# Patient Record
Sex: Female | Born: 1975 | Race: Black or African American | Hispanic: No | Marital: Single | Smoking: Current every day smoker
Health system: Southern US, Community
[De-identification: ages and names within clinical notes are randomized; demographics above are authoritative.]

## PROBLEM LIST (undated history)

## (undated) VITALS — BP 110/80 | HR 79 | Temp 97.7°F | Resp 20

## (undated) DIAGNOSIS — N179 Acute kidney failure, unspecified: Secondary | ICD-10-CM

## (undated) DIAGNOSIS — E119 Type 2 diabetes mellitus without complications: Secondary | ICD-10-CM

## (undated) DIAGNOSIS — F209 Schizophrenia, unspecified: Secondary | ICD-10-CM

## (undated) DIAGNOSIS — F319 Bipolar disorder, unspecified: Secondary | ICD-10-CM

## (undated) DIAGNOSIS — I10 Essential (primary) hypertension: Secondary | ICD-10-CM

## (undated) HISTORY — DX: Acute kidney failure, unspecified: N17.9

---
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
## 2004-09-05 ENCOUNTER — Emergency Department (HOSPITAL_COMMUNITY): Admission: EM | Admit: 2004-09-05 | Discharge: 2004-09-05 | Payer: Self-pay | Admitting: Emergency Medicine

## 2006-05-11 ENCOUNTER — Ambulatory Visit: Payer: Self-pay | Admitting: Internal Medicine

## 2019-04-24 ENCOUNTER — Inpatient Hospital Stay (HOSPITAL_COMMUNITY)
Admission: AD | Admit: 2019-04-24 | Discharge: 2019-05-02 | DRG: 885 | Disposition: A | Discharge status: Home / Self Care | Payer: Medicare Other | Attending: Emergency Medicine | Admitting: Emergency Medicine

## 2019-04-24 DIAGNOSIS — F200 Paranoid schizophrenia: Secondary | ICD-10-CM | POA: Diagnosis not present

## 2019-04-24 DIAGNOSIS — I10 Essential (primary) hypertension: Secondary | ICD-10-CM | POA: Diagnosis present

## 2019-04-24 DIAGNOSIS — F209 Schizophrenia, unspecified: Secondary | ICD-10-CM | POA: Diagnosis not present

## 2019-04-24 DIAGNOSIS — F319 Bipolar disorder, unspecified: Secondary | ICD-10-CM | POA: Diagnosis present

## 2019-04-24 DIAGNOSIS — E119 Type 2 diabetes mellitus without complications: Secondary | ICD-10-CM

## 2019-04-24 DIAGNOSIS — Z20822 Contact with and (suspected) exposure to covid-19: Secondary | ICD-10-CM | POA: Diagnosis present

## 2019-04-24 DIAGNOSIS — E669 Obesity, unspecified: Secondary | ICD-10-CM

## 2019-04-24 DIAGNOSIS — F201 Disorganized schizophrenia: Secondary | ICD-10-CM | POA: Diagnosis not present

## 2019-04-24 DIAGNOSIS — R739 Hyperglycemia, unspecified: Secondary | ICD-10-CM

## 2019-04-24 DIAGNOSIS — Z794 Long term (current) use of insulin: Secondary | ICD-10-CM

## 2019-04-24 DIAGNOSIS — Z9114 Patient's other noncompliance with medication regimen: Secondary | ICD-10-CM

## 2019-04-24 DIAGNOSIS — E1165 Type 2 diabetes mellitus with hyperglycemia: Secondary | ICD-10-CM | POA: Diagnosis present

## 2019-04-24 DIAGNOSIS — E1169 Type 2 diabetes mellitus with other specified complication: Secondary | ICD-10-CM

## 2019-04-24 LAB — RESPIRATORY PANEL BY RT PCR (FLU A&B, COVID)
Influenza A by PCR: NEGATIVE
Influenza B by PCR: NEGATIVE
SARS Coronavirus 2 by RT PCR: NEGATIVE

## 2019-04-24 MED ORDER — DIPHENHYDRAMINE HCL 25 MG PO CAPS: 50.0000 mg | ORAL_CAPSULE | Freq: Once | ORAL | Status: AC

## 2019-04-24 MED ORDER — HALOPERIDOL 5 MG PO TABS: 5.0000 mg | ORAL_TABLET | Freq: Once | ORAL | Status: AC

## 2019-04-24 MED ORDER — DIPHENHYDRAMINE HCL 50 MG/ML IJ SOLN
50.0000 mg | Freq: Once | INTRAMUSCULAR | Status: AC
  Administered 2019-04-24: 50 mg via INTRAMUSCULAR
  Filled 2019-04-24: qty 1

## 2019-04-24 MED ORDER — LORAZEPAM 2 MG/ML IJ SOLN
2.0000 mg | Freq: Once | INTRAMUSCULAR | Status: AC
  Administered 2019-04-24: 2 mg via INTRAMUSCULAR
  Filled 2019-04-24: qty 1

## 2019-04-24 MED ORDER — HALOPERIDOL LACTATE 5 MG/ML IJ SOLN
5.0000 mg | Freq: Once | INTRAMUSCULAR | Status: AC
  Administered 2019-04-24: 5 mg via INTRAMUSCULAR
  Filled 2019-04-24: qty 1

## 2019-04-24 MED ORDER — LORAZEPAM 1 MG PO TABS: 2.0000 mg | ORAL_TABLET | Freq: Once | ORAL | Status: AC
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
## 2019-04-24 NOTE — Progress Notes (Signed)
Pt very agitated with staff, cursing and yelling at staff, undirectable to return to room.  NP Rashaun Dixon notified.

## 2019-04-25 ENCOUNTER — Other Ambulatory Visit: Payer: Self-pay

## 2019-04-25 ENCOUNTER — Encounter (HOSPITAL_COMMUNITY): Payer: Self-pay | Admitting: Psychiatric/Mental Health

## 2019-04-25 DIAGNOSIS — F209 Schizophrenia, unspecified: Secondary | ICD-10-CM | POA: Diagnosis present

## 2019-04-25 DIAGNOSIS — Z9114 Patient's other noncompliance with medication regimen: Secondary | ICD-10-CM | POA: Diagnosis not present

## 2019-04-25 DIAGNOSIS — F319 Bipolar disorder, unspecified: Secondary | ICD-10-CM | POA: Diagnosis present

## 2019-04-25 DIAGNOSIS — Z794 Long term (current) use of insulin: Secondary | ICD-10-CM | POA: Diagnosis not present

## 2019-04-25 DIAGNOSIS — Z20822 Contact with and (suspected) exposure to covid-19: Secondary | ICD-10-CM | POA: Diagnosis present

## 2019-04-25 DIAGNOSIS — I10 Essential (primary) hypertension: Secondary | ICD-10-CM | POA: Diagnosis present

## 2019-04-25 DIAGNOSIS — E1165 Type 2 diabetes mellitus with hyperglycemia: Secondary | ICD-10-CM | POA: Diagnosis present

## 2019-04-25 DIAGNOSIS — F201 Disorganized schizophrenia: Secondary | ICD-10-CM | POA: Diagnosis not present

## 2019-04-25 DIAGNOSIS — F200 Paranoid schizophrenia: Secondary | ICD-10-CM | POA: Diagnosis present

## 2019-04-25 MED ORDER — ACETAMINOPHEN 325 MG PO TABS
650.0000 mg | ORAL_TABLET | Freq: Four times a day (QID) | ORAL | Status: DC | PRN
  Administered 2019-04-26: 650 mg via ORAL
  Filled 2019-04-25: qty 2

## 2019-04-25 MED ORDER — MAGNESIUM HYDROXIDE 400 MG/5ML PO SUSP: 30.0000 mL | Freq: Every day | ORAL | Status: DC | PRN

## 2019-04-25 MED ORDER — ALUM & MAG HYDROXIDE-SIMETH 200-200-20 MG/5ML PO SUSP: 30.0000 mL | ORAL | Status: DC | PRN

## 2019-04-25 MED ORDER — TEMAZEPAM 30 MG PO CAPS
30.0000 mg | ORAL_CAPSULE | Freq: Every day | ORAL | Status: DC
  Administered 2019-04-28 – 2019-05-01 (×5): 30 mg via ORAL
  Filled 2019-04-25 (×6): qty 1

## 2019-04-25 MED ORDER — DIVALPROEX SODIUM 125 MG PO CSDR
250.0000 mg | DELAYED_RELEASE_CAPSULE | Freq: Three times a day (TID) | ORAL | Status: DC
  Administered 2019-04-28 (×2): 250 mg via ORAL
  Filled 2019-04-25 (×16): qty 2

## 2019-04-25 MED ORDER — LORAZEPAM 1 MG PO TABS
2.0000 mg | ORAL_TABLET | ORAL | Status: DC | PRN
  Administered 2019-04-28: 2 mg via ORAL
  Filled 2019-04-25: qty 2

## 2019-04-25 MED ORDER — ZIPRASIDONE MESYLATE 20 MG IM SOLR
20.0000 mg | Freq: Once | INTRAMUSCULAR | Status: AC
  Administered 2019-04-25: 20 mg via INTRAMUSCULAR
  Filled 2019-04-25 (×2): qty 20

## 2019-04-25 MED ORDER — HALOPERIDOL 5 MG PO TABS
10.0000 mg | ORAL_TABLET | Freq: Two times a day (BID) | ORAL | Status: DC
  Filled 2019-04-25 (×9): qty 2

## 2019-04-25 MED ORDER — LORAZEPAM 2 MG/ML IJ SOLN
2.0000 mg | INTRAMUSCULAR | Status: DC | PRN
  Administered 2019-04-25: 2 mg via INTRAMUSCULAR

## 2019-04-25 MED ORDER — TRAZODONE HCL 50 MG PO TABS: 50.0000 mg | ORAL_TABLET | Freq: Every evening | ORAL | Status: DC | PRN

## 2019-04-25 MED ORDER — BENZTROPINE MESYLATE 1 MG PO TABS
1.0000 mg | ORAL_TABLET | Freq: Two times a day (BID) | ORAL | Status: DC
  Filled 2019-04-25 (×7): qty 1
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
## 2019-04-25 NOTE — BHH Suicide Risk Assessment (Signed)
[facility] Admission Suicide Risk Assessment   Nursing information obtained from:  Patient, Review of record Demographic factors:  NA Current Mental Status:  Plan to harm others Loss Factors:  NA Historical Factors:  Impulsivity Risk Reduction Factors:  Positive therapeutic relationship, Employed, Positive social support, Sense of responsibility to family  Total Time spent with patient: 45 minutes Principal Problem: Schizophrenia (HCC) Diagnosis:  Principal Problem:   Schizophrenia (HCC)  Subjective Data: This is the first admission at our facility but the latest of numerous for [name] who requires inpatient stabilization due to dangerousness towards others, noncompliance with medications for schizophrenia despite a court order in [place] for long-acting injectable medications.  Continued Clinical Symptoms:  Alcohol Use Disorder Identification Test Final Score (AUDIT): 1 The "Alcohol Use Disorders Identification Test", Guidelines for Use in Primary Care, Second Edition.  World Science writer [facility]). Score between 0-7:  no or low risk or alcohol related problems. Score between 8-15:  moderate risk of alcohol related problems. Score between 16-19:  high risk of alcohol related problems. Score 20 or above:  warrants further diagnostic evaluation for alcohol dependence and treatment.   CLINICAL FACTORS:   Schizophrenia:   Less than 24 years old Currently Psychotic Unstable or Poor Therapeutic Relationship Musculoskeletal: Strength & Muscle Tone: within normal limits Gait & Station: normal Patient leans: N/A  Psychiatric Specialty Exam: Physical Exam  Nursing note and vitals reviewed. Constitutional: She appears well-developed and well-nourished.    Review of Systems  Constitutional: Negative.   Eyes: Negative.   Respiratory: Negative.   Cardiovascular: Negative.   Endocrine: Negative.   Genitourinary: Negative.   Allergic/Immunologic: Negative.   Neurological: Negative.      Blood pressure 122/77, pulse (!) 116, temperature 98.2 F (36.8 C), temperature source Oral, resp. rate 16, SpO2 99 %.There is no height or weight on file to calculate BMI.  General Appearance: Casual  Eye Contact:  Fair  Speech:  Clear and Coherent  Volume:  Increased  Mood:  Angry, Irritable and Hypomanic and unable to be redirected  Affect:  Labile  Thought Process:  Irrelevant and Descriptions of Associations: Loose  Orientation:  Full (Time, Place, and Person)  Thought Content:  Illogical, Delusions and Hallucinations: Auditory are likely but patient denies.  Suicidal Thoughts:  No  Homicidal Thoughts:  No  Memory:  Immediate;   Fair Recent;   Fair Remote;   Fair  Judgement:  Impaired  Insight:  Shallow  Psychomotor Activity:  Normal  Concentration:  Concentration: Fair and Attention Span: Fair  Recall:  [employer] of Knowledge:  Good  Language:  Good  Akathisia:  Negative  Handed:  Right  AIMS (if indicated):     Assets:  Communication Skills Housing Leisure Time Physical Health Resilience Social Support  ADL's:  Intact  Cognition:  WNL  Sleep:       COGNITIVE FEATURES THAT CONTRIBUTE TO RISK:  Closed-mindedness and Loss of executive function    SUICIDE RISK:   Minimal: No identifiable suicidal ideation.  Patients presenting with no risk factors but with morbid ruminations; may be classified as minimal risk based on the severity of the depressive symptoms  PLAN OF CARE: see eval  I certify that inpatient services furnished can reasonably be expected to improve the patient's condition.   Malvin Johns, MD 04/25/2019, 12:01 PM
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
## 2019-04-25 NOTE — Progress Notes (Signed)
Pt transferred to 500 hall. Verbal report given to receiving nurse Caroleen Hamman, RN. Pt ambulatory with a steady gait, appears to be in no physical distress. Pt escorted to unit by assigned MHTs and was cooperative.
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
## 2019-04-25 NOTE — BH Assessment (Signed)
[facility] Assessment Progress Note  Per Kimberly Johns, MD, this pt requires psychiatric hospitalization.  Kimberly Orozco has assigned pt to Kimberly Orozco Rm 507-1.  Pt presents under IVC initiated by pt's mother, and upheld by Dr Jeannine Kitten, and IVC documents have been sent to [facility].  Pt's nurse, Kimberly Orozco, has been notified.   Kimberly Orozco, [place] Behavioral Health Coordinator [phone]

## 2019-04-25 NOTE — Progress Notes (Signed)
[place] NOVEL CORONAVIRUS (COVID-19) DAILY CHECK-OFF SYMPTOMS - answer yes or no to each - every day NO YES  Have you had a fever in the past 24 hours?  . Fever (Temp > 37.80C / 100F) X   Have you had any of these symptoms in the past 24 hours? . New Cough .  Sore Throat  .  Shortness of Breath .  Difficulty Breathing .  Unexplained Body Aches   X   Have you had any one of these symptoms in the past 24 hours not related to allergies?   . Runny Nose .  Nasal Congestion .  Sneezing   X   If you have had runny nose, nasal congestion, sneezing in the past 24 hours, has it worsened?  X   EXPOSURES - check yes or no X   Have you traveled outside the state in the past 14 days?  X   Have you been in contact with someone with a confirmed diagnosis of COVID-19 or PUI in the past 14 days without wearing appropriate PPE?  X   Have you been living in the same home as a person with confirmed diagnosis of COVID-19 or a PUI (household contact)?    X   Have you been diagnosed with COVID-19?    X              What to do next: Answered NO to all: Answered YES to anything:   Proceed with unit schedule Follow the [facility] Inpatient Flowsheet.   Kamarri Fischetti K. Shanyce Daris MSN, RN, [facility] [phone] 
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
## 2019-04-25 NOTE — Discharge Summary (Signed)
Patient admitted to [facility] bed 507-01

## 2019-04-25 NOTE — Progress Notes (Signed)
Spirituality group facilitated by Wilkie Aye, MDiv, B[facility].  Group focused on the topic of "hope."   Patients were led in a facilitated discussion, creating a word cloud to respond to the prompt "Hope is."   Patients named experiences both past and present that inform their understanding of hope  Patients engaged in a visual explorer activity - choosing a photo that represented hope for today.    Did not attend

## 2019-04-26 DIAGNOSIS — F319 Bipolar disorder, unspecified: Secondary | ICD-10-CM | POA: Diagnosis present

## 2019-04-26 LAB — URINALYSIS, COMPLETE (UACMP) WITH MICROSCOPIC
Bacteria, UA: NONE SEEN
Bilirubin Urine: NEGATIVE
Glucose, UA: >=500 mg/dL — AB
Hgb urine dipstick: NEGATIVE
Ketones, ur: 20 mg/dL — AB
Leukocytes,Ua: NEGATIVE
Nitrite: NEGATIVE
Protein, ur: NEGATIVE mg/dL
Specific Gravity, Urine: 1.031 — ABNORMAL HIGH (ref 1.005–1.030)
pH: 5 (ref 5.0–8.0)

## 2019-04-26 LAB — RAPID URINE DRUG SCREEN, HOSP PERFORMED
Amphetamines: NOT DETECTED
Barbiturates: NOT DETECTED
Benzodiazepines: NOT DETECTED
Cocaine: NOT DETECTED
Opiates: NOT DETECTED
Tetrahydrocannabinol: NOT DETECTED

## 2019-04-26 LAB — PREGNANCY, URINE: Preg Test, Ur: NEGATIVE

## 2019-04-26 MED ORDER — DIPHENHYDRAMINE HCL 50 MG PO CAPS
50.0000 mg | ORAL_CAPSULE | Freq: Once | ORAL | Status: AC
  Administered 2019-04-26: 50 mg via ORAL
  Filled 2019-04-26: qty 2
  Filled 2019-04-26: qty 1

## 2019-04-26 MED ORDER — OLANZAPINE 10 MG PO TBDP: 10.0000 mg | ORAL_TABLET | Freq: Three times a day (TID) | ORAL | Status: DC | PRN

## 2019-04-26 MED ORDER — LORAZEPAM 1 MG PO TABS: 1.0000 mg | ORAL_TABLET | ORAL | Status: DC | PRN

## 2019-04-26 MED ORDER — ZIPRASIDONE MESYLATE 20 MG IM SOLR
20.0000 mg | INTRAMUSCULAR | Status: DC | PRN
  Filled 2019-04-26: qty 20

## 2019-04-26 NOTE — Progress Notes (Signed)
   04/26/19 1300  Psych Admission Type (Psych Patients Only)  Admission Status Involuntary  Psychosocial Assessment  Patient Complaints Anxiety  Eye Contact Intense;Suspiciousness  Facial Expression Angry  Affect Anxious;Irritable  Speech Loud;Pressured;Sports administrator;Restless  Appearance/Hygiene Disheveled  Behavior Characteristics Irritable  Mood Anxious;Irritable  Aggressive Behavior  Targets Other (Comment) (staff)  Type of Behavior Verbal  Effect No apparent injury  Thought Process  Coherency Tangential  Content Blaming others;Paranoia;Delusions  Delusions Persecutory  Perception Derealization  Hallucination None reported or observed  Judgment Poor  Confusion None  Danger to Self  Current suicidal ideation? Denies  Danger to Others  Danger to Others None reported or observed  Danger to Others Abnormal  Harmful Behavior to others No threats or harm toward other people (At this time. )  Destructive Behavior No threats or harm toward property
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
## 2019-04-26 NOTE — BHH Group Notes (Signed)
Adult Psychoeducational Group Note  Date:  04/26/2019 Time:  12:49 PM  Group Topic/Focus:  Goals Group:   The focus of this group is to help patients establish daily goals to achieve during treatment and discuss how the patient can incorporate goal setting into their daily lives to aide in recovery.  Participation Level:  Did Not Attend  Dione Housekeeper 04/26/2019, 12:49 PM
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
## 2019-04-26 NOTE — Progress Notes (Signed)
Pt refused nighttime medications. Pt educated on what the medication does. Pt states that she is not psychotic. "Y'all don't know me. I am not psychotic and I don't need these medications. That medicine y'all gave me earlier (Geodon) was too strong. I was talking on the phone and couldn't move my mouth. I'm not crazy and I shouldn't be here. Y'all don't do stuff right here. There should not be a man near a woman when she needs to be restrained. Women for women and men for men." Pt went on like this. This Clinical research associate told pt that she would note pt's objections to meds and the way she was treated. Pt wants her side of the story heard as well.

## 2019-04-26 NOTE — B[facility] Group Notes (Signed)
[facility] Group Notes: (Clinical Social Work)   04/26/2019      Type of Therapy:  Group Therapy   Participation Level:  Did Not Attend - was invited both individually by MHT and by overhead announcement, chose not to attend.   Izaya Netherton Grossman-Orr, LCSW 04/26/2019, 12:08 PM    

## 2019-04-27 ENCOUNTER — Encounter (HOSPITAL_COMMUNITY): Payer: Self-pay | Admitting: Psychiatry

## 2019-04-27 DIAGNOSIS — F200 Paranoid schizophrenia: Principal | ICD-10-CM

## 2019-04-27 DIAGNOSIS — E669 Obesity, unspecified: Secondary | ICD-10-CM

## 2019-04-27 DIAGNOSIS — E1169 Type 2 diabetes mellitus with other specified complication: Secondary | ICD-10-CM

## 2019-04-27 LAB — CBC WITH DIFFERENTIAL/PLATELET
Abs Immature Granulocytes: 0.04 10*3/uL (ref 0.00–0.07)
Basophils Absolute: 0 10*3/uL (ref 0.0–0.1)
Basophils Relative: 0 %
Eosinophils Absolute: 0.1 10*3/uL (ref 0.0–0.5)
Eosinophils Relative: 1 %
HCT: 44 % (ref 36.0–46.0)
Hemoglobin: 13.9 g/dL (ref 12.0–15.0)
Immature Granulocytes: 0 %
Lymphocytes Relative: 15 %
Lymphs Abs: 1.6 10*3/uL (ref 0.7–4.0)
MCH: 28 pg (ref 26.0–34.0)
MCHC: 31.6 g/dL (ref 30.0–36.0)
MCV: 88.7 fL (ref 80.0–100.0)
Monocytes Absolute: 0.6 10*3/uL (ref 0.1–1.0)
Monocytes Relative: 6 %
Neutro Abs: 8.4 10*3/uL — ABNORMAL HIGH (ref 1.7–7.7)
Neutrophils Relative %: 78 %
Platelets: 249 10*3/uL (ref 150–400)
RBC: 4.96 MIL/uL (ref 3.87–5.11)
RDW: 13.8 % (ref 11.5–15.5)
WBC: 10.7 10*3/uL — ABNORMAL HIGH (ref 4.0–10.5)
nRBC: 0 % (ref 0.0–0.2)

## 2019-04-27 LAB — URINALYSIS, ROUTINE W REFLEX MICROSCOPIC
Bacteria, UA: NONE SEEN
Bilirubin Urine: NEGATIVE
Glucose, UA: >=500 mg/dL — AB
Hgb urine dipstick: NEGATIVE
Ketones, ur: 20 mg/dL — AB
Nitrite: NEGATIVE
Protein, ur: NEGATIVE mg/dL
Specific Gravity, Urine: 1.03 (ref 1.005–1.030)
pH: 6 (ref 5.0–8.0)

## 2019-04-27 LAB — BASIC METABOLIC PANEL
Anion gap: 11 (ref 5–15)
BUN: 18 mg/dL (ref 6–20)
CO2: 23 mmol/L (ref 22–32)
Calcium: 8.7 mg/dL — ABNORMAL LOW (ref 8.9–10.3)
Chloride: 97 mmol/L — ABNORMAL LOW (ref 98–111)
Creatinine, Ser: 0.92 mg/dL (ref 0.44–1.00)
GFR calc Af Amer: >60 mL/min (ref >60)
GFR calc non Af Amer: >60 mL/min (ref >60)
Glucose, Bld: 751 mg/dL (ref 70–99)
Potassium: 4.7 mmol/L (ref 3.5–5.1)
Sodium: 131 mmol/L — ABNORMAL LOW (ref 135–145)

## 2019-04-27 LAB — MAGNESIUM: Magnesium: 2.3 mg/dL (ref 1.7–2.4)

## 2019-04-27 LAB — GLUCOSE, CAPILLARY: Glucose-Capillary: >600 mg/dL (ref 70–99)

## 2019-04-27 LAB — CBG MONITORING, ED
Glucose-Capillary: >600 mg/dL (ref 70–99)
Glucose-Capillary: 527 mg/dL (ref 70–99)
Glucose-Capillary: 342 mg/dL — ABNORMAL HIGH (ref 70–99)
Glucose-Capillary: 323 mg/dL — ABNORMAL HIGH (ref 70–99)
Glucose-Capillary: 315 mg/dL — ABNORMAL HIGH (ref 70–99)
Glucose-Capillary: 293 mg/dL — ABNORMAL HIGH (ref 70–99)

## 2019-04-27 LAB — RESPIRATORY PANEL BY RT PCR (FLU A&B, COVID)
Influenza A by PCR: NEGATIVE
Influenza B by PCR: NEGATIVE
SARS Coronavirus 2 by RT PCR: NEGATIVE

## 2019-04-27 LAB — HEMOGLOBIN A1C
Hgb A1c MFr Bld: 14.4 % — ABNORMAL HIGH (ref 4.8–5.6)
Mean Plasma Glucose: 366.58 mg/dL

## 2019-04-27 LAB — I-STAT BETA HCG BLOOD, ED (MC, WL, AP ONLY): I-stat hCG, quantitative: <5 m[IU]/mL (ref <5)

## 2019-04-27 MED ORDER — SODIUM CHLORIDE 0.9 % IV BOLUS
1000.0000 mL | Freq: Once | INTRAVENOUS | Status: AC
  Administered 2019-04-27: 1000 mL via INTRAVENOUS

## 2019-04-27 MED ORDER — LACTATED RINGERS IV BOLUS
2000.0000 mL | Freq: Once | INTRAVENOUS | Status: AC
  Administered 2019-04-27: 2000 mL via INTRAVENOUS

## 2019-04-27 MED ORDER — HALOPERIDOL LACTATE 5 MG/ML IJ SOLN
10.0000 mg | Freq: Two times a day (BID) | INTRAMUSCULAR | Status: DC
  Filled 2019-04-27 (×4): qty 2

## 2019-04-27 MED ORDER — INSULIN ASPART 100 UNIT/ML ~~LOC~~ SOLN
0.0000 [IU] | Freq: Three times a day (TID) | SUBCUTANEOUS | Status: DC
  Administered 2019-04-28: 15 [IU] via SUBCUTANEOUS
  Administered 2019-04-28: 7 [IU] via SUBCUTANEOUS
  Administered 2019-04-28: 4 [IU] via SUBCUTANEOUS
  Administered 2019-04-29: 20 [IU] via SUBCUTANEOUS
  Administered 2019-04-29: 11 [IU] via SUBCUTANEOUS
  Administered 2019-04-30 (×2): 15 [IU] via SUBCUTANEOUS
  Administered 2019-04-30: 11 [IU] via SUBCUTANEOUS
  Administered 2019-05-01 (×2): 20 [IU] via SUBCUTANEOUS
  Administered 2019-05-02 (×2): 15 [IU] via SUBCUTANEOUS
  Filled 2019-04-27: qty 0.2

## 2019-04-27 MED ORDER — METFORMIN HCL ER 500 MG PO TB24
500.0000 mg | ORAL_TABLET | Freq: Every day | ORAL | Status: DC
  Administered 2019-04-27 – 2019-05-01 (×4): 500 mg via ORAL
  Filled 2019-04-27: qty 7
  Filled 2019-04-27 (×6): qty 1

## 2019-04-27 MED ORDER — HALOPERIDOL 5 MG PO TABS
10.0000 mg | ORAL_TABLET | Freq: Two times a day (BID) | ORAL | Status: DC
  Administered 2019-04-28: 10 mg via ORAL
  Filled 2019-04-27 (×4): qty 2

## 2019-04-27 MED ORDER — INSULIN ASPART 100 UNIT/ML ~~LOC~~ SOLN
4.0000 [IU] | Freq: Three times a day (TID) | SUBCUTANEOUS | Status: DC
  Administered 2019-04-28 – 2019-05-02 (×12): 4 [IU] via SUBCUTANEOUS
  Filled 2019-04-27: qty 0.04

## 2019-04-27 MED ORDER — INSULIN GLARGINE 100 UNIT/ML ~~LOC~~ SOLN
10.0000 [IU] | Freq: Once | SUBCUTANEOUS | Status: AC
  Administered 2019-04-27: 10 [IU] via SUBCUTANEOUS
  Filled 2019-04-27: qty 0.1

## 2019-04-27 MED ORDER — ZIPRASIDONE MESYLATE 20 MG IM SOLR
20.0000 mg | Freq: Once | INTRAMUSCULAR | Status: AC
  Administered 2019-04-27: 20 mg via INTRAMUSCULAR
  Filled 2019-04-27: qty 20

## 2019-04-27 MED ORDER — INSULIN ASPART 100 UNIT/ML ~~LOC~~ SOLN
0.0000 [IU] | Freq: Every day | SUBCUTANEOUS | Status: DC
  Administered 2019-04-27 – 2019-04-28 (×2): 3 [IU] via SUBCUTANEOUS
  Administered 2019-04-29: 5 [IU] via SUBCUTANEOUS
  Administered 2019-04-30: 2 [IU] via SUBCUTANEOUS
  Administered 2019-05-01: 4 [IU] via SUBCUTANEOUS
  Filled 2019-04-27: qty 0.05

## 2019-04-27 MED ORDER — INSULIN ASPART 100 UNIT/ML IV SOLN
10.0000 [IU] | Freq: Once | INTRAVENOUS | Status: AC
  Administered 2019-04-27: 10 [IU] via INTRAVENOUS
  Filled 2019-04-27: qty 0.1

## 2019-04-27 MED ORDER — DIPHENHYDRAMINE HCL 25 MG PO CAPS
50.0000 mg | ORAL_CAPSULE | ORAL | Status: DC | PRN
  Administered 2019-05-01 – 2019-05-02 (×2): 50 mg via ORAL
  Filled 2019-04-27 (×2): qty 2

## 2019-04-27 MED ORDER — ZIPRASIDONE MESYLATE 20 MG IM SOLR: 20.0000 mg | Freq: Once | INTRAMUSCULAR | Status: DC

## 2019-04-27 MED ORDER — INSULIN ASPART 100 UNIT/ML ~~LOC~~ SOLN
10.0000 [IU] | Freq: Once | SUBCUTANEOUS | Status: AC
  Administered 2019-04-27: 10 [IU] via INTRAVENOUS
  Filled 2019-04-27: qty 0.1

## 2019-04-27 MED ORDER — ZIPRASIDONE MESYLATE 20 MG IM SOLR
INTRAMUSCULAR | Status: AC
  Filled 2019-04-27: qty 20

## 2019-04-27 NOTE — ED Provider Notes (Signed)
[facility]-EMERGENCY DEPT Provider Note   CSN: [id number] Arrival date & time: 04/27/19  1324     History Chief Complaint  Patient presents with  . Schizophrenia  . Hyperglycemia    Kimberly Orozco is a 44 y.o. female.  HPI    33yF with hyperglycemia. Sent from [facility] for further evaluation. She was there for psychiatric reasons. Glucose checked and over 600. She tells me she feels fine. She says she understands why she is in the ER but doesn't like the fact that she is under IVC and doesn't want to be here. She denies history of diabetes. She reports she has never been on medication for diabetes. Denies any acute pain. No fever. She feels thirsty. Denies polyuria.   History reviewed. No pertinent past medical history.  Patient Active Problem List   Diagnosis Date Noted  . Bipolar disorder (HCC) 04/26/2019  . Schizophrenia (HCC) 04/25/2019    History reviewed. No pertinent surgical history.   OB History   No obstetric history on file.     History reviewed. No pertinent family history.  Social History   Tobacco Use  . Smoking status: Never Smoker  . Smokeless tobacco: Never Used  Substance Use Topics  . Alcohol use: Not on file  . Drug use: Not on file    Home Medications Prior to Admission medications   Medication Sig Start Date End Date Taking? Authorizing Provider  acetaminophen (TYLENOL) 325 MG tablet Take 650 mg by mouth every 6 (six) hours as needed for mild pain or headache.   Yes [provider]    Allergies    Patient has no known allergies.  Review of Systems   Review of Systems All systems reviewed and negative, other than as noted in HPI.  Physical Exam Updated Vital Signs BP (!) 142/90 (BP Location: Right Arm)   Pulse (!) 113   Temp 98.2 F (36.8 C) (Oral)   Resp 16   SpO2 99%   Physical Exam Vitals and nursing note reviewed.  Constitutional:      General: She is not in acute distress.    Appearance: She  is well-developed.  HENT:     Head: Normocephalic and atraumatic.  Eyes:     General:        Right eye: No discharge.        Left eye: No discharge.     Conjunctiva/sclera: Conjunctivae normal.  Cardiovascular:     Rate and Rhythm: Normal rate and regular rhythm.     Heart sounds: Normal heart sounds. No murmur. No friction rub. No gallop.   Pulmonary:     Effort: Pulmonary effort is normal. No respiratory distress.     Breath sounds: Normal breath sounds.  Abdominal:     General: There is no distension.     Palpations: Abdomen is soft.     Tenderness: There is no abdominal tenderness.  Musculoskeletal:        General: No tenderness.     Cervical back: Neck supple.  Skin:    General: Skin is warm and dry.  Neurological:     Mental Status: She is alert.  Psychiatric:     Comments: Hypervigilant and somewhat paranoid. She was cooperative though.      ED Results / Procedures / Treatments   Labs (all labs ordered are listed, but only abnormal results are displayed) Labs Reviewed  URINALYSIS, COMPLETE (UACMP) WITH MICROSCOPIC - Abnormal; Notable for the following components:  Result Value   Color, Urine COLORLESS (*)    Specific Gravity, Urine 1.031 (*)    Glucose, UA >=500 (*)    Ketones, ur 20 (*)    All other components within normal limits  GLUCOSE, CAPILLARY - Abnormal; Notable for the following components:   Glucose-Capillary >600 (*)    All other components within normal limits  CBC WITH DIFFERENTIAL/PLATELET - Abnormal; Notable for the following components:   WBC 10.7 (*)    Neutro Abs 8.4 (*)    All other components within normal limits  BASIC METABOLIC PANEL - Abnormal; Notable for the following components:   Sodium 131 (*)    Chloride 97 (*)    Glucose, Bld 751 (*)    Calcium 8.7 (*)    All other components within normal limits  URINALYSIS, ROUTINE W REFLEX MICROSCOPIC - Abnormal; Notable for the following components:   Color, Urine STRAW (*)     Glucose, UA >=500 (*)    Ketones, ur 20 (*)    Leukocytes,Ua SMALL (*)    All other components within normal limits  HEMOGLOBIN A1C - Abnormal; Notable for the following components:   Hgb A1c MFr Bld 14.4 (*)    All other components within normal limits  GLUCOSE, CAPILLARY - Abnormal; Notable for the following components:   Glucose-Capillary 373 (*)    All other components within normal limits  GLUCOSE, CAPILLARY - Abnormal; Notable for the following components:   Glucose-Capillary 350 (*)    All other components within normal limits  CBG MONITORING, ED - Abnormal; Notable for the following components:   Glucose-Capillary >600 (*)    All other components within normal limits  CBG MONITORING, ED - Abnormal; Notable for the following components:   Glucose-Capillary 527 (*)    All other components within normal limits  CBG MONITORING, ED - Abnormal; Notable for the following components:   Glucose-Capillary 342 (*)    All other components within normal limits  CBG MONITORING, ED - Abnormal; Notable for the following components:   Glucose-Capillary 323 (*)    All other components within normal limits  CBG MONITORING, ED - Abnormal; Notable for the following components:   Glucose-Capillary 315 (*)    All other components within normal limits  CBG MONITORING, ED - Abnormal; Notable for the following components:   Glucose-Capillary 293 (*)    All other components within normal limits  RESPIRATORY PANEL BY RT PCR (FLU A&B, COVID)  RESPIRATORY PANEL BY RT PCR (FLU A&B, COVID)  PREGNANCY, URINE  RAPID URINE DRUG SCREEN, HOSP PERFORMED  MAGNESIUM  CBC WITH DIFFERENTIAL/PLATELET  URINALYSIS, COMPLETE (UACMP) WITH MICROSCOPIC  I-STAT BETA HCG BLOOD, ED (MC, [facility], AP ONLY)    EKG None  Radiology No results found.  Procedures Procedures (including critical care time)  Medications Ordered in ED Medications  acetaminophen (TYLENOL) tablet 650 mg (650 mg Oral Given 04/26/19 0854)  alum  & mag hydroxide-simeth (MAALOX/MYLANTA) 200-200-20 MG/5ML suspension 30 mL (has no administration in time range)  magnesium hydroxide (MILK OF MAGNESIA) suspension 30 mL (has no administration in time range)  traZODone (DESYREL) tablet 50 mg (has no administration in time range)  temazepam (RESTORIL) capsule 30 mg (30 mg Oral Not Given 04/26/19 2134)  divalproex (DEPAKOTE SPRINKLE) capsule 250 mg (250 mg Oral Not Given 04/27/19 0551)  LORazepam (ATIVAN) tablet 2 mg ( Oral See Alternative 04/25/19 1311)    Or  LORazepam (ATIVAN) injection 2 mg (2 mg Intramuscular Given 04/25/19 1311)  OLANZapine  zydis (ZYPREXA) disintegrating tablet 10 mg (has no administration in time range)    And  LORazepam (ATIVAN) tablet 1 mg (has no administration in time range)    And  ziprasidone (GEODON) injection 20 mg (has no administration in time range)  diphenhydrAMINE (BENADRYL) capsule 50 mg (has no administration in time range)  ziprasidone (GEODON) 20 MG injection (has no administration in time range)  haloperidol (HALDOL) tablet 10 mg (has no administration in time range)    Or  haloperidol lactate (HALDOL) injection 10 mg (has no administration in time range)  lactated ringers bolus 2,000 mL (has no administration in time range)  insulin aspart (novoLOG) injection 10 Units (has no administration in time range)  haloperidol (HALDOL) tablet 5 mg ( Oral See Alternative 04/24/19 2354)    Or  haloperidol lactate (HALDOL) injection 5 mg (5 mg Intramuscular Given 04/24/19 2354)  diphenhydrAMINE (BENADRYL) capsule 50 mg ( Oral See Alternative 04/24/19 2353)    Or  diphenhydrAMINE (BENADRYL) injection 50 mg (50 mg Intramuscular Given 04/24/19 2353)  LORazepam (ATIVAN) tablet 2 mg ( Oral See Alternative 04/24/19 2354)    Or  LORazepam (ATIVAN) injection 2 mg (2 mg Intramuscular Given 04/24/19 2354)  ziprasidone (GEODON) injection 20 mg (20 mg Intramuscular Given 04/25/19 1315)  diphenhydrAMINE (BENADRYL) capsule 50 mg (50  mg Oral Given 04/26/19 2128)  ziprasidone (GEODON) injection 20 mg (20 mg Intramuscular Given 04/27/19 1118)    ED Course  I have reviewed the triage vital signs and the nursing notes.  Pertinent labs & imaging results that were available during my care of the patient were reviewed by me and considered in my medical decision making (see chart for details).    MDM Rules/Calculators/A&P                      33yF with hyperglycemia. New diagnosis of diabetes. Seems like mild symptoms. Clinically doubt DKA but needs work-up. IVF fluids.   Glucose over 700. Not in DKA. Not currently on meds. Will improve with IVF and insulin but will discuss with hospitalist with regards to recs for further management.   Final Clinical Impression(s) / ED Diagnoses Final diagnoses:  Hyperglycemia  New onset type 2 diabetes mellitus [facility])    Rx / DC Orders ED Discharge Orders    None       Raeford Razor, MD 04/28/19 [phone]

## 2019-04-27 NOTE — Progress Notes (Signed)
Patient ID: Kimberly Orozco, female   DOB: 01/07/1986, 44 y.o.   MRN: [id number]  Patient is seen and examined.  Progress note to be finished later today.  Patient agreed to urinalysis this morning and showed significant glucosuria.  I discussed with the patient whether or not she had a history of diabetes.  She stated no.  I asked her why she had refused laboratories this morning, and she stated that "in [place] they draw tubes and tubes of blood, I am not going to do it here".  She did agree to an Accu-Chek.  Her blood sugar was at least greater than 600.  It registered high.  The patient refuses any medications or further evaluation of this.  She is currently agitated and aggressive.  We will have to medicate her prior to transport to the emergency room to attempt to get her blood sugar under control.  She clearly is not agreeing to any insulin or anything here that could potentially assist with her significantly elevated blood sugar.  It is in the best interest and safety of the patient to medicate her so that she can receive other laboratories and treatment for hyperglycemia in the emergency department.

## 2019-04-27 NOTE — Consult Note (Addendum)
Medical Consultation   Kimberly Orozco  UXN:[id number]  DOB: 09/17/1975  DOA: 04/24/2019  PCP: Patient, No Pcp Per   Requesting physician: Ezequiel Essex, MD  Reason for consultation: New Diagnosis of Type 2 DM   History of Present Illness: Kimberly Orozco is an 44 y.o. female transferred from [facility] today. She is involuntary comitted there and undergoing treatment for schizophrenia. Had CBG done there and noted to have CBG > 600. We are asked to see her for management of this. Patient denies significant polyuria or polydipsia. Denies h/o diabetes. She strongly desires to return to [facility] and not stay here. She also strongly desires not to be on insulin though was willing to take it to try to get her CBGs controlled. Received IV insulin 10 U and CBG dropped from 751-->527. Added Lantus 10 and another 10 u IV insulin and started on Glucophage XR 500 daily. Denies CP/SOB/fever/chills/Nausea/Vomiting/Headache.  Review of Systems:  As per HPI otherwise 10 point review of systems negative.   Past Medical History: History reviewed. No pertinent past medical history.  Past Surgical History: History reviewed. No pertinent surgical history.  Allergies:  No Known Allergies  Social History:  reports that she has never smoked. She has never used smokeless tobacco. No history on file for alcohol and drug.  Family History: History reviewed. No pertinent family history.  Physical Exam: Vitals:   04/25/19 0920 04/25/19 0925 04/27/19 1100 04/27/19 1430  BP: 136/82 122/77 (!) 142/90 (!) 157/99  Pulse: (!) 108 (!) 116 (!) 113 (!) 114  Resp: 16 16 16  (!) 32  Temp: 98.2 F (36.8 C)  98.2 F (36.8 C)   TempSrc: Oral  Oral   SpO2: 100% 99% 99% 97%    Constitutional:   Alert and awake, oriented x3, not in any acute distress. Eyes:  EOMI, irises appear normal, anicteric sclera,  ENMT: external ears and nose appear normal, normal hearing             Lips appears normal Neck: neck  appears normal, no masses, normal ROM CVS: S1-S2 clear, no murmur rubs or gallops, no LE edema, normal pedal pulses  Respiratory:  clear to auscultation bilaterally, no wheezing, rales or rhonchi. Respiratory effort normal. No accessory muscle use.  Abdomen: soft nontender, nondistended, normal bowel sounds, no hepatosplenomegaly, no hernias  Musculoskeletal: : no cyanosis, clubbing or edema noted bilaterally Neuro: Normal strength, sensation Psych: hypervigilant, cooperative Skin: no rashes or lesions or ulcers, no induration or nodules   Data reviewed:  I have personally reviewed following labs and imaging studies Labs:  CBC: Recent Labs  Lab 04/27/19 1417  WBC 10.7*  NEUTROABS 8.4*  HGB 13.9  HCT 44.0  MCV 88.7  PLT 254    Basic Metabolic Panel: Recent Labs  Lab 04/27/19 1417  NA 131*  K 4.7  CL 97*  CO2 23  GLUCOSE 751*  BUN 18  CREATININE 0.92  CALCIUM 8.7*  MG 2.3   CBG: Recent Labs  Lab 04/27/19 1104 04/27/19 1412 04/27/19 1628  GLUCAP >600* >600* 527*   Urinalysis    Component Value Date/Time   COLORURINE STRAW (A) 04/27/2019 1342   APPEARANCEUR CLEAR 04/27/2019 1342   LABSPEC 1.030 04/27/2019 1342   PHURINE 6.0 04/27/2019 1342   GLUCOSEU >=500 (A) 04/27/2019 1342   HGBUR NEGATIVE 04/27/2019 1342   BILIRUBINUR NEGATIVE 04/27/2019 1342   KETONESUR 20 (A) 04/27/2019 1342   PROTEINUR  NEGATIVE 04/27/2019 1342   NITRITE NEGATIVE 04/27/2019 1342   LEUKOCYTESUR SMALL (A) 04/27/2019 1342   Sepsis Labs Microbiology Recent Results (from the past 240 hour(s))  Respiratory Panel by RT PCR (Flu A&B, Covid) - Nasopharyngeal Swab     Status: None   Collection Time: 04/24/19  9:28 PM   Specimen: Nasopharyngeal Swab  Result Value Ref Range Status   SARS Coronavirus 2 by RT PCR NEGATIVE NEGATIVE Final    Comment: (NOTE) SARS-CoV-2 target nucleic acids are NOT DETECTED. The SARS-CoV-2 RNA is generally detectable in upper respiratoy specimens during the  acute phase of infection. The lowest concentration of SARS-CoV-2 viral copies this assay can detect is 131 copies/mL. A negative result does not preclude SARS-Cov-2 infection and should not be used as the sole basis for treatment or other patient management decisions. A negative result may occur with  improper specimen collection/handling, submission of specimen other than nasopharyngeal swab, presence of viral mutation(s) within the areas targeted by this assay, and inadequate number of viral copies (<131 copies/mL). A negative result must be combined with clinical observations, patient history, and epidemiological information. The expected result is Negative. Fact Sheet for Patients:  https://www.moore.com/ Fact Sheet for Healthcare Providers:  https://www.young.biz/ This test is not yet ap proved or cleared by the [place] FDA and  has been authorized for detection and/or diagnosis of SARS-CoV-2 by FDA under an Emergency Use Authorization (EUA). This EUA will remain  in effect (meaning this test can be used) for the duration of the COVID-19 declaration under Section 564(b)(1) of the Act, 21 U.S.C. section 360bbb-3(b)(1), unless the authorization is terminated or revoked sooner.    Influenza A by PCR NEGATIVE NEGATIVE Final   Influenza B by PCR NEGATIVE NEGATIVE Final    Comment: (NOTE) The Xpert Xpress SARS-CoV-2/FLU/RSV assay is intended as an aid in  the diagnosis of influenza from Nasopharyngeal swab specimens and  should not be used as a sole basis for treatment. Nasal washings and  aspirates are unacceptable for Xpert Xpress SARS-CoV-2/FLU/RSV  testing. Fact Sheet for Patients: https://www.moore.com/ Fact Sheet for Healthcare Providers: https://www.young.biz/ This test is not yet approved or cleared by the [place] FDA and  has been authorized for detection and/or diagnosis of SARS-CoV-2  by  FDA under an Emergency Use Authorization (EUA). This EUA will remain  in effect (meaning this test can be used) for the duration of the  Covid-19 declaration under Section 564(b)(1) of the Act, 21  U.S.C. section 360bbb-3(b)(1), unless the authorization is  terminated or revoked. Performed at [facility], [address] [place], [place] [address]   Respiratory Panel by RT PCR (Flu A&B, Covid) - Nasopharyngeal Swab     Status: None   Collection Time: 04/27/19  3:20 PM   Specimen: Nasopharyngeal Swab  Result Value Ref Range Status   SARS Coronavirus 2 by RT PCR NEGATIVE NEGATIVE Final    Comment: (NOTE) SARS-CoV-2 target nucleic acids are NOT DETECTED. The SARS-CoV-2 RNA is generally detectable in upper respiratoy specimens during the acute phase of infection. The lowest concentration of SARS-CoV-2 viral copies this assay can detect is 131 copies/mL. A negative result does not preclude SARS-Cov-2 infection and should not be used as the sole basis for treatment or other patient management decisions. A negative result may occur with  improper specimen collection/handling, submission of specimen other than nasopharyngeal swab, presence of viral mutation(s) within the areas targeted by this assay, and inadequate number of viral copies (<131 copies/mL).  A negative result must be combined with clinical observations, patient history, and epidemiological information. The expected result is Negative. Fact Sheet for Patients:  https://www.moore.com/ Fact Sheet for Healthcare Providers:  https://www.young.biz/ This test is not yet ap proved or cleared by the [place] FDA and  has been authorized for detection and/or diagnosis of SARS-CoV-2 by FDA under an Emergency Use Authorization (EUA). This EUA will remain  in effect (meaning this test can be used) for the duration of the COVID-19 declaration under Section 564(b)(1) of the  Act, 21 U.S.C. section 360bbb-3(b)(1), unless the authorization is terminated or revoked sooner.    Influenza A by PCR NEGATIVE NEGATIVE Final   Influenza B by PCR NEGATIVE NEGATIVE Final    Comment: (NOTE) The Xpert Xpress SARS-CoV-2/FLU/RSV assay is intended as an aid in  the diagnosis of influenza from Nasopharyngeal swab specimens and  should not be used as a sole basis for treatment. Nasal washings and  aspirates are unacceptable for Xpert Xpress SARS-CoV-2/FLU/RSV  testing. Fact Sheet for Patients: https://www.moore.com/ Fact Sheet for Healthcare Providers: https://www.young.biz/ This test is not yet approved or cleared by the [place] FDA and  has been authorized for detection and/or diagnosis of SARS-CoV-2 by  FDA under an Emergency Use Authorization (EUA). This EUA will remain  in effect (meaning this test can be used) for the duration of the  Covid-19 declaration under Section 564(b)(1) of the Act, 21  U.S.C. section 360bbb-3(b)(1), unless the authorization is  terminated or revoked. Performed at [facility], [address] [place], [place] [address]     Inpatient Medications:   Scheduled Meds: . divalproex  250 mg Oral Q8H  . haloperidol  10 mg Oral BID   Or  . haloperidol lactate  10 mg Intramuscular BID  . insulin aspart  0-20 Units Subcutaneous TID WC  . insulin aspart  0-5 Units Subcutaneous QHS  . insulin aspart  10 Units Intravenous Once  . insulin aspart  4 Units Subcutaneous TID WC  . insulin glargine  10 Units Subcutaneous Once  . metFORMIN  500 mg Oral Daily  . temazepam  30 mg Oral QHS  . ziprasidone       Impression/Recommendations Principal Problem:   Schizophrenia (HCC) Active Problems:   Bipolar disorder (HCC)   Diabetes mellitus type 2 in obese [facility])  If we can get her CBG down, may transfer to [facility] with Glucophage XR 500 mg. May need to add additional Lantus 10 u q hs to keep  under 400 until her Glucophage kicks in. If after 3 days, she is tolerating with minimal side effects, increase to 500 mg bid. If CBG is still > 250 after 3-4 days at this dose, would increase to 750 bid. She will need primary care follow-up for this and will need diabetic diet on return to [facility] and teaching for diet and monitoring, which can be done at [facility] or as an outpatient, once stable.   If CBG does not come back down, after next round of insulin, may need to stay and be admitted. Please call for this, if necessary.  Thank you for this consultation.  Our [facility] hospitalist team is available as needed.  Time Spent: 40 minutes   Reva Bores M.D. [facility] [phone] 04/27/2019, 5:23 PM
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
## 2019-04-27 NOTE — [facility] Group Notes (Signed)
Adult Psychoeducational Group Note  Date:  04/27/2019 Time:  12:43 PM  Group Topic/Focus:  Progressive Relaxation; Progressive muscle relaxation, How to deep breathe and Visible Imagery.  Participation Level:  Did Not Attend   Dione Housekeeper 04/27/2019, 12:43 PM

## 2019-04-27 NOTE — Progress Notes (Addendum)
Pt willingly received 20 mg of Geodon IM without incident. Pt made aware that she will be transported to the ED for further evaluation, and appears to be somewhat agreeable stating, "as long as I can wear a mask".

## 2019-04-27 NOTE — ED Notes (Signed)
Patient transported to [facility] via police escort. IVC paperwork given.

## 2019-04-27 NOTE — [facility] Group Notes (Signed)
[facility] LCSW Group Therapy Note  Date/Time:  04/27/2019  11:00AM-12:00PM  Type of Therapy and Topic:  Group Therapy:  Music and Mood  Participation Level:  Did Not Attend   Description of Group: In this process group, members listened to a variety of genres of music and identified that different types of music evoke different responses.  Patients were encouraged to identify music that was soothing for them and music that was energizing for them.  Patients discussed how this knowledge can help with wellness and recovery in various ways including managing depression and anxiety as well as encouraging healthy sleep habits.    Therapeutic Goals: Patients will explore the impact of different varieties of music on mood Patients will verbalize the thoughts they have when listening to different types of music Patients will identify music that is soothing to them as well as music that is energizing to them Patients will discuss how to use this knowledge to assist in maintaining wellness and recovery Patients will explore the use of music as a coping skill  Summary of Patient Progress:  At the beginning of group, patient was in the hall and was invited to attend.  She declined.  Therapeutic Modalities: Solution Focused Brief Therapy Activity   Ambrose Mantle, LCSW

## 2019-04-27 NOTE — Progress Notes (Signed)
Patient has been up to nursing station 3 times asking to use the phone and after being informed of reason she becomes argumentative with staff. She then sat in the hallway by the phone talking louder and louder about her mother being worried about where she is. She was informed if she continued disrupting the hallway medication would be given. She mouthed off some more and eventually returned to her room.

## 2019-04-27 NOTE — Progress Notes (Signed)
Patient up to nursing station to check and see the time currently.

## 2019-04-27 NOTE — ED Triage Notes (Signed)
She comes to [place] via EMS, being a current inpatient at the [facility] with c/o of having been found, with routine lab work to have hyperglycemia. She tells me she has never had diabetes and that she feels "fine". She is hypervigilant in her demeanor.

## 2019-04-27 NOTE — ED Notes (Signed)
This RN contacted [name] at [facility] about transporting patient back to facility since the patient's CBG is under 400. AC stated that we need to wait until at least 2200 and recheck her CBG to make sure it is staying under 400. Dr. Manus Gunning made aware.

## 2019-04-27 NOTE — Progress Notes (Signed)
Patient resting in bed with eyes closed- appears to be sleeping- respirations even and unlabored
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
## 2019-04-27 NOTE — ED Notes (Signed)
I have notified Dr. Manus Gunning that we are receiving orders from a [facility] provider; and it is necessary to receive orders from a [facility] hospitalist. He tells me he will clarify as soon as is expedient for him to do so.

## 2019-04-27 NOTE — Progress Notes (Signed)
EMS here to transport pt to [facility]

## 2019-04-27 NOTE — ED Provider Notes (Signed)
Patient has been seen by Dr. Shawnie Pons of the hospitalist service.  She has started the patient on Lantus as well as sliding scale insulin and Glucophage.  She feels her blood sugar improves she can be transferred back to behavioral health Hospital.  There is no evidence of DKA.  Patient does not want to be admitted medically.  Her tachycardia has improved to the 100s.  She denies any chest pain or shortness of breath.  Blood sugar has improved to 342.  She is not in DKA.  Patient has orders written by hospitalist for insulin as well as metformin.  She appears stable to return to behavioral health Hospital.   EKG Interpretation  Date/Time:  Sunday April 27 2019 19:22:55 EST Ventricular Rate:  112 PR Interval:    QRS Duration: 106 QT Interval:  346 QTC Calculation: 473 R Axis:   57 Text Interpretation: Sinus tachycardia ST elev, probable normal early repol pattern No significant change was found Confirmed by Glynn Octave [phone]) on 04/27/2019 7:33:02 PM          Angeline Trick, Jeannett Senior, MD 04/28/19 [phone]

## 2019-04-27 NOTE — ED Notes (Signed)
CRITICAL VALUE ALERT  Critical Value:  Blood Glucose 751  Date & Time Notied:  04/27/19 @ 1503  Provider Notified:  Dr. Juleen China @1503    Orders Received/Actions taken:

## 2019-04-27 NOTE — Progress Notes (Addendum)
Patient back up at nursing station now requesting snacks which she received. She then requested to see on paper the rules about the phone hours for use. Writer informed her that it is not on paper but is a unit rule and no one is allowed to use the phone at this hour of the morning. She stood staring at staff for a few minutes before returning to her room.

## 2019-04-27 NOTE — Progress Notes (Signed)
Non emergent EMS called for transfer to [facility]- Report given to [place], RN [facility]) Press photographer.

## 2019-04-28 LAB — GLUCOSE, CAPILLARY
Glucose-Capillary: 373 mg/dL — ABNORMAL HIGH (ref 70–99)
Glucose-Capillary: 350 mg/dL — ABNORMAL HIGH (ref 70–99)
Glucose-Capillary: 184 mg/dL — ABNORMAL HIGH (ref 70–99)
Glucose-Capillary: 242 mg/dL — ABNORMAL HIGH (ref 70–99)
Glucose-Capillary: 291 mg/dL — ABNORMAL HIGH (ref 70–99)

## 2019-04-28 MED ORDER — HALOPERIDOL LACTATE 5 MG/ML IJ SOLN
10.0000 mg | Freq: Three times a day (TID) | INTRAMUSCULAR | Status: DC
  Filled 2019-04-28 (×18): qty 2

## 2019-04-28 MED ORDER — METOPROLOL SUCCINATE ER 50 MG PO TB24
100.0000 mg | ORAL_TABLET | Freq: Every day | ORAL | Status: DC
  Administered 2019-04-28 – 2019-05-02 (×5): 100 mg via ORAL
  Filled 2019-04-28 (×3): qty 2
  Filled 2019-04-28 (×2): qty 1
  Filled 2019-04-28: qty 2
  Filled 2019-04-28: qty 1
  Filled 2019-04-28 (×2): qty 2

## 2019-04-28 MED ORDER — HALOPERIDOL 5 MG PO TABS
10.0000 mg | ORAL_TABLET | Freq: Three times a day (TID) | ORAL | Status: DC
  Administered 2019-04-28 – 2019-05-02 (×12): 10 mg via ORAL
  Filled 2019-04-28 (×18): qty 2

## 2019-04-28 MED ORDER — BENZTROPINE MESYLATE 1 MG PO TABS
1.0000 mg | ORAL_TABLET | Freq: Two times a day (BID) | ORAL | Status: DC
  Administered 2019-04-28 – 2019-05-02 (×8): 1 mg via ORAL
  Filled 2019-04-28 (×2): qty 1
  Filled 2019-04-28 (×2): qty 14
  Filled 2019-04-28 (×8): qty 1

## 2019-04-28 MED ORDER — DIVALPROEX SODIUM 125 MG PO CSDR
500.0000 mg | DELAYED_RELEASE_CAPSULE | Freq: Every day | ORAL | Status: DC
  Administered 2019-04-28 – 2019-05-01 (×4): 500 mg via ORAL
  Filled 2019-04-28 (×6): qty 4

## 2019-04-28 MED ORDER — DIVALPROEX SODIUM 125 MG PO CSDR
250.0000 mg | DELAYED_RELEASE_CAPSULE | Freq: Every day | ORAL | Status: DC
  Administered 2019-04-29 – 2019-05-02 (×4): 250 mg via ORAL
  Filled 2019-04-28 (×4): qty 2
  Filled 2019-04-28: qty 42
  Filled 2019-04-28: qty 2

## 2019-04-28 NOTE — ED Notes (Signed)
Pt transported to [facility] by [employer] d/t IVC

## 2019-04-28 NOTE — Progress Notes (Signed)
Inpatient Diabetes Program Recommendations  AACE/ADA: New Consensus Statement on Inpatient Glycemic Control (2015)  Target Ranges:  Prepandial:   less than 140 mg/dL      Peak postprandial:   less than 180 mg/dL (1-2 hours)      Critically ill patients:  140 - 180 mg/dL   Lab Results  Component Value Date   GLUCAP 350 (H) 04/28/2019   HGBA1C 14.4 (H) 04/27/2019    Review of Glycemic Control  Diabetes history: DM 2  Current orders for Inpatient glycemic control:  Metformin 500 mg Daily at supper Novolog 0-20 units tid + hs Novolog 4 units tid meal coverage  A1c 14.4%  Inpatient Diabetes Program Recommendations:    Noted A1c 14.4%. At this level would be difficult to avoid insulin as each oral DM medication will only lower her A1c level 1% each 2% if we are lucky. Per Hospitalist pt does not want to be on insulin but may do it to get her glucose levels down.  Noted no insurance, unsure of PCP. Will need close follow up.   Will need Transition of care assistance with meds and follow up. Will need glucometer at time of d/c.  Consider starting Glipizide 5 mg bid.  Will see how low her glucose trends go with the Glipizide before starting back on insulin.  Pt unavailable. Gave number to Pt RN for pt to call me back to talk about DM.   Thanks, Christena Deem RN, MSN, BC-ADM Inpatient Diabetes Coordinator Team Pager [phone] (8a-5p)

## 2019-04-28 NOTE — Progress Notes (Signed)
Report was received from the [facility] Nurse Nadine Counts stating the patients blood sugar was 293 and medically cleared to return to [facility]. The patient was transported back to [facility] by the [employer]. She was pleasant and cooperative upon arrival. She was escorted back to her room on the 500 hall. She initially agreed to take her medication and settle for sleep. When offered to the patient, she attempted to cheek the medication. When this was addressed she spit the meds out into her hand and said she wasn't going to take the medication. We at length discussed the pros and cons of taking Depakote and Restoril. She shared that she knew someone on Depakote who drooled and that this was a great concern for her. She didn't want to take medication that made her drool. This lead to the discussion of the importance of monitoring blood levels for therapeutic levels. She was assured that the dose she was prescribed was very low and that drooling was not a common side effect of Depakote. She took the medication after stating if she starts to drool she will refuse to take it again. The patient will also need a great deal of education regarding Diabetes.

## 2019-04-28 NOTE — [facility] Group Notes (Signed)
[facility] LCSW Group Therapy Note  Date/Time: 04/28/2019 @ 1:30pm  Type of Therapy and Topic:  Group Therapy:  Overcoming Obstacles  Participation Level:  [facility] PARTICIPATION LEVEL: Did Not Attend  Description of Group:    In this group patients will be encouraged to explore what they see as obstacles to their own wellness and recovery. They will be guided to discuss their thoughts, feelings, and behaviors related to these obstacles. The group will process together ways to cope with barriers, with attention given to specific choices patients can make. Each patient will be challenged to identify changes they are motivated to make in order to overcome their obstacles. This group will be process-oriented, with patients participating in exploration of their own experiences as well as giving and receiving support and challenge from other group members.  Therapeutic Goals: 1. Patient will identify personal and current obstacles as they relate to admission. 2. Patient will identify barriers that currently interfere with their wellness or overcoming obstacles.  3. Patient will identify feelings, thought process and behaviors related to these barriers. 4. Patient will identify two changes they are willing to make to overcome these obstacles:    Summary of Patient Progress  Patient did not attend group therapy, today.     Therapeutic Modalities:   Cognitive Behavioral Therapy Solution Focused Therapy Motivational Interviewing Relapse Prevention Therapy   Stephannie Peters, LCSW

## 2019-04-28 NOTE — Progress Notes (Signed)
[facility] MD Progress Note  04/28/2019 10:49 AM Kimberly Orozco  MRN:  [id number] Subjective:   Patient continues to deny illness stating she is never been diagnosed with a schizophrenic or bipolar condition, stating she will take medicine for diabetes.  She is also hypertensive and we inform her she needs medication for that  Spoke with her mother with her permission mother informs the patient had been in a group home in [place] and had been on monthly injections however she has been noncompliant here, she has been volatile, threatening all family members and continues to phone and threatened family members as well.  She is not welcome back in the home.  Mother's number is [phone]. Principal Problem: Schizophrenia (HCC) Diagnosis: Principal Problem:   Schizophrenia (HCC) Active Problems:   Bipolar disorder (HCC)   Diabetes mellitus type 2 in obese (HCC)  Total Time spent with patient: 20 minutes  Past Psychiatric History: Had been on long-acting injectable and group home status in [place] but is not welcome at her parents home  Past Medical History: History reviewed. No pertinent past medical history. History reviewed. No pertinent surgical history. Family History: History reviewed. No pertinent family history. Family Psychiatric  History: See eval Social History:  Social History   Substance and Sexual Activity  Alcohol Use None     Social History   Substance and Sexual Activity  Drug Use Not on file    Social History   Socioeconomic History  . Marital status: Single    Spouse name: Not on file  . Number of children: Not on file  . Years of education: Not on file  . Highest education level: Not on file  Occupational History  . Not on file  Tobacco Use  . Smoking status: Never Smoker  . Smokeless tobacco: Never Used  Substance and Sexual Activity  . Alcohol use: Not on file  . Drug use: Not on file  . Sexual activity: Not on file  Other Topics Concern  . Not on file   Social History Narrative  . Not on file   Social Determinants of Health   Financial Resource Strain:   . Difficulty of Paying Living Expenses: Not on file  Food Insecurity:   . Worried About Programme researcher, broadcasting/film/video in the Last Year: Not on file  . Ran Out of Food in the Last Year: Not on file  Transportation Needs:   . Lack of Transportation (Medical): Not on file  . Lack of Transportation (Non-Medical): Not on file  Physical Activity:   . Days of Exercise per Week: Not on file  . Minutes of Exercise per Session: Not on file  Stress:   . Feeling of Stress : Not on file  Social Connections:   . Frequency of Communication with Friends and Family: Not on file  . Frequency of Social Gatherings with Friends and Family: Not on file  . Attends Religious Services: Not on file  . Active Member of Clubs or Organizations: Not on file  . Attends Banker Meetings: Not on file  . Marital Status: Not on file   Additional Social History:    Pain Medications: see MAR Prescriptions: see MAR Over the Counter: see MAR                    Sleep: Fair  Appetite:  Fair  Current Medications: Current Facility-Administered Medications  Medication Dose Route Frequency Provider Last Rate Last Admin  . acetaminophen (TYLENOL) tablet  650 mg  650 mg Oral Q6H PRN Jearld Lesch, NP   650 mg at 04/26/19 0854  . alum & mag hydroxide-simeth (MAALOX/MYLANTA) 200-200-20 MG/5ML suspension 30 mL  30 mL Oral Q4H PRN Dixon, Rashaun M, NP      . benztropine (COGENTIN) tablet 1 mg  1 mg Oral BID Malvin Johns, MD      . diphenhydrAMINE (BENADRYL) capsule 50 mg  50 mg Oral Q4H PRN Antonieta Pert, MD      . Melene Muller ON 04/29/2019] divalproex (DEPAKOTE SPRINKLE) capsule 250 mg  250 mg Oral Daily Malvin Johns, MD      . divalproex (DEPAKOTE SPRINKLE) capsule 500 mg  500 mg Oral QHS Malvin Johns, MD      . haloperidol (HALDOL) tablet 10 mg  10 mg Oral TID Malvin Johns, MD       Or  . haloperidol  lactate (HALDOL) injection 10 mg  10 mg Intramuscular TID Malvin Johns, MD      . insulin aspart (novoLOG) injection 0-20 Units  0-20 Units Subcutaneous TID WC Antonieta Pert, MD   15 Units at 04/28/19 [phone]  . insulin aspart (novoLOG) injection 0-5 Units  0-5 Units Subcutaneous QHS Antonieta Pert, MD   3 Units at 04/27/19 2308  . insulin aspart (novoLOG) injection 4 Units  4 Units Subcutaneous TID WC Antonieta Pert, MD   4 Units at 04/28/19 [phone]  . LORazepam (ATIVAN) tablet 2 mg  2 mg Oral Q4H PRN Malvin Johns, MD   2 mg at 04/28/19 0853   Or  . LORazepam (ATIVAN) injection 2 mg  2 mg Intramuscular Q4H PRN Malvin Johns, MD   2 mg at 04/25/19 1311  . OLANZapine zydis (ZYPREXA) disintegrating tablet 10 mg  10 mg Oral Q8H PRN Antonieta Pert, MD       And  . LORazepam (ATIVAN) tablet 1 mg  1 mg Oral PRN Antonieta Pert, MD       And  . ziprasidone (GEODON) injection 20 mg  20 mg Intramuscular PRN Antonieta Pert, MD      . magnesium hydroxide (MILK OF MAGNESIA) suspension 30 mL  30 mL Oral Daily PRN Dixon, Rashaun M, NP      . metFORMIN (GLUCOPHAGE-XR) 24 hr tablet 500 mg  500 mg Oral Q supper Reva Bores, MD   500 mg at 04/27/19 1803  . metoprolol succinate (TOPROL-XL) 24 hr tablet 100 mg  100 mg Oral Daily Malvin Johns, MD      . temazepam (RESTORIL) capsule 30 mg  30 mg Oral QHS Malvin Johns, MD   30 mg at 04/28/19 0049  . traZODone (DESYREL) tablet 50 mg  50 mg Oral QHS PRN Jearld Lesch, NP        Lab Results:  Results for orders placed or performed during the hospital encounter of 04/24/19 (from the past 48 hour(s))  Pregnancy, urine     Status: None   Collection Time: 04/26/19  2:57 PM  Result Value Ref Range   Preg Test, Ur NEGATIVE NEGATIVE    Comment:        THE SENSITIVITY OF THIS METHODOLOGY IS >20 mIU/mL. Performed at [facility], [address], [place], [place] [address]   Urinalysis, Complete w Microscopic     Status: Abnormal    Collection Time: 04/26/19  2:57 PM  Result Value Ref Range   Color, Urine COLORLESS (A) YELLOW   APPearance CLEAR CLEAR  Specific Gravity, Urine 1.031 (H) 1.005 - 1.030   pH 5.0 5.0 - 8.0   Glucose, UA >=500 (A) NEGATIVE mg/dL   Hgb urine dipstick NEGATIVE NEGATIVE   Bilirubin Urine NEGATIVE NEGATIVE   Ketones, ur 20 (A) NEGATIVE mg/dL   Protein, ur NEGATIVE NEGATIVE mg/dL   Nitrite NEGATIVE NEGATIVE   Leukocytes,Ua NEGATIVE NEGATIVE   RBC / HPF 0-5 0 - 5 RBC/hpf   WBC, UA 11-20 0 - 5 WBC/hpf   Bacteria, UA NONE SEEN NONE SEEN   Squamous Epithelial / LPF 0-5 0 - 5    Comment: Performed at [facility], [address], [place], [place] [address]  Rapid urine drug screen (hospital performed)     Status: None   Collection Time: 04/26/19  2:57 PM  Result Value Ref Range   Opiates NONE DETECTED NONE DETECTED   Cocaine NONE DETECTED NONE DETECTED   Benzodiazepines NONE DETECTED NONE DETECTED   Amphetamines NONE DETECTED NONE DETECTED   Tetrahydrocannabinol NONE DETECTED NONE DETECTED   Barbiturates NONE DETECTED NONE DETECTED    Comment: (NOTE) DRUG SCREEN FOR MEDICAL PURPOSES ONLY.  IF CONFIRMATION IS NEEDED FOR ANY PURPOSE, NOTIFY LAB WITHIN 5 DAYS. LOWEST DETECTABLE LIMITS FOR URINE DRUG SCREEN Drug Class                     Cutoff (ng/mL) Amphetamine and metabolites    1000 Barbiturate and metabolites    200 Benzodiazepine                 200 Tricyclics and metabolites     300 Opiates and metabolites        300 Cocaine and metabolites        300 THC                            50 Performed at [facility], [address] [place], [place] [address]   Glucose, capillary     Status: Abnormal   Collection Time: 04/27/19 11:04 AM  Result Value Ref Range   Glucose-Capillary >600 (HH) 70 - 99 mg/dL  Urinalysis, Routine w reflex microscopic     Status: Abnormal   Collection Time: 04/27/19  1:42 PM  Result Value Ref Range   Color,  Urine STRAW (A) YELLOW   APPearance CLEAR CLEAR   Specific Gravity, Urine 1.030 1.005 - 1.030   pH 6.0 5.0 - 8.0   Glucose, UA >=500 (A) NEGATIVE mg/dL   Hgb urine dipstick NEGATIVE NEGATIVE   Bilirubin Urine NEGATIVE NEGATIVE   Ketones, ur 20 (A) NEGATIVE mg/dL   Protein, ur NEGATIVE NEGATIVE mg/dL   Nitrite NEGATIVE NEGATIVE   Leukocytes,Ua SMALL (A) NEGATIVE   RBC / HPF 0-5 0 - 5 RBC/hpf   WBC, UA 0-5 0 - 5 WBC/hpf   Bacteria, UA NONE SEEN NONE SEEN   Squamous Epithelial / LPF 6-10 0 - 5   Mucus PRESENT     Comment: Performed at [facility], [address], [place], [place] [address]  CBG monitoring, ED     Status: Abnormal   Collection Time: 04/27/19  2:12 PM  Result Value Ref Range   Glucose-Capillary >600 (HH) 70 - 99 mg/dL  CBC with Differential     Status: Abnormal   Collection Time: 04/27/19  2:17 PM  Result Value Ref Range   WBC 10.7 (H) 4.0 - 10.5 K/uL   RBC  4.96 3.87 - 5.11 MIL/uL   Hemoglobin 13.9 12.0 - 15.0 g/dL   HCT 14.9 70.2 - 63.7 %   MCV 88.7 80.0 - 100.0 fL   MCH 28.0 26.0 - 34.0 pg   MCHC 31.6 30.0 - 36.0 g/dL   RDW 85.8 85.0 - 27.7 %   Platelets 249 150 - 400 K/uL   nRBC 0.0 0.0 - 0.2 %   Neutrophils Relative % 78 %   Neutro Abs 8.4 (H) 1.7 - 7.7 K/uL   Lymphocytes Relative 15 %   Lymphs Abs 1.6 0.7 - 4.0 K/uL   Monocytes Relative 6 %   Monocytes Absolute 0.6 0.1 - 1.0 K/uL   Eosinophils Relative 1 %   Eosinophils Absolute 0.1 0.0 - 0.5 K/uL   Basophils Relative 0 %   Basophils Absolute 0.0 0.0 - 0.1 K/uL   Immature Granulocytes 0 %   Abs Immature Granulocytes 0.04 0.00 - 0.07 K/uL    Comment: Performed at [facility] [address], [place], [place] [address]  Basic metabolic panel     Status: Abnormal   Collection Time: 04/27/19  2:17 PM  Result Value Ref Range   Sodium 131 (L) 135 - 145 mmol/L   Potassium 4.7 3.5 - 5.1 mmol/L   Chloride 97 (L) 98 - 111 mmol/L   CO2 23 22 - 32 mmol/L   Glucose, Bld  751 (HH) 70 - 99 mg/dL    Comment: CRITICAL RESULT CALLED TO, READ BACK BY AND VERIFIED WITH: CHEEK, KIM @ 1501 04/27/2019 PERRY, J.    BUN 18 6 - 20 mg/dL   Creatinine, Ser 8.67 0.44 - 1.00 mg/dL   Calcium 8.7 (L) 8.9 - 10.3 mg/dL   GFR calc non Af Amer >60 >60 mL/min   GFR calc Af Amer >60 >60 mL/min   Anion gap 11 5 - 15    Comment: Performed at [facility], [address], [place], [place] [address]  Magnesium     Status: None   Collection Time: 04/27/19  2:17 PM  Result Value Ref Range   Magnesium 2.3 1.7 - 2.4 mg/dL    Comment: Performed at [facility], [address] [place], [place] [address]  Hemoglobin A1c     Status: Abnormal   Collection Time: 04/27/19  2:17 PM  Result Value Ref Range   Hgb A1c MFr Bld 14.4 (H) 4.8 - 5.6 %    Comment: (NOTE) Pre diabetes:          5.7%-6.4% Diabetes:              >6.4% Glycemic control for   <7.0% adults with diabetes    Mean Plasma Glucose 366.58 mg/dL    Comment: Performed at [facility], [address] [place], [place] [address]  I-Stat Beta hCG blood, ED ([facility], [facility], [facility] only)     Status: None   Collection Time: 04/27/19  2:22 PM  Result Value Ref Range   I-stat hCG, quantitative <5.0 <5 mIU/mL   Comment 3            Comment:   GEST. AGE      CONC.  (mIU/mL)   <=1 WEEK        5 - 50     2 WEEKS       50 - 500     3 WEEKS       100 - 10,000     4 WEEKS  1,000 - 30,000        FEMALE AND NON-PREGNANT FEMALE:     LESS THAN 5 mIU/mL   Respiratory Panel by RT PCR (Flu A&B, Covid) - Nasopharyngeal Swab     Status: None   Collection Time: 04/27/19  3:20 PM   Specimen: Nasopharyngeal Swab  Result Value Ref Range   SARS Coronavirus 2 by RT PCR NEGATIVE NEGATIVE    Comment: (NOTE) SARS-CoV-2 target nucleic acids are NOT DETECTED. The SARS-CoV-2 RNA is generally detectable in upper respiratoy specimens during the acute phase of infection. The lowest concentration of SARS-CoV-2 viral  copies this assay can detect is 131 copies/mL. A negative result does not preclude SARS-Cov-2 infection and should not be used as the sole basis for treatment or other patient management decisions. A negative result may occur with  improper specimen collection/handling, submission of specimen other than nasopharyngeal swab, presence of viral mutation(s) within the areas targeted by this assay, and inadequate number of viral copies (<131 copies/mL). A negative result must be combined with clinical observations, patient history, and epidemiological information. The expected result is Negative. Fact Sheet for Patients:  https://www.moore.com/https://www.fda.gov/media/142436/download Fact Sheet for Healthcare Providers:  https://www.young.biz/https://www.fda.gov/media/142435/download This test is not yet ap proved or cleared by the [place] FDA and  has been authorized for detection and/or diagnosis of SARS-CoV-2 by FDA under an Emergency Use Authorization (EUA). This EUA will remain  in effect (meaning this test can be used) for the duration of the COVID-19 declaration under Section 564(b)(1) of the Act, 21 U.S.C. section 360bbb-3(b)(1), unless the authorization is terminated or revoked sooner.    Influenza A by PCR NEGATIVE NEGATIVE   Influenza B by PCR NEGATIVE NEGATIVE    Comment: (NOTE) The Xpert Xpress SARS-CoV-2/FLU/RSV assay is intended as an aid in  the diagnosis of influenza from Nasopharyngeal swab specimens and  should not be used as a sole basis for treatment. Nasal washings and  aspirates are unacceptable for Xpert Xpress SARS-CoV-2/FLU/RSV  testing. Fact Sheet for Patients: https://www.moore.com/https://www.fda.gov/media/142436/download Fact Sheet for Healthcare Providers: https://www.young.biz/https://www.fda.gov/media/142435/download This test is not yet approved or cleared by the [place] FDA and  has been authorized for detection and/or diagnosis of SARS-CoV-2 by  FDA under an Emergency Use Authorization (EUA). This EUA will remain  in  effect (meaning this test can be used) for the duration of the  Covid-19 declaration under Section 564(b)(1) of the Act, 21  U.S.C. section 360bbb-3(b)(1), unless the authorization is  terminated or revoked. Performed at [facility], [address] [place], [place] [address]   CBG monitoring, ED     Status: Abnormal   Collection Time: 04/27/19  4:28 PM  Result Value Ref Range   Glucose-Capillary 527 (HH) 70 - 99 mg/dL  CBG monitoring, ED     Status: Abnormal   Collection Time: 04/27/19  7:03 PM  Result Value Ref Range   Glucose-Capillary 342 (H) 70 - 99 mg/dL  CBG monitoring, ED     Status: Abnormal   Collection Time: 04/27/19  9:09 PM  Result Value Ref Range   Glucose-Capillary 323 (H) 70 - 99 mg/dL   Comment 1 Notify RN   CBG monitoring, ED     Status: Abnormal   Collection Time: 04/27/19 10:01 PM  Result Value Ref Range   Glucose-Capillary 315 (H) 70 - 99 mg/dL  CBG monitoring, ED     Status: Abnormal   Collection Time: 04/27/19 11:09 PM  Result Value Ref Range   Glucose-Capillary  293 (H) 70 - 99 mg/dL  Glucose, capillary     Status: Abnormal   Collection Time: 04/28/19  6:03 AM  Result Value Ref Range   Glucose-Capillary 373 (H) 70 - 99 mg/dL   Comment 1 Notify RN   Glucose, capillary     Status: Abnormal   Collection Time: 04/28/19  8:11 AM  Result Value Ref Range   Glucose-Capillary 350 (H) 70 - 99 mg/dL    Blood Alcohol level:  No results found for: [facility]  Metabolic Disorder Labs: Lab Results  Component Value Date   HGBA1C 14.4 (H) 04/27/2019   MPG 366.58 04/27/2019   No results found for: PROLACTIN No results found for: CHOL, TRIG, HDL, CHOLHDL, VLDL, LDLCALC  Physical Findings: AIMS: Facial and Oral Movements Muscles of Facial Expression: None, normal Lips and Perioral Area: None, normal Jaw: None, normal Tongue: None, normal,Extremity Movements Upper (arms, wrists, hands, fingers): None, normal Lower (legs, knees, ankles, toes):  None, normal, Trunk Movements Neck, shoulders, hips: None, normal, Overall Severity Severity of abnormal movements (highest score from questions above): None, normal Incapacitation due to abnormal movements: None, normal Patient's awareness of abnormal movements (rate only patient's report): No Awareness, Dental Status Current problems with teeth and/or dentures?: No Does patient usually wear dentures?: No  CIWA:  CIWA-Ar Total: 4 COWS:  COWS Total Score: 4  Musculoskeletal: Strength & Muscle Tone: within normal limits Gait & Station: normal Patient leans: Right  Psychiatric Specialty Exam: Physical Exam  Review of Systems  Blood pressure (!) 171/88, pulse 99, temperature 98.2 F (36.8 C), temperature source Oral, resp. rate 18, SpO2 99 %.There is no height or weight on file to calculate BMI.  General Appearance: Casual  Eye Contact:  Fair  Speech:  Clear and Coherent  Volume:  Increased  Mood:  Irritable  Affect:  Restricted  Thought Process:  Irrelevant and Descriptions of Associations: Circumstantial  Orientation:  Other:  Not exact date otherwise oriented  Thought Content:  Denies hallucinations but at the same time denies all psychiatric pathology past or present  Suicidal Thoughts:  No  Homicidal Thoughts:  No  Memory:  Immediate;   Poor Recent;   Fair Remote;   Fair  Judgement:  Impaired  Insight:  Lacking  Psychomotor Activity:  Normal  Concentration:  Concentration: Fair and Attention Span: Fair  Recall:  [employer] of Knowledge:  Fair  Language:  Fair  Akathisia:  Negative  Handed:  Right  AIMS (if indicated):     Assets:  Resilience Social Support  ADL's:  Intact  Cognition:  WNL  Sleep:  Number of Hours: 0     Treatment Plan Summary: Daily contact with patient to assess and evaluate symptoms and progress in treatment and Medication management  Patient sleep is charted at 0 hours but of course she was in the emergency department were going to continue  temazepam again escalate Haldol to 10 mg 3 times daily in anticipation of long-acting injectable may force if patient refuses.  Johnn Hai, MD 04/28/2019, 10:49 AM

## 2019-04-28 NOTE — Progress Notes (Signed)
Inpatient Diabetes Program Recommendations  AACE/ADA: New Consensus Statement on Inpatient Glycemic Control (2015)  Target Ranges:  Prepandial:   less than 140 mg/dL      Peak postprandial:   less than 180 mg/dL (1-2 hours)      Critically ill patients:  140 - 180 mg/dL   Lab Results  Component Value Date   GLUCAP 184 (H) 04/28/2019   HGBA1C 14.4 (H) 04/27/2019    Spoke with pt over the phone regarding new diabetes diagnosis. Pt was very short with her answers. She was only interested in going home. Wanted only basic information. She said she has heard information like this in the past with someone else. She also said a lot of [place] people have diabetes. I assured her that a larger portion of the population has Diabetes and we anted to make sure she had everything she needed when she got home.  All topics were briefly discussed A1c level Checking CBGS twice a day Hypoglycemia s/s and treatment Long acting insulin when and how to take Plate method Modified beverage options  Have attached educational materials to paperwork. I have links to insulin pen video for pt to view at some point before she goes home.  Link for insulin pen administration instruction [email or web address].[email or web address]  Link for using a blood glucose meter to check your glucose [email or web address].se  Link for Hypoglycemia (low blood sugar) [email or web address]  Pt with no insurance will need [facility] follow up appt made so she can access [facility] for her insulins ($4-10 medications a piece). They also have a [facility] that can give her Basaglar insulin for free if she is below the poverty line.  Thanks,  Christena Deem RN, MSN, BC-ADM Inpatient Diabetes Coordinator Team Pager [phone] (8a-5p)

## 2019-04-28 NOTE — Progress Notes (Signed)
Recreation Therapy Notes  Date: 2.15.21 Time: 1000 Location: 500 Hall Dayroom  Group Topic: Coping Skills  Goal Area(s) Addresses:  Patient will identify positive coping skills. Patient will identify benefit of using positive coping skills.  Intervention: Magazines, construction paper, scissors, glue sticks  Activity: Coping Collage.  Patients were to use magazine to identify pictures of coping skills that could be used for diversions, social, cognitive, tension releasers and physical.  Patients were to find at least two positive coping skills for each.   Education: Coping Skills, Discharge Planning.   Education Outcome: Acknowledges understanding/In group clarification offered/Needs additional education.   Clinical Observations/Feedback: Pt did not attend group session.    Evrett Hakim, LRT/CTRS         Alizay Bronkema A 04/28/2019 12:03 PM 
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
## 2019-04-28 NOTE — Progress Notes (Signed)
Recreation Therapy Notes  INPATIENT RECREATION THERAPY ASSESSMENT  Patient Details Name: Defne Gerling MRN: [id number] DOB: Dec 28, 1975 Today's Date: 04/28/2019       Information Obtained From: Chart Review  Reason for Admission (Per Patient): Med Non-Compliance, Aggressive/Threatening, Other (Comments)(Hallucinations)  Patient Stressors: (None identified)  Coping Skills:      Leisure Interests (2+):  Individual - Other (Comment), Music - Listen, Sports - Other (Comment)(Bowling/Movies/Pool)  Frequency of Recreation/Participation: Other (Comment)(Listen to music- Occasionally; Bowling/Pool/Movies- Past)  Awareness of Community Resources:  (Not identified)  [place] of Residence:  [place]  Patient Strengths:  Communication/Responsible  Patient Identified Areas of Improvement:  None identified  Patient Goal for Hospitalization:  Go home  Staff Intervention Plan: Group Attendance, Collaborate with Interdisciplinary Treatment Team  Consent to Intern Participation: N/A    Caroll Rancher, LRT/CTRS  Caroll Rancher A 04/28/2019, 12:20 PM

## 2019-04-28 NOTE — Progress Notes (Signed)
   04/28/19 2216  Psych Admission Type (Psych Patients Only)  Admission Status Involuntary  Psychosocial Assessment  Patient Complaints Anxiety  Eye Contact Brief  Facial Expression Flat  Affect Irritable  Speech Logical/coherent  Interaction Cautious  Motor Activity Other (Comment) (WNL)  Appearance/Hygiene Unremarkable  Behavior Characteristics Cooperative;Irritable  Mood Irritable  Aggressive Behavior  Effect No apparent injury  Thought Process  Coherency WDL  Content WDL  Delusions None reported or observed  Perception WDL  Hallucination None reported or observed  Judgment Poor  Confusion None  Danger to Self  Current suicidal ideation? Denies  Danger to Others  Danger to Others None reported or observed   Pt seen at med window. Slightly irritable. Took meds without incident. Checked mouth and no evidence of cheeking.Pt cooperative.

## 2019-04-29 LAB — GLUCOSE, CAPILLARY
Glucose-Capillary: 263 mg/dL — ABNORMAL HIGH (ref 70–99)
Glucose-Capillary: 451 mg/dL — ABNORMAL HIGH (ref 70–99)
Glucose-Capillary: 358 mg/dL — ABNORMAL HIGH (ref 70–99)

## 2019-04-29 MED ORDER — CLONIDINE HCL 0.2 MG PO TABS
0.2000 mg | ORAL_TABLET | Freq: Two times a day (BID) | ORAL | Status: DC
  Administered 2019-04-29 – 2019-05-02 (×7): 0.2 mg via ORAL
  Filled 2019-04-29: qty 14
  Filled 2019-04-29: qty 2
  Filled 2019-04-29 (×5): qty 1
  Filled 2019-04-29: qty 14
  Filled 2019-04-29 (×4): qty 1

## 2019-04-29 MED ORDER — HALOPERIDOL DECANOATE 100 MG/ML IM SOLN
100.0000 mg | INTRAMUSCULAR | Status: DC
  Administered 2019-04-29: 100 mg via INTRAMUSCULAR
  Filled 2019-04-29: qty 1

## 2019-04-29 NOTE — Progress Notes (Signed)
   04/29/19 2000  Psych Admission Type (Psych Patients Only)  Admission Status Involuntary  Psychosocial Assessment  Patient Complaints Anxiety  Eye Contact Brief  Facial Expression Anxious  Affect Irritable  Speech Logical/coherent  Interaction Assertive;Cautious  Motor Activity Other (Comment) (WNL)  Appearance/Hygiene Body odor  Behavior Characteristics Cooperative;Irritable  Mood Irritable  Aggressive Behavior  Effect No apparent injury  Thought Process  Coherency WDL  Content WDL  Delusions None reported or observed  Perception WDL  Hallucination None reported or observed  Judgment Poor  Confusion None  Danger to Self  Current suicidal ideation? Denies  Danger to Others  Danger to Others None reported or observed   Pt irritable but cooperative. CBG continues to be elevated (358). Pt irritable with the finger sticks. Finally agreed to this last stick of the day after she was told that it was ordered to be checked.
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
## 2019-04-30 LAB — GLUCOSE, CAPILLARY
Glucose-Capillary: 288 mg/dL — ABNORMAL HIGH (ref 70–99)
Glucose-Capillary: 305 mg/dL — ABNORMAL HIGH (ref 70–99)
Glucose-Capillary: 319 mg/dL — ABNORMAL HIGH (ref 70–99)
Glucose-Capillary: 221 mg/dL — ABNORMAL HIGH (ref 70–99)

## 2019-04-30 MED ORDER — HALOPERIDOL DECANOATE 100 MG/ML IM SOLN
50.0000 mg | Freq: Once | INTRAMUSCULAR | Status: AC
  Administered 2019-04-30: 50 mg via INTRAMUSCULAR
  Filled 2019-04-30: qty 0.5
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
## 2019-04-30 NOTE — Progress Notes (Signed)
Inpatient Diabetes Program Recommendations  AACE/ADA: New Consensus Statement on Inpatient Glycemic Control (2015)  Target Ranges:  Prepandial:   less than 140 mg/dL      Peak postprandial:   less than 180 mg/dL (1-2 hours)      Critically ill patients:  140 - 180 mg/dL   Lab Results  Component Value Date   GLUCAP 288 (H) 04/30/2019   HGBA1C 14.4 (H) 04/27/2019    Review of Glycemic Control  Diabetes history: DM 2  Current orders for Inpatient glycemic control:  Metformin 500 mg Daily at supper Novolog 0-20 units tid + hs Novolog 4 units tid meal coverage  A1c 14.4%  Inpatient Diabetes Program Recommendations:    Noted A1c 14.4%. At this level would be difficult to avoid insulin as each oral DM medication will only lower her A1c level 1% each 2% if we are lucky. Per Hospitalist pt does not want to be on insulin but may do it to get her glucose levels down.  Will need close follow up.   Will need Transition of care assistance with meds and follow up. Will need glucometer at time of d/c.  Consider starting Glipizide 5 mg bid.  Will see how low her glucose trends go with the Glipizide before starting back on insulin.  Spoke with pt on 2/15.   Thanks, Christena Deem RN, MSN, BC-ADM Inpatient Diabetes Coordinator Team Pager [phone] (8a-5p)

## 2019-04-30 NOTE — Progress Notes (Signed)
Recreation Therapy Notes  Date:  2.17.21 Time: 1000 Location: 500 Hall Dayroom  Group Topic: Stress Management  Goal Area(s) Addresses:  Patient will identify positive stress management techniques. Patient will identify benefits of using stress management post d/c.  Behavioral Response:  Engaged  Intervention: Stress Management  Activity : Meditation.  LRT a meditation that focused on clearing your chakras and doing positive self talk.  Patients were to listen and mentally focus on saying positive chants to self.  Education:  Stress Management, Discharge Planning.   Education Outcome: Acknowledges Education  Clinical Observations/Feedback: Pt came in late to group but joined in with the meditation.  Pt active and appropriate during group session.    Caroll Rancher, LRT/CTRS   Caroll Rancher A 04/30/2019 11:45 AM

## 2019-04-30 NOTE — Progress Notes (Signed)
Patient did not attend wrap up group. 
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
## 2019-04-30 NOTE — Progress Notes (Signed)
[facility] LCSW Group Therapy Note  Date/Time: 2/17 @ 1:30pm   Type of Therapy/Topic:  Group Therapy:  Feelings about Diagnosis  Participation Level:  Active   Mood: Pleasant    Description of Group:    This group will allow patients to explore their thoughts and feelings about diagnoses they have received. Patients will be guided to explore their level of understanding and acceptance of these diagnoses. Facilitator will encourage patients to process their thoughts and feelings about the reactions of others to their diagnosis, and will guide patients in identifying ways to discuss their diagnosis with significant others in their lives. This group will be process-oriented, with patients participating in exploration of their own experiences as well as giving and receiving support and challenge from other group members.   Therapeutic Goals: 1. Patient will demonstrate understanding of diagnosis as evidence by identifying two or more symptoms of the disorder:  2. Patient will be able to express two feelings regarding the diagnosis 3. Patient will demonstrate ability to communicate their needs through discussion and/or role plays  Summary of Patient Progress:  Pt mentioned that she felt sick about her diagnosis and she believes it is wrong. Pt stated she has PTSD but not schizophrenia.   Therapeutic Modalities:   Cognitive Behavioral Therapy Brief Therapy Feelings Identification   Earlyne Iba, MSW intern

## 2019-05-01 LAB — GLUCOSE, CAPILLARY
Glucose-Capillary: 250 mg/dL — ABNORMAL HIGH (ref 70–99)
Glucose-Capillary: 437 mg/dL — ABNORMAL HIGH (ref 70–99)
Glucose-Capillary: 397 mg/dL — ABNORMAL HIGH (ref 70–99)
Glucose-Capillary: 327 mg/dL — ABNORMAL HIGH (ref 70–99)

## 2019-05-01 NOTE — Progress Notes (Signed)
Recreation Therapy Notes  Date: 2.18.21 Time: 1000 Location: 500 Hall Dayroom  Group Topic: Communication  Goal Area(s) Addresses:  Patient will effectively communicate with peers in group.  Patient will verbalize benefit of healthy communication. Patient will verbalize positive effect of healthy communication on post d/c goals.  Patient will identify communication techniques that made activity effective for group.   Intervention: Pencils, paper  Activity: Geometrical Drawings.  Patients took turns describing pictures to the group.  The person describing the picture could use as much detail as necessary to describe the picture.  The remaining group members had to draw the as it was described to them.  The group could not ask any clarifying questions.  The group could only ask the presenter to repeat themselves.  Education: Communication, Discharge Planning  Education Outcome: Acknowledges understanding/In group clarification offered/Needs additional education.   Clinical Observations/Feedback: Pt did not attend group session.     Caroll Rancher, LRT/CTRS         Lillia Abed, Malashia Kamaka A 05/01/2019 11:20 AM

## 2019-05-01 NOTE — Progress Notes (Signed)
   05/01/19 2030  Psych Admission Type (Psych Patients Only)  Admission Status Involuntary  Psychosocial Assessment  Patient Complaints Irritability  Eye Contact Brief  Facial Expression Anxious  Affect Irritable  Speech Logical/coherent  Interaction Assertive;Cautious  Motor Activity Other (Comment) (WNL)  Appearance/Hygiene Body odor  Behavior Characteristics Cooperative;Irritable  Mood Irritable  Aggressive Behavior  Effect No apparent injury  Thought Process  Coherency WDL  Content WDL  Delusions None reported or observed  Perception WDL  Hallucination None reported or observed  Judgment Poor  Confusion None  Danger to Self  Current suicidal ideation? Denies  Danger to Others  Danger to Others None reported or observed   Pt takes meds without incident. Irritable because she wants to be discharged. Pt still has uncontrolled blood sugars. Tonight, CBG=327.

## 2019-05-01 NOTE — Progress Notes (Signed)
[facility] MD Progress Note  05/01/2019 10:05 AM Kimberly Orozco  MRN:  [id number] Subjective:   Behaviorally contained has received Haldol long-acting injectable in addition to oral overlap she continues to insist she can stay with her mother although has been told this is not an option now states she will stay with an uncle but cannot give me the address we will work with social work probable discharge tomorrow Principal Problem: Schizophrenia (HCC) Diagnosis: Principal Problem:   Schizophrenia (HCC) Active Problems:   Bipolar disorder (HCC)   Diabetes mellitus type 2 in obese (HCC)  Total Time spent with patient: 15 minutes  Past Psychiatric History: Extensive  Past Medical History: History reviewed. No pertinent past medical history. History reviewed. No pertinent surgical history. Family History: History reviewed. No pertinent family history. Family Psychiatric  History: No new data Social History:  Social History   Substance and Sexual Activity  Alcohol Use None     Social History   Substance and Sexual Activity  Drug Use Not on file    Social History   Socioeconomic History  . Marital status: Single    Spouse name: Not on file  . Number of children: Not on file  . Years of education: Not on file  . Highest education level: Not on file  Occupational History  . Not on file  Tobacco Use  . Smoking status: Never Smoker  . Smokeless tobacco: Never Used  Substance and Sexual Activity  . Alcohol use: Not on file  . Drug use: Not on file  . Sexual activity: Not on file  Other Topics Concern  . Not on file  Social History Narrative  . Not on file   Social Determinants of Health   Financial Resource Strain:   . Difficulty of Paying Living Expenses: Not on file  Food Insecurity:   . Worried About Programme researcher, broadcasting/film/video in the Last Year: Not on file  . Ran Out of Food in the Last Year: Not on file  Transportation Needs:   . Lack of Transportation (Medical): Not on file   . Lack of Transportation (Non-Medical): Not on file  Physical Activity:   . Days of Exercise per Week: Not on file  . Minutes of Exercise per Session: Not on file  Stress:   . Feeling of Stress : Not on file  Social Connections:   . Frequency of Communication with Friends and Family: Not on file  . Frequency of Social Gatherings with Friends and Family: Not on file  . Attends Religious Services: Not on file  . Active Member of Clubs or Organizations: Not on file  . Attends Banker Meetings: Not on file  . Marital Status: Not on file   Additional Social History:    Pain Medications: see MAR Prescriptions: see MAR Over the Counter: see MAR                    Sleep: Good  Appetite:  Good  Current Medications: Current Facility-Administered Medications  Medication Dose Route Frequency Provider Last Rate Last Admin  . acetaminophen (TYLENOL) tablet 650 mg  650 mg Oral Q6H PRN Jearld Lesch, NP   650 mg at 04/26/19 0854  . alum & mag hydroxide-simeth (MAALOX/MYLANTA) 200-200-20 MG/5ML suspension 30 mL  30 mL Oral Q4H PRN Dixon, Rashaun M, NP      . benztropine (COGENTIN) tablet 1 mg  1 mg Oral BID Malvin Johns, MD   1 mg at 05/01/19 8295  .  cloNIDine (CATAPRES) tablet 0.2 mg  0.2 mg Oral BID Malvin Johns, MD   0.2 mg at 05/01/19 4098  . diphenhydrAMINE (BENADRYL) capsule 50 mg  50 mg Oral Q4H PRN Antonieta Pert, MD   50 mg at 05/01/19 0027  . divalproex (DEPAKOTE SPRINKLE) capsule 250 mg  250 mg Oral Daily Malvin Johns, MD   250 mg at 05/01/19 1191  . divalproex (DEPAKOTE SPRINKLE) capsule 500 mg  500 mg Oral QHS Malvin Johns, MD   500 mg at 04/30/19 2129  . haloperidol (HALDOL) tablet 10 mg  10 mg Oral TID Malvin Johns, MD   10 mg at 05/01/19 4782   Or  . haloperidol lactate (HALDOL) injection 10 mg  10 mg Intramuscular TID Malvin Johns, MD      . haloperidol decanoate (HALDOL DECANOATE) 100 MG/ML injection 100 mg  100 mg Intramuscular Q30 days Malvin Johns, MD   100 mg at 04/29/19 0936  . insulin aspart (novoLOG) injection 0-20 Units  0-20 Units Subcutaneous TID WC Antonieta Pert, MD   15 Units at 04/30/19 1704  . insulin aspart (novoLOG) injection 0-5 Units  0-5 Units Subcutaneous QHS Antonieta Pert, MD   2 Units at 04/30/19 2132  . insulin aspart (novoLOG) injection 4 Units  4 Units Subcutaneous TID WC Antonieta Pert, MD   4 Units at 04/30/19 1704  . LORazepam (ATIVAN) tablet 2 mg  2 mg Oral Q4H PRN Malvin Johns, MD   2 mg at 04/28/19 0853   Or  . LORazepam (ATIVAN) injection 2 mg  2 mg Intramuscular Q4H PRN Malvin Johns, MD   2 mg at 04/25/19 1311  . OLANZapine zydis (ZYPREXA) disintegrating tablet 10 mg  10 mg Oral Q8H PRN Antonieta Pert, MD       And  . LORazepam (ATIVAN) tablet 1 mg  1 mg Oral PRN Antonieta Pert, MD       And  . ziprasidone (GEODON) injection 20 mg  20 mg Intramuscular PRN Antonieta Pert, MD      . magnesium hydroxide (MILK OF MAGNESIA) suspension 30 mL  30 mL Oral Daily PRN Dixon, Rashaun M, NP      . metFORMIN (GLUCOPHAGE-XR) 24 hr tablet 500 mg  500 mg Oral Q supper Reva Bores, MD   500 mg at 04/30/19 1706  . metoprolol succinate (TOPROL-XL) 24 hr tablet 100 mg  100 mg Oral Daily Malvin Johns, MD   100 mg at 05/01/19 9562  . temazepam (RESTORIL) capsule 30 mg  30 mg Oral QHS Malvin Johns, MD   30 mg at 04/30/19 2129  . traZODone (DESYREL) tablet 50 mg  50 mg Oral QHS PRN Jearld Lesch, NP        Lab Results:  Results for orders placed or performed during the hospital encounter of 04/24/19 (from the past 48 hour(s))  Glucose, capillary     Status: Abnormal   Collection Time: 04/29/19  5:13 PM  Result Value Ref Range   Glucose-Capillary 451 (H) 70 - 99 mg/dL  Glucose, capillary     Status: Abnormal   Collection Time: 04/29/19  7:30 PM  Result Value Ref Range   Glucose-Capillary 358 (H) 70 - 99 mg/dL  Glucose, capillary     Status: Abnormal   Collection Time: 04/30/19  6:05 AM   Result Value Ref Range   Glucose-Capillary 288 (H) 70 - 99 mg/dL  Glucose, capillary     Status: Abnormal  Collection Time: 04/30/19 11:51 AM  Result Value Ref Range   Glucose-Capillary 305 (H) 70 - 99 mg/dL  Glucose, capillary     Status: Abnormal   Collection Time: 04/30/19  4:46 PM  Result Value Ref Range   Glucose-Capillary 319 (H) 70 - 99 mg/dL  Glucose, capillary     Status: Abnormal   Collection Time: 04/30/19  9:27 PM  Result Value Ref Range   Glucose-Capillary 221 (H) 70 - 99 mg/dL   Comment 1 Notify RN   Glucose, capillary     Status: Abnormal   Collection Time: 05/01/19  6:34 AM  Result Value Ref Range   Glucose-Capillary 250 (H) 70 - 99 mg/dL    Blood Alcohol level:  No results found for: [facility]  Metabolic Disorder Labs: Lab Results  Component Value Date   HGBA1C 14.4 (H) 04/27/2019   MPG 366.58 04/27/2019   No results found for: PROLACTIN No results found for: CHOL, TRIG, HDL, CHOLHDL, VLDL, LDLCALC  Physical Findings: AIMS: Facial and Oral Movements Muscles of Facial Expression: None, normal Lips and Perioral Area: None, normal Jaw: None, normal Tongue: None, normal,Extremity Movements Upper (arms, wrists, hands, fingers): None, normal Lower (legs, knees, ankles, toes): None, normal, Trunk Movements Neck, shoulders, hips: None, normal, Overall Severity Severity of abnormal movements (highest score from questions above): None, normal Incapacitation due to abnormal movements: None, normal Patient's awareness of abnormal movements (rate only patient's report): No Awareness, Dental Status Current problems with teeth and/or dentures?: No Does patient usually wear dentures?: No  CIWA:  CIWA-Ar Total: 4 COWS:  COWS Total Score: 4  Musculoskeletal: Strength & Muscle Tone: within normal limits Gait & Station: normal Patient leans: N/A  Psychiatric Specialty Exam: Physical Exam  Review of Systems  Blood pressure (!) 126/91, pulse 75, temperature 99.1 F  (37.3 C), resp. rate 20, SpO2 99 %.There is no height or weight on file to calculate BMI.  General Appearance: Casual  Eye Contact:  Good  Speech:  Clear and Coherent  Volume:  Normal  Mood:  Irritable  Affect:  Constricted  Thought Process:  Irrelevant and Descriptions of Associations: Circumstantial  Orientation:  Full (Time, Place, and Person)  Thought Content:  Denies hallucinations  Suicidal Thoughts:  No  Homicidal Thoughts:  No  Memory:  Immediate;   Poor Recent;   Poor Remote;   Fair  Judgement: No disruptive behaviors  Insight:  Fair and Shallow  Psychomotor Activity:  Normal  Concentration:  Concentration: Fair and Attention Span: Fair  Recall:  [employer] of Knowledge:  Fair  Language:  Fair  Akathisia:  Negative  Handed:  Right  AIMS (if indicated):     Assets:  Communication Skills Desire for Improvement  ADL's:  Intact  Cognition:  WNL  Sleep:  Number of Hours: 6.25     Treatment Plan Summary: Daily contact with patient to assess and evaluate symptoms and progress in treatment and Medication management  Continue current haloperidol long-acting injectable plus oral overlap no change in precautions probable discharge tomorrow  Johnn Hai, MD 05/01/2019, 10:05 AM

## 2019-05-01 NOTE — Progress Notes (Signed)
Patient ID: Kimberly Orozco, female   DOB: [birth year], 44 y.o.   MRN: [id number]   CSW contacted [facility] to see if they have a bed opening for the patient on 2/19. CSW left a voicemail and explained that the patient needs a place to stay. CSW informed other CSWs and discussed the patient possibly needing a PCP doctor due to her new onset of diabetes. During group on 2/17, patient discussed being okay with getting a doctor for her diabetes.

## 2019-05-01 NOTE — Progress Notes (Signed)
   04/29/19 2000  Psych Admission Type (Psych Patients Only)  Admission Status Involuntary  Psychosocial Assessment  Patient Complaints Anxiety  Eye Contact Brief  Facial Expression Anxious  Affect Irritable  Speech Logical/coherent  Interaction Assertive;Cautious  Motor Activity Other (Comment) (WNL)  Appearance/Hygiene Body odor  Behavior Characteristics Cooperative;Irritable  Mood Irritable  Aggressive Behavior  Effect No apparent injury  Thought Process  Coherency WDL  Content WDL  Delusions None reported or observed  Perception WDL  Hallucination None reported or observed  Judgment Poor  Confusion None  Danger to Self  Current suicidal ideation? Denies  Danger to Others  Danger to Others None reported or observed

## 2019-05-01 NOTE — Progress Notes (Addendum)
Inpatient Diabetes Program Recommendations  AACE/ADA: New Consensus Statement on Inpatient Glycemic Control (2015)  Target Ranges:  Prepandial:   less than 140 mg/dL      Peak postprandial:   less than 180 mg/dL (1-2 hours)      Critically ill patients:  140 - 180 mg/dL   Results for SYLIVA, MEE (MRN [id number]) as of 05/01/2019 09:49  Ref. Range 04/30/2019 06:05 04/30/2019 11:51 04/30/2019 16:46 04/30/2019 21:27  Glucose-Capillary Latest Ref Range: 70 - 99 mg/dL 295 (H)  15 units NOVOLOG  305 (H)  19 units NOVOLOG  319 (H)  19 units NOVOLOG  221 (H)  2 units NOVOLOG    Results for MARLIYAH, REID (MRN [id number]) as of 05/01/2019 09:49  Ref. Range 05/01/2019 06:34  Glucose-Capillary Latest Ref Range: 70 - 99 mg/dL 102 (H)   Results for JANELLI, WELLING (MRN [id number]) as of 05/01/2019 09:49  Ref. Range 04/27/2019 14:17  Hemoglobin A1C Latest Ref Range: 4.8 - 5.6 % 14.4 (H)  (366 mg/dl)    Admit with: Schizophrenia/ Involuntary Admission  New Diagnosis of Diabetes this Admission as well    Current Orders: Novolog Resistant Correction Scale/ SSI (0-20 units) TID AC + HS      Novolog 4 units TID with meals      Metformin 500 mg QPM    Patient was counseled by the Diabetes RN, however, pt not really in an ideal position to learn about her diabetes care at this point given her acute Psychiatric illness.  Patient does NOT wish to d/c home on Insulin.  Given her Psychiatric history, not sure insulin would be the best choice--May be more compliant with oral meds.    MD- While patient in the in-patient settings, please consider the following to help control CBGs:  1. Start Lantus 10 units QHS  2. Increase Metformin to 500 mg BID  3. Start Glipizide 5 mg BID  4. May be best to try to d/c home on oral meds given Psychiatric illness    --Will follow patient during hospitalization--  Ambrose Finland RN, MSN, CDE Diabetes Coordinator Inpatient Glycemic  Control Team Team Pager: [phone] (8a-5p)
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
## 2019-05-01 NOTE — Progress Notes (Signed)
Psychoeducational Group Note  Date:  05/01/2019 Time:  2029  Group Topic/Focus:  Wrap-Up Group:   The focus of this group is to help patients review their daily goal of treatment and discuss progress on daily workbooks.  Participation Level: Did Not Attend  Participation Quality:  Not Applicable  Affect:  Not Applicable  Cognitive:  Not Applicable  Insight:  Not Applicable  Engagement in Group: Not Applicable  Additional Comments:  The patient was unable to attend group due to being asleep.   Hazle Coca S 05/01/2019, 8:29 PM

## 2019-05-02 LAB — GLUCOSE, CAPILLARY
Glucose-Capillary: 318 mg/dL — ABNORMAL HIGH (ref 70–99)
Glucose-Capillary: 341 mg/dL — ABNORMAL HIGH (ref 70–99)

## 2019-05-02 MED ORDER — PROPRANOLOL HCL ER 80 MG PO CP24: 80.0000 mg | ORAL_CAPSULE | Freq: Every day | ORAL | 11 refills | Status: DC

## 2019-05-02 MED ORDER — DIVALPROEX SODIUM 125 MG PO CSDR
250.0000 mg | DELAYED_RELEASE_CAPSULE | Freq: Two times a day (BID) | ORAL | Status: DC
  Filled 2019-05-02 (×3): qty 28

## 2019-05-02 MED ORDER — BENZTROPINE MESYLATE 1 MG PO TABS: 1.0000 mg | ORAL_TABLET | Freq: Two times a day (BID) | ORAL | 2 refills | Status: DC

## 2019-05-02 MED ORDER — PROPRANOLOL HCL ER 80 MG PO CP24
80.0000 mg | ORAL_CAPSULE | Freq: Every day | ORAL | Status: DC
  Filled 2019-05-02 (×2): qty 7

## 2019-05-02 MED ORDER — METFORMIN HCL ER 500 MG PO TB24: 500.0000 mg | ORAL_TABLET | Freq: Every day | ORAL | 2 refills | Status: DC

## 2019-05-02 MED ORDER — DIVALPROEX SODIUM 125 MG PO CSDR: 250.0000 mg | DELAYED_RELEASE_CAPSULE | Freq: Two times a day (BID) | ORAL | 2 refills | Status: DC

## 2019-05-02 MED ORDER — TEMAZEPAM 30 MG PO CAPS: 30.0000 mg | ORAL_CAPSULE | Freq: Every day | ORAL | 0 refills | Status: DC

## 2019-05-02 MED ORDER — HALOPERIDOL 10 MG PO TABS: ORAL_TABLET | ORAL | 1 refills | Status: DC

## 2019-05-02 MED ORDER — HALOPERIDOL 5 MG PO TABS
10.0000 mg | ORAL_TABLET | Freq: Every day | ORAL | Status: DC
  Filled 2019-05-02 (×2): qty 42

## 2019-05-02 MED ORDER — INSULIN ASPART 100 UNIT/ML ~~LOC~~ SOLN: 4.0000 [IU] | Freq: Three times a day (TID) | SUBCUTANEOUS | 11 refills | Status: DC

## 2019-05-02 MED ORDER — CLONIDINE HCL 0.2 MG PO TABS: 0.2000 mg | ORAL_TABLET | Freq: Two times a day (BID) | ORAL | 2 refills | Status: DC

## 2019-05-02 MED ORDER — HALOPERIDOL DECANOATE 100 MG/ML IM SOLN: 100.0000 mg | INTRAMUSCULAR | 11 refills | Status: DC

## 2019-05-02 NOTE — Progress Notes (Addendum)
Inpatient Diabetes Program Recommendations  AACE/ADA: New Consensus Statement on Inpatient Glycemic Control (2015)  Target Ranges:  Prepandial:   less than 140 mg/dL      Peak postprandial:   less than 180 mg/dL (1-2 hours)      Critically ill patients:  140 - 180 mg/dL    Results for Kimberly Orozco, Kimberly Orozco (MRN [id number]) as of 05/01/2019 09:49  Ref. Range 04/27/2019 14:17  Hemoglobin A1C Latest Ref Range: 4.8 - 5.6 % 14.4 (H)  (366 mg/dl)    Admit with: Schizophrenia/ Involuntary Admission  New Diagnosis of Diabetes this Admission as well    Current Orders: Novolog Resistant Correction Scale/ SSI (0-20 units) TID AC + HS      Novolog 4 units TID with meals      Metformin 500 mg QPM    Patient was counseled by the Diabetes RN, however, pt not really in an ideal position to learn about her diabetes care at this point given her acute Psychiatric illness.  Patient does NOT wish to d/c home on Insulin.  Given her Psychiatric history, not sure insulin would be the best choice--May be more compliant with oral meds.    MD- please consider the following to help control CBGs:    - Increase Metformin to 500 mg BID  - Start Glipizide 5 mg BID   May be best to try to d/c home on oral meds given Psychiatric illness    --Will follow patient during hospitalization--  Christena Deem RN, MSN, BC-ADM Inpatient Diabetes Coordinator Team Pager [phone] (8a-5p)

## 2019-05-02 NOTE — Progress Notes (Signed)
Supervisor coordinated with Spero Curb to get pt to safe transport ride and then called [employer], who agreed to dispatch officer to the address. Garner Nash, MSW, LCSW Advanced Care Supervisor 05/02/2019 1:49 PM

## 2019-05-02 NOTE — [facility] Group Notes (Addendum)
[facility] LCSW Group Therapy Note  Date/Time:  05/02/2019 9:00-10:00 or 10:00-11:00AM  Type of Therapy and Topic:  Group Therapy:  Healthy and Unhealthy Supports  Participation Level:  Active   Description of Group:  Patients in this group were introduced to the idea of adding a variety of healthy supports to address the various needs in their lives.Patients discussed what additional healthy supports could be helpful in their recovery and wellness after discharge in order to prevent future hospitalizations.   An emphasis was placed on using counselor, doctor, therapy groups, 12-step groups, and problem-specific support groups to expand supports.  They also worked as a group on developing a specific plan for several patients to deal with unhealthy supports through boundary-setting, psychoeducation with loved ones, and even termination of relationships.   Therapeutic Goals:   1)  discuss importance of adding supports to stay well once out of the hospital  2)  compare healthy versus unhealthy supports and identify some examples of each  3)  generate ideas and descriptions of healthy supports that can be added  4)  offer mutual support about how to address unhealthy supports  5)  encourage active participation in and adherence to discharge plan    Summary of Patient Progress:  The patient did not identify current healthy supports in her life  while current unhealthy supports include her parents who she says are alcoholics who need to IVC's.  The patient expressed restlessness and is frustrated with being in the hospital. She went of topic and made mention of [place] and losing her rights.  Therapeutic Modalities:   Motivational Interviewing Brief Solution-Focused Therapy  Evorn Gong

## 2019-05-02 NOTE — Progress Notes (Signed)
  [facility] Adult Case Management Discharge Plan :  Will you be returning to the same living situation after discharge:  No. Pt has declined referral to [facility] and reports she will find her own accomodations for the night.   At discharge, do you have transportation home?: No. Denyse Amass provided.   Do you have the ability to pay for your medications: No. Will work with [employer].    Release of information consent forms completed and in the chart;  Patient's signature needed at discharge.  Patient to Follow up at: Follow-up Information    [facility] Follow up on 05/06/2019.   Why: You are scheduled for an appointment on 05/06/19 at 11:00 am.  This will be a virtual tele-health appointment.  Contact information: [address] [place] [address] [phone]           Next level of care provider has access to [facility] Link:no  Safety Planning and Suicide Prevention discussed: Yes,  with mother     Has patient been referred to the Quitline?: Patient refused referral  Patient has been referred for addiction treatment: Yes, [facility].   Lorri Frederick, LCSW 05/02/2019, 12:58 PM
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
## 2019-05-02 NOTE — Progress Notes (Signed)
Discharge Note:  Patient discharged with driver.  Patient denied SI and HI.  Denied A/V hallucinations.  Denied pain.  Suicide prevention information given and discussed with patient who stated she understood and had no questions.  Patient stated she received her belongings, etc.  Patient stated she appreciated all assistance received from [facility] staff.  All required discharge information given to patient at discharge.

## 2019-05-02 NOTE — Progress Notes (Addendum)
Supervisor called [facility], who does have an available bed.  They asked to speak directly with pt.  Supervisor spoke with pt regarding discharge. Pt immediately stated she is planning to go stay with her uncle.  Supervisor had conversation with pt that her mother has said she cannot stay with the mother or the uncle, suggested [employer].  Pt said she did not want to go to [facility], said she needs to get her clothing from her mother's house, did not want to discuss shelters.   Supervisor then contacted pt's mother and informed her of this discussion.  Mother became upset, wanted to know why pt was not being [facility].  Supervisor spoke with her for some time about MD deciding pt is stable for discharge and [place] trying to work with her on shelter placement.  Mother focused that pt "needs more help" and shouldn't be discharged, stated she will "just go back to magistrate and get them to send pt to [place]." Garner Nash, MSW, LCSW Advanced Care Supervisor 05/02/2019 9:36 AM   Spoke to patient again, she states her mother is refusing to allow her to pick up her car "and I have the title".  Pt continues to refuse to go out of county for shelter, states she will "figure it out when I get my car."  Supervisor spoke to [employer] at [employer] available.  Supervisor spoke to [facility] house/[facility]-they are full.  Supervisor left message with [facility] regarding [employer].  Supervisor received second call from pt mother, who is more upset now about pt discharge. Garner Nash, MSW, LCSW Advanced Care Supervisor 05/02/2019 10:46 AM   Supervisor spoke to [facility] non-emergency who can meet pt at the home of her mother to be present when pt collects her things.  They asked that we call them when pt is leaving and they will meet pt at the residence.  Supervisor confirmed that [address] [employer] in in the city of [place].   Supervisor spoke to pt mother and explained this plan and she was agreeable and said this would be fine.   Pt given information about [facility] for homeless resources as well. Garner Nash, MSW, LCSW Advanced Care Supervisor 05/02/2019 12:57 PM

## 2019-05-02 NOTE — Progress Notes (Signed)
Patient denied SI and HI, contracts for safety.  Denied A/V hallucinations.  Denied pain. Medications administered per MD orders.  Emotional support and encouragement given patient. Safety maintained with 15 minute checks.   

## 2019-05-02 NOTE — Progress Notes (Signed)
Spirituality group facilitated by Chaplain Coraima Tibbs, MDiv, BCC.  Group focused on the topic of "hope."   Patients were led in a facilitated discussion, creating a word cloud to respond to the prompt "Hope is."   Patients named experiences both past and present that inform their understanding of hope  Patients engaged in a visual explorer activity - choosing a photo that represented hope for today.    DID NOT ATTEND  

## 2019-05-02 NOTE — BHH Suicide Risk Assessment (Signed)
[facility] Discharge Suicide Risk Assessment   Principal Problem: Schizophrenia [facility]) Discharge Diagnoses: Principal Problem:   Schizophrenia (HCC) Active Problems:   Bipolar disorder (HCC)   Diabetes mellitus type 2 in obese (HCC)   Total Time spent with patient: 45 minutes Musculoskeletal: Strength & Muscle Tone: within normal limits Gait & Station: normal Patient leans: N/A  Psychiatric Specialty Exam: Physical Exam  Review of Systems  Blood pressure 110/80, pulse 79, temperature 97.7 F (36.5 C), temperature source Oral, resp. rate 20, SpO2 98 %.There is no height or weight on file to calculate BMI.  General Appearance: Casual  Eye Contact:  Poor  Speech:  Normal Rate  Volume:  Normal  Mood:  Irritable  Affect:  Congruent  Thought Process:  Coherent and Goal Directed  Orientation:  Full (Time, Place, and Person)  Thought Content:  No positive symptoms  Suicidal Thoughts:  No  Homicidal Thoughts:  No  Memory:  Immediate;   Poor Recent;   Fair Remote;   Fair  Judgement:  Fair  Insight:  Fair  Psychomotor Activity:  Normal  Concentration:  Concentration: Fair and Attention Span: Fair  Recall:  [employer] of Knowledge:  Fair  Language:  Fair  Akathisia:  Negative  Handed:  Right  AIMS (if indicated):     Assets:  Leisure Time Physical Health Resilience  ADL's:  Intact  Cognition:  WNL  Sleep:  Number of Hours: 7       Mental Status Per Nursing Assessment::   On Admission:  Plan to harm others  Demographic Factors:  Low socioeconomic status, Living alone and Unemployed  Loss Factors: Decrease in vocational status  Historical Factors: Impulsivity  Risk Reduction Factors:   Sense of responsibility to family  Continued Clinical Symptoms:  Previous Psychiatric Diagnoses and Treatments Medical Diagnoses and Treatments/Surgeries  Cognitive Features That Contribute To Risk:  Loss of executive function    Suicide Risk:  Minimal: No identifiable suicidal  ideation.  Patients presenting with no risk factors but with morbid ruminations; may be classified as minimal risk based on the severity of the depressive symptoms    Plan Of Care/Follow-up recommendations:  Activity:  full  Morghan Kester, MD 05/02/2019, 8:20 AM

## 2019-06-23 ENCOUNTER — Emergency Department (HOSPITAL_COMMUNITY): Payer: Medicare Other

## 2019-06-23 ENCOUNTER — Other Ambulatory Visit: Payer: Self-pay

## 2019-06-23 ENCOUNTER — Encounter (HOSPITAL_COMMUNITY): Payer: Self-pay

## 2019-06-23 ENCOUNTER — Inpatient Hospital Stay (HOSPITAL_COMMUNITY)
Admission: EM | Admit: 2019-06-23 | Discharge: 2019-07-24 | DRG: 853 | Disposition: A | Discharge status: Skilled Nursing Facility | Payer: Medicare Other | Attending: Internal Medicine | Admitting: Internal Medicine

## 2019-06-23 DIAGNOSIS — R000 Tachycardia, unspecified: Secondary | ICD-10-CM | POA: Diagnosis not present

## 2019-06-23 DIAGNOSIS — Z6841 Body Mass Index (BMI) 40.0 and over, adult: Secondary | ICD-10-CM

## 2019-06-23 DIAGNOSIS — E876 Hypokalemia: Secondary | ICD-10-CM | POA: Diagnosis not present

## 2019-06-23 DIAGNOSIS — L02214 Cutaneous abscess of groin: Secondary | ICD-10-CM | POA: Diagnosis not present

## 2019-06-23 DIAGNOSIS — R159 Full incontinence of feces: Secondary | ICD-10-CM | POA: Diagnosis not present

## 2019-06-23 DIAGNOSIS — R34 Anuria and oliguria: Secondary | ICD-10-CM | POA: Diagnosis not present

## 2019-06-23 DIAGNOSIS — Z789 Other specified health status: Secondary | ICD-10-CM

## 2019-06-23 DIAGNOSIS — J9600 Acute respiratory failure, unspecified whether with hypoxia or hypercapnia: Secondary | ICD-10-CM

## 2019-06-23 DIAGNOSIS — M79661 Pain in right lower leg: Secondary | ICD-10-CM | POA: Diagnosis not present

## 2019-06-23 DIAGNOSIS — K810 Acute cholecystitis: Secondary | ICD-10-CM

## 2019-06-23 DIAGNOSIS — L89329 Pressure ulcer of left buttock, unspecified stage: Secondary | ICD-10-CM | POA: Diagnosis present

## 2019-06-23 DIAGNOSIS — R21 Rash and other nonspecific skin eruption: Secondary | ICD-10-CM | POA: Diagnosis present

## 2019-06-23 DIAGNOSIS — G9341 Metabolic encephalopathy: Secondary | ICD-10-CM | POA: Diagnosis not present

## 2019-06-23 DIAGNOSIS — T383X6A Underdosing of insulin and oral hypoglycemic [antidiabetic] drugs, initial encounter: Secondary | ICD-10-CM | POA: Diagnosis present

## 2019-06-23 DIAGNOSIS — Z91138 Patient's unintentional underdosing of medication regimen for other reason: Secondary | ICD-10-CM

## 2019-06-23 DIAGNOSIS — R269 Unspecified abnormalities of gait and mobility: Secondary | ICD-10-CM | POA: Diagnosis not present

## 2019-06-23 DIAGNOSIS — M726 Necrotizing fasciitis: Secondary | ICD-10-CM | POA: Diagnosis present

## 2019-06-23 DIAGNOSIS — A419 Sepsis, unspecified organism: Principal | ICD-10-CM | POA: Diagnosis present

## 2019-06-23 DIAGNOSIS — K8000 Calculus of gallbladder with acute cholecystitis without obstruction: Secondary | ICD-10-CM | POA: Diagnosis not present

## 2019-06-23 DIAGNOSIS — D72829 Elevated white blood cell count, unspecified: Secondary | ICD-10-CM

## 2019-06-23 DIAGNOSIS — E1110 Type 2 diabetes mellitus with ketoacidosis without coma: Secondary | ICD-10-CM | POA: Diagnosis present

## 2019-06-23 DIAGNOSIS — T8130XA Disruption of wound, unspecified, initial encounter: Secondary | ICD-10-CM | POA: Diagnosis not present

## 2019-06-23 DIAGNOSIS — Y838 Other surgical procedures as the cause of abnormal reaction of the patient, or of later complication, without mention of misadventure at the time of the procedure: Secondary | ICD-10-CM | POA: Diagnosis not present

## 2019-06-23 DIAGNOSIS — E870 Hyperosmolality and hypernatremia: Secondary | ICD-10-CM | POA: Diagnosis not present

## 2019-06-23 DIAGNOSIS — F17200 Nicotine dependence, unspecified, uncomplicated: Secondary | ICD-10-CM | POA: Diagnosis present

## 2019-06-23 DIAGNOSIS — N179 Acute kidney failure, unspecified: Secondary | ICD-10-CM | POA: Diagnosis present

## 2019-06-23 DIAGNOSIS — E861 Hypovolemia: Secondary | ICD-10-CM | POA: Diagnosis not present

## 2019-06-23 DIAGNOSIS — Z79899 Other long term (current) drug therapy: Secondary | ICD-10-CM

## 2019-06-23 DIAGNOSIS — I878 Other specified disorders of veins: Secondary | ICD-10-CM | POA: Diagnosis present

## 2019-06-23 DIAGNOSIS — F319 Bipolar disorder, unspecified: Secondary | ICD-10-CM | POA: Diagnosis present

## 2019-06-23 DIAGNOSIS — I10 Essential (primary) hypertension: Secondary | ICD-10-CM | POA: Diagnosis present

## 2019-06-23 DIAGNOSIS — F209 Schizophrenia, unspecified: Secondary | ICD-10-CM | POA: Diagnosis present

## 2019-06-23 DIAGNOSIS — J9601 Acute respiratory failure with hypoxia: Secondary | ICD-10-CM | POA: Diagnosis not present

## 2019-06-23 DIAGNOSIS — R0603 Acute respiratory distress: Secondary | ICD-10-CM

## 2019-06-23 DIAGNOSIS — D62 Acute posthemorrhagic anemia: Secondary | ICD-10-CM | POA: Diagnosis not present

## 2019-06-23 DIAGNOSIS — R601 Generalized edema: Secondary | ICD-10-CM | POA: Diagnosis not present

## 2019-06-23 DIAGNOSIS — K5289 Other specified noninfective gastroenteritis and colitis: Secondary | ICD-10-CM | POA: Diagnosis not present

## 2019-06-23 DIAGNOSIS — L03115 Cellulitis of right lower limb: Secondary | ICD-10-CM | POA: Diagnosis present

## 2019-06-23 DIAGNOSIS — R6521 Severe sepsis with septic shock: Secondary | ICD-10-CM | POA: Diagnosis not present

## 2019-06-23 DIAGNOSIS — R32 Unspecified urinary incontinence: Secondary | ICD-10-CM | POA: Diagnosis not present

## 2019-06-23 DIAGNOSIS — Z978 Presence of other specified devices: Secondary | ICD-10-CM

## 2019-06-23 DIAGNOSIS — Z20822 Contact with and (suspected) exposure to covid-19: Secondary | ICD-10-CM | POA: Diagnosis present

## 2019-06-23 DIAGNOSIS — Z452 Encounter for adjustment and management of vascular access device: Secondary | ICD-10-CM

## 2019-06-23 DIAGNOSIS — Z794 Long term (current) use of insulin: Secondary | ICD-10-CM

## 2019-06-23 DIAGNOSIS — E1111 Type 2 diabetes mellitus with ketoacidosis with coma: Secondary | ICD-10-CM

## 2019-06-23 DIAGNOSIS — E8809 Other disorders of plasma-protein metabolism, not elsewhere classified: Secondary | ICD-10-CM | POA: Diagnosis not present

## 2019-06-23 DIAGNOSIS — R110 Nausea: Secondary | ICD-10-CM | POA: Diagnosis not present

## 2019-06-23 DIAGNOSIS — L0390 Cellulitis, unspecified: Secondary | ICD-10-CM

## 2019-06-23 HISTORY — DX: Schizophrenia, unspecified: F20.9

## 2019-06-23 HISTORY — DX: Bipolar disorder, unspecified: F31.9

## 2019-06-23 LAB — OSMOLALITY: Osmolality: 353 mOsm/kg (ref 275–295)

## 2019-06-23 LAB — CBC WITH DIFFERENTIAL/PLATELET
Abs Immature Granulocytes: 2.66 10*3/uL — ABNORMAL HIGH (ref 0.00–0.07)
Basophils Absolute: 0 10*3/uL (ref 0.0–0.1)
Basophils Relative: 0 %
Eosinophils Absolute: 0 10*3/uL (ref 0.0–0.5)
Eosinophils Relative: 0 %
HCT: 45.7 % (ref 36.0–46.0)
Hemoglobin: 13.7 g/dL (ref 12.0–15.0)
Immature Granulocytes: 10 %
Lymphocytes Relative: 3 %
Lymphs Abs: 0.7 10*3/uL (ref 0.7–4.0)
MCH: 26.7 pg (ref 26.0–34.0)
MCHC: 30 g/dL (ref 30.0–36.0)
MCV: 89.1 fL (ref 80.0–100.0)
Monocytes Absolute: 1.6 10*3/uL — ABNORMAL HIGH (ref 0.1–1.0)
Monocytes Relative: 6 %
Neutro Abs: 22.4 10*3/uL — ABNORMAL HIGH (ref 1.7–7.7)
Neutrophils Relative %: 81 %
Platelets: 314 10*3/uL (ref 150–400)
RBC: 5.13 MIL/uL — ABNORMAL HIGH (ref 3.87–5.11)
RDW: 15.9 % — ABNORMAL HIGH (ref 11.5–15.5)
WBC: 27.3 10*3/uL — ABNORMAL HIGH (ref 4.0–10.5)
nRBC: 0.3 % — ABNORMAL HIGH (ref 0.0–0.2)

## 2019-06-23 LAB — I-STAT BETA HCG BLOOD, ED (MC, WL, AP ONLY): I-stat hCG, quantitative: <5 m[IU]/mL (ref <5)

## 2019-06-23 LAB — BASIC METABOLIC PANEL
Anion gap: 20 — ABNORMAL HIGH (ref 5–15)
Anion gap: 16 — ABNORMAL HIGH (ref 5–15)
Anion gap: 12 (ref 5–15)
BUN: 36 mg/dL — ABNORMAL HIGH (ref 6–20)
BUN: 29 mg/dL — ABNORMAL HIGH (ref 6–20)
BUN: 27 mg/dL — ABNORMAL HIGH (ref 6–20)
CO2: 8 mmol/L — ABNORMAL LOW (ref 22–32)
CO2: 10 mmol/L — ABNORMAL LOW (ref 22–32)
CO2: 12 mmol/L — ABNORMAL LOW (ref 22–32)
Calcium: 9.1 mg/dL (ref 8.9–10.3)
Calcium: 9.5 mg/dL (ref 8.9–10.3)
Calcium: 9.4 mg/dL (ref 8.9–10.3)
Chloride: 104 mmol/L (ref 98–111)
Chloride: 116 mmol/L — ABNORMAL HIGH (ref 98–111)
Chloride: 120 mmol/L — ABNORMAL HIGH (ref 98–111)
Creatinine, Ser: 1.71 mg/dL — ABNORMAL HIGH (ref 0.44–1.00)
Creatinine, Ser: 1.23 mg/dL — ABNORMAL HIGH (ref 0.44–1.00)
Creatinine, Ser: 0.98 mg/dL (ref 0.44–1.00)
GFR calc Af Amer: 41 mL/min — ABNORMAL LOW (ref >60)
GFR calc Af Amer: >60 mL/min (ref >60)
GFR calc Af Amer: >60 mL/min (ref >60)
GFR calc non Af Amer: 36 mL/min — ABNORMAL LOW (ref >60)
GFR calc non Af Amer: 53 mL/min — ABNORMAL LOW (ref >60)
GFR calc non Af Amer: >60 mL/min (ref >60)
Glucose, Bld: 842 mg/dL (ref 70–99)
Glucose, Bld: 446 mg/dL — ABNORMAL HIGH (ref 70–99)
Glucose, Bld: 316 mg/dL — ABNORMAL HIGH (ref 70–99)
Potassium: 3.5 mmol/L (ref 3.5–5.1)
Potassium: 2.4 mmol/L — CL (ref 3.5–5.1)
Potassium: 2.9 mmol/L — ABNORMAL LOW (ref 3.5–5.1)
Sodium: 132 mmol/L — ABNORMAL LOW (ref 135–145)
Sodium: 142 mmol/L (ref 135–145)
Sodium: 144 mmol/L (ref 135–145)

## 2019-06-23 LAB — POCT I-STAT 7, (LYTES, BLD GAS, ICA,H+H)
Acid-base deficit: 19 mmol/L — ABNORMAL HIGH (ref 0.0–2.0)
Bicarbonate: 6.4 mmol/L — ABNORMAL LOW (ref 20.0–28.0)
Calcium, Ion: 1.46 mmol/L — ABNORMAL HIGH (ref 1.15–1.40)
HCT: 38 % (ref 36.0–46.0)
Hemoglobin: 12.9 g/dL (ref 12.0–15.0)
O2 Saturation: 96 %
Potassium: 2.6 mmol/L — CL (ref 3.5–5.1)
Sodium: 139 mmol/L (ref 135–145)
TCO2: 7 mmol/L — ABNORMAL LOW (ref 22–32)
pCO2 arterial: 15.6 mmHg — CL (ref 32.0–48.0)
pH, Arterial: 7.22 — ABNORMAL LOW (ref 7.350–7.450)
pO2, Arterial: 94 mmHg (ref 83.0–108.0)

## 2019-06-23 LAB — CBC
HCT: 40 % (ref 36.0–46.0)
Hemoglobin: 12.9 g/dL (ref 12.0–15.0)
MCH: 26.7 pg (ref 26.0–34.0)
MCHC: 32.3 g/dL (ref 30.0–36.0)
MCV: 82.6 fL (ref 80.0–100.0)
Platelets: 269 10*3/uL (ref 150–400)
RBC: 4.84 MIL/uL (ref 3.87–5.11)
RDW: 14.7 % (ref 11.5–15.5)
WBC: 23.9 10*3/uL — ABNORMAL HIGH (ref 4.0–10.5)
nRBC: 0.2 % (ref 0.0–0.2)

## 2019-06-23 LAB — APTT: aPTT: 26 seconds (ref 24–36)

## 2019-06-23 LAB — GLUCOSE, CAPILLARY
Glucose-Capillary: 389 mg/dL — ABNORMAL HIGH (ref 70–99)
Glucose-Capillary: 440 mg/dL — ABNORMAL HIGH (ref 70–99)
Glucose-Capillary: 373 mg/dL — ABNORMAL HIGH (ref 70–99)
Glucose-Capillary: 302 mg/dL — ABNORMAL HIGH (ref 70–99)
Glucose-Capillary: 270 mg/dL — ABNORMAL HIGH (ref 70–99)

## 2019-06-23 LAB — URINALYSIS, ROUTINE W REFLEX MICROSCOPIC
Bilirubin Urine: NEGATIVE
Glucose, UA: >=500 mg/dL — AB
Ketones, ur: 20 mg/dL — AB
Nitrite: NEGATIVE
Protein, ur: 30 mg/dL — AB
RBC / HPF: >50 RBC/hpf — ABNORMAL HIGH (ref 0–5)
Specific Gravity, Urine: 1.028 (ref 1.005–1.030)
WBC, UA: >50 WBC/hpf — ABNORMAL HIGH (ref 0–5)
pH: 6 (ref 5.0–8.0)

## 2019-06-23 LAB — PROTIME-INR
INR: 1.6 — ABNORMAL HIGH (ref 0.8–1.2)
Prothrombin Time: 18.5 seconds — ABNORMAL HIGH (ref 11.4–15.2)

## 2019-06-23 LAB — LACTIC ACID, PLASMA
Lactic Acid, Venous: 4.1 mmol/L (ref 0.5–1.9)
Lactic Acid, Venous: 3.9 mmol/L (ref 0.5–1.9)
Lactic Acid, Venous: 3.4 mmol/L (ref 0.5–1.9)

## 2019-06-23 LAB — POC SARS CORONAVIRUS 2 AG -  ED: SARS Coronavirus 2 Ag: NEGATIVE

## 2019-06-23 LAB — BETA-HYDROXYBUTYRIC ACID
Beta-Hydroxybutyric Acid: 6.44 mmol/L — ABNORMAL HIGH (ref 0.05–0.27)
Beta-Hydroxybutyric Acid: 5.8 mmol/L — ABNORMAL HIGH (ref 0.05–0.27)

## 2019-06-23 LAB — CBG MONITORING, ED
Glucose-Capillary: >600 mg/dL (ref 70–99)
Glucose-Capillary: >600 mg/dL (ref 70–99)
Glucose-Capillary: >600 mg/dL (ref 70–99)
Glucose-Capillary: 491 mg/dL — ABNORMAL HIGH (ref 70–99)

## 2019-06-23 LAB — COMPREHENSIVE METABOLIC PANEL
ALT: 26 U/L (ref 0–44)
AST: 27 U/L (ref 15–41)
Albumin: 1.6 g/dL — ABNORMAL LOW (ref 3.5–5.0)
Alkaline Phosphatase: 149 U/L — ABNORMAL HIGH (ref 38–126)
BUN: 36 mg/dL — ABNORMAL HIGH (ref 6–20)
CO2: <7 mmol/L — ABNORMAL LOW (ref 22–32)
Calcium: 9.7 mg/dL (ref 8.9–10.3)
Chloride: 102 mmol/L (ref 98–111)
Creatinine, Ser: 1.91 mg/dL — ABNORMAL HIGH (ref 0.44–1.00)
GFR calc Af Amer: 36 mL/min — ABNORMAL LOW (ref >60)
GFR calc non Af Amer: 31 mL/min — ABNORMAL LOW (ref >60)
Glucose, Bld: 1023 mg/dL (ref 70–99)
Potassium: 3.5 mmol/L (ref 3.5–5.1)
Sodium: 132 mmol/L — ABNORMAL LOW (ref 135–145)
Total Bilirubin: 1.2 mg/dL (ref 0.3–1.2)
Total Protein: 7.2 g/dL (ref 6.5–8.1)

## 2019-06-23 LAB — ETHANOL: Alcohol, Ethyl (B): <10 mg/dL (ref <10)

## 2019-06-23 LAB — RESPIRATORY PANEL BY RT PCR (FLU A&B, COVID)
Influenza A by PCR: NEGATIVE
Influenza B by PCR: NEGATIVE
SARS Coronavirus 2 by RT PCR: NEGATIVE

## 2019-06-23 LAB — HIV ANTIBODY (ROUTINE TESTING W REFLEX): HIV Screen 4th Generation wRfx: NONREACTIVE

## 2019-06-23 LAB — VALPROIC ACID LEVEL: Valproic Acid Lvl: <10 ug/mL — ABNORMAL LOW (ref 50.0–100.0)

## 2019-06-23 LAB — SURGICAL PCR SCREEN
MRSA, PCR: POSITIVE — AB
Staphylococcus aureus: POSITIVE — AB

## 2019-06-23 LAB — AMMONIA: Ammonia: 72 umol/L — ABNORMAL HIGH (ref 9–35)

## 2019-06-23 MED ORDER — LACTATED RINGERS IV BOLUS
1000.0000 mL | Freq: Once | INTRAVENOUS | Status: AC
  Administered 2019-06-23: 15:00:00 1000 mL via INTRAVENOUS

## 2019-06-23 MED ORDER — ACETAMINOPHEN 650 MG RE SUPP
650.0000 mg | Freq: Once | RECTAL | Status: AC
  Administered 2019-06-23: 650 mg via RECTAL
  Filled 2019-06-23: qty 1

## 2019-06-23 MED ORDER — POTASSIUM CHLORIDE 10 MEQ/100ML IV SOLN
10.0000 meq | INTRAVENOUS | Status: AC
  Administered 2019-06-23: 10 meq via INTRAVENOUS
  Filled 2019-06-23 (×2): qty 100

## 2019-06-23 MED ORDER — SODIUM CHLORIDE 0.9 % IV SOLN: INTRAVENOUS | Status: DC

## 2019-06-23 MED ORDER — DEXTROSE-NACL 5-0.45 % IV SOLN: INTRAVENOUS | Status: DC

## 2019-06-23 MED ORDER — IOHEXOL 300 MG/ML  SOLN
80.0000 mL | Freq: Once | INTRAMUSCULAR | Status: AC | PRN
  Administered 2019-06-23: 80 mL via INTRAVENOUS

## 2019-06-23 MED ORDER — VANCOMYCIN HCL 2000 MG/400ML IV SOLN
2000.0000 mg | Freq: Once | INTRAVENOUS | Status: AC
  Administered 2019-06-23: 2000 mg via INTRAVENOUS
  Filled 2019-06-23: qty 400

## 2019-06-23 MED ORDER — ACETAMINOPHEN 325 MG PO TABS
650.0000 mg | ORAL_TABLET | Freq: Once | ORAL | Status: DC
Start: 2019-06-23 — End: 2019-06-24
  Filled 2019-06-23: qty 2

## 2019-06-23 MED ORDER — HEPARIN SODIUM (PORCINE) 5000 UNIT/ML IJ SOLN
5000.0000 [IU] | Freq: Three times a day (TID) | INTRAMUSCULAR | Status: AC
  Administered 2019-06-23 (×2): 5000 [IU] via SUBCUTANEOUS
  Filled 2019-06-23 (×2): qty 1

## 2019-06-23 MED ORDER — POTASSIUM CHLORIDE CRYS ER 20 MEQ PO TBCR: 40.0000 meq | EXTENDED_RELEASE_TABLET | Freq: Once | ORAL | Status: DC

## 2019-06-23 MED ORDER — ACETAMINOPHEN 325 MG PO TABS: 650.0000 mg | ORAL_TABLET | ORAL | Status: DC | PRN

## 2019-06-23 MED ORDER — POTASSIUM CHLORIDE 10 MEQ/100ML IV SOLN
10.0000 meq | INTRAVENOUS | Status: AC
  Administered 2019-06-23 – 2019-06-24 (×6): 10 meq via INTRAVENOUS
  Filled 2019-06-23 (×5): qty 100

## 2019-06-23 MED ORDER — SODIUM CHLORIDE 0.9 % IV SOLN
2.0000 g | Freq: Once | INTRAVENOUS | Status: AC
  Administered 2019-06-23: 2 g via INTRAVENOUS
  Filled 2019-06-23: qty 2

## 2019-06-23 MED ORDER — ORAL CARE MOUTH RINSE
15.0000 mL | Freq: Two times a day (BID) | OROMUCOSAL | Status: DC
  Administered 2019-06-24 – 2019-07-15 (×24): 15 mL via OROMUCOSAL

## 2019-06-23 MED ORDER — METRONIDAZOLE IN NACL 5-0.79 MG/ML-% IV SOLN
500.0000 mg | Freq: Once | INTRAVENOUS | Status: AC
  Administered 2019-06-23: 15:00:00 500 mg via INTRAVENOUS
  Filled 2019-06-23: qty 100

## 2019-06-23 MED ORDER — VANCOMYCIN HCL IN DEXTROSE 1-5 GM/200ML-% IV SOLN: 1000.0000 mg | Freq: Once | INTRAVENOUS | Status: DC

## 2019-06-23 MED ORDER — LACTATED RINGERS IV BOLUS
1000.0000 mL | Freq: Once | INTRAVENOUS | Status: AC
  Administered 2019-06-23: 1000 mL via INTRAVENOUS

## 2019-06-23 MED ORDER — MUPIROCIN 2 % EX OINT
1.0000 "application " | TOPICAL_OINTMENT | Freq: Two times a day (BID) | CUTANEOUS | Status: AC
  Administered 2019-06-23 – 2019-06-28 (×10): 1 via NASAL
  Filled 2019-06-23: qty 22

## 2019-06-23 MED ORDER — VANCOMYCIN HCL 10 G IV SOLR
2000.0000 mg | Freq: Once | INTRAVENOUS | Status: DC
  Filled 2019-06-23: qty 2000

## 2019-06-23 MED ORDER — CHLORHEXIDINE GLUCONATE 0.12 % MT SOLN
15.0000 mL | Freq: Two times a day (BID) | OROMUCOSAL | Status: DC
  Administered 2019-06-23 – 2019-07-15 (×33): 15 mL via OROMUCOSAL
  Filled 2019-06-23 (×33): qty 15

## 2019-06-23 MED ORDER — LACTATED RINGERS IV BOLUS
1000.0000 mL | Freq: Once | INTRAVENOUS | Status: AC
  Administered 2019-06-23: 18:00:00 1000 mL via INTRAVENOUS

## 2019-06-23 MED ORDER — SODIUM CHLORIDE 0.9 % IV BOLUS
1000.0000 mL | Freq: Once | INTRAVENOUS | Status: AC
  Administered 2019-06-23: 1000 mL via INTRAVENOUS

## 2019-06-23 MED ORDER — DEXTROSE 50 % IV SOLN: 0.0000 mL | INTRAVENOUS | Status: DC | PRN

## 2019-06-23 MED ORDER — CLINDAMYCIN PHOSPHATE 600 MG/50ML IV SOLN
600.0000 mg | Freq: Three times a day (TID) | INTRAVENOUS | Status: DC
  Administered 2019-06-23 – 2019-06-24 (×3): 600 mg via INTRAVENOUS
  Filled 2019-06-23 (×4): qty 50

## 2019-06-23 MED ORDER — INSULIN REGULAR(HUMAN) IN NACL 100-0.9 UT/100ML-% IV SOLN
INTRAVENOUS | Status: DC
  Administered 2019-06-23: 17:00:00 13 [IU]/h via INTRAVENOUS
  Administered 2019-06-24 (×2): 6.5 [IU]/h via INTRAVENOUS
  Filled 2019-06-23 (×5): qty 100

## 2019-06-23 MED ORDER — SODIUM CHLORIDE 0.9 % IV SOLN
2.0000 g | Freq: Two times a day (BID) | INTRAVENOUS | Status: DC
  Administered 2019-06-24: 2 g via INTRAVENOUS
  Filled 2019-06-23: qty 2

## 2019-06-23 MED ORDER — POLYETHYLENE GLYCOL 3350 17 G PO PACK: 17.0000 g | PACK | Freq: Every day | ORAL | Status: DC | PRN

## 2019-06-23 MED ORDER — VANCOMYCIN HCL 750 MG/150ML IV SOLN: 750.0000 mg | Freq: Two times a day (BID) | INTRAVENOUS | Status: DC

## 2019-06-23 MED ORDER — DOCUSATE SODIUM 100 MG PO CAPS: 100.0000 mg | ORAL_CAPSULE | Freq: Two times a day (BID) | ORAL | Status: DC | PRN

## 2019-06-23 NOTE — Progress Notes (Signed)
Notified bedside nurse of need to draw repeat lactic acid. 

## 2019-06-23 NOTE — Progress Notes (Signed)
Abnormal ABG results called to  Lorin Glass, MD. RBV no new orders at this time.

## 2019-06-23 NOTE — Progress Notes (Signed)
eLink Physician-Brief Progress Note Patient Name: Debborah Alonge DOB: October 21, 1975 MRN: [id number]   Date of Service  06/23/2019  HPI/Events of Note  Lactic Acid = 4.1 --> 3.9.   eICU Interventions  Will order: 1. Repeat Lactic Acid level at 11 PM.     Intervention Category Major Interventions: Acid-Base disturbance - evaluation and management  Lenell Antu 06/23/2019, 7:53 PM

## 2019-06-23 NOTE — Progress Notes (Signed)
Dr. Huel Cote spoke to patient's mother Kaianna Dolezal and updated her on patient's conditions. All questions were answered.

## 2019-06-23 NOTE — H&P (Signed)
NAME:  Kimberly Orozco, MRN:  [id number], DOB:  1976-02-03, LOS: 0 ADMISSION DATE:  06/23/2019, CONSULTATION DATE:  06/23/2019 REFERRING MD:  Dr. Regenia Skeeter CHIEF COMPLAINT:  AMS   Brief History   [name] is a 44 y/o female with a PMHx of T2DM, Schizophrenia, Bipolar Disorder who presents to the ED with c/o AMS and found to be in diabetic ketoacidosis complicated by necrotizing soft tissue infection.   History of present illness   [name] is a 44 y/o female with a PMHx of T2DM, Schizophrenia, Bipolar Disorder who presents to the ED with c/o AMS.   Per chart review, patient has been experiencing confusion for the past few days. This is in light of missing her diabetic (metformin and insulin) and psychiatric medications for the last 1 month. Ms. Bakke denies any pain at this time. She is unaware of any rash or skin lesions, denies any trauma to her groin or abdomen.   Past Medical History  - T2DM - Schizophrenia  - Bipolar Disorder   Significant Hospital Events   4/12: Admitted to the ICU  Consults:  General Surgery  Procedures:  None   Significant Diagnostic Tests:   CT abdomen/pelvis (4/12):  1. Extensive dermal thickening and subcutaneous fat stranding within the lower anterior abdominal wall, bilateral inguinal regions, and right-side perineum consistent with infection. No fluid collection or abscess. 2. Fibroid uterus. 3. Cholelithiasis without cholecystitis.  CT Femur (4/12):  1. Dermal thickening and subcutaneous fat stranding extending from the lower anterior abdominal wall through the right inguinal region and perineum, and involving the anterior proximal right thigh. Findings are consistent with infection. No fluid collection or abscess. 2. Significant osteoarthritis of the right hip and knee. 3. Reactive lymph nodes right inguinal region.  Micro Data:  Blood cultures (4/12): Pending Urine Cultures (4/12): Pending   Antimicrobials:    Metronidazole x1 (4/12) Cefepime (4/12) >> Vancomycin (4/12) >> Clindamycin (4/12) >>  Interim history/subjective:  See Above  Objective   Blood pressure 111/65, pulse (!) 131, temperature (!) 101.6 F (38.7 C), temperature source Rectal, resp. rate (!) 32, height 5\' 7"  (1.702 m), weight 90.7 kg, SpO2 99 %.        Intake/Output Summary (Last 24 hours) at 06/23/2019 1736 Last data filed at 06/23/2019 1720 Gross per 24 hour  Intake --  Output 600 ml  Net -600 ml   Filed Weights   06/23/19 1436  Weight: 90.7 kg   Physical Exam Vitals and nursing note reviewed.  Constitutional:      Appearance: She is obese. She is ill-appearing and toxic-appearing. She is not diaphoretic.  HENT:     Head: Normocephalic and atraumatic.     Mouth/Throat:     Mouth: Mucous membranes are dry.     Pharynx: Oropharynx is clear. No oropharyngeal exudate or posterior oropharyngeal erythema.  Eyes:     General: No scleral icterus.       Right eye: No discharge.        Left eye: No discharge.     Pupils: Pupils are equal, round, and reactive to light.  Cardiovascular:     Rate and Rhythm: Regular rhythm. Tachycardia present.     Heart sounds: Heart sounds are distant. No murmur. No gallop.   Pulmonary:     Effort: Tachypnea present. No accessory muscle usage.     Breath sounds: No decreased breath sounds, wheezing, rhonchi or rales.  Abdominal:     General: Bowel sounds are decreased.  There is no distension.     Palpations: Abdomen is soft.     Tenderness: There is no abdominal tenderness. There is no guarding.  Musculoskeletal:     Right lower leg: No edema.     Left lower leg: No edema.  Skin:    General: Skin is warm and dry.     Findings: Erythema present.     Comments: Extensive erythema expanding from the groin and radiating upwards in bilateral lower quadrants of her abdomen, into her right lower extremity, and around to the perirectal region.  Several necrotic fluid-filled bullae,  approximately 2 cm on the superior portion of the right thigh.  Ulcer present on right buttocks with subcutaneous fat present.  Significant serosanguineous fluid present throughout, singular source unfounded, likely widespread oozing present.  See pictures below.  Neurological:     Mental Status: She is lethargic.  Psychiatric:        Behavior: Behavior is cooperative.          Resolved Hospital Problem list   N/A  Assessment & Plan:   # Diabetic Ketoacidosis In light of 1 month medication noncompliance, exacerbated by current skin infection.  - Insulin gtt - s/p 3 L bolus - 1 L NS bolus followed by NS 150 cc/hr - Switch to D5-1/2NS once CBG below 250 - Consider sodium bicarb drip if no improvements in bicarb of pH  # Necrotizing Soft Tissue Infection Necrotizing cellulitis versus fascitis. Likely exacerbated by poorly controlled diabetes General surgery will take to OR for I&D tomorrow for further investigation.  - Vancomycin, Cefepime, Clindamycin per pharmacy - General surgery following; appreciate their recommendations - Tylenol for febrile episodes  - IVF  # Acute Renal Failure  - Secondary to hypovolemia  - Aggressive fluid resuscitation - Monitor urine output  - Trend creatinine   # Hypokalemia  - Monitor closely while on insulin gtt - s/p 20 mEq of KCl  - 60 mEq of KCl ordered  - 40 mEq Kdur ordered  Best practice:  Diet: NPO Pain/Anxiety/Delirium protocol (if indicated): N/A VAP protocol (if indicated): N/A DVT prophylaxis: Heparin GI prophylaxis: N/A Glucose control: Insulin gtt Mobility: Bedrest Code Status: FULL Family Communication: Mother updated via telephone on 4/12 Disposition: ICU  Labs   CBC: Recent Labs  Lab 06/23/19 1500  WBC 27.3*  NEUTROABS 22.4*  HGB 13.7  HCT 45.7  MCV 89.1  PLT 314    Basic Metabolic Panel: Recent Labs  Lab 06/23/19 1500  NA 132*  K 3.5  CL 102  CO2 <7*  GLUCOSE 1,023*  BUN 36*  CREATININE  1.91*  CALCIUM 9.7   GFR: Estimated Creatinine Clearance: 43.4 mL/min (A) (by C-G formula based on SCr of 1.91 mg/dL (H)). Recent Labs  Lab 06/23/19 1500  WBC 27.3*  LATICACIDVEN 4.1*    Liver Function Tests: Recent Labs  Lab 06/23/19 1500  AST 27  ALT 26  ALKPHOS 149*  BILITOT 1.2  PROT 7.2  ALBUMIN 1.6*   No results for input(s): LIPASE, AMYLASE in the last 168 hours. Recent Labs  Lab 06/23/19 1501  AMMONIA 72*    ABG No results found for: PHART, PCO2ART, PO2ART, HCO3, TCO2, ACIDBASEDEF, O2SAT   Coagulation Profile: Recent Labs  Lab 06/23/19 1500  INR 1.6*    Cardiac Enzymes: No results for input(s): CKTOTAL, CKMB, CKMBINDEX, TROPONINI in the last 168 hours.  HbA1C: Hgb A1c MFr Bld  Date/Time Value Ref Range Status  04/27/2019 02:17 PM 14.4 (H) 4.8 -  5.6 % Final    Comment:    (NOTE) Pre diabetes:          5.7%-6.4% Diabetes:              >6.4% Glycemic control for   <7.0% adults with diabetes     CBG: Recent Labs  Lab 06/23/19 1509 06/23/19 1655  GLUCAP >600* >600*    Review of Systems:   Negative, except as noted above.   Past Medical History  She,  has no past medical history on file.   Surgical History   History reviewed. No pertinent surgical history.   Social History   reports that she has never smoked. She has never used smokeless tobacco.   Family History   Her family history is not on file.   Allergies No Known Allergies   Home Medications  Prior to Admission medications   Medication Sig Start Date End Date Taking? Authorizing Provider  acetaminophen (TYLENOL) 325 MG tablet Take 650 mg by mouth every 6 (six) hours as needed for mild pain or headache.    [provider]  benztropine (COGENTIN) 1 MG tablet Take 1 tablet (1 mg total) by mouth 2 (two) times daily. 05/02/19   Malvin Johns, MD  cloNIDine (CATAPRES) 0.2 MG tablet Take 1 tablet (0.2 mg total) by mouth 2 (two) times daily. 05/02/19   Malvin Johns, MD    divalproex (DEPAKOTE SPRINKLE) 125 MG capsule Take 2 capsules (250 mg total) by mouth 2 (two) times daily. 05/02/19   Malvin Johns, MD  haloperidol (HALDOL) 10 MG tablet 1 in am 2 at hs Patient taking differently: Take 10 mg by mouth See admin instructions. 10mg  in in the morning, and 20mg  at bedtime. 05/02/19   , MD  haloperidol decanoate (HALDOL DECANOATE) 100 MG/ML injection Inject 1 mL (100 mg total) into the muscle every 30 (thirty) days. Due 3/15 05/29/19   4/15, MD  insulin aspart (NOVOLOG) 100 UNIT/ML injection Inject 4 Units into the skin 3 (three) times daily with meals. 05/02/19   Malvin Johns, MD  metFORMIN (GLUCOPHAGE-XR) 500 MG 24 hr tablet Take 1 tablet (500 mg total) by mouth daily with supper. 05/02/19   Malvin Johns, MD  propranolol ER (INDERAL LA) 80 MG 24 hr capsule Take 1 capsule (80 mg total) by mouth daily. 05/02/19 05/01/20  05/04/19, MD  temazepam (RESTORIL) 30 MG capsule Take 1 capsule (30 mg total) by mouth at bedtime. 05/02/19   Malvin Johns, MD    Dr. 05/04/19 Internal Medicine PGY-1  Pager: [phone] 06/23/2019, 5:36 PM

## 2019-06-23 NOTE — ED Notes (Signed)
This RN attempted to call report for this patient; RN will attempt again in 10-15 minutes

## 2019-06-23 NOTE — Consult Note (Signed)
Patient seen and examined with resident.  Agree with their assessment and plan as written.  S: Seen for altered mental status. 44 year old patient with hx of diabetes who stopped taking her meds a month ago presenting with increased somnolence. Full labs pending but appears to be in florid DKA.  In addition she has a severe cellulitis of R leg tracking up to abdomen.  No obvious abscess or gas pockets on CT A/P/RLE.  Seen by general surgery, plan for debridement when more clinically stable.  Patient is clearly confused but will answer simple questions.   O: Blood pressure 111/65, pulse (!) 131, temperature (!) 101.6 F (38.7 C), temperature source Rectal, resp. rate (!) 32, height 5\' 7"  (1.702 m), weight 90.7 kg, SpO2 99 %.  Ill appearing woman mildly tachypneic MM extremely dry Groin region per [place]'s note: some older appearing blisters on R thigh, areas of induration closer to inguinal fold.  Warm to touch, some active weeping and tracking up abdomen.  PH 7.22, bicarb 6.4, glucose >600 BMP pending   A:  - Severe DKA - Cellulitis vs. Early necrotizing fasciitis on RLE - Poor outpatient medical compliance  P:  - Vanc/cefepime/clindamycin - Would prefer her to be a little better optimized metabolically before OR as induction in these patients is dangerous - NP discussed this with [facility] Dr. Celine Mans, plan tentatively for OR in AM - In meantime usual DKA protocol insulin, would be generous with fluids  The patient is critically ill with multiple organ systems failure and requires high complexity decision making for assessment and support, frequent evaluation and titration of therapies, application of advanced monitoring technologies and extensive interpretation of multiple databases. Critical Care Time devoted to patient care services described in this note independent of APP/resident  time is 43 minutes.   06/23/2019 08/23/2019 MD

## 2019-06-23 NOTE — ED Notes (Signed)
This RN attempted to call report again for this pt; this RN will attempt to call report again after 1900.

## 2019-06-23 NOTE — Consult Note (Signed)
Reason for Consult:soft tissue infection Referring Physician: Dr. Evelina Dun is an 44 y.o. female.  HPI: 34F with a 1+ week h/o RLE and abd wall pain.  Pt's hx obtained per chart d/t pt's mental status.  Pt apparently confused over last few days. Pt apparently out of psych meds recently.  She states she has not had any previous infections that required surgery.  Pt in DKA in ED.  Being evaluated by CCM  For admission.  CT of A/P and RLE with what appears to be cellulitis.  Gen Surg consulted for further eval and tx.  History reviewed. No pertinent past medical history.  History reviewed. No pertinent surgical history.  No family history on file.  Social History:  reports that she has never smoked. She has never used smokeless tobacco. No history on file for alcohol and drug.  Allergies: No Known Allergies  Medications: I have reviewed the patient's current medications.  Results for orders placed or performed during the hospital encounter of 06/23/19 (from the past 48 hour(s))  Lactic acid, plasma     Status: Abnormal   Collection Time: 06/23/19  3:00 PM  Result Value Ref Range   Lactic Acid, Venous 4.1 (HH) 0.5 - 1.9 mmol/L    Comment: CRITICAL RESULT CALLED TO, READ BACK BY AND VERIFIED WITH: L.VENEGAS,RN 1612 06/23/19 CLARK,S Performed at [facility] [address] [place], [place] [address]   Comprehensive metabolic panel     Status: Abnormal   Collection Time: 06/23/19  3:00 PM  Result Value Ref Range   Sodium 132 (L) 135 - 145 mmol/L   Potassium 3.5 3.5 - 5.1 mmol/L   Chloride 102 98 - 111 mmol/L   CO2 <7 (L) 22 - 32 mmol/L   Glucose, Bld 1,023 (HH) 70 - 99 mg/dL    Comment: Glucose reference range applies only to samples taken after fasting for at least 8 hours. CRITICAL RESULT CALLED TO, READ BACK BY AND VERIFIED WITH: L.VENEGAS,RN 1613 06/23/19 CLARK,S    BUN 36 (H) 6 - 20 mg/dL   Creatinine, Ser 1.91 (H) 0.44 - 1.00 mg/dL   Calcium 9.7 8.9 -  10.3 mg/dL   Total Protein 7.2 6.5 - 8.1 g/dL   Albumin 1.6 (L) 3.5 - 5.0 g/dL   AST 27 15 - 41 U/L   ALT 26 0 - 44 U/L   Alkaline Phosphatase 149 (H) 38 - 126 U/L   Total Bilirubin 1.2 0.3 - 1.2 mg/dL   GFR calc non Af Amer 31 (L) >60 mL/min   GFR calc Af Amer 36 (L) >60 mL/min   Anion gap NOT CALCULATED 5 - 15    Comment: Performed at [facility], [place] [address], [place], [place] [address]  CBC WITH DIFFERENTIAL     Status: Abnormal   Collection Time: 06/23/19  3:00 PM  Result Value Ref Range   WBC 27.3 (H) 4.0 - 10.5 K/uL    Comment: WHITE COUNT CONFIRMED ON SMEAR   RBC 5.13 (H) 3.87 - 5.11 MIL/uL   Hemoglobin 13.7 12.0 - 15.0 g/dL   HCT 45.7 36.0 - 46.0 %   MCV 89.1 80.0 - 100.0 fL   MCH 26.7 26.0 - 34.0 pg   MCHC 30.0 30.0 - 36.0 g/dL   RDW 15.9 (H) 11.5 - 15.5 %   Platelets 314 150 - 400 K/uL   nRBC 0.3 (H) 0.0 - 0.2 %   Neutrophils Relative % 81 %  Neutro Abs 22.4 (H) 1.7 - 7.7 K/uL   Lymphocytes Relative 3 %   Lymphs Abs 0.7 0.7 - 4.0 K/uL   Monocytes Relative 6 %   Monocytes Absolute 1.6 (H) 0.1 - 1.0 K/uL   Eosinophils Relative 0 %   Eosinophils Absolute 0.0 0.0 - 0.5 K/uL   Basophils Relative 0 %   Basophils Absolute 0.0 0.0 - 0.1 K/uL   Immature Granulocytes 10 %   Abs Immature Granulocytes 2.66 (H) 0.00 - 0.07 K/uL    Comment: Performed at [facility] [address], [place], [place] [address]  APTT     Status: None   Collection Time: 06/23/19  3:00 PM  Result Value Ref Range   aPTT 26 24 - 36 seconds    Comment: Performed at [facility] [address] [place], [place] [address]  Protime-INR     Status: Abnormal   Collection Time: 06/23/19  3:00 PM  Result Value Ref Range   Prothrombin Time 18.5 (H) 11.4 - 15.2 seconds   INR 1.6 (H) 0.8 - 1.2    Comment: (NOTE) INR goal varies based on device and disease states. Performed at [facility], [place] [address], [place], [place] [address]   Ammonia     Status: Abnormal    Collection Time: 06/23/19  3:01 PM  Result Value Ref Range   Ammonia 72 (H) 9 - 35 umol/L    Comment: Performed at [facility], [place] [address], [place], [place] [address]  Ethanol     Status: None   Collection Time: 06/23/19  3:02 PM  Result Value Ref Range   Alcohol, Ethyl (B) <10 <10 mg/dL    Comment: (NOTE) Lowest detectable limit for serum alcohol is 10 mg/dL. For medical purposes only. Performed at [facility], [place] [address], [place], [place] [address]   Valproic acid level     Status: Abnormal   Collection Time: 06/23/19  3:02 PM  Result Value Ref Range   Valproic Acid Lvl <10 (L) 50.0 - 100.0 ug/mL    Comment: RESULTS CONFIRMED BY MANUAL DILUTION Performed at [facility] [address] [place], [place] [address]   I-Stat beta hCG blood, ED     Status: None   Collection Time: 06/23/19  3:05 PM  Result Value Ref Range   I-stat hCG, quantitative <5.0 <5 mIU/mL   Comment 3            Comment:   GEST. AGE      CONC.  (mIU/mL)   <=1 WEEK        5 - 50     2 WEEKS       50 - 500     3 WEEKS       100 - 10,000     4 WEEKS     1,000 - 30,000        FEMALE AND NON-PREGNANT FEMALE:     LESS THAN 5 mIU/mL   CBG monitoring, ED     Status: Abnormal   Collection Time: 06/23/19  3:09 PM  Result Value Ref Range   Glucose-Capillary >600 (HH) 70 - 99 mg/dL    Comment: Glucose reference range applies only to samples taken after fasting for at least 8 hours.  Respiratory Panel by RT PCR (Flu A&B, Covid) - Nasopharyngeal Swab     Status: None   Collection Time: 06/23/19  3:21 PM   Specimen:  Nasopharyngeal Swab  Result Value Ref Range   SARS Coronavirus 2 by RT PCR NEGATIVE NEGATIVE    Comment: (NOTE) SARS-CoV-2 target nucleic acids are NOT DETECTED. The SARS-CoV-2 RNA is generally detectable in upper respiratoy specimens during the acute phase of infection. The lowest concentration of SARS-CoV-2 viral copies this assay can detect is 131 copies/mL. A negative  result does not preclude SARS-Cov-2 infection and should not be used as the sole basis for treatment or other patient management decisions. A negative result may occur with  improper specimen collection/handling, submission of specimen other than nasopharyngeal swab, presence of viral mutation(s) within the areas targeted by this assay, and inadequate number of viral copies (<131 copies/mL). A negative result must be combined with clinical observations, patient history, and epidemiological information. The expected result is Negative. Fact Sheet for Patients:  PinkCheek.be Fact Sheet for Healthcare Providers:  GravelBags.it This test is not yet ap proved or cleared by the [place] FDA and  has been authorized for detection and/or diagnosis of SARS-CoV-2 by FDA under an Emergency Use Authorization (EUA). This EUA will remain  in effect (meaning this test can be used) for the duration of the COVID-19 declaration under Section 564(b)(1) of the Act, 21 U.S.C. section 360bbb-3(b)(1), unless the authorization is terminated or revoked sooner.    Influenza A by PCR NEGATIVE NEGATIVE   Influenza B by PCR NEGATIVE NEGATIVE    Comment: (NOTE) The Xpert Xpress SARS-CoV-2/FLU/RSV assay is intended as an aid in  the diagnosis of influenza from Nasopharyngeal swab specimens and  should not be used as a sole basis for treatment. Nasal washings and  aspirates are unacceptable for Xpert Xpress SARS-CoV-2/FLU/RSV  testing. Fact Sheet for Patients: PinkCheek.be Fact Sheet for Healthcare Providers: GravelBags.it This test is not yet approved or cleared by the [place] FDA and  has been authorized for detection and/or diagnosis of SARS-CoV-2 by  FDA under an Emergency Use Authorization (EUA). This EUA will remain  in effect (meaning this test can be used) for the duration of the    Covid-19 declaration under Section 564(b)(1) of the Act, 21  U.S.C. section 360bbb-3(b)(1), unless the authorization is  terminated or revoked. Performed at [facility], [place] [address], [place], [place] [address]   Osmolality     Status: Abnormal   Collection Time: 06/23/19  3:22 PM  Result Value Ref Range   Osmolality 353 (HH) 275 - 295 mOsm/kg    Comment: REPEATED TO VERIFY CRITICAL RESULT CALLED TO, READ BACK BY AND VERIFIED WITH: VENEGES,L RN @ 3382 06/23/19 LEONARD,A Performed at [facility], [place] [address] [place], [place] [address]   Beta-hydroxybutyric acid     Status: Abnormal   Collection Time: 06/23/19  3:22 PM  Result Value Ref Range   Beta-Hydroxybutyric Acid 6.44 (H) 0.05 - 0.27 mmol/L    Comment: RESULTS CONFIRMED BY MANUAL DILUTION Performed at [facility], [place] [address] [place], [place] [address]   POC SARS Coronavirus 2 Ag-ED - Nasal Swab (BD Veritor Kit)     Status: None   Collection Time: 06/23/19  3:47 PM  Result Value Ref Range   SARS Coronavirus 2 Ag NEGATIVE NEGATIVE    Comment: (NOTE) SARS-CoV-2 antigen NOT DETECTED.  Negative results are presumptive.  Negative results do not preclude SARS-CoV-2 infection and should not be used as the sole basis for treatment or other patient management decisions, including infection  control decisions, particularly in the presence of clinical  signs and  symptoms consistent with COVID-19, or in those who have been in contact with the virus.  Negative results must be combined with clinical observations, patient history, and epidemiological information. The expected result is Negative. Fact Sheet for Patients: PodPark.tn Fact Sheet for Healthcare Providers: GiftContent.is This test is not yet approved or cleared by the [place] FDA and  has been authorized for detection and/or diagnosis of SARS-CoV-2 by FDA under an Emergency Use  Authorization (EUA).  This EUA will remain in effect (meaning this test can be used) for the duration of  the COVID-19 de claration under Section 564(b)(1) of the Act, 21 U.S.C. section 360bbb-3(b)(1), unless the authorization is terminated or revoked sooner.   CBG monitoring, ED     Status: Abnormal   Collection Time: 06/23/19  4:55 PM  Result Value Ref Range   Glucose-Capillary >600 (HH) 70 - 99 mg/dL    Comment: Glucose reference range applies only to samples taken after fasting for at least 8 hours.    CT Head Wo Contrast  Result Date: 06/23/2019 CLINICAL DATA:  Encephalopathy EXAM: CT HEAD WITHOUT CONTRAST TECHNIQUE: Contiguous axial images were obtained from the base of the skull through the vertex without intravenous contrast. COMPARISON:  None. FINDINGS: Brain: No evidence of acute infarction, hemorrhage, hydrocephalus, extra-axial collection or mass lesion/mass effect. Vascular: Atherosclerotic calcification of the carotid siphons. No hyperdense vessel. Skull: No calvarial fracture or suspicious osseous lesion. No scalp swelling or hematoma. Sinuses/Orbits: Minimal thickening in the left maxillary sinus, possibly a Don the genicular origin given periapical lucency of the bilateral maxillary molars. Remaining paranasal sinuses and mastoid air cells are predominantly clear. Debris within the external auditory canals may reflect cerumen impaction. Included orbital structures are unremarkable. Other: None IMPRESSION: 1. No acute intracranial findings. 2. Debris within the external auditory canals may reflect cerumen impaction. 3. Minimal left maxillary sinus disease, possibly odontogenic origin given periapical lucency of the bilateral maxillary molars. Correlate with dental examination. Electronically Signed   By: Lovena Le M.D.   On: 06/23/2019 16:48   CT ABDOMEN PELVIS W CONTRAST  Result Date: 06/23/2019 CLINICAL DATA:  Erysipelas, diabetes, obesity, fever EXAM: CT ABDOMEN AND PELVIS  WITH CONTRAST TECHNIQUE: Multidetector CT imaging of the abdomen and pelvis was performed using the standard protocol following bolus administration of intravenous contrast. CONTRAST:  71m OMNIPAQUE IOHEXOL 300 MG/ML  SOLN COMPARISON:  None. FINDINGS: Lower chest: No acute pleural or parenchymal lung disease. Hepatobiliary: Calcified gallstone is identified without evidence of cholecystitis. Liver enhances normally. No biliary dilation. Pancreas: Unremarkable. No pancreatic ductal dilatation or surrounding inflammatory changes. Evaluation limited by respiratory motion. Spleen: Normal in size without focal abnormality. Adrenals/Urinary Tract: Adrenal glands are unremarkable. Kidneys are normal, without renal calculi, focal lesion, or hydronephrosis. Bladder is unremarkable. Evaluation limited by respiratory motion. Stomach/Bowel: No bowel obstruction or ileus. Normal appendix right lower quadrant. No bowel wall thickening or inflammatory changes. Evaluation limited by respiratory motion. Vascular/Lymphatic: Borderline enlarged lymph nodes are seen within the bilateral inguinal regions, right greater than left, compatible with reactive adenopathy. Largest lymph node measures 13 mm in short axis in the right inguinal region image 87. Vascular structures enhance normally. Reproductive: The uterus is enlarged and heterogeneous consistent with multiple fibroids. Largest fibroid within the uterine fundus measures up to 8.4 cm. There are no adnexal masses. Other: There is extensive dermal thickening and subcutaneous fat stranding within the lower anterior abdominal wall, bilateral inguinal regions, and extending along the right-sided labia. Findings are  consistent with infection given clinical history. No fluid collection or abscess. There is no free fluid or free gas within the abdomen or pelvis. No abdominal wall hernia. Musculoskeletal: No acute or destructive bony lesions. Reconstructed images demonstrate no additional  findings. IMPRESSION: 1. Extensive dermal thickening and subcutaneous fat stranding within the lower anterior abdominal wall, bilateral inguinal regions, and right-side perineum consistent with infection. No fluid collection or abscess. 2. Fibroid uterus. 3. Cholelithiasis without cholecystitis. Electronically Signed   By: Randa Ngo M.D.   On: 06/23/2019 16:52   CT FEMUR RIGHT W CONTRAST  Result Date: 06/23/2019 CLINICAL DATA:  Erysipelas, fever EXAM: CT OF THE LOWER RIGHT EXTREMITY WITH CONTRAST TECHNIQUE: Multidetector CT imaging of the lower right extremity was performed according to the standard protocol following intravenous contrast administration. COMPARISON:  None. CONTRAST:  53m OMNIPAQUE IOHEXOL 300 MG/ML  SOLN FINDINGS: Bones/Joint/Cartilage No acute or destructive bony lesions. Moderate 3 compartmental right knee osteoarthritis greatest in the patellofemoral compartment. There is moderate to severe right hip osteoarthritis with severe joint space narrowing and osteophyte formation. Ligaments Suboptimally assessed by CT. Muscles and Tendons Mild diffuse muscular atrophy.  No focal abnormalities. Soft tissues There is extensive subcutaneous fat stranding and dermal thickening extending along the lower anterior abdominal wall, right inguinal region, right labia, and proximal anterior thigh. Findings are compatible with infection given clinical history. There is no underlying fluid collection or abscess. Small reactive lymph nodes are seen within the right inguinal region, largest measuring 13 mm in short axis. Reconstructed images demonstrate no additional findings. IMPRESSION: 1. Dermal thickening and subcutaneous fat stranding extending from the lower anterior abdominal wall through the right inguinal region and perineum, and involving the anterior proximal right thigh. Findings are consistent with infection. No fluid collection or abscess. 2. Significant osteoarthritis of the right hip and  knee. 3. Reactive lymph nodes right inguinal region. Electronically Signed   By: MRanda NgoM.D.   On: 06/23/2019 16:54   DG Chest Port 1 View  Result Date: 06/23/2019 CLINICAL DATA:  Fever, altered mental status EXAM: PORTABLE CHEST 1 VIEW COMPARISON:  None. FINDINGS: Marked elevation of the right hemidiaphragm, a nonspecific finding. Adjacent atelectatic changes are noted. No consolidative opacity, pneumothorax or effusion. No convincing features of edema. Cardiomediastinal contours are shifted rightward though this is likely positional given the appearance of rotation on this exam. No acute osseous or soft tissue abnormality. IMPRESSION: 1. Marked elevation of the right hemidiaphragm, a nonspecific finding. Adjacent atelectatic changes. No consolidative opacity or effusion. 2. Apparent rightward mediastinal shift, likely positional. Electronically Signed   By: PLovena LeM.D.   On: 06/23/2019 15:42    Review of Systems  Unable to perform ROS: Acuity of condition   Blood pressure 111/65, pulse (!) 131, temperature (!) 101.6 F (38.7 C), temperature source Rectal, resp. rate (!) 32, height _0  (1.702 m), weight 90.7 kg, SpO2 99 %. Physical Exam  Constitutional: Vital signs are normal. She appears well-developed and well-nourished. She appears lethargic.  Conversant No acute distress  Eyes: Lids are normal. No scleral icterus.  Pupils are equal round and reactive No lid lag Moist conjunctiva  Neck: No tracheal tenderness present. No thyromegaly present.  No cervical lymphadenopathy  Cardiovascular: Normal rate, regular rhythm and intact distal pulses.  No murmur heard. Respiratory: Effort normal and breath sounds normal. She has no wheezes. She has no rales.  GI: There is no hepatosplenomegaly. There is no abdominal tenderness. No hernia.    Neurological:  She appears lethargic.  Normal gait and station  Skin: Skin is warm. No rash noted. No cyanosis. Nails show no clubbing.      Normal skin turgor  Psychiatric: Judgment normal.  Appropriate affect    Assessment/Plan: 44 y/o F with lower r abd wall and RLE necrotizing soft tissue infection DKA Sepsis  -Pt to be seen my CCM to tx for DKA -Dr. [place] Silos will assess pt for possible OR debridement when pt stable from physiologic standpoint.  He will talk with Anes. -We will follow   Ralene Ok 06/23/2019, 5:17 PM

## 2019-06-23 NOTE — Progress Notes (Signed)
Pharmacy Antibiotic Note  Kimberly Orozco is a 44 y.o. female admitted on 06/23/2019 with sepsis.  Pharmacy has been consulted for Vancomycin and Cefepime dosing. Pt febrile with Tm 100.6. WBC elevated at 27.3, lactate 4.1, Scr 1.91  Plan: - Vancomycin 2000mg  IV once, then 750mg  IV Q24 hrs (est AUC 476.1, Scr used 1.91, Vd 0.5) - Cefepime 2g IV Q12 hrs - Monitor renal function, cultures/sensitivities, clinical progression - Check vancomycin levels as indicated   Height: 5\' 7"  (170.2 cm) Weight: 90.7 kg (200 lb) IBW/kg (Calculated) : 61.6  Temp (24hrs), Avg:100.6 F (38.1 C), Min:100.6 F (38.1 C), Max:100.6 F (38.1 C)  No results for input(s): WBC, CREATININE, LATICACIDVEN, VANCOTROUGH, VANCOPEAK, VANCORANDOM, GENTTROUGH, GENTPEAK, GENTRANDOM, TOBRATROUGH, TOBRAPEAK, TOBRARND, AMIKACINPEAK, AMIKACINTROU, AMIKACIN in the last 168 hours.  CrCl cannot be calculated (Patient's most recent lab result is older than the maximum 21 days allowed.).    No Known Allergies  Antimicrobials this admission: Vancomycin  4/12 >>  Cefepime 4/12 >>   Dose adjustments this admission: N/A  Microbiology results: 4/12 BCx:  4/12 UCx:    6/12, PharmD PGY1 Pharmacy Resident Phone: [phone] 06/23/2019  2:42 PM  Please check [email or web address] for unit-specific pharmacy phone numbers.

## 2019-06-23 NOTE — ED Provider Notes (Signed)
Sign out note   3:30 PM received sign out from Dr. Criss Alvine. Sepsis, tachy, BP okay, confused but maintaining airway without concerns currently. Started on broad spectrum abx, 2L fluid bolus. CT abd/pelvis and left upper leg to further eval.  Admit after scans.  4:00 PM rechecked, transiently hypotensive, has only received part of bolus. Repeat BP okay.   4:22 PM rechecked, d/w Cr and renal function with CT, okay to proceed for now. Contrast will greatly benefit diagnostically. RN to accompany pt to CT now.  Labs c/w DKA, will start insulin gtt per protocol  4:58 PM reviewed CTs personally, no free air or discrete abscess, will ask general surgery to review case given concern for possible early nec fasciitis; discussed with Derrell Lolling, he will review case and get back to me, placed consult to critical care for admit  5:00 PM Ramirez at bedside, he will d/w partner covering this evening whether to watch or go to OR   5:09 PM d/w  Critical Care, they will come assess, not accepting yet to unit  5:20 PM critical care team at bedside, they will accept to ICU and assume care at this time, DKA, sepsis with cellulitis vs very nec fasc. Gen surgery tentatively taking to OR tomorrow am when more clinically stable.   CRITICAL CARE Performed by: Milagros Loll    This critical care time was in addition to critical care time performed by Dr. Criss Alvine  Total critical care time: 50 minutes  Critical care time was exclusive of separately billable procedures and treating other patients.   Critical care was necessary to treat or prevent imminent or life-threatening deterioration.  Critical care was time spent personally by me on the following activities: development of treatment plan with patient and/or surrogate as well as nursing, discussions with consultants, evaluation of patient's response to treatment, examination of patient, obtaining history from patient or surrogate, ordering and performing  treatments and interventions, ordering and review of laboratory studies, ordering and review of radiographic studies, pulse oximetry and re-evaluation of patient's condition.      Milagros Loll, MD 06/23/19 [phone]

## 2019-06-23 NOTE — Progress Notes (Signed)
Patient ID: Kimberly Orozco, female   DOB: [birth year], 44 y.o.   MRN: [id number] Discussed with CCM team. With glucose over 1000 and bicarb <7 she needs a lot of resuscitation before she can safely undergo anesthesia. Plan OR with Dr. Derrell Lolling in AM. IV ABX.   Violeta Gelinas, MD, MPH, FACS Please use [email or web address] to contact on call provider

## 2019-06-23 NOTE — Progress Notes (Signed)
CRITICAL VALUE ALERT  Critical Value:  K 2.4  Date & Time Notied:  06/23/19 11:05 PM  Provider Notified: Pola Corn

## 2019-06-23 NOTE — ED Notes (Signed)
Ica Daye mother [phone] looking for an update on pt

## 2019-06-23 NOTE — ED Provider Notes (Signed)
[facility] EMERGENCY DEPARTMENT Provider Note   CSN: [id number] Arrival date & time: 06/23/19  1416  LEVEL 5 CAVEAT - ALTERED MENTAL STATUS   History Chief Complaint  Patient presents with  . Hyperglycemia    Kimberly Orozco is a 44 y.o. female.  HPI 44 year old female presents with hyperglycemia and altered mental status.  History is taken from nurses spoke to EMS, somewhat from the patient, and mostly from the mom over the phone.  The patient has been confused for the last couple days.  Per the mom, the patient ran out of the meds given to her by behavioral health and has not taken any meds for 1 month.  Found to have a fever of 100.6 here.  Patient denies cough or shortness of breath.  However her history is very limiting as she is confused.  No known rash per the mom though she is found to have a significant rash as below.  Glucose over 500 for EMS.  History reviewed. No pertinent past medical history.  Patient Active Problem List   Diagnosis Date Noted  . Diabetes mellitus type 2 in obese ([place]) 04/27/2019  . Bipolar disorder ([place]) 04/26/2019  . Schizophrenia ([place]) 04/25/2019    History reviewed. No pertinent surgical history.   OB History   No obstetric history on file.     No family history on file.  Social History   Tobacco Use  . Smoking status: Never Smoker  . Smokeless tobacco: Never Used  Substance Use Topics  . Alcohol use: Not on file  . Drug use: Not on file    Home Medications Prior to Admission medications   Medication Sig Start Date End Date Taking? Authorizing Provider  acetaminophen (TYLENOL) 325 MG tablet Take 650 mg by mouth every 6 (six) hours as needed for mild pain or headache.    [provider]  benztropine (COGENTIN) 1 MG tablet Take 1 tablet (1 mg total) by mouth 2 (two) times daily. 05/02/19   Johnn Hai, MD  cloNIDine (CATAPRES) 0.2 MG tablet Take 1 tablet (0.2 mg total) by mouth 2 (two) times daily.  05/02/19   Johnn Hai, MD  divalproex (DEPAKOTE SPRINKLE) 125 MG capsule Take 2 capsules (250 mg total) by mouth 2 (two) times daily. 05/02/19   Johnn Hai, MD  haloperidol (HALDOL) 10 MG tablet 1 in am 2 at hs 05/02/19   Johnn Hai, MD  haloperidol decanoate (HALDOL DECANOATE) 100 MG/ML injection Inject 1 mL (100 mg total) into the muscle every 30 (thirty) days. Due 3/15 05/29/19   Johnn Hai, MD  insulin aspart (NOVOLOG) 100 UNIT/ML injection Inject 4 Units into the skin 3 (three) times daily with meals. 05/02/19   Johnn Hai, MD  metFORMIN (GLUCOPHAGE-XR) 500 MG 24 hr tablet Take 1 tablet (500 mg total) by mouth daily with supper. 05/02/19   Johnn Hai, MD  propranolol ER (INDERAL LA) 80 MG 24 hr capsule Take 1 capsule (80 mg total) by mouth daily. 05/02/19 05/01/20  Johnn Hai, MD  temazepam (RESTORIL) 30 MG capsule Take 1 capsule (30 mg total) by mouth at bedtime. 05/02/19   Johnn Hai, MD    Allergies    Patient has no known allergies.  Review of Systems   Review of Systems  Unable to perform ROS: Mental status change    Physical Exam Updated Vital Signs BP (!) 128/94   Pulse (!) 144   Temp (!) 100.6 F (38.1 C)   Resp (!) 32  Ht 5\' 7"  (1.702 m)   Wt 90.7 kg   SpO2 99%   BMI 31.32 kg/m   Physical Exam Vitals and nursing note reviewed.  Constitutional:      Appearance: She is well-developed. She is obese. She is ill-appearing.  HENT:     Head: Normocephalic and atraumatic.     Right Ear: External ear normal.     Left Ear: External ear normal.     Nose: Nose normal.     Mouth/Throat:     Mouth: Mucous membranes are dry.  Eyes:     General:        Right eye: No discharge.        Left eye: No discharge.     Pupils: Pupils are equal, round, and reactive to light.  Cardiovascular:     Rate and Rhythm: Regular rhythm. Tachycardia present.     Heart sounds: Normal heart sounds.  Pulmonary:     Effort: Pulmonary effort is normal.     Breath sounds: Normal  breath sounds.  Abdominal:     Palpations: Abdomen is soft.     Tenderness: There is no abdominal tenderness.  Musculoskeletal:     Cervical back: Normal range of motion and neck supple.  Skin:    General: Skin is warm and dry.     Findings: Rash present.     Comments: See picture There is some blistering to the right proximal thigh. No rash to distal or upper extremities, chest, or back. No oral lesions. The mons feels swollen and mildly tender  Neurological:     Mental Status: She is alert. She is disoriented.  Psychiatric:        Mood and Affect: Mood is not anxious.        ED Results / Procedures / Treatments   Labs (all labs ordered are listed, but only abnormal results are displayed) Labs Reviewed  CBG MONITORING, ED - Abnormal; Notable for the following components:      Result Value   Glucose-Capillary >600 (*)    All other components within normal limits  CULTURE, BLOOD (ROUTINE X 2)  CULTURE, BLOOD (ROUTINE X 2)  URINE CULTURE  RESPIRATORY PANEL BY RT PCR (FLU A&B, COVID)  LACTIC ACID, PLASMA  LACTIC ACID, PLASMA  COMPREHENSIVE METABOLIC PANEL  CBC WITH DIFFERENTIAL/PLATELET  APTT  PROTIME-INR  URINALYSIS, ROUTINE W REFLEX MICROSCOPIC  AMMONIA  ETHANOL  OSMOLALITY  BETA-HYDROXYBUTYRIC ACID  VALPROIC ACID LEVEL  I-STAT BETA HCG BLOOD, ED ([facility], [facility], AP ONLY)  I-STAT CHEM 8, ED  POC SARS CORONAVIRUS 2 AG -  ED    EKG EKG Interpretation  Date/Time:  Monday June 23 2019 15:00:51 EDT Ventricular Rate:  147 PR Interval:    QRS Duration: 94 QT Interval:  317 QTC Calculation: 496 R Axis:   67 Text Interpretation: Sinus tachycardia Probable left atrial enlargement Probable left ventricular hypertrophy Abnormal T, consider ischemia, inferior leads No old tracing to compare Confirmed by 01[birth year]-1985 [phone]) on 06/23/2019 3:20:12 PM   Radiology No results found.  Procedures .Critical Care Performed by: 08/23/2019, MD Authorized by: Pricilla Loveless, MD   Critical care provider statement:    Critical care time (minutes):  33   Critical care time was exclusive of:  Separately billable procedures and treating other patients   Critical care was necessary to treat or prevent imminent or life-threatening deterioration of the following conditions:  Sepsis   Critical care was time spent personally by me on  the following activities:  Discussions with consultants, evaluation of patient's response to treatment, examination of patient, ordering and performing treatments and interventions, ordering and review of laboratory studies, ordering and review of radiographic studies, pulse oximetry, re-evaluation of patient's condition, obtaining history from patient or surrogate and review of old charts   (including critical care time)  Medications Ordered in ED Medications  ceFEPIme (MAXIPIME) 2 g in sodium chloride 0.9 % 100 mL IVPB (2 g Intravenous New Bag/Given 06/23/19 1514)  metroNIDAZOLE (FLAGYL) IVPB 500 mg (500 mg Intravenous New Bag/Given 06/23/19 1511)  vancomycin (VANCOREADY) IVPB 2000 mg/400 mL (has no administration in time range)  lactated ringers bolus 1,000 mL (has no administration in time range)  acetaminophen (TYLENOL) suppository 650 mg (has no administration in time range)  lactated ringers bolus 1,000 mL (1,000 mLs Intravenous New Bag/Given 06/23/19 1510)  acetaminophen (TYLENOL) tablet 650 mg (650 mg Oral Given 06/23/19 1518)    ED Course  I have reviewed the triage vital signs and the nursing notes.  Pertinent labs & imaging results that were available during my care of the patient were reviewed by me and considered in my medical decision making (see chart for details).    MDM Rules/Calculators/A&P                      Patient is clearly ill.  Given her fever and altered mental state, was started on broad antibiotics for sepsis.  Code sepsis called and she will be given 2 L IV fluid and will need reevaluation after some of  her lab work has come back.  When undressed we found the significant rash as above.  There is no obvious sloughing of the skin.  No oral lesions.  We will get CT of her abdomen/pelvis and go down through her right leg.  I have updated her mom on the patient's illness and need for admission and current plan. Care transferred to Dr. Stevie Kern. Final Clinical Impression(s) / ED Diagnoses Final diagnoses:  None    Rx / DC Orders ED Discharge Orders    None       Pricilla Loveless, MD 06/23/19 1524

## 2019-06-23 NOTE — ED Triage Notes (Addendum)
Pt brought to ED via EMS from home. Per EMS, pt's mother stated pt has been "sickly" for 2-3 days, sleeping more and less active. Pt has not had metformin in 1 wk. EMS reports CBG 557, HR 148, BP 106/80.Given 400 mL NS en route. Pt currently alert to self and situation, slow to respond, tachycardic 145, febrile 100.6 oral.

## 2019-06-24 ENCOUNTER — Inpatient Hospital Stay (HOSPITAL_COMMUNITY): Payer: Medicare Other | Admitting: Certified Registered Nurse Anesthetist

## 2019-06-24 ENCOUNTER — Inpatient Hospital Stay (HOSPITAL_COMMUNITY): Payer: Medicare Other

## 2019-06-24 ENCOUNTER — Encounter (HOSPITAL_COMMUNITY)
Admission: EM | Disposition: A | Discharge status: Skilled Nursing Facility | Payer: Self-pay | Source: Home / Self Care | Attending: Internal Medicine

## 2019-06-24 DIAGNOSIS — L0390 Cellulitis, unspecified: Secondary | ICD-10-CM

## 2019-06-24 HISTORY — PX: INCISION AND DRAINAGE PERIRECTAL ABSCESS: SHX1804

## 2019-06-24 LAB — CBC
HCT: 40.8 % (ref 36.0–46.0)
Hemoglobin: 13.6 g/dL (ref 12.0–15.0)
MCH: 26.6 pg (ref 26.0–34.0)
MCHC: 33.3 g/dL (ref 30.0–36.0)
MCV: 79.7 fL — ABNORMAL LOW (ref 80.0–100.0)
Platelets: 211 10*3/uL (ref 150–400)
RBC: 5.12 MIL/uL — ABNORMAL HIGH (ref 3.87–5.11)
RDW: 14.7 % (ref 11.5–15.5)
WBC: 20.3 10*3/uL — ABNORMAL HIGH (ref 4.0–10.5)
nRBC: 0.2 % (ref 0.0–0.2)

## 2019-06-24 LAB — POCT I-STAT 7, (LYTES, BLD GAS, ICA,H+H)
Acid-base deficit: 7 mmol/L — ABNORMAL HIGH (ref 0.0–2.0)
Acid-base deficit: 11 mmol/L — ABNORMAL HIGH (ref 0.0–2.0)
Bicarbonate: 17.8 mmol/L — ABNORMAL LOW (ref 20.0–28.0)
Bicarbonate: 13.2 mmol/L — ABNORMAL LOW (ref 20.0–28.0)
Calcium, Ion: 1.39 mmol/L (ref 1.15–1.40)
Calcium, Ion: 1.3 mmol/L (ref 1.15–1.40)
HCT: 34 % — ABNORMAL LOW (ref 36.0–46.0)
HCT: 23 % — ABNORMAL LOW (ref 36.0–46.0)
Hemoglobin: 11.6 g/dL — ABNORMAL LOW (ref 12.0–15.0)
Hemoglobin: 7.8 g/dL — ABNORMAL LOW (ref 12.0–15.0)
O2 Saturation: 97 %
O2 Saturation: 98 %
Patient temperature: 97.5
Patient temperature: 98.6
Potassium: 3 mmol/L — ABNORMAL LOW (ref 3.5–5.1)
Potassium: 3.5 mmol/L (ref 3.5–5.1)
Sodium: 143 mmol/L (ref 135–145)
Sodium: 142 mmol/L (ref 135–145)
TCO2: 19 mmol/L — ABNORMAL LOW (ref 22–32)
TCO2: 14 mmol/L — ABNORMAL LOW (ref 22–32)
pCO2 arterial: 32.6 mmHg (ref 32.0–48.0)
pCO2 arterial: 23.4 mmHg — ABNORMAL LOW (ref 32.0–48.0)
pH, Arterial: 7.342 — ABNORMAL LOW (ref 7.350–7.450)
pH, Arterial: 7.358 (ref 7.350–7.450)
pO2, Arterial: 97 mmHg (ref 83.0–108.0)
pO2, Arterial: 113 mmHg — ABNORMAL HIGH (ref 83.0–108.0)

## 2019-06-24 LAB — BASIC METABOLIC PANEL
Anion gap: 9 (ref 5–15)
Anion gap: 13 (ref 5–15)
Anion gap: 10 (ref 5–15)
Anion gap: 5 (ref 5–15)
BUN: 25 mg/dL — ABNORMAL HIGH (ref 6–20)
BUN: 25 mg/dL — ABNORMAL HIGH (ref 6–20)
BUN: 24 mg/dL — ABNORMAL HIGH (ref 6–20)
BUN: 22 mg/dL — ABNORMAL HIGH (ref 6–20)
CO2: 14 mmol/L — ABNORMAL LOW (ref 22–32)
CO2: 14 mmol/L — ABNORMAL LOW (ref 22–32)
CO2: 14 mmol/L — ABNORMAL LOW (ref 22–32)
CO2: 31 mmol/L (ref 22–32)
Calcium: 9.7 mg/dL (ref 8.9–10.3)
Calcium: 9.8 mg/dL (ref 8.9–10.3)
Calcium: 8.9 mg/dL (ref 8.9–10.3)
Calcium: 6.8 mg/dL — ABNORMAL LOW (ref 8.9–10.3)
Chloride: 120 mmol/L — ABNORMAL HIGH (ref 98–111)
Chloride: 117 mmol/L — ABNORMAL HIGH (ref 98–111)
Chloride: 120 mmol/L — ABNORMAL HIGH (ref 98–111)
Chloride: 109 mmol/L (ref 98–111)
Creatinine, Ser: 0.87 mg/dL (ref 0.44–1.00)
Creatinine, Ser: 0.78 mg/dL (ref 0.44–1.00)
Creatinine, Ser: 0.82 mg/dL (ref 0.44–1.00)
Creatinine, Ser: 0.76 mg/dL (ref 0.44–1.00)
GFR calc Af Amer: >60 mL/min (ref >60)
GFR calc Af Amer: >60 mL/min (ref >60)
GFR calc Af Amer: >60 mL/min (ref >60)
GFR calc Af Amer: >60 mL/min (ref >60)
GFR calc non Af Amer: >60 mL/min (ref >60)
GFR calc non Af Amer: >60 mL/min (ref >60)
GFR calc non Af Amer: >60 mL/min (ref >60)
GFR calc non Af Amer: >60 mL/min (ref >60)
Glucose, Bld: 245 mg/dL — ABNORMAL HIGH (ref 70–99)
Glucose, Bld: 210 mg/dL — ABNORMAL HIGH (ref 70–99)
Glucose, Bld: 194 mg/dL — ABNORMAL HIGH (ref 70–99)
Glucose, Bld: 126 mg/dL — ABNORMAL HIGH (ref 70–99)
Potassium: 2.8 mmol/L — ABNORMAL LOW (ref 3.5–5.1)
Potassium: 2.9 mmol/L — ABNORMAL LOW (ref 3.5–5.1)
Potassium: 3.9 mmol/L (ref 3.5–5.1)
Potassium: 3.4 mmol/L — ABNORMAL LOW (ref 3.5–5.1)
Sodium: 143 mmol/L (ref 135–145)
Sodium: 144 mmol/L (ref 135–145)
Sodium: 144 mmol/L (ref 135–145)
Sodium: 145 mmol/L (ref 135–145)

## 2019-06-24 LAB — GLUCOSE, CAPILLARY
Glucose-Capillary: 140 mg/dL — ABNORMAL HIGH (ref 70–99)
Glucose-Capillary: 152 mg/dL — ABNORMAL HIGH (ref 70–99)
Glucose-Capillary: 157 mg/dL — ABNORMAL HIGH (ref 70–99)
Glucose-Capillary: 165 mg/dL — ABNORMAL HIGH (ref 70–99)
Glucose-Capillary: 178 mg/dL — ABNORMAL HIGH (ref 70–99)
Glucose-Capillary: 179 mg/dL — ABNORMAL HIGH (ref 70–99)
Glucose-Capillary: 182 mg/dL — ABNORMAL HIGH (ref 70–99)
Glucose-Capillary: 187 mg/dL — ABNORMAL HIGH (ref 70–99)
Glucose-Capillary: 189 mg/dL — ABNORMAL HIGH (ref 70–99)
Glucose-Capillary: 193 mg/dL — ABNORMAL HIGH (ref 70–99)
Glucose-Capillary: 199 mg/dL — ABNORMAL HIGH (ref 70–99)
Glucose-Capillary: 214 mg/dL — ABNORMAL HIGH (ref 70–99)
Glucose-Capillary: 214 mg/dL — ABNORMAL HIGH (ref 70–99)
Glucose-Capillary: 220 mg/dL — ABNORMAL HIGH (ref 70–99)
Glucose-Capillary: 221 mg/dL — ABNORMAL HIGH (ref 70–99)
Glucose-Capillary: 221 mg/dL — ABNORMAL HIGH (ref 70–99)
Glucose-Capillary: 223 mg/dL — ABNORMAL HIGH (ref 70–99)
Glucose-Capillary: 224 mg/dL — ABNORMAL HIGH (ref 70–99)
Glucose-Capillary: 226 mg/dL — ABNORMAL HIGH (ref 70–99)
Glucose-Capillary: 241 mg/dL — ABNORMAL HIGH (ref 70–99)

## 2019-06-24 LAB — MAGNESIUM: Magnesium: 2.2 mg/dL (ref 1.7–2.4)

## 2019-06-24 LAB — BETA-HYDROXYBUTYRIC ACID: Beta-Hydroxybutyric Acid: 0.28 mmol/L — ABNORMAL HIGH (ref 0.05–0.27)

## 2019-06-24 LAB — PHOSPHORUS
Phosphorus: <1 mg/dL — CL (ref 2.5–4.6)
Phosphorus: 3.4 mg/dL (ref 2.5–4.6)

## 2019-06-24 SURGERY — IRRIGATION AND DEBRIDEMENT ABSCESS
Anesthesia: General

## 2019-06-24 SURGERY — INCISION AND DRAINAGE, ABSCESS, PERIRECTAL
Anesthesia: General | Site: Groin | Laterality: Right

## 2019-06-24 MED ORDER — PHENYLEPHRINE HCL-NACL 10-0.9 MG/250ML-% IV SOLN
INTRAVENOUS | Status: DC | PRN
  Administered 2019-06-24: 50 ug/min via INTRAVENOUS

## 2019-06-24 MED ORDER — DIPHENHYDRAMINE HCL 50 MG/ML IJ SOLN
INTRAMUSCULAR | Status: DC | PRN
  Administered 2019-06-24: 12.5 mg via INTRAVENOUS

## 2019-06-24 MED ORDER — DEXMEDETOMIDINE HCL IN NACL 200 MCG/50ML IV SOLN
INTRAVENOUS | Status: DC | PRN
  Administered 2019-06-24: .7 ug/kg/h via INTRAVENOUS

## 2019-06-24 MED ORDER — INSULIN DETEMIR 100 UNIT/ML ~~LOC~~ SOLN
20.0000 [IU] | Freq: Two times a day (BID) | SUBCUTANEOUS | Status: DC
  Administered 2019-06-24: 18:00:00 20 [IU] via SUBCUTANEOUS
  Filled 2019-06-24 (×3): qty 0.2

## 2019-06-24 MED ORDER — FENTANYL CITRATE (PF) 100 MCG/2ML IJ SOLN
50.0000 ug | INTRAMUSCULAR | Status: DC | PRN
  Administered 2019-06-24 – 2019-06-26 (×6): 50 ug via INTRAVENOUS
  Administered 2019-06-27: 100 ug via INTRAVENOUS
  Filled 2019-06-24 (×4): qty 2

## 2019-06-24 MED ORDER — MIDAZOLAM HCL 2 MG/2ML IJ SOLN
INTRAMUSCULAR | Status: AC
  Filled 2019-06-24: qty 2

## 2019-06-24 MED ORDER — SODIUM CHLORIDE 0.9 % IR SOLN
Status: DC | PRN
  Administered 2019-06-24: 3000 mL

## 2019-06-24 MED ORDER — SODIUM CHLORIDE 0.9 % IV BOLUS
1000.0000 mL | Freq: Once | INTRAVENOUS | Status: AC
  Administered 2019-06-24: 1000 mL via INTRAVENOUS

## 2019-06-24 MED ORDER — LACTATED RINGERS IV SOLN: INTRAVENOUS | Status: DC | PRN

## 2019-06-24 MED ORDER — VANCOMYCIN HCL IN DEXTROSE 1-5 GM/200ML-% IV SOLN
1000.0000 mg | Freq: Two times a day (BID) | INTRAVENOUS | Status: DC
  Administered 2019-06-24 – 2019-06-25 (×3): 1000 mg via INTRAVENOUS
  Filled 2019-06-24 (×3): qty 200

## 2019-06-24 MED ORDER — VANCOMYCIN HCL IN DEXTROSE 1-5 GM/200ML-% IV SOLN: 1000.0000 mg | Freq: Two times a day (BID) | INTRAVENOUS | Status: DC

## 2019-06-24 MED ORDER — PANTOPRAZOLE SODIUM 40 MG IV SOLR
40.0000 mg | Freq: Every day | INTRAVENOUS | Status: DC
  Administered 2019-06-24 – 2019-06-29 (×6): 40 mg via INTRAVENOUS
  Filled 2019-06-24 (×6): qty 40

## 2019-06-24 MED ORDER — PHENYLEPHRINE CONCENTRATED 100MG/250ML (0.4 MG/ML) INFUSION SIMPLE
0.0000 ug/min | INTRAVENOUS | Status: DC
  Administered 2019-06-24: 150 ug/min via INTRAVENOUS
  Filled 2019-06-24: qty 250

## 2019-06-24 MED ORDER — CHLORHEXIDINE GLUCONATE CLOTH 2 % EX PADS
6.0000 | MEDICATED_PAD | Freq: Every day | CUTANEOUS | Status: DC
  Administered 2019-06-24 – 2019-07-23 (×29): 6 via TOPICAL

## 2019-06-24 MED ORDER — INSULIN ASPART 100 UNIT/ML ~~LOC~~ SOLN
2.0000 [IU] | SUBCUTANEOUS | Status: DC
  Administered 2019-06-24: 2 [IU] via SUBCUTANEOUS
  Administered 2019-06-25 (×5): 6 [IU] via SUBCUTANEOUS
  Administered 2019-06-26: 2 [IU] via SUBCUTANEOUS
  Administered 2019-06-26: 4 [IU] via SUBCUTANEOUS
  Administered 2019-06-26: 6 [IU] via SUBCUTANEOUS
  Administered 2019-06-26: 4 [IU] via SUBCUTANEOUS
  Administered 2019-06-26: 6 [IU] via SUBCUTANEOUS
  Administered 2019-06-26: 2 [IU] via SUBCUTANEOUS
  Administered 2019-06-27 (×3): 4 [IU] via SUBCUTANEOUS
  Administered 2019-06-28: 2 [IU] via SUBCUTANEOUS
  Administered 2019-06-28: 4 [IU] via SUBCUTANEOUS
  Administered 2019-06-28: 2 [IU] via SUBCUTANEOUS
  Administered 2019-06-28: 4 [IU] via SUBCUTANEOUS
  Administered 2019-06-28: 2 [IU] via SUBCUTANEOUS
  Administered 2019-06-29: 6 [IU] via SUBCUTANEOUS
  Administered 2019-06-29: 4 [IU] via SUBCUTANEOUS
  Administered 2019-06-29: 2 [IU] via SUBCUTANEOUS
  Administered 2019-06-29: 6 [IU] via SUBCUTANEOUS
  Administered 2019-06-29: 4 [IU] via SUBCUTANEOUS
  Administered 2019-06-29 – 2019-06-30 (×3): 6 [IU] via SUBCUTANEOUS
  Administered 2019-06-30: 4 [IU] via SUBCUTANEOUS
  Administered 2019-06-30: 2 [IU] via SUBCUTANEOUS

## 2019-06-24 MED ORDER — LACTATED RINGERS IV SOLN: Freq: Once | INTRAVENOUS | Status: AC

## 2019-06-24 MED ORDER — SODIUM CHLORIDE 0.9 % IV SOLN
2.0000 g | Freq: Three times a day (TID) | INTRAVENOUS | Status: DC
  Administered 2019-06-24 – 2019-07-04 (×31): 2 g via INTRAVENOUS
  Filled 2019-06-24 (×30): qty 2

## 2019-06-24 MED ORDER — ROCURONIUM BROMIDE 100 MG/10ML IV SOLN
INTRAVENOUS | Status: DC | PRN
  Administered 2019-06-24: 50 mg via INTRAVENOUS
  Administered 2019-06-24: 40 mg via INTRAVENOUS
  Administered 2019-06-24: 10 mg via INTRAVENOUS

## 2019-06-24 MED ORDER — ACETAMINOPHEN 160 MG/5ML PO SOLN: 650.0000 mg | Freq: Four times a day (QID) | ORAL | Status: DC | PRN

## 2019-06-24 MED ORDER — ALBUMIN HUMAN 5 % IV SOLN: INTRAVENOUS | Status: DC | PRN

## 2019-06-24 MED ORDER — SODIUM PHOSPHATES 45 MMOLE/15ML IV SOLN
30.0000 mmol | Freq: Once | INTRAVENOUS | Status: AC
  Administered 2019-06-24: 30 mmol via INTRAVENOUS
  Filled 2019-06-24: qty 10

## 2019-06-24 MED ORDER — CLINDAMYCIN PHOSPHATE 900 MG/50ML IV SOLN
900.0000 mg | Freq: Three times a day (TID) | INTRAVENOUS | Status: DC
  Administered 2019-06-24 – 2019-06-29 (×14): 900 mg via INTRAVENOUS
  Filled 2019-06-24 (×16): qty 50

## 2019-06-24 MED ORDER — DIPHENHYDRAMINE HCL 50 MG/ML IJ SOLN
INTRAMUSCULAR | Status: AC
  Filled 2019-06-24: qty 1

## 2019-06-24 MED ORDER — INSULIN DETEMIR 100 UNIT/ML ~~LOC~~ SOLN
20.0000 [IU] | Freq: Two times a day (BID) | SUBCUTANEOUS | Status: DC
  Filled 2019-06-24: qty 0.2

## 2019-06-24 MED ORDER — POLYETHYLENE GLYCOL 3350 17 G PO PACK
17.0000 g | PACK | Freq: Every day | ORAL | Status: DC
  Administered 2019-06-25: 17 g via ORAL
  Filled 2019-06-24: qty 1

## 2019-06-24 MED ORDER — PHENYLEPHRINE 40 MCG/ML (10ML) SYRINGE FOR IV PUSH (FOR BLOOD PRESSURE SUPPORT)
PREFILLED_SYRINGE | INTRAVENOUS | Status: DC | PRN
  Administered 2019-06-24 (×2): 120 ug via INTRAVENOUS
  Administered 2019-06-24 (×2): 80 ug via INTRAVENOUS
  Administered 2019-06-24: 120 ug via INTRAVENOUS
  Administered 2019-06-24: 80 ug via INTRAVENOUS
  Administered 2019-06-24: 120 ug via INTRAVENOUS

## 2019-06-24 MED ORDER — PROPOFOL 1000 MG/100ML IV EMUL: 0.0000 ug/kg/min | INTRAVENOUS | Status: DC

## 2019-06-24 MED ORDER — FENTANYL CITRATE (PF) 100 MCG/2ML IJ SOLN
50.0000 ug | INTRAMUSCULAR | Status: AC | PRN
  Administered 2019-06-24 (×3): 50 ug via INTRAVENOUS
  Filled 2019-06-24 (×2): qty 2

## 2019-06-24 MED ORDER — GERHARDT'S BUTT CREAM
TOPICAL_CREAM | Freq: Two times a day (BID) | CUTANEOUS | Status: DC
  Administered 2019-06-25 – 2019-06-30 (×4): 1 via TOPICAL
  Filled 2019-06-24 (×4): qty 1

## 2019-06-24 MED ORDER — DOCUSATE SODIUM 50 MG/5ML PO LIQD
100.0000 mg | Freq: Two times a day (BID) | ORAL | Status: DC
  Administered 2019-06-25 (×2): 100 mg via ORAL
  Filled 2019-06-24 (×2): qty 10

## 2019-06-24 MED ORDER — SUCCINYLCHOLINE CHLORIDE 200 MG/10ML IV SOSY
PREFILLED_SYRINGE | INTRAVENOUS | Status: DC | PRN
  Administered 2019-06-24: 120 mg via INTRAVENOUS

## 2019-06-24 MED ORDER — POLYETHYLENE GLYCOL 3350 17 G PO PACK: 17.0000 g | PACK | Freq: Every day | ORAL | Status: DC | PRN

## 2019-06-24 MED ORDER — LACTATED RINGERS IV BOLUS: 500.0000 mL | Freq: Once | INTRAVENOUS | Status: DC

## 2019-06-24 MED ORDER — CHLORHEXIDINE GLUCONATE 0.12% ORAL RINSE (MEDLINE KIT)
15.0000 mL | Freq: Two times a day (BID) | OROMUCOSAL | Status: DC
  Administered 2019-06-25 – 2019-06-27 (×5): 15 mL via OROMUCOSAL

## 2019-06-24 MED ORDER — DEXMEDETOMIDINE HCL IN NACL 400 MCG/100ML IV SOLN
0.4000 ug/kg/h | INTRAVENOUS | Status: DC
  Administered 2019-06-24: 20:00:00 0.9 ug/kg/h via INTRAVENOUS
  Administered 2019-06-24 (×2): 0.7 ug/kg/h via INTRAVENOUS
  Administered 2019-06-25: 1 ug/kg/h via INTRAVENOUS
  Administered 2019-06-25: 0.597 ug/kg/h via INTRAVENOUS
  Administered 2019-06-25 (×3): 1 ug/kg/h via INTRAVENOUS
  Administered 2019-06-25: 0.6 ug/kg/h via INTRAVENOUS
  Administered 2019-06-26 (×2): 0.8 ug/kg/h via INTRAVENOUS
  Administered 2019-06-26 (×3): 1 ug/kg/h via INTRAVENOUS
  Administered 2019-06-27 (×3): 0.8 ug/kg/h via INTRAVENOUS
  Filled 2019-06-24 (×16): qty 100

## 2019-06-24 MED ORDER — LIDOCAINE 2% (20 MG/ML) 5 ML SYRINGE
INTRAMUSCULAR | Status: DC | PRN
  Administered 2019-06-24: 60 mg via INTRAVENOUS

## 2019-06-24 MED ORDER — FENTANYL CITRATE (PF) 250 MCG/5ML IJ SOLN
INTRAMUSCULAR | Status: AC
  Filled 2019-06-24: qty 5

## 2019-06-24 MED ORDER — STERILE WATER FOR INJECTION IV SOLN
INTRAVENOUS | Status: DC
  Filled 2019-06-24 (×2): qty 850

## 2019-06-24 MED ORDER — PROPOFOL 10 MG/ML IV BOLUS
INTRAVENOUS | Status: DC | PRN
  Administered 2019-06-24: 150 mg via INTRAVENOUS

## 2019-06-24 MED ORDER — ORAL CARE MOUTH RINSE
15.0000 mL | OROMUCOSAL | Status: DC
  Administered 2019-06-25 – 2019-06-27 (×24): 15 mL via OROMUCOSAL

## 2019-06-24 MED ORDER — PHENYLEPHRINE HCL-NACL 10-0.9 MG/250ML-% IV SOLN
0.0000 ug/min | INTRAVENOUS | Status: DC
  Administered 2019-06-24: 110 ug/min via INTRAVENOUS
  Administered 2019-06-24: 80 ug/min via INTRAVENOUS
  Administered 2019-06-24: 20 ug/min via INTRAVENOUS
  Administered 2019-06-24: 150 ug/min via INTRAVENOUS
  Filled 2019-06-24: qty 250
  Filled 2019-06-24: qty 500

## 2019-06-24 MED ORDER — 0.9 % SODIUM CHLORIDE (POUR BTL) OPTIME
TOPICAL | Status: DC | PRN
  Administered 2019-06-24: 1000 mL

## 2019-06-24 MED ORDER — POTASSIUM CHLORIDE 10 MEQ/100ML IV SOLN
10.0000 meq | INTRAVENOUS | Status: AC
  Administered 2019-06-24 (×8): 10 meq via INTRAVENOUS
  Filled 2019-06-24 (×8): qty 100

## 2019-06-24 MED ORDER — FENTANYL CITRATE (PF) 100 MCG/2ML IJ SOLN
INTRAMUSCULAR | Status: DC | PRN
  Administered 2019-06-24: 50 ug via INTRAVENOUS
  Administered 2019-06-24: 100 ug via INTRAVENOUS
  Administered 2019-06-24: 50 ug via INTRAVENOUS

## 2019-06-24 MED ORDER — SODIUM CHLORIDE 0.9 % IV SOLN
2.0000 g | INTRAVENOUS | Status: DC
  Filled 2019-06-24: qty 2

## 2019-06-24 SURGICAL SUPPLY — 33 items
BNDG GAUZE ELAST 4 BULKY (GAUZE/BANDAGES/DRESSINGS) ×8 IMPLANT
CANISTER SUCT 3000ML PPV (MISCELLANEOUS) ×3 IMPLANT
COVER SURGICAL LIGHT HANDLE (MISCELLANEOUS) ×3 IMPLANT
COVER WAND RF STERILE (DRAPES) ×3 IMPLANT
DRAPE UTILITY XL STRL (DRAPES) ×3 IMPLANT
DRSG PAD ABDOMINAL 8X10 ST (GAUZE/BANDAGES/DRESSINGS) ×12 IMPLANT
ELECT REM PT RETURN 9FT ADLT (ELECTROSURGICAL) ×9
ELECTRODE REM PT RTRN 9FT ADLT (ELECTROSURGICAL) ×1 IMPLANT
GLOVE BIO SURGEON STRL SZ7.5 (GLOVE) ×3 IMPLANT
GLOVE BIOGEL PI IND STRL 8 (GLOVE) ×1 IMPLANT
GLOVE BIOGEL PI INDICATOR 8 (GLOVE) ×2
GOWN STRL REUS W/ TWL LRG LVL3 (GOWN DISPOSABLE) ×1 IMPLANT
GOWN STRL REUS W/ TWL XL LVL3 (GOWN DISPOSABLE) ×1 IMPLANT
GOWN STRL REUS W/TWL LRG LVL3 (GOWN DISPOSABLE) ×12
GOWN STRL REUS W/TWL XL LVL3 (GOWN DISPOSABLE) ×3
HOVERMATT SINGLE USE (MISCELLANEOUS) ×2 IMPLANT
KIT BASIN OR (CUSTOM PROCEDURE TRAY) ×3 IMPLANT
KIT TURNOVER KIT B (KITS) ×3 IMPLANT
NS IRRIG 1000ML POUR BTL (IV SOLUTION) ×3 IMPLANT
PACK LITHOTOMY IV (CUSTOM PROCEDURE TRAY) ×3 IMPLANT
PAD ARMBOARD 7.5X6 YLW CONV (MISCELLANEOUS) ×3 IMPLANT
PENCIL SMOKE EVACUATOR (MISCELLANEOUS) ×5 IMPLANT
SPONGE LAP 18X18 RF (DISPOSABLE) ×11 IMPLANT
SUT SILK 0 FSL (SUTURE) ×2 IMPLANT
SUT VIC AB 2-0 SH 18 (SUTURE) ×2 IMPLANT
SWAB COLLECTION DEVICE MRSA (MISCELLANEOUS) ×2 IMPLANT
SWAB CULTURE ESWAB REG 1ML (MISCELLANEOUS) ×2 IMPLANT
TOWEL GREEN STERILE (TOWEL DISPOSABLE) ×5 IMPLANT
TOWEL GREEN STERILE FF (TOWEL DISPOSABLE) ×3 IMPLANT
TUBE CONNECTING 12'X1/4 (SUCTIONS) ×1
TUBE CONNECTING 12X1/4 (SUCTIONS) ×2 IMPLANT
UNDERPAD 30X30 (UNDERPADS AND DIAPERS) ×7 IMPLANT
YANKAUER SUCT BULB TIP NO VENT (SUCTIONS) ×5 IMPLANT

## 2019-06-24 NOTE — Op Note (Signed)
06/24/2019  1:24 PM  PATIENT:  Kimberly Orozco  44 y.o. female  PRE-OPERATIVE DIAGNOSIS:  CELLULITIS  POST-OPERATIVE DIAGNOSIS: Extensive necrotic soft tissue infection  PROCEDURE:  Procedure(s): IRRIGATION AND DEBRIDEMENT GROIN (Right), left inguinal crease, right medial thigh  SURGEON:  Surgeon(s) and Role:    Axel Filler, MD - Primary  PHYSICIAN ASSISTANT: Barnetta Chapel, PA-C-was essential in helping with debridement, retraction, blood loss control, and dressing.  ANESTHESIA:   general  EBL:  400 mL   BLOOD ADMINISTERED:none  DRAINS: Betadine soaked Kerlix within the wound  LOCAL MEDICATIONS USED:  NONE  SPECIMEN:  Source of Specimen: Right inguinal abscess, aerobic anaerobic  DISPOSITION OF SPECIMEN: Microbiology  COUNTS:  YES  TOURNIQUET:  * No tourniquets in log *  DICTATION: .Dragon Dictation Indication procedure: Patient is a 44 year old female came in in DKA, with a right inguinal necrotic soft tissue infection.  Patient was resuscitated overnight and was taken to the operating room urgently for debridement and irrigation of soft tissue infection.  Findings: Patient had a large, 45 x 30 cm necrotic soft tissue infection of the right medial thigh, inferior right abdominal wall, right labia, mons pubis extending to the left side.  This was superficially debrided, consisting of adipose tissue.  The fascia appeared to be viable as was the muscle underneath.  Details of procedure: After the patient was consented she taken back to the OR and placed in supine position with bilateral SCDs in place.  She underwent general endotracheal intubation.  Patient was placed in the lithotomy position.  Patient is an prepped draped sterile fashion.  A timeout was called all facts verified.  At this time the area of the previous necrotic appearing blisters of the right thigh and the extension of the previous erythema was incised using #10 blade.  This was incised down to the  fat layer.  Cautery was used to maintain your states and dissection was taken down to the fascia.  Significant amount of the right medial thigh adipose tissue was necrotic.  This extended down inferiorly to the mid thigh area.  This was discarded.  This tracked medially to the right labia.  This adipose tissue was debrided.  This tracked up to the right lower abdominal wall.  This appeared to be superficial to the fascia.  This was also debrided.  This did track somewhat laterally to the right mid axillary line of the hip area.  The area of the mons pubis was further debrided to the left side.  There was a tract that was opened up however the fat appeared to be well perfused but not necrotic.  This area was left in place.  Overall the extension of the debrided tissue was 45 x 30 cm.  Again the fascia appeared to be without infection.  The muscle appeared viable and without infection, and healthy.  The area was then irrigated out with a pulse lavage x3 L.  Betadine soaked Kerlix was then placed into the wound.  The wound was dressed with ABD pads and mesh panties.  Patient remained intubated and was taken to the ICU in critical condition.  Excisional debridement: Tool used for debridement-scalpel, cautery  Frequency of surgical debridement:  Initial debridement  Measurement of total devitalized tissue (wound surface) before and after surgical debridement.   45 x 30 cm  Area and depth of devitalized tissue removed from wound.  45 x 30 x 4 cm  Blood loss and description of tissue removed.  40 cc  Evidence of the progress of the wound's response to treatment.  A.  Current wound volume (current dimensions and depth).  45 x 30 x 4 cm  B.  Presence (and extent of) of infection.  Necrotic soft tissue skin infection  C.  Presence (and extent of) of non viable tissue.  As above  D.  Other material in the wound that is expected to inhibit healing.  None  Was there any viable tissue removed  (measurements): No     PLAN OF CARE: Admit to inpatient   PATIENT DISPOSITION:  ICU - intubated and critically ill.   Delay start of Pharmacological VTE agent (>24hrs) due to surgical blood loss or risk of bleeding: yes

## 2019-06-24 NOTE — Anesthesia Procedure Notes (Signed)
Procedure Name: Intubation Date/Time: 06/24/2019 12:03 PM Performed by: Marny Lowenstein, CRNA Pre-anesthesia Checklist: Patient identified, Emergency Drugs available, Suction available and Patient being monitored Patient Re-evaluated:Patient Re-evaluated prior to induction Oxygen Delivery Method: Circle System Utilized Preoxygenation: Pre-oxygenation with 100% oxygen Induction Type: IV induction Ventilation: Mask ventilation without difficulty Laryngoscope Size: Glidescope and 3 Grade View: Grade I Tube type: Oral Number of attempts: 1 Airway Equipment and Method: Stylet and Oral airway Placement Confirmation: ETT inserted through vocal cords under direct vision,  positive ETCO2 and breath sounds checked- equal and bilateral Secured at: 22 cm Tube secured with: Tape Difficulty Due To: Difficulty was anticipated Comments: Very poor dentition. Right lower canine very loose, gently removed prior to glidescope placement.

## 2019-06-24 NOTE — Progress Notes (Signed)
eLink Physician-Brief Progress Note Patient Name: Kimberly Orozco DOB: 11/01/1975 MRN: [id number]   Date of Service  06/24/2019  HPI/Events of Note  Hypotension - BP = 61/37. Patient on ceiling dose of peripheral Phenylephrine IV infusion.   eICU Interventions  Will order: 1. Bolus with 0.9 NaCl 1 liter IV over 1 hour now.  2. ABG STAT. 3. Will ask ground team to evaluate at bedside. Patient may require CVL placement.     Intervention Category Major Interventions: Hypotension - evaluation and management  Jamarkis Branam [place] 06/24/2019, 10:07 PM

## 2019-06-24 NOTE — Progress Notes (Signed)
Per [facility], pt able to transfer to subq insulin.  Spoke w/ CCM MD who states leave on insulin gtt until after surgery.

## 2019-06-24 NOTE — Progress Notes (Signed)
Late entry: Patient arrived to Short Stay with 54M RN and moved over to Short Stay monitors. Patient alert and oriented to person only. Consent was by telephone. Sinus tachycardia on monitor, RA, and drips reviewed and verified with sending RN prior to transfer of care. Patient with bilateral safety mitts on hands. Able to get 2 fingers under each mitt.

## 2019-06-24 NOTE — Progress Notes (Signed)
Pharmacy Antibiotic Note  Kimberly Orozco is a 44 y.o. female admitted on 06/23/2019 with sepsis.  Pharmacy has been consulted for Vancomycin and Cefepime dosing. Pt febrile with Tm 101.6. WBC elevated at 20.3, lactate 3.4. AKI improving with Scr 1.91>>0.78. Patient with severe cellulitis of R leg tracking up to abdomen w/plan for debridement. Paint also on clindamycin for possible necrotizing fascitis.   Plan: - Adjusted Vancomycin to 1000 mg IV Q12H (est AUC 479, Scr used 0.8, Vd 0.5) to start now - Adjusted Cefepime to 2g IV Q8H  - Monitor renal function, cultures/sensitivities, clinical progression - Check vancomycin levels as indicated   Height: 5\' 7"  (170.2 cm) Weight: 108.6 kg (239 lb 6.7 oz) IBW/kg (Calculated) : 61.6  Temp (24hrs), Avg:99.6 F (37.6 C), Min:98.1 F (36.7 C), Max:101.6 F (38.7 C)  Recent Labs  Lab 06/23/19 1500 06/23/19 1500 06/23/19 1700 06/23/19 1822 06/23/19 2018 06/23/19 2234 06/24/19 0135 06/24/19 0527  WBC 27.3*  --   --   --  23.9*  --   --  20.3*  CREATININE 1.91*   < > 1.71*  --  1.23* 0.98 0.87 0.78  LATICACIDVEN 4.1*  --   --  3.9*  --  3.4*  --   --    < > = values in this interval not displayed.    Estimated Creatinine Clearance: 113.9 mL/min (by C-G formula based on SCr of 0.78 mg/dL).    No Known Allergies  Antimicrobials this admission: Vanc 4/12 >> Cefepime 4/12>> Clindamycin 4/12>> Flagyl 4/12 x1  Dose adjustments this admission: N/A  Microbiology results: 4/12 UCx - pending 4/12 MRSA PCR - positive 4/12 Staph Aur - positive 4/12 Covid - negative 4/12 Influenza - negative  6/12, PharmD PGY1 Ambulatory Care Resident [employer] # [phone]  Please check [email or web address] for unit-specific pharmacy phone numbers.

## 2019-06-24 NOTE — Progress Notes (Signed)
[facility] Progress Note  Day of Surgery  Subjective: Patient with worsening NSTI of groin and abdominal wall. Called mother and updated on plan to take to OR today. Discussed possibility of need for multiple debridements. Discussed that patient will have an open wound after debridement. Questions encouraged and answered.   Review of Systems  Unable to perform ROS: Mental status change    Objective: Vital signs in last 24 hours: Temp:  [98.1 F (36.7 C)-101.6 F (38.7 C)] 99 F (37.2 C) (04/13 0600) Pulse Rate:  [105-147] 106 (04/13 0600) Resp:  [18-46] 35 (04/13 0600) BP: (82-154)/(40-100) 100/72 (04/13 0614) SpO2:  [97 %-100 %] 99 % (04/13 0600) Weight:  [90.7 kg-108.6 kg] 108.6 kg (04/13 0306) Last BM Date: (PTA)  Intake/Output from previous day: 04/12 0701 - 04/13 0700 In: 3470.9 [I.V.:529.1; IV Piggyback:2941.8] Out: 1755 [Urine:1755] Intake/Output this shift: No intake/output data recorded.  PE: General: lying in bed, appears uncomfortable  HEENT: mouth is dry Heart: tachycardia  Abd: some erythema of skin, soft, ND GU: worsening erythema of mons and R groin with some necrosis of overlying skin    Lab Results:  Recent Labs    06/23/19 2018 06/24/19 0527  WBC 23.9* 20.3*  HGB 12.9 13.6  HCT 40.0 40.8  PLT 269 211   BMET Recent Labs    06/24/19 0135 06/24/19 0527  NA 143 144  K 2.8* 2.9*  CL 120* 117*  CO2 14* 14*  GLUCOSE 245* 210*  BUN 25* 25*  CREATININE 0.87 0.78  CALCIUM 9.7 9.8   PT/INR Recent Labs    06/23/19 1500  LABPROT 18.5*  INR 1.6*   CMP     Component Value Date/Time   NA 144 06/24/2019 0527   K 2.9 (L) 06/24/2019 0527   CL 117 (H) 06/24/2019 0527   CO2 14 (L) 06/24/2019 0527   GLUCOSE 210 (H) 06/24/2019 0527   BUN 25 (H) 06/24/2019 0527   CREATININE 0.78 06/24/2019 0527   CALCIUM 9.8 06/24/2019 0527   PROT 7.2 06/23/2019 1500   ALBUMIN 1.6 (L) 06/23/2019 1500   AST 27 06/23/2019 1500   ALT 26  06/23/2019 1500   ALKPHOS 149 (H) 06/23/2019 1500   BILITOT 1.2 06/23/2019 1500   GFRNONAA >60 06/24/2019 0527   GFRAA >60 06/24/2019 0527   Lipase  No results found for: LIPASE     Studies/Results: CT Head Wo Contrast  Result Date: 06/23/2019 CLINICAL DATA:  Encephalopathy EXAM: CT HEAD WITHOUT CONTRAST TECHNIQUE: Contiguous axial images were obtained from the base of the skull through the vertex without intravenous contrast. COMPARISON:  None. FINDINGS: Brain: No evidence of acute infarction, hemorrhage, hydrocephalus, extra-axial collection or mass lesion/mass effect. Vascular: Atherosclerotic calcification of the carotid siphons. No hyperdense vessel. Skull: No calvarial fracture or suspicious osseous lesion. No scalp swelling or hematoma. Sinuses/Orbits: Minimal thickening in the left maxillary sinus, possibly a Don the genicular origin given periapical lucency of the bilateral maxillary molars. Remaining paranasal sinuses and mastoid air cells are predominantly clear. Debris within the external auditory canals may reflect cerumen impaction. Included orbital structures are unremarkable. Other: None IMPRESSION: 1. No acute intracranial findings. 2. Debris within the external auditory canals may reflect cerumen impaction. 3. Minimal left maxillary sinus disease, possibly odontogenic origin given periapical lucency of the bilateral maxillary molars. Correlate with dental examination. Electronically Signed   By: Kreg Shropshire M.D.   On: 06/23/2019 16:48   CT ABDOMEN PELVIS W CONTRAST  Result Date: 06/23/2019 CLINICAL  DATA:  Erysipelas, diabetes, obesity, fever EXAM: CT ABDOMEN AND PELVIS WITH CONTRAST TECHNIQUE: Multidetector CT imaging of the abdomen and pelvis was performed using the standard protocol following bolus administration of intravenous contrast. CONTRAST:  3mL OMNIPAQUE IOHEXOL 300 MG/ML  SOLN COMPARISON:  None. FINDINGS: Lower chest: No acute pleural or parenchymal lung disease.  Hepatobiliary: Calcified gallstone is identified without evidence of cholecystitis. Liver enhances normally. No biliary dilation. Pancreas: Unremarkable. No pancreatic ductal dilatation or surrounding inflammatory changes. Evaluation limited by respiratory motion. Spleen: Normal in size without focal abnormality. Adrenals/Urinary Tract: Adrenal glands are unremarkable. Kidneys are normal, without renal calculi, focal lesion, or hydronephrosis. Bladder is unremarkable. Evaluation limited by respiratory motion. Stomach/Bowel: No bowel obstruction or ileus. Normal appendix right lower quadrant. No bowel wall thickening or inflammatory changes. Evaluation limited by respiratory motion. Vascular/Lymphatic: Borderline enlarged lymph nodes are seen within the bilateral inguinal regions, right greater than left, compatible with reactive adenopathy. Largest lymph node measures 13 mm in short axis in the right inguinal region image 87. Vascular structures enhance normally. Reproductive: The uterus is enlarged and heterogeneous consistent with multiple fibroids. Largest fibroid within the uterine fundus measures up to 8.4 cm. There are no adnexal masses. Other: There is extensive dermal thickening and subcutaneous fat stranding within the lower anterior abdominal wall, bilateral inguinal regions, and extending along the right-sided labia. Findings are consistent with infection given clinical history. No fluid collection or abscess. There is no free fluid or free gas within the abdomen or pelvis. No abdominal wall hernia. Musculoskeletal: No acute or destructive bony lesions. Reconstructed images demonstrate no additional findings. IMPRESSION: 1. Extensive dermal thickening and subcutaneous fat stranding within the lower anterior abdominal wall, bilateral inguinal regions, and right-side perineum consistent with infection. No fluid collection or abscess. 2. Fibroid uterus. 3. Cholelithiasis without cholecystitis. Electronically  Signed   By: Randa Ngo M.D.   On: 06/23/2019 16:52   CT FEMUR RIGHT W CONTRAST  Result Date: 06/23/2019 CLINICAL DATA:  Erysipelas, fever EXAM: CT OF THE LOWER RIGHT EXTREMITY WITH CONTRAST TECHNIQUE: Multidetector CT imaging of the lower right extremity was performed according to the standard protocol following intravenous contrast administration. COMPARISON:  None. CONTRAST:  21mL OMNIPAQUE IOHEXOL 300 MG/ML  SOLN FINDINGS: Bones/Joint/Cartilage No acute or destructive bony lesions. Moderate 3 compartmental right knee osteoarthritis greatest in the patellofemoral compartment. There is moderate to severe right hip osteoarthritis with severe joint space narrowing and osteophyte formation. Ligaments Suboptimally assessed by CT. Muscles and Tendons Mild diffuse muscular atrophy.  No focal abnormalities. Soft tissues There is extensive subcutaneous fat stranding and dermal thickening extending along the lower anterior abdominal wall, right inguinal region, right labia, and proximal anterior thigh. Findings are compatible with infection given clinical history. There is no underlying fluid collection or abscess. Small reactive lymph nodes are seen within the right inguinal region, largest measuring 13 mm in short axis. Reconstructed images demonstrate no additional findings. IMPRESSION: 1. Dermal thickening and subcutaneous fat stranding extending from the lower anterior abdominal wall through the right inguinal region and perineum, and involving the anterior proximal right thigh. Findings are consistent with infection. No fluid collection or abscess. 2. Significant osteoarthritis of the right hip and knee. 3. Reactive lymph nodes right inguinal region. Electronically Signed   By: Randa Ngo M.D.   On: 06/23/2019 16:54   DG Chest Port 1 View  Result Date: 06/23/2019 CLINICAL DATA:  Fever, altered mental status EXAM: PORTABLE CHEST 1 VIEW COMPARISON:  None. FINDINGS: Marked elevation  of the right  hemidiaphragm, a nonspecific finding. Adjacent atelectatic changes are noted. No consolidative opacity, pneumothorax or effusion. No convincing features of edema. Cardiomediastinal contours are shifted rightward though this is likely positional given the appearance of rotation on this exam. No acute osseous or soft tissue abnormality. IMPRESSION: 1. Marked elevation of the right hemidiaphragm, a nonspecific finding. Adjacent atelectatic changes. No consolidative opacity or effusion. 2. Apparent rightward mediastinal shift, likely positional. Electronically Signed   By: Kreg Shropshire M.D.   On: 06/23/2019 15:42    Anti-infectives: Anti-infectives (From admission, onward)   Start     Dose/Rate Route Frequency Ordered Stop   06/24/19 1600  vancomycin (VANCOREADY) IVPB 750 mg/150 mL  Status:  Discontinued     750 mg 150 mL/hr over 60 Minutes Intravenous Every 12 hours 06/23/19 1700 06/24/19 0714   06/24/19 1600  vancomycin (VANCOCIN) IVPB 1000 mg/200 mL premix  Status:  Discontinued     1,000 mg 200 mL/hr over 60 Minutes Intravenous Every 12 hours 06/24/19 0714 06/24/19 0750   06/24/19 1400  clindamycin (CLEOCIN) IVPB 900 mg     900 mg 100 mL/hr over 30 Minutes Intravenous Every 8 hours 06/24/19 0756     06/24/19 1030  ceFEPIme (MAXIPIME) 2 g in sodium chloride 0.9 % 100 mL IVPB     2 g 200 mL/hr over 30 Minutes Intravenous Every 8 hours 06/24/19 0714     06/24/19 0800  vancomycin (VANCOCIN) IVPB 1000 mg/200 mL premix     1,000 mg 200 mL/hr over 60 Minutes Intravenous Every 12 hours 06/24/19 0750     06/24/19 0300  ceFEPIme (MAXIPIME) 2 g in sodium chloride 0.9 % 100 mL IVPB  Status:  Discontinued     2 g 200 mL/hr over 30 Minutes Intravenous Every 12 hours 06/23/19 1700 06/24/19 0714   06/23/19 1700  clindamycin (CLEOCIN) IVPB 600 mg  Status:  Discontinued     600 mg 100 mL/hr over 30 Minutes Intravenous Every 8 hours 06/23/19 1649 06/24/19 0756   06/23/19 1500  vancomycin (VANCOREADY) IVPB  2000 mg/400 mL     2,000 mg 200 mL/hr over 120 Minutes Intravenous  Once 06/23/19 1454 06/23/19 1747   06/23/19 1445  ceFEPIme (MAXIPIME) 2 g in sodium chloride 0.9 % 100 mL IVPB     2 g 200 mL/hr over 30 Minutes Intravenous  Once 06/23/19 1434 06/23/19 1557   06/23/19 1445  metroNIDAZOLE (FLAGYL) IVPB 500 mg     500 mg 100 mL/hr over 60 Minutes Intravenous  Once 06/23/19 1434 06/23/19 1608   06/23/19 1445  vancomycin (VANCOCIN) IVPB 1000 mg/200 mL premix  Status:  Discontinued     1,000 mg 200 mL/hr over 60 Minutes Intravenous  Once 06/23/19 1434 06/23/19 1440   06/23/19 1445  vancomycin (VANCOCIN) 2,000 mg in sodium chloride 0.9 % 500 mL IVPB  Status:  Discontinued     2,000 mg 250 mL/hr over 120 Minutes Intravenous  Once 06/23/19 1440 06/23/19 1454       Assessment/Plan T2DM with DKA - CBGs in the 200s this AM, ready to be off insulin gtt per CCM Acute renal failure - Cr improving Hypokalemia/Hypophosphatemia - K 2.9 this AM, replacement per CCM; Phos < 1.0, replacement per CCM Schizophrenia Bipolar disorder  NSTI of R abd wall and groin - WBC 20 from 23, Tmax 101.6 - erythema spreading and there is some skin breakdown overlying  - to OR today for I&D - called and discussed with patient's  mother and answered questions regarding surgery  FEN: NPO, IVF VTE: SCDs, SQ heparin  ID: flagyl 4/12; vanc 4/12>>, cefepime 4/12>>, clinda 4/13>>  LOS: 1 day    Juliet Rude , [facility] 06/24/2019, 8:48 AM Please see Amion for pager number during day hours 7:00am-4:30pm

## 2019-06-24 NOTE — Progress Notes (Signed)
20 units levemir given at 1747 for basal rate before d/c insulin gtt. Endotool not aligning with hyperglycemic protocol. Insulin gtt turned off at 1951.   Jacobo Forest, RN

## 2019-06-24 NOTE — Progress Notes (Signed)
Inpatient Diabetes Program Recommendations  AACE/ADA: New Consensus Statement on Inpatient Glycemic Control (2015)  Target Ranges:  Prepandial:   less than 140 mg/dL      Peak postprandial:   less than 180 mg/dL (1-2 hours)      Critically ill patients:  140 - 180 mg/dL   Lab Results  Component Value Date   GLUCAP 182 (H) 06/24/2019   HGBA1C 14.4 (H) 04/27/2019    Review of Glycemic Control Results for Kimberly Orozco, Kimberly Orozco (MRN [id number]) as of 06/24/2019 09:37  Ref. Range 04/27/2019 14:17  Hemoglobin A1C Latest Ref Range: 4.8 - 5.6 % 14.4 (H)    Diabetes history: DM 2 Outpatient Diabetes medications: Metformin 500 mg Daily with supper, Novolog 4 units tid Current orders for Inpatient glycemic control: IV insulin  Inpatient Diabetes Program Recommendations:    Endotool gtt rate 5-7 units/hour in the last 12 hours. Will require a large amount of insulin at time of transition.  Lantus 20 units Novolog 0-15 units tid + hs May also need Novolog 3-4 units tid meal coverage when eating.  Noted A1c 14.4%. on 04/27/19. Tried to place pt on insulin at that time and she refused. Pt did not want education at that time stating "a lot of black people have DM." looks like pt was prescribed Novolog at some point, however not taking meds for the last month. Will try to speak with pt again this admission. Unsure if pt will agree to comply with needed regimen.   Thanks,  Christena Deem RN, MSN, BC-ADM Inpatient Diabetes Coordinator Team Pager [phone] (8a-5p)

## 2019-06-24 NOTE — Progress Notes (Signed)
Per Endo Tool no change in insulin rate at 1100.

## 2019-06-24 NOTE — Progress Notes (Signed)
Reevaluated after return for more Vent and sedation orders written with Precedex and intermittent fentanyl Low-dose Neo-Synephrine started for hypotension  BMET reviewed -anion gap is resolved , will give Levemir 20 units every 12 (Endo tool suggesting 25 every 12) , overlap drip with 2 hours and transition off. Continue D5 half at 75, if sugars remain high, this drip rate can be decreased. Continue bicarbonate drip for NAG acidosis  Additional critical care time x 20m  Glynn Yepes V. Vassie Loll MD

## 2019-06-24 NOTE — Progress Notes (Signed)
NAME:  Kimberly Orozco, MRN:  [id number], DOB:  May 22, 1975, LOS: 1 ADMISSION DATE:  06/23/2019, CONSULTATION DATE:  06/23/2019 REFERRING MD:  Dr. Criss Alvine CHIEF COMPLAINT:  AMS   Brief History   [name] is a 44 y/o female with a PMHx of T2DM, Schizophrenia, Bipolar Disorder who presents to the ED with c/o AMS and found to be in diabetic ketoacidosis complicated by necrotizing soft tissue infection.   History of present illness   [name] is a 44 y/o female with a PMHx of T2DM, Schizophrenia, Bipolar Disorder who presents to the ED with c/o AMS.   Per chart review, patient has been experiencing confusion for the past few days. This is in light of missing her diabetic (metformin and insulin) and psychiatric medications for the last 1 month. Ms. Kamath denies any pain at this time. She is unaware of any rash or skin lesions, denies any trauma to her groin or abdomen.   Past Medical History  - T2DM - Schizophrenia  - Bipolar Disorder   Significant Hospital Events   4/12: Admitted to the ICU  Consults:  General Surgery  Procedures:  None   Significant Diagnostic Tests:   CT abdomen/pelvis (4/12):  1. Extensive dermal thickening and subcutaneous fat stranding within the lower anterior abdominal wall, bilateral inguinal regions, and right-side perineum consistent with infection. No fluid collection or abscess. 2. Fibroid uterus. 3. Cholelithiasis without cholecystitis.  CT Femur (4/12):  1. Dermal thickening and subcutaneous fat stranding extending from the lower anterior abdominal wall through the right inguinal region and perineum, and involving the anterior proximal right thigh. Findings are consistent with infection. No fluid collection or abscess. 2. Significant osteoarthritis of the right hip and knee. 3. Reactive lymph nodes right inguinal region.  Micro Data:  Blood cultures (4/12): Pending Urine Cultures (4/12): Pending   Antimicrobials:    Metronidazole x1 (4/12) Cefepime (4/12) >> Vancomycin (4/12) >> Clindamycin (4/12) >>  Interim history/subjective:  Awake and alert this AM. Oriented to self and "Hospital" only. Reports some abdominal pain. No questions.  Objective   Blood pressure 100/72, pulse (!) 106, temperature 99 F (37.2 C), resp. rate (!) 35, height 5\' 7"  (1.702 m), weight 108.6 kg, SpO2 99 %.        Intake/Output Summary (Last 24 hours) at 06/24/2019 0734 Last data filed at 06/24/2019 0600 Gross per 24 hour  Intake 3470.85 ml  Output 1755 ml  Net 1715.85 ml   Filed Weights   06/23/19 1436 06/23/19 1947 06/24/19 0306  Weight: 90.7 kg 108.6 kg 108.6 kg   Physical Exam Vitals and nursing note reviewed.  Constitutional:      Appearance: She is obese. She is ill-appearing.  HENT:     Mouth/Throat:     Lips: Pink.     Mouth: Mucous membranes are moist.  Eyes:     Pupils: Pupils are equal, round, and reactive to light.  Cardiovascular:     Rate and Rhythm: Regular rhythm. Tachycardia present.     Heart sounds: No murmur. No gallop.   Pulmonary:     Effort: Pulmonary effort is normal.     Breath sounds: No wheezing, rhonchi or rales.  Abdominal:     General: Bowel sounds are decreased.     Palpations: Abdomen is soft.     Tenderness: There is no abdominal tenderness. There is no guarding.  Skin:    General: Skin is warm and dry.     Findings: Erythema present.  Comments: Expanding erythema from the groin to lower abdomen and LEs. Erythema has advanced beyond previously marked area. Continued drainage and necrotic tissue.  Neurological:     Mental Status: She is lethargic.     Comments: Alert, oriented to self and hospital. Not oriented to date nor City.  Psychiatric:        Behavior: Behavior is cooperative.    Resolved Hospital Problem list    Acute Renal Failure   Assessment & Plan:   AGMA, NAGMA Diabetic Ketoacidosis: In the setting of not taking medications for ~ 1 month and  Skin and soft tissue infection as below. - S/P 4L, now on D5-1/2NS @ 75cc/hr - Insulin gtt for now - Persistent Acidosis despite closed GAP,  Bicarb @ 75cc/hr  Necrotizing Soft Tissue Infection: In the setting of uncontrolled diabetes. Erythema has advanced beyond previous marking. - General surgery following, plan for OR at 930am - Vancomycin, Cefepime, Clindamycin per pharmacy  Schizophrenia Bipolar: Home meds benztropine, Divalproex, Haldol, Temazepam - Restart home meds as able, starting with IM Haldol q30d  Hypophosphatemia Hypokalemia  - Monitor on insulin gtt - 2.9 this AM, 71mEq IV ordered (over 8 hrs) - Phos <1.0, 27mmol NaPhos ordered (over 6 hrs) - Recheck K and Phos at 2pm  Best practice:  Diet: NPO Pain/Anxiety/Delirium protocol (if indicated): N/A VAP protocol (if indicated): N/A DVT prophylaxis: Heparin GI prophylaxis: N/A Glucose control: Insulin gtt Mobility: Bedrest Code Status: FULL Family Communication: Mother updated 4/12, will need update post op. Disposition: ICU  Labs   CBC: Recent Labs  Lab 06/23/19 1500 06/23/19 1747 06/23/19 2018 06/24/19 0527  WBC 27.3*  --  23.9* 20.3*  NEUTROABS 22.4*  --   --   --   HGB 13.7 12.9 12.9 13.6  HCT 45.7 38.0 40.0 40.8  MCV 89.1  --  82.6 79.7*  PLT 314  --  269 427    Basic Metabolic Panel: Recent Labs  Lab 06/23/19 1700 06/23/19 1700 06/23/19 1747 06/23/19 2018 06/23/19 2234 06/24/19 0135 06/24/19 0527  NA 132*   < > 139 142 144 143 144  K 3.5   < > 2.6* 2.4* 2.9* 2.8* 2.9*  CL 104  --   --  116* 120* 120* 117*  CO2 8*  --   --  10* 12* 14* 14*  GLUCOSE 842*  --   --  446* 316* 245* 210*  BUN 36*  --   --  29* 27* 25* 25*  CREATININE 1.71*  --   --  1.23* 0.98 0.87 0.78  CALCIUM 9.1  --   --  9.5 9.4 9.7 9.8  MG  --   --   --   --   --   --  2.2  PHOS  --   --   --   --   --   --  <1.0*   < > = values in this interval not displayed.   GFR: Estimated Creatinine Clearance: 113.9 mL/min  (by C-G formula based on SCr of 0.78 mg/dL). Recent Labs  Lab 06/23/19 1500 06/23/19 1822 06/23/19 2018 06/23/19 2234 06/24/19 0527  WBC 27.3*  --  23.9*  --  20.3*  LATICACIDVEN 4.1* 3.9*  --  3.4*  --     Liver Function Tests: Recent Labs  Lab 06/23/19 1500  AST 27  ALT 26  ALKPHOS 149*  BILITOT 1.2  PROT 7.2  ALBUMIN 1.6*   No results for input(s): LIPASE, AMYLASE in the last  168 hours. Recent Labs  Lab 06/23/19 1501  AMMONIA 72*    ABG    Component Value Date/Time   PHART 7.220 (L) 06/23/2019 1747   PCO2ART 15.6 (LL) 06/23/2019 1747   PO2ART 94.0 06/23/2019 1747   HCO3 6.4 (L) 06/23/2019 1747   TCO2 7 (L) 06/23/2019 1747   ACIDBASEDEF 19.0 (H) 06/23/2019 1747   O2SAT 96.0 06/23/2019 1747     Coagulation Profile: Recent Labs  Lab 06/23/19 1500  INR 1.6*    Cardiac Enzymes: No results for input(s): CKTOTAL, CKMB, CKMBINDEX, TROPONINI in the last 168 hours.  HbA1C: Hgb A1c MFr Bld  Date/Time Value Ref Range Status  04/27/2019 02:17 PM 14.4 (H) 4.8 - 5.6 % Final    Comment:    (NOTE) Pre diabetes:          5.7%-6.4% Diabetes:              >6.4% Glycemic control for   <7.0% adults with diabetes     CBG: Recent Labs  Lab 06/24/19 0158 06/24/19 0258 06/24/19 0359 06/24/19 0500 06/24/19 0602  GLUCAP 220* 214* 187* 179* 199*    Review of Systems:   Negative, except as noted above.   Past Medical History  She,  has no past medical history on file.   Surgical History   History reviewed. No pertinent surgical history.   Social History   reports that she has never smoked. She has never used smokeless tobacco.   Family History   Her family history is not on file.   Allergies No Known Allergies   Home Medications  Prior to Admission medications   Medication Sig Start Date End Date Taking? Authorizing Provider  acetaminophen (TYLENOL) 325 MG tablet Take 650 mg by mouth every 6 (six) hours as needed for mild pain or headache.     [provider]  benztropine (COGENTIN) 1 MG tablet Take 1 tablet (1 mg total) by mouth 2 (two) times daily. 05/02/19   Malvin Johns, MD  cloNIDine (CATAPRES) 0.2 MG tablet Take 1 tablet (0.2 mg total) by mouth 2 (two) times daily. 05/02/19   Malvin Johns, MD  divalproex (DEPAKOTE SPRINKLE) 125 MG capsule Take 2 capsules (250 mg total) by mouth 2 (two) times daily. 05/02/19   Malvin Johns, MD  haloperidol (HALDOL) 10 MG tablet 1 in am 2 at hs Patient taking differently: Take 10 mg by mouth See admin instructions. 10mg  in in the morning, and 20mg  at bedtime. 05/02/19   , MD  haloperidol decanoate (HALDOL DECANOATE) 100 MG/ML injection Inject 1 mL (100 mg total) into the muscle every 30 (thirty) days. Due 3/15 05/29/19   4/15, MD  insulin aspart (NOVOLOG) 100 UNIT/ML injection Inject 4 Units into the skin 3 (three) times daily with meals. 05/02/19   Malvin Johns, MD  metFORMIN (GLUCOPHAGE-XR) 500 MG 24 hr tablet Take 1 tablet (500 mg total) by mouth daily with supper. 05/02/19   Malvin Johns, MD  propranolol ER (INDERAL LA) 80 MG 24 hr capsule Take 1 capsule (80 mg total) by mouth daily. 05/02/19 05/01/20  05/04/19, MD  temazepam (RESTORIL) 30 MG capsule Take 1 capsule (30 mg total) by mouth at bedtime. 05/02/19   Malvin Johns, MD    05/04/19, DO IM PGY-3 Pager: [phone]

## 2019-06-24 NOTE — Progress Notes (Signed)
[facility] ADULT ICU REPLACEMENT PROTOCOL FOR AM LAB REPLACEMENT ONLY  The patient does apply for the [facility] Adult ICU Electrolyte Replacment Protocol based on the criteria listed below:   1. Is GFR >/= 40 ml/min? Yes.    Patient's GFR today is Greater than 60 2. Is urine output >/= 0.5 ml/kg/hr for the last 6 hours? Yes.   Patient's UOP is 1.2 ml/kg/hr 3. Is BUN < 60 mg/dL? Yes.    Patient's BUN today is 25 4. Abnormal electrolyte(s):K+ 2.8 5. Ordered repletion with: Per protocol PIV K+ 6. If a panic level lab has been reported, has the CCM MD in charge been notified? No..     Genelle Bal 06/24/2019 2:26 AM

## 2019-06-24 NOTE — Anesthesia Preprocedure Evaluation (Signed)
Anesthesia Evaluation  Patient identified by MRN, date of birth, ID band Patient awake    Reviewed: Allergy & Precautions, NPO status , Patient's Chart, lab work & pertinent test results  Airway Mallampati: II  TM Distance: >3 FB Neck ROM: Full    Dental no notable dental hx.    Pulmonary neg pulmonary ROS,    Pulmonary exam normal breath sounds clear to auscultation       Cardiovascular negative cardio ROS Normal cardiovascular exam Rhythm:Regular Rate:Normal     Neuro/Psych Bipolar Disorder Schizophrenia negative neurological ROS  negative psych ROS   GI/Hepatic negative GI ROS, Neg liver ROS,   Endo/Other  diabetes, Type 2  Renal/GU negative Renal ROS  negative genitourinary   Musculoskeletal negative musculoskeletal ROS (+)   Abdominal (+) + obese,   Peds negative pediatric ROS (+)  Hematology negative hematology ROS (+)   Anesthesia Other Findings Necrotizing Fascitis  Reproductive/Obstetrics negative OB ROS                             Anesthesia Physical Anesthesia Plan  ASA: III  Anesthesia Plan: General   Post-op Pain Management:    Induction: Intravenous  PONV Risk Score and Plan: 3 and Ondansetron, Dexamethasone, Midazolam and Treatment may vary due to age or medical condition  Airway Management Planned: Oral ETT  Additional Equipment:   Intra-op Plan:   Post-operative Plan: Extubation in OR  Informed Consent: I have reviewed the patients History and Physical, chart, labs and discussed the procedure including the risks, benefits and alternatives for the proposed anesthesia with the patient or authorized representative who has indicated his/her understanding and acceptance.     Dental advisory given  Plan Discussed with: CRNA  Anesthesia Plan Comments:         Anesthesia Quick Evaluation

## 2019-06-24 NOTE — Progress Notes (Signed)
CRITICAL VALUE ALERT  Critical Value:  PO4 <1  Date & Time Notied:  06/24/19 6:23 AM   Provider Notified: elink  Orders Received/Actions taken: see orders

## 2019-06-24 NOTE — Progress Notes (Signed)
eLink Physician-Brief Progress Note Patient Name: Kimberly Orozco DOB: 09-05-1975 MRN: [id number]   Date of Service  06/24/2019  HPI/Events of Note  Hypophosphatemia - PO4--- < 1.0.   eICU Interventions  Will order: 1. Replace PO4---. 2. Repeat PO4--- level at 2 PM.     Intervention Category Major Interventions: Electrolyte abnormality - evaluation and management  Lenell Antu 06/24/2019, 6:33 AM

## 2019-06-24 NOTE — Consult Note (Signed)
WOC Nurse Consult Note: Reason for Consult: Necrotizing fasciitis to right thigh.  unstageable pressure injury to right distal gluteal fold/upper posterior thigh Gluteal folds with erythema and weeping between thighs, beneath abdominal pannus and mons pubis Wound type:infectious Pressure Injury POA: Yes right upper thigh/gluteal fold Measurement: Right posterior thigh:  2 cm x 1.5 cm scabbed eschar with induration and erythema, warmth  Possibly part of necrotizing sequela. Right lateral thigh:  20 cm x 9 cm erythema with scattered scabbed lesions, induration and increased warmth to the area. Patient is going to the OR this AM for surgical evaluation of this area.  Abdominal pannus, inguinal folds and mons pubis with erythema, weeping and peeling epithelium Wound QJS:IDXFPKG Drainage (amount, consistency, odor) moderate weeping Periwound:erythema and induration Dressing procedure/placement/frequency: Surgical management of right thigh and posterior thigh Systemic antibiotics for cellulitis to perineal area and abdominal pannus.  Gerhardts barrier paste twice daily and PRN soilage as well.  Will not follow at this time.  Please re-consult if needed.  Maple Hudson MSN, RN, FNP-BC CWON Wound, Ostomy, Continence Nurse Pager [phone]

## 2019-06-24 NOTE — Transfer of Care (Signed)
Immediate Anesthesia Transfer of Care Note  Patient: Kimberly Orozco  Procedure(s) Performed: IRRIGATION AND DEBRIDEMENT RIGHT GROIN AND RIGHT BUTTOCK (Right Groin)  Patient Location: ICU  Anesthesia Type:General  Level of Consciousness: sedated and Patient remains intubated per anesthesia plan  Airway & Oxygen Therapy: Patient remains intubated per anesthesia plan and Patient placed on Ventilator (see vital sign flow sheet for setting)  Post-op Assessment: Report given to RN and Post -op Vital signs reviewed and stable  Post vital signs: Reviewed and stable  Last Vitals:  Vitals Value Taken Time  BP 159/78 06/24/19 1409  Temp    Pulse 110 06/24/19 1412  Resp 18 06/24/19 1412  SpO2 100 % 06/24/19 1412  Vitals shown include unvalidated device data.  Last Pain:  Vitals:   06/24/19 0800  TempSrc: Bladder  PainSc:          Complications: No apparent anesthesia complications

## 2019-06-24 NOTE — Progress Notes (Signed)
Report given to Maralyn Sago, RN surgery.

## 2019-06-25 ENCOUNTER — Inpatient Hospital Stay (HOSPITAL_COMMUNITY): Payer: Medicare Other

## 2019-06-25 DIAGNOSIS — E1110 Type 2 diabetes mellitus with ketoacidosis without coma: Secondary | ICD-10-CM

## 2019-06-25 DIAGNOSIS — J9600 Acute respiratory failure, unspecified whether with hypoxia or hypercapnia: Secondary | ICD-10-CM

## 2019-06-25 DIAGNOSIS — N179 Acute kidney failure, unspecified: Secondary | ICD-10-CM

## 2019-06-25 DIAGNOSIS — A419 Sepsis, unspecified organism: Principal | ICD-10-CM

## 2019-06-25 DIAGNOSIS — R6521 Severe sepsis with septic shock: Secondary | ICD-10-CM

## 2019-06-25 LAB — BASIC METABOLIC PANEL
Anion gap: 11 (ref 5–15)
Anion gap: 8 (ref 5–15)
BUN: 29 mg/dL — ABNORMAL HIGH (ref 6–20)
BUN: 23 mg/dL — ABNORMAL HIGH (ref 6–20)
CO2: 18 mmol/L — ABNORMAL LOW (ref 22–32)
CO2: 20 mmol/L — ABNORMAL LOW (ref 22–32)
Calcium: 8.2 mg/dL — ABNORMAL LOW (ref 8.9–10.3)
Calcium: 7.4 mg/dL — ABNORMAL LOW (ref 8.9–10.3)
Chloride: 112 mmol/L — ABNORMAL HIGH (ref 98–111)
Chloride: 114 mmol/L — ABNORMAL HIGH (ref 98–111)
Creatinine, Ser: 1.05 mg/dL — ABNORMAL HIGH (ref 0.44–1.00)
Creatinine, Ser: 0.74 mg/dL (ref 0.44–1.00)
GFR calc Af Amer: >60 mL/min (ref >60)
GFR calc Af Amer: >60 mL/min (ref >60)
GFR calc non Af Amer: >60 mL/min (ref >60)
GFR calc non Af Amer: >60 mL/min (ref >60)
Glucose, Bld: 303 mg/dL — ABNORMAL HIGH (ref 70–99)
Glucose, Bld: 268 mg/dL — ABNORMAL HIGH (ref 70–99)
Potassium: 3.2 mmol/L — ABNORMAL LOW (ref 3.5–5.1)
Potassium: 3.2 mmol/L — ABNORMAL LOW (ref 3.5–5.1)
Sodium: 141 mmol/L (ref 135–145)
Sodium: 142 mmol/L (ref 135–145)

## 2019-06-25 LAB — POCT I-STAT 7, (LYTES, BLD GAS, ICA,H+H)
Acid-base deficit: 6 mmol/L — ABNORMAL HIGH (ref 0.0–2.0)
Bicarbonate: 16.3 mmol/L — ABNORMAL LOW (ref 20.0–28.0)
Calcium, Ion: 1.23 mmol/L (ref 1.15–1.40)
HCT: 24 % — ABNORMAL LOW (ref 36.0–46.0)
Hemoglobin: 8.2 g/dL — ABNORMAL LOW (ref 12.0–15.0)
O2 Saturation: 99 %
Patient temperature: 38.8
Potassium: 3.3 mmol/L — ABNORMAL LOW (ref 3.5–5.1)
Sodium: 143 mmol/L (ref 135–145)
TCO2: 17 mmol/L — ABNORMAL LOW (ref 22–32)
pCO2 arterial: 24.5 mmHg — ABNORMAL LOW (ref 32.0–48.0)
pH, Arterial: 7.437 (ref 7.350–7.450)
pO2, Arterial: 118 mmHg — ABNORMAL HIGH (ref 83.0–108.0)

## 2019-06-25 LAB — CBC
HCT: 28.2 % — ABNORMAL LOW (ref 36.0–46.0)
Hemoglobin: 9.2 g/dL — ABNORMAL LOW (ref 12.0–15.0)
MCH: 27.2 pg (ref 26.0–34.0)
MCHC: 32.6 g/dL (ref 30.0–36.0)
MCV: 83.4 fL (ref 80.0–100.0)
Platelets: 245 10*3/uL (ref 150–400)
RBC: 3.38 MIL/uL — ABNORMAL LOW (ref 3.87–5.11)
RDW: 15.6 % — ABNORMAL HIGH (ref 11.5–15.5)
WBC: 22.2 10*3/uL — ABNORMAL HIGH (ref 4.0–10.5)
nRBC: 1.9 % — ABNORMAL HIGH (ref 0.0–0.2)

## 2019-06-25 LAB — URINE CULTURE

## 2019-06-25 LAB — GLUCOSE, CAPILLARY
Glucose-Capillary: 224 mg/dL — ABNORMAL HIGH (ref 70–99)
Glucose-Capillary: 229 mg/dL — ABNORMAL HIGH (ref 70–99)
Glucose-Capillary: 242 mg/dL — ABNORMAL HIGH (ref 70–99)
Glucose-Capillary: 260 mg/dL — ABNORMAL HIGH (ref 70–99)
Glucose-Capillary: 270 mg/dL — ABNORMAL HIGH (ref 70–99)
Glucose-Capillary: 315 mg/dL — ABNORMAL HIGH (ref 70–99)
Glucose-Capillary: 336 mg/dL — ABNORMAL HIGH (ref 70–99)

## 2019-06-25 LAB — SODIUM, URINE, RANDOM: Sodium, Ur: 51 mmol/L

## 2019-06-25 LAB — BETA-HYDROXYBUTYRIC ACID: Beta-Hydroxybutyric Acid: 0.1 mmol/L (ref 0.05–0.27)

## 2019-06-25 LAB — LACTIC ACID, PLASMA
Lactic Acid, Venous: 4.1 mmol/L (ref 0.5–1.9)
Lactic Acid, Venous: 1.7 mmol/L (ref 0.5–1.9)

## 2019-06-25 LAB — CREATININE, URINE, RANDOM: Creatinine, Urine: 58.9 mg/dL

## 2019-06-25 MED ORDER — VANCOMYCIN HCL 500 MG IV SOLR
500.0000 mg | INTRAVENOUS | Status: AC
  Administered 2019-06-25: 500 mg via INTRAVENOUS
  Filled 2019-06-25: qty 500

## 2019-06-25 MED ORDER — HALOPERIDOL 5 MG PO TABS
10.0000 mg | ORAL_TABLET | Freq: Every day | ORAL | Status: DC
  Administered 2019-06-25: 10 mg via ORAL
  Filled 2019-06-25: qty 2

## 2019-06-25 MED ORDER — INSULIN DETEMIR 100 UNIT/ML ~~LOC~~ SOLN
40.0000 [IU] | Freq: Two times a day (BID) | SUBCUTANEOUS | Status: DC
  Administered 2019-06-25 – 2019-06-27 (×4): 40 [IU] via SUBCUTANEOUS
  Filled 2019-06-25 (×7): qty 0.4

## 2019-06-25 MED ORDER — SODIUM CHLORIDE 0.9 % IV BOLUS
1000.0000 mL | Freq: Once | INTRAVENOUS | Status: AC
  Administered 2019-06-25: 06:00:00 1000 mL via INTRAVENOUS

## 2019-06-25 MED ORDER — BENZTROPINE MESYLATE 1 MG PO TABS
1.0000 mg | ORAL_TABLET | Freq: Two times a day (BID) | ORAL | Status: DC
  Administered 2019-06-26 – 2019-06-30 (×9): 1 mg
  Filled 2019-06-25 (×9): qty 1

## 2019-06-25 MED ORDER — BENZTROPINE MESYLATE 1 MG PO TABS
1.0000 mg | ORAL_TABLET | Freq: Two times a day (BID) | ORAL | Status: DC
  Administered 2019-06-25 (×2): 1 mg via ORAL
  Filled 2019-06-25 (×2): qty 1

## 2019-06-25 MED ORDER — HALOPERIDOL 5 MG PO TABS
5.0000 mg | ORAL_TABLET | Freq: Every morning | ORAL | Status: DC
  Administered 2019-06-25: 5 mg via ORAL
  Filled 2019-06-25: qty 1

## 2019-06-25 MED ORDER — STERILE WATER FOR INJECTION IV SOLN
INTRAVENOUS | Status: DC
  Filled 2019-06-25 (×3): qty 850

## 2019-06-25 MED ORDER — POTASSIUM CHLORIDE 20 MEQ PO PACK
40.0000 meq | PACK | ORAL | Status: AC
  Administered 2019-06-25 (×2): 40 meq
  Filled 2019-06-25 (×2): qty 2

## 2019-06-25 MED ORDER — INSULIN ASPART 100 UNIT/ML ~~LOC~~ SOLN
15.0000 [IU] | Freq: Once | SUBCUTANEOUS | Status: AC
  Administered 2019-06-25: 15 [IU] via SUBCUTANEOUS

## 2019-06-25 MED ORDER — ALBUMIN HUMAN 5 % IV SOLN
12.5000 g | Freq: Once | INTRAVENOUS | Status: AC
  Administered 2019-06-25: 12.5 g via INTRAVENOUS
  Filled 2019-06-25: qty 250

## 2019-06-25 MED ORDER — INSULIN ASPART 100 UNIT/ML ~~LOC~~ SOLN
6.0000 [IU] | Freq: Four times a day (QID) | SUBCUTANEOUS | Status: DC
  Administered 2019-06-25 – 2019-06-27 (×6): 6 [IU] via SUBCUTANEOUS

## 2019-06-25 MED ORDER — HALOPERIDOL 5 MG PO TABS
5.0000 mg | ORAL_TABLET | Freq: Every morning | ORAL | Status: DC
  Filled 2019-06-25: qty 1

## 2019-06-25 MED ORDER — INSULIN ASPART 100 UNIT/ML ~~LOC~~ SOLN
8.0000 [IU] | Freq: Once | SUBCUTANEOUS | Status: AC
  Administered 2019-06-25: 8 [IU] via SUBCUTANEOUS

## 2019-06-25 MED ORDER — FENTANYL 2500MCG IN NS 250ML (10MCG/ML) PREMIX INFUSION
0.0000 ug/h | INTRAVENOUS | Status: DC
  Administered 2019-06-25: 25 ug/h via INTRAVENOUS
  Administered 2019-06-26: 75 ug/h via INTRAVENOUS
  Filled 2019-06-25 (×2): qty 250

## 2019-06-25 MED ORDER — LACTATED RINGERS IV BOLUS
500.0000 mL | Freq: Once | INTRAVENOUS | Status: AC
  Administered 2019-06-25: 500 mL via INTRAVENOUS

## 2019-06-25 MED ORDER — DIVALPROEX SODIUM ER 500 MG PO TB24: 500.0000 mg | ORAL_TABLET | Freq: Every day | ORAL | Status: DC

## 2019-06-25 MED ORDER — PHENYLEPHRINE HCL-NACL 10-0.9 MG/250ML-% IV SOLN
0.0000 ug/min | INTRAVENOUS | Status: DC
  Administered 2019-06-25: 140 ug/min via INTRAVENOUS
  Administered 2019-06-25: 90 ug/min via INTRAVENOUS
  Administered 2019-06-25: 130 ug/min via INTRAVENOUS
  Administered 2019-06-25 (×2): 110 ug/min via INTRAVENOUS
  Administered 2019-06-25: 100 ug/min via INTRAVENOUS
  Filled 2019-06-25 (×7): qty 250

## 2019-06-25 MED ORDER — INSULIN DETEMIR 100 UNIT/ML ~~LOC~~ SOLN
30.0000 [IU] | Freq: Two times a day (BID) | SUBCUTANEOUS | Status: DC
  Administered 2019-06-25: 10:00:00 30 [IU] via SUBCUTANEOUS
  Filled 2019-06-25 (×2): qty 0.3

## 2019-06-25 MED ORDER — POLYETHYLENE GLYCOL 3350 17 G PO PACK
17.0000 g | PACK | Freq: Every day | ORAL | Status: DC
  Administered 2019-06-26 – 2019-06-27 (×2): 17 g
  Filled 2019-06-25 (×2): qty 1

## 2019-06-25 MED ORDER — NOREPINEPHRINE 4 MG/250ML-% IV SOLN
0.0000 ug/min | INTRAVENOUS | Status: DC
  Administered 2019-06-25: 15 ug/min via INTRAVENOUS
  Administered 2019-06-25: 14 ug/min via INTRAVENOUS
  Administered 2019-06-25: 10 ug/min via INTRAVENOUS
  Administered 2019-06-25: 16 ug/min via INTRAVENOUS
  Administered 2019-06-26: 25 ug/min via INTRAVENOUS
  Administered 2019-06-26: 20 ug/min via INTRAVENOUS
  Administered 2019-06-26: 18 ug/min via INTRAVENOUS
  Administered 2019-06-26: 14 ug/min via INTRAVENOUS
  Administered 2019-06-26: 19 ug/min via INTRAVENOUS
  Administered 2019-06-27 (×2): 25 ug/min via INTRAVENOUS
  Administered 2019-06-27: 21 ug/min via INTRAVENOUS
  Administered 2019-06-27: 24 ug/min via INTRAVENOUS
  Filled 2019-06-25 (×13): qty 250

## 2019-06-25 MED ORDER — VANCOMYCIN HCL 1.5 G IV SOLR
1500.0000 mg | INTRAVENOUS | Status: DC
  Filled 2019-06-25: qty 1500

## 2019-06-25 MED ORDER — ACETAMINOPHEN 650 MG RE SUPP
650.0000 mg | Freq: Four times a day (QID) | RECTAL | Status: DC | PRN
  Administered 2019-06-25: 650 mg via RECTAL
  Filled 2019-06-25: qty 1

## 2019-06-25 MED ORDER — VALPROIC ACID 250 MG/5ML PO SOLN
250.0000 mg | Freq: Two times a day (BID) | ORAL | Status: DC
  Administered 2019-06-25 – 2019-06-30 (×10): 250 mg
  Filled 2019-06-25 (×12): qty 5

## 2019-06-25 MED ORDER — DOCUSATE SODIUM 50 MG/5ML PO LIQD
100.0000 mg | Freq: Two times a day (BID) | ORAL | Status: DC
  Administered 2019-06-26 – 2019-06-27 (×4): 100 mg
  Filled 2019-06-25 (×4): qty 10

## 2019-06-25 MED ORDER — HALOPERIDOL 5 MG PO TABS: 10.0000 mg | ORAL_TABLET | Freq: Every day | ORAL | Status: DC

## 2019-06-25 MED ORDER — VITAL HIGH PROTEIN PO LIQD
1000.0000 mL | ORAL | Status: DC
  Administered 2019-06-25 – 2019-06-26 (×2): 1000 mL

## 2019-06-25 NOTE — Procedures (Signed)
Central Venous Catheter Insertion Procedure Note Calista Crain [id number] Jun 22, 1975  Procedure: Insertion of Central Venous Catheter Indications: Drug and/or fluid administration  Procedure Details Consent: Risks of procedure as well as the alternatives and risks of each were explained to the (patient/caregiver).  Consent for procedure obtained. Time Out: Verified patient identification, verified procedure, site/side was marked, verified correct patient position, special equipment/implants available, medications/allergies/relevent history reviewed, required imaging and test results available.  Performed  Maximum sterile technique was used including antiseptics, cap, gloves, gown, hand hygiene, mask and sheet. Skin prep: Chlorhexidine; local anesthetic administered A antimicrobial bonded/coated triple lumen catheter was placed in the right internal jugular vein using the Seldinger technique. Catheter placed to 16 cm. Blood aspirated via all 3 ports and then flushed x 3. Line sutured x 3 and dressing applied.  Ultrasound guidance used.Yes.    Evaluation Blood flow good Complications: No apparent complications Patient did tolerate procedure well. Chest X-ray ordered to verify placement.  CXR: pending.  Dr. Verdene Lennert Internal Medicine PGY-1  Pager: [phone] 06/25/2019, 9:49 AM

## 2019-06-25 NOTE — Anesthesia Postprocedure Evaluation (Signed)
Anesthesia Post Note  Patient: Kimberly Orozco  Procedure(s) Performed: IRRIGATION AND DEBRIDEMENT RIGHT GROIN AND RIGHT BUTTOCK (Right Groin)     Patient location during evaluation: PACU Anesthesia Type: General Level of consciousness: awake and alert Pain management: pain level controlled Vital Signs Assessment: post-procedure vital signs reviewed and stable Respiratory status: spontaneous breathing, nonlabored ventilation and respiratory function stable Cardiovascular status: blood pressure returned to baseline and stable Postop Assessment: no apparent nausea or vomiting Anesthetic complications: no    Last Vitals:  Vitals:   06/25/19 0652 06/25/19 0749  BP:  117/78  Pulse: 79 80  Resp: (!) 29 (!) 26  Temp: (!) 38.8 C   SpO2: 100% 100%    Last Pain:  Vitals:   06/25/19 0600  TempSrc: Bladder  PainSc:                  Lowella Curb

## 2019-06-25 NOTE — Progress Notes (Signed)
Inpatient Diabetes Program Recommendations  AACE/ADA: New Consensus Statement on Inpatient Glycemic Control (2015)  Target Ranges:  Prepandial:   less than 140 mg/dL      Peak postprandial:   less than 180 mg/dL (1-2 hours)      Critically ill patients:  140 - 180 mg/dL   Lab Results  Component Value Date   GLUCAP 229 (H) 06/25/2019   HGBA1C 14.4 (H) 04/27/2019    Review of Glycemic Control Results for BOBBIE, VIRDEN (MRN [id number]) as of 06/25/2019 10:28  Ref. Range 06/24/2019 19:46 06/24/2019 20:52 06/25/2019 00:05 06/25/2019 04:21 06/25/2019 07:40  Glucose-Capillary Latest Ref Range: 70 - 99 mg/dL 947 (H) 125 (H) 271 (H) 315 (H) 229 (H)   Diabetes history: DM 2 Current orders for Inpatient glycemic control:  Levemir 30 units bid Novolog 2-6 units Q4 hours  Inpatient Diabetes Program Recommendations:    Noted Levemir increased this am. Watch trends for now. If glucose trends continue to be elevated on the high doses of SQ insulin please place pt back on IV insulin via ICU glycemic control order set.  Thanks,  Christena Deem RN, MSN, BC-ADM Inpatient Diabetes Coordinator Team Pager [phone] (8a-5p)

## 2019-06-25 NOTE — Progress Notes (Signed)
eLink Physician-Brief Progress Note Patient Name: Kimberly Orozco DOB: 09/04/1975 MRN: [id number]   Date of Service  06/25/2019  HPI/Events of Note  Fever to 101.5 F - Already on Vancomycin and Cefepime. Tylenol per tube ordered, however, no gastric tube.   eICU Interventions  Will order: 1. D/C Tylenol per tube.  2. Tylenol Suppository 650 mg PR Q 6 hours PRN Temp > 101.0 F.     Intervention Category Major Interventions: Other:  Lenell Antu 06/25/2019, 2:56 AM

## 2019-06-25 NOTE — Progress Notes (Signed)
[facility] Progress Note  1 Day Post-Op  Subjective: Intubated and sedated. On neo but pressor requirement down some from overnight. Febrile to 101.8 overnight. Reportedly very sensitive to fentanyl and precedex. Patient has had drainage from wounds overnight which nursing staff has been reinforcing with ABD pads and kerlix as instructed. UOP low. CBGs have been elevated.   No family at bedside this AM.   Objective: Vital signs in last 24 hours: Temp:  [97.5 F (36.4 C)-101.8 F (38.8 C)] 101.8 F (38.8 C) (04/14 0652) Pulse Rate:  [36-114] 80 (04/14 0749) Resp:  [17-39] 26 (04/14 0749) BP: (60-159)/(31-102) 117/78 (04/14 0749) SpO2:  [98 %-100 %] 100 % (04/14 0749) FiO2 (%):  [40 %] 40 % (04/14 0749) Weight:  [114.6 kg] 114.6 kg (04/14 0401) Last BM Date: (PTA)  Intake/Output from previous day: 04/13 0701 - 04/14 0700 In: 7703.3 [I.V.:4909.6; IV Piggyback:2793.7] Out: 1063 [Urine:563; Blood:500] Intake/Output this shift: No intake/output data recorded.  PE: General: intubated and sedated, critically ill Heart: regular, rate, and rhythm.  Normal s1,s2. No obvious murmurs, gallops, or rubs noted.  Palpable pedal pulses bilaterally Lungs: no wheezes, rhonchi, or rales bilaterally. On the ventilator Abd/GU: cellulitis of R thigh and abdominal wall seems slightly improved, bloody and iodine colored drainage from wound, abdominal wall is soft, ND, no involuntary guarding, foley present    Lab Results:  Recent Labs    06/23/19 2018 06/23/19 2018 06/24/19 0527 06/24/19 1554 06/24/19 2220 06/25/19 0625  WBC 23.9*  --  20.3*  --   --   --   HGB 12.9   < > 13.6   < > 7.8* 8.2*  HCT 40.0   < > 40.8   < > 23.0* 24.0*  PLT 269  --  211  --   --   --    < > = values in this interval not displayed.   BMET Recent Labs    06/24/19 2006 06/24/19 2220 06/25/19 0624 06/25/19 0625  NA 145   < > 141 143  K 3.4*   < > 3.2* 3.3*  CL 109  --  112*  --   CO2 31  --   18*  --   GLUCOSE 126*  --  303*  --   BUN 22*  --  29*  --   CREATININE 0.76  --  1.05*  --   CALCIUM 6.8*  --  8.2*  --    < > = values in this interval not displayed.   PT/INR Recent Labs    06/23/19 1500  LABPROT 18.5*  INR 1.6*   CMP     Component Value Date/Time   NA 143 06/25/2019 0625   K 3.3 (L) 06/25/2019 0625   CL 112 (H) 06/25/2019 0624   CO2 18 (L) 06/25/2019 0624   GLUCOSE 303 (H) 06/25/2019 0624   BUN 29 (H) 06/25/2019 0624   CREATININE 1.05 (H) 06/25/2019 0624   CALCIUM 8.2 (L) 06/25/2019 0624   PROT 7.2 06/23/2019 1500   ALBUMIN 1.6 (L) 06/23/2019 1500   AST 27 06/23/2019 1500   ALT 26 06/23/2019 1500   ALKPHOS 149 (H) 06/23/2019 1500   BILITOT 1.2 06/23/2019 1500   GFRNONAA >60 06/25/2019 0624   GFRAA >60 06/25/2019 0624   Lipase  No results found for: LIPASE     Studies/Results: CT Head Wo Contrast  Result Date: 06/23/2019 CLINICAL DATA:  Encephalopathy EXAM: CT HEAD WITHOUT CONTRAST TECHNIQUE: Contiguous axial images were obtained  from the base of the skull through the vertex without intravenous contrast. COMPARISON:  None. FINDINGS: Brain: No evidence of acute infarction, hemorrhage, hydrocephalus, extra-axial collection or mass lesion/mass effect. Vascular: Atherosclerotic calcification of the carotid siphons. No hyperdense vessel. Skull: No calvarial fracture or suspicious osseous lesion. No scalp swelling or hematoma. Sinuses/Orbits: Minimal thickening in the left maxillary sinus, possibly a Don the genicular origin given periapical lucency of the bilateral maxillary molars. Remaining paranasal sinuses and mastoid air cells are predominantly clear. Debris within the external auditory canals may reflect cerumen impaction. Included orbital structures are unremarkable. Other: None IMPRESSION: 1. No acute intracranial findings. 2. Debris within the external auditory canals may reflect cerumen impaction. 3. Minimal left maxillary sinus disease, possibly  odontogenic origin given periapical lucency of the bilateral maxillary molars. Correlate with dental examination. Electronically Signed   By: Kreg Shropshire M.D.   On: 06/23/2019 16:48   CT ABDOMEN PELVIS W CONTRAST  Result Date: 06/23/2019 CLINICAL DATA:  Erysipelas, diabetes, obesity, fever EXAM: CT ABDOMEN AND PELVIS WITH CONTRAST TECHNIQUE: Multidetector CT imaging of the abdomen and pelvis was performed using the standard protocol following bolus administration of intravenous contrast. CONTRAST:  41mL OMNIPAQUE IOHEXOL 300 MG/ML  SOLN COMPARISON:  None. FINDINGS: Lower chest: No acute pleural or parenchymal lung disease. Hepatobiliary: Calcified gallstone is identified without evidence of cholecystitis. Liver enhances normally. No biliary dilation. Pancreas: Unremarkable. No pancreatic ductal dilatation or surrounding inflammatory changes. Evaluation limited by respiratory motion. Spleen: Normal in size without focal abnormality. Adrenals/Urinary Tract: Adrenal glands are unremarkable. Kidneys are normal, without renal calculi, focal lesion, or hydronephrosis. Bladder is unremarkable. Evaluation limited by respiratory motion. Stomach/Bowel: No bowel obstruction or ileus. Normal appendix right lower quadrant. No bowel wall thickening or inflammatory changes. Evaluation limited by respiratory motion. Vascular/Lymphatic: Borderline enlarged lymph nodes are seen within the bilateral inguinal regions, right greater than left, compatible with reactive adenopathy. Largest lymph node measures 13 mm in short axis in the right inguinal region image 87. Vascular structures enhance normally. Reproductive: The uterus is enlarged and heterogeneous consistent with multiple fibroids. Largest fibroid within the uterine fundus measures up to 8.4 cm. There are no adnexal masses. Other: There is extensive dermal thickening and subcutaneous fat stranding within the lower anterior abdominal wall, bilateral inguinal regions, and  extending along the right-sided labia. Findings are consistent with infection given clinical history. No fluid collection or abscess. There is no free fluid or free gas within the abdomen or pelvis. No abdominal wall hernia. Musculoskeletal: No acute or destructive bony lesions. Reconstructed images demonstrate no additional findings. IMPRESSION: 1. Extensive dermal thickening and subcutaneous fat stranding within the lower anterior abdominal wall, bilateral inguinal regions, and right-side perineum consistent with infection. No fluid collection or abscess. 2. Fibroid uterus. 3. Cholelithiasis without cholecystitis. Electronically Signed   By: Sharlet Salina M.D.   On: 06/23/2019 16:52   CT FEMUR RIGHT W CONTRAST  Result Date: 06/23/2019 CLINICAL DATA:  Erysipelas, fever EXAM: CT OF THE LOWER RIGHT EXTREMITY WITH CONTRAST TECHNIQUE: Multidetector CT imaging of the lower right extremity was performed according to the standard protocol following intravenous contrast administration. COMPARISON:  None. CONTRAST:  46mL OMNIPAQUE IOHEXOL 300 MG/ML  SOLN FINDINGS: Bones/Joint/Cartilage No acute or destructive bony lesions. Moderate 3 compartmental right knee osteoarthritis greatest in the patellofemoral compartment. There is moderate to severe right hip osteoarthritis with severe joint space narrowing and osteophyte formation. Ligaments Suboptimally assessed by CT. Muscles and Tendons Mild diffuse muscular atrophy.  No focal  abnormalities. Soft tissues There is extensive subcutaneous fat stranding and dermal thickening extending along the lower anterior abdominal wall, right inguinal region, right labia, and proximal anterior thigh. Findings are compatible with infection given clinical history. There is no underlying fluid collection or abscess. Small reactive lymph nodes are seen within the right inguinal region, largest measuring 13 mm in short axis. Reconstructed images demonstrate no additional findings.  IMPRESSION: 1. Dermal thickening and subcutaneous fat stranding extending from the lower anterior abdominal wall through the right inguinal region and perineum, and involving the anterior proximal right thigh. Findings are consistent with infection. No fluid collection or abscess. 2. Significant osteoarthritis of the right hip and knee. 3. Reactive lymph nodes right inguinal region. Electronically Signed   By: Randa Ngo M.D.   On: 06/23/2019 16:54   Portable Chest x-ray  Result Date: 06/24/2019 CLINICAL DATA:  Endotracheal tube. EXAM: PORTABLE CHEST 1 VIEW COMPARISON:  June 23, 2019. FINDINGS: Stable cardiomediastinal silhouette. Endotracheal tube is in grossly good position. No pneumothorax or pleural effusion is noted. Stable elevated right hemidiaphragm is noted. No acute pulmonary disease is noted. Bony thorax is unremarkable. IMPRESSION: Endotracheal tube in grossly good position. No acute cardiopulmonary abnormality seen. Electronically Signed   By: Marijo Conception M.D.   On: 06/24/2019 14:55   DG Chest Port 1 View  Result Date: 06/23/2019 CLINICAL DATA:  Fever, altered mental status EXAM: PORTABLE CHEST 1 VIEW COMPARISON:  None. FINDINGS: Marked elevation of the right hemidiaphragm, a nonspecific finding. Adjacent atelectatic changes are noted. No consolidative opacity, pneumothorax or effusion. No convincing features of edema. Cardiomediastinal contours are shifted rightward though this is likely positional given the appearance of rotation on this exam. No acute osseous or soft tissue abnormality. IMPRESSION: 1. Marked elevation of the right hemidiaphragm, a nonspecific finding. Adjacent atelectatic changes. No consolidative opacity or effusion. 2. Apparent rightward mediastinal shift, likely positional. Electronically Signed   By: Lovena Le M.D.   On: 06/23/2019 15:42    Anti-infectives: Anti-infectives (From admission, onward)   Start     Dose/Rate Route Frequency Ordered Stop    06/24/19 1600  vancomycin (VANCOREADY) IVPB 750 mg/150 mL  Status:  Discontinued     750 mg 150 mL/hr over 60 Minutes Intravenous Every 12 hours 06/23/19 1700 06/24/19 0714   06/24/19 1600  vancomycin (VANCOCIN) IVPB 1000 mg/200 mL premix  Status:  Discontinued     1,000 mg 200 mL/hr over 60 Minutes Intravenous Every 12 hours 06/24/19 0714 06/24/19 0750   06/24/19 1400  clindamycin (CLEOCIN) IVPB 900 mg     900 mg 100 mL/hr over 30 Minutes Intravenous Every 8 hours 06/24/19 0756     06/24/19 1145  ceFEPIme (MAXIPIME) 2 g in sodium chloride 0.9 % 100 mL IVPB  Status:  Discontinued     2 g 200 mL/hr over 30 Minutes Intravenous To ShortStay Surgical 06/24/19 1131 06/24/19 1457   06/24/19 1030  ceFEPIme (MAXIPIME) 2 g in sodium chloride 0.9 % 100 mL IVPB     2 g 200 mL/hr over 30 Minutes Intravenous Every 8 hours 06/24/19 0714     06/24/19 0800  vancomycin (VANCOCIN) IVPB 1000 mg/200 mL premix     1,000 mg 200 mL/hr over 60 Minutes Intravenous Every 12 hours 06/24/19 0750     06/24/19 0300  ceFEPIme (MAXIPIME) 2 g in sodium chloride 0.9 % 100 mL IVPB  Status:  Discontinued     2 g 200 mL/hr over 30 Minutes Intravenous Every  12 hours 06/23/19 1700 06/24/19 0714   06/23/19 1700  clindamycin (CLEOCIN) IVPB 600 mg  Status:  Discontinued     600 mg 100 mL/hr over 30 Minutes Intravenous Every 8 hours 06/23/19 1649 06/24/19 0756   06/23/19 1500  vancomycin (VANCOREADY) IVPB 2000 mg/400 mL     2,000 mg 200 mL/hr over 120 Minutes Intravenous  Once 06/23/19 1454 06/23/19 1747   06/23/19 1445  ceFEPIme (MAXIPIME) 2 g in sodium chloride 0.9 % 100 mL IVPB     2 g 200 mL/hr over 30 Minutes Intravenous  Once 06/23/19 1434 06/23/19 1557   06/23/19 1445  metroNIDAZOLE (FLAGYL) IVPB 500 mg     500 mg 100 mL/hr over 60 Minutes Intravenous  Once 06/23/19 1434 06/23/19 1608   06/23/19 1445  vancomycin (VANCOCIN) IVPB 1000 mg/200 mL premix  Status:  Discontinued     1,000 mg 200 mL/hr over 60 Minutes  Intravenous  Once 06/23/19 1434 06/23/19 1440   06/23/19 1445  vancomycin (VANCOCIN) 2,000 mg in sodium chloride 0.9 % 500 mL IVPB  Status:  Discontinued     2,000 mg 250 mL/hr over 120 Minutes Intravenous  Once 06/23/19 1440 06/23/19 1454       Assessment/Plan T2DM with DKA - CBGs in the 200-300s this AM Acute renal failure - Cr up to 1.05 from 0.7, UOP low, continue IVF Hypokalemia - K 3.3 this AM, replacement per CCM Schizophrenia Bipolar disorder ABL anemia - hgb 8.2 this AM, drainage from wound not significantly bloody, continue to monitor  Septic shock - on neo Lactic acidosis - 4.1, continue resuscitation  NSTI of R abd wall and groin S/P I&D 45 x 30 x 4 cm 06/24/19 Dr. Derrell Lolling - CBC not drawn yet, febrile to 101.8 overnight - Patient remains critically ill but wound edges appear ok and surrounding cellulitis seems improved - cxs pending, continue broad spectrum abx - to OR tomorrow for further I&D and dressing change   FEN: NPO, IVF; ok to start some TF today but hold after MN tonight  VTE: SCDs, SQ heparin  ID: flagyl 4/12; vanc 4/12>>, cefepime 4/12>>, clinda 4/13>>  LOS: 2 days    Kimberly Orozco , [facility] Surgery 06/25/2019, 8:32 AM Please see Amion for pager number during day hours 7:00am-4:30pm

## 2019-06-25 NOTE — Progress Notes (Signed)
NAME:  Kimberly Orozco, MRN:  [id number], DOB:  1975-12-04, LOS: 2 ADMISSION DATE:  06/23/2019, CONSULTATION DATE:  06/23/2019 REFERRING MD:  Dr. Criss Alvine CHIEF COMPLAINT:  AMS   Brief History   [name] is a 44 y/o female with a PMHx of T2DM, Schizophrenia, Bipolar Disorder who presents to the ED with c/o AMS and found to be in diabetic ketoacidosis complicated by necrotizing soft tissue infection.   History of present illness   [name] is a 44 y/o female with a PMHx of T2DM, Schizophrenia, Bipolar Disorder who presents to the ED with c/o AMS.   Per chart review, patient has been experiencing confusion for the past few days. This is in light of missing her diabetic (metformin and insulin) and psychiatric medications for the last 1 month. Ms. Deshmukh denies any pain at this time. She is unaware of any rash or skin lesions, denies any trauma to her groin or abdomen.   Past Medical History  - T2DM - Schizophrenia  - Bipolar Disorder   Significant Hospital Events   4/12: Admitted to the ICU  Consults:  General Surgery  Procedures:  4/14 Central Line Placement >>   Significant Diagnostic Tests:   CT abdomen/pelvis (4/12):  1. Extensive dermal thickening and subcutaneous fat stranding within the lower anterior abdominal wall, bilateral inguinal regions, and right-side perineum consistent with infection. No fluid collection or abscess. 2. Fibroid uterus. 3. Cholelithiasis without cholecystitis.  CT Femur (4/12):  1. Dermal thickening and subcutaneous fat stranding extending from the lower anterior abdominal wall through the right inguinal region and perineum, and involving the anterior proximal right thigh. Findings are consistent with infection. No fluid collection or abscess. 2. Significant osteoarthritis of the right hip and knee. 3. Reactive lymph nodes right inguinal region.  Micro Data:  Blood cultures (4/12): Pending Urine Cultures (4/12): Pending     Antimicrobials:  Metronidazole x1 (4/12) Cefepime (4/12) >> Vancomycin (4/12) >> Clindamycin (4/12) >>  Interim history/subjective:  Sedated on vent. Soft BP overnight on sedation, requiring increased vasopressors, despite adequate fluid resuscitation.  Objective   Blood pressure (!) 88/69, pulse 80, temperature (!) 101.5 F (38.6 C), resp. rate (!) 27, height 5\' 7"  (1.702 m), weight 114.6 kg, SpO2 100 %.    Vent Mode: PRVC FiO2 (%):  [40 %] 40 % Set Rate:  [18 bmp] 18 bmp Vt Set:  [490 mL] 490 mL PEEP:  [5 cmH20] 5 cmH20 Plateau Pressure:  [18 cmH20-22 cmH20] 18 cmH20   Intake/Output Summary (Last 24 hours) at 06/25/2019 1212 Last data filed at 06/25/2019 1100 Gross per 24 hour  Intake 8690.34 ml  Output 1258 ml  Net 7432.34 ml   Filed Weights   06/23/19 1947 06/24/19 0306 06/25/19 0401  Weight: 108.6 kg 108.6 kg 114.6 kg   Physical Exam Vitals and nursing note reviewed.  Constitutional:      Appearance: She is obese. She is ill-appearing.     Comments: Intubated and sedated  HENT:     Mouth/Throat:     Lips: Pink.     Mouth: Mucous membranes are moist.  Eyes:     Pupils: Pupils are equal, round, and reactive to light.  Cardiovascular:     Rate and Rhythm: Normal rate and regular rhythm.     Heart sounds: No murmur. No gallop.      Comments: Mechanical breath sounds Pulmonary:     Effort: Pulmonary effort is normal.     Breath sounds: No wheezing,  rhonchi or rales.  Abdominal:     General: Bowel sounds are decreased.     Palpations: Abdomen is soft.     Tenderness: There is no abdominal tenderness.     Comments: S/p larger debridement of skin and soft tissue 45cc x 30cc x 3-4cc (d) at right groin, inguinal region and RLQ. Packing in place.  Skin:    General: Skin is warm and dry.     Findings: Erythema present.     Comments: Erythema receeding post op  Neurological:     Mental Status: She is lethargic.     Comments: Sedated on MV  Psychiatric:         Behavior: Behavior is cooperative.    Resolved Hospital Problem list   Diabetic Ketoacidosis  Assessment & Plan:   Sepsis Septic Shock Necrotizing Soft Tissue Infection: In the setting of uncontrolled diabetes. Fever o/n to 101.8. Euvolemic after 8L IVF still requiring pressor support 4/14. - General surgery following - S/P large debridement (45cc x 30cm) on 4/13; Plan for revision 4/25 - Vancomycin, Cefepime, Clindamycin per pharmacy - Levophed (goal MAP >65) - Follow Fever Curve, WBC, CVP  Mechanical Ventilation - Remains on Vent post-op due to extent of excision, pain, and need for revision - PRVC 40%, PEEP 5, Rate 18 - Fentanyl gtt for pain control/sedation - Precedex gtt - VAP   AKI Hypophosphatemia Improved Hypokalemia  3.2 this AM - Renal injury in the setting of occult shock follow by occult shock in the setting of sepsis - AGMA resolved, residual NAGMA - Monitor Renal Function UOP (Minimal UOP last 24 hours) - Monitor E-lytes, Replete PRN - Continue Bicarb gtt @ 75cc/hr  Encephalopathy Schizophrenia Bipolar: Home meds benztropine, Divalproex, Haldol, Temazepam - Presented oriented to self and hospital only - Sedated on Vent at this time - Resume PO Haldol and Benztropine  Diabetes - DKA resolved - Increase Levemir to 30U BID - SSI q4h - Consider TF coverage based on response to Feeds - Place OG, begin TF  Best practice:  Diet: Starting TFs Pain/Anxiety/Delirium protocol (if indicated): Fentanyl, Precedex gtt VAP protocol (if indicated): N/A DVT prophylaxis: Heparin GI prophylaxis: PPI Glucose control: Insulin gtt Mobility: Bedrest Code Status: FULL Family Communication: Mother updated 4/14 by phone, states she may come to visit around 7pm and another daughter may come with her. Disposition: ICU  Labs   CBC: Recent Labs  Lab 06/23/19 1500 06/23/19 1747 06/23/19 2018 06/23/19 2018 06/24/19 0527 06/24/19 1554 06/24/19 2220 06/25/19 0624  06/25/19 0625  WBC 27.3*  --  23.9*  --  20.3*  --   --  22.2*  --   NEUTROABS 22.4*  --   --   --   --   --   --   --   --   HGB 13.7   < > 12.9   < > 13.6 11.6* 7.8* 9.2* 8.2*  HCT 45.7   < > 40.0   < > 40.8 34.0* 23.0* 28.2* 24.0*  MCV 89.1  --  82.6  --  79.7*  --   --  83.4  --   PLT 314  --  269  --  211  --   --  245  --    < > = values in this interval not displayed.    Basic Metabolic Panel: Recent Labs  Lab 06/24/19 0135 06/24/19 0135 06/24/19 0527 06/24/19 0527 06/24/19 1443 06/24/19 1443 06/24/19 1554 06/24/19 2006 06/24/19 2220 06/25/19 9379 06/25/19 0240  NA 143   < > 144   < > 144   < > 143 145 142 141 143  K 2.8*   < > 2.9*   < > 3.9   < > 3.0* 3.4* 3.5 3.2* 3.3*  CL 120*  --  117*  --  120*  --   --  109  --  112*  --   CO2 14*  --  14*  --  14*  --   --  31  --  18*  --   GLUCOSE 245*  --  210*  --  194*  --   --  126*  --  303*  --   BUN 25*  --  25*  --  24*  --   --  22*  --  29*  --   CREATININE 0.87  --  0.78  --  0.82  --   --  0.76  --  1.05*  --   CALCIUM 9.7  --  9.8  --  8.9  --   --  6.8*  --  8.2*  --   MG  --   --  2.2  --   --   --   --   --   --   --   --   PHOS  --   --  <1.0*  --  3.4  --   --   --   --   --   --    < > = values in this interval not displayed.   GFR: Estimated Creatinine Clearance: 89.4 mL/min (A) (by C-G formula based on SCr of 1.05 mg/dL (H)). Recent Labs  Lab 06/23/19 1500 06/23/19 1822 06/23/19 2018 06/23/19 2234 06/24/19 0527 06/25/19 0624  WBC 27.3*  --  23.9*  --  20.3* 22.2*  LATICACIDVEN 4.1* 3.9*  --  3.4*  --  4.1*    Liver Function Tests: Recent Labs  Lab 06/23/19 1500  AST 27  ALT 26  ALKPHOS 149*  BILITOT 1.2  PROT 7.2  ALBUMIN 1.6*   No results for input(s): LIPASE, AMYLASE in the last 168 hours. Recent Labs  Lab 06/23/19 1501  AMMONIA 72*    ABG    Component Value Date/Time   PHART 7.437 06/25/2019 0625   PCO2ART 24.5 (L) 06/25/2019 0625   PO2ART 118.0 (H) 06/25/2019 0625    HCO3 16.3 (L) 06/25/2019 0625   TCO2 17 (L) 06/25/2019 0625   ACIDBASEDEF 6.0 (H) 06/25/2019 0625   O2SAT 99.0 06/25/2019 0625     Coagulation Profile: Recent Labs  Lab 06/23/19 1500  INR 1.6*    Cardiac Enzymes: No results for input(s): CKTOTAL, CKMB, CKMBINDEX, TROPONINI in the last 168 hours.  HbA1C: Hgb A1c MFr Bld  Date/Time Value Ref Range Status  04/27/2019 02:17 PM 14.4 (H) 4.8 - 5.6 % Final    Comment:    (NOTE) Pre diabetes:          5.7%-6.4% Diabetes:              >6.4% Glycemic control for   <7.0% adults with diabetes     CBG: Recent Labs  Lab 06/24/19 2052 06/25/19 0005 06/25/19 0421 06/25/19 0740 06/25/19 1126  GLUCAP 189* 336* 315* 229* 224*    Review of Systems:   Negative, except as noted above.   Past Medical History  She,  has no past medical history on file.   Surgical History    Past Surgical  History:  Procedure Laterality Date  . INCISION AND DRAINAGE PERIRECTAL ABSCESS Right 06/24/2019   Procedure: IRRIGATION AND DEBRIDEMENT RIGHT GROIN AND RIGHT BUTTOCK;  Surgeon: Axel Filler, MD;  Location: [facility] OR;  Service: General;  Laterality: Right;     Social History   reports that she has never smoked. She has never used smokeless tobacco.   Family History   Her family history is not on file.   Allergies No Known Allergies   Home Medications  Prior to Admission medications   Medication Sig Start Date End Date Taking? Authorizing Provider  acetaminophen (TYLENOL) 325 MG tablet Take 650 mg by mouth every 6 (six) hours as needed for mild pain or headache.    [provider]  benztropine (COGENTIN) 1 MG tablet Take 1 tablet (1 mg total) by mouth 2 (two) times daily. 05/02/19   Malvin Johns, MD  cloNIDine (CATAPRES) 0.2 MG tablet Take 1 tablet (0.2 mg total) by mouth 2 (two) times daily. 05/02/19   Malvin Johns, MD  divalproex (DEPAKOTE SPRINKLE) 125 MG capsule Take 2 capsules (250 mg total) by mouth 2 (two) times daily. 05/02/19    Malvin Johns, MD  haloperidol (HALDOL) 10 MG tablet 1 in am 2 at hs Patient taking differently: Take 10 mg by mouth See admin instructions. 10mg  in in the morning, and 20mg  at bedtime. 05/02/19   , MD  haloperidol decanoate (HALDOL DECANOATE) 100 MG/ML injection Inject 1 mL (100 mg total) into the muscle every 30 (thirty) days. Due 3/15 05/29/19   4/15, MD  insulin aspart (NOVOLOG) 100 UNIT/ML injection Inject 4 Units into the skin 3 (three) times daily with meals. 05/02/19   Malvin Johns, MD  metFORMIN (GLUCOPHAGE-XR) 500 MG 24 hr tablet Take 1 tablet (500 mg total) by mouth daily with supper. 05/02/19   Malvin Johns, MD  propranolol ER (INDERAL LA) 80 MG 24 hr capsule Take 1 capsule (80 mg total) by mouth daily. 05/02/19 05/01/20  05/04/19, MD  temazepam (RESTORIL) 30 MG capsule Take 1 capsule (30 mg total) by mouth at bedtime. 05/02/19   Malvin Johns, MD    05/04/19, DO IM PGY-3 Pager: [phone]

## 2019-06-25 NOTE — Progress Notes (Addendum)
eLink Physician-Brief Progress Note Patient Name: Cathaleen Korol DOB: [birth year] MRN: [id number]   Date of Service  06/25/2019  HPI/Events of Note  Hyperglycemia - Blood glucose = 315. Last pH = 7.358.  eICU Interventions  Will order: 1. D/C D5 0.45 IV infusion. 2. Novolog 15 units [place] now.  3. D/C NaHCO3 IV infusion.      Intervention Category Major Interventions: Hyperglycemia - active titration of insulin therapy  Lenell Antu 06/25/2019, 4:35 AM

## 2019-06-25 NOTE — Progress Notes (Signed)
eLink Physician-Brief Progress Note Patient Name: Kimberly Orozco DOB: 12/26/1975 MRN: [id number]   Date of Service  06/25/2019  HPI/Events of Note  Blood glucose = 336. Patient is on Levemir + Q 4 hour Novolog SSI + D5 0.45 NaCl IV infusion at 75 mL/hour.  eICU Interventions  Will order: 1. Decrease D5 0.45 NaCl to 30 mL/hour. 2. Novolog 8 units [place] now.      Intervention Category Major Interventions: Hyperglycemia - active titration of insulin therapy  Lenell Antu 06/25/2019, 12:46 AM

## 2019-06-25 NOTE — Progress Notes (Addendum)
Pharmacy Antibiotic Note  Kimberly Orozco is a 44 y.o. female admitted on 06/23/2019 with sepsis.  Pharmacy has been consulted for Vancomycin and Cefepime dosing. Pt febrile with Tm 101.8. WBC elevated at 20.3, lactate 3.4.Scr 0.76>>1.05. Patient with severe cellulitis of R leg tracking up to abdomen. S/p I&D debridement on 4/13, another planned for 4/15. Paint also on clindamycin for possible necrotizing fascitis.   Plan: - Adjusted Vancomycin to 1500 mg IV Q24H (estUC 439, Scr 1.05, Vd 0.5) - Continue Cefepime to 2g IV Q8H  - Monitor renal function, cultures/sensitivities, clinical progression - Check vancomycin levels as indicated  Height: 5\' 7"  (170.2 cm) Weight: 114.6 kg (252 lb 10.4 oz) IBW/kg (Calculated) : 61.6  Temp (24hrs), Avg:100 F (37.8 C), Min:97.5 F (36.4 C), Max:101.8 F (38.8 C)  Recent Labs  Lab 06/23/19 1500 06/23/19 1700 06/23/19 1822 06/23/19 2018 06/23/19 2018 06/23/19 2234 06/23/19 2234 06/24/19 0135 06/24/19 0527 06/24/19 1443 06/24/19 2006 06/25/19 0624  WBC 27.3*  --   --  23.9*  --   --   --   --  20.3*  --   --  22.2*  CREATININE 1.91*   < >  --  1.23*   < > 0.98   < > 0.87 0.78 0.82 0.76 1.05*  LATICACIDVEN 4.1*  --  3.9*  --   --  3.4*  --   --   --   --   --  4.1*   < > = values in this interval not displayed.    Estimated Creatinine Clearance: 89.4 mL/min (A) (by C-G formula based on SCr of 1.05 mg/dL (H)).    No Known Allergies  Antimicrobials this admission: Vanc 4/12 >> Cefepime 4/12>> Clindamycin 4/12>> Flagyl 4/12 x1  Dose adjustments this admission: N/A  Microbiology results: 4/13 Surg cx: gram +/- rods (on abx) 4/12 UCx - multiple species 4/12 MRSA PCR - positive 4/12 Staph Aur - positive 4/12 Covid - negative 4/12 Influenza - negative  6/12, PharmD PGY1 Ambulatory Care Resident [employer] # [phone]  Please check [email or web address] for unit-specific pharmacy phone numbers.

## 2019-06-26 ENCOUNTER — Encounter (HOSPITAL_COMMUNITY)
Admission: EM | Disposition: A | Discharge status: Skilled Nursing Facility | Payer: Self-pay | Source: Home / Self Care | Attending: Internal Medicine

## 2019-06-26 ENCOUNTER — Encounter (HOSPITAL_COMMUNITY): Payer: Self-pay | Admitting: Internal Medicine

## 2019-06-26 ENCOUNTER — Inpatient Hospital Stay (HOSPITAL_COMMUNITY): Payer: Medicare Other | Admitting: Certified Registered Nurse Anesthetist

## 2019-06-26 DIAGNOSIS — J9601 Acute respiratory failure with hypoxia: Secondary | ICD-10-CM

## 2019-06-26 DIAGNOSIS — L0390 Cellulitis, unspecified: Secondary | ICD-10-CM

## 2019-06-26 HISTORY — PX: INCISION AND DRAINAGE OF WOUND: SHX1803

## 2019-06-26 LAB — BASIC METABOLIC PANEL
Anion gap: 8 (ref 5–15)
Anion gap: 10 (ref 5–15)
BUN: 19 mg/dL (ref 6–20)
BUN: 18 mg/dL (ref 6–20)
CO2: 23 mmol/L (ref 22–32)
CO2: 23 mmol/L (ref 22–32)
Calcium: 7.4 mg/dL — ABNORMAL LOW (ref 8.9–10.3)
Calcium: 7 mg/dL — ABNORMAL LOW (ref 8.9–10.3)
Chloride: 115 mmol/L — ABNORMAL HIGH (ref 98–111)
Chloride: 111 mmol/L (ref 98–111)
Creatinine, Ser: 0.64 mg/dL (ref 0.44–1.00)
Creatinine, Ser: 0.61 mg/dL (ref 0.44–1.00)
GFR calc Af Amer: >60 mL/min (ref >60)
GFR calc Af Amer: >60 mL/min (ref >60)
GFR calc non Af Amer: >60 mL/min (ref >60)
GFR calc non Af Amer: >60 mL/min (ref >60)
Glucose, Bld: 167 mg/dL — ABNORMAL HIGH (ref 70–99)
Glucose, Bld: 231 mg/dL — ABNORMAL HIGH (ref 70–99)
Potassium: 2.6 mmol/L — CL (ref 3.5–5.1)
Potassium: 3.9 mmol/L (ref 3.5–5.1)
Sodium: 146 mmol/L — ABNORMAL HIGH (ref 135–145)
Sodium: 144 mmol/L (ref 135–145)

## 2019-06-26 LAB — CBC
HCT: 24.3 % — ABNORMAL LOW (ref 36.0–46.0)
Hemoglobin: 7.9 g/dL — ABNORMAL LOW (ref 12.0–15.0)
MCH: 26.2 pg (ref 26.0–34.0)
MCHC: 32.5 g/dL (ref 30.0–36.0)
MCV: 80.7 fL (ref 80.0–100.0)
Platelets: 209 10*3/uL (ref 150–400)
RBC: 3.01 MIL/uL — ABNORMAL LOW (ref 3.87–5.11)
RDW: 15.4 % (ref 11.5–15.5)
WBC: 23.3 10*3/uL — ABNORMAL HIGH (ref 4.0–10.5)
nRBC: 2.7 % — ABNORMAL HIGH (ref 0.0–0.2)

## 2019-06-26 LAB — GLUCOSE, CAPILLARY
Glucose-Capillary: 130 mg/dL — ABNORMAL HIGH (ref 70–99)
Glucose-Capillary: 138 mg/dL — ABNORMAL HIGH (ref 70–99)
Glucose-Capillary: 168 mg/dL — ABNORMAL HIGH (ref 70–99)
Glucose-Capillary: 188 mg/dL — ABNORMAL HIGH (ref 70–99)
Glucose-Capillary: 208 mg/dL — ABNORMAL HIGH (ref 70–99)
Glucose-Capillary: 248 mg/dL — ABNORMAL HIGH (ref 70–99)

## 2019-06-26 LAB — PHOSPHORUS: Phosphorus: 1.8 mg/dL — ABNORMAL LOW (ref 2.5–4.6)

## 2019-06-26 LAB — SURGICAL PATHOLOGY

## 2019-06-26 LAB — LACTIC ACID, PLASMA: Lactic Acid, Venous: 1.7 mmol/L (ref 0.5–1.9)

## 2019-06-26 LAB — MAGNESIUM: Magnesium: 1.7 mg/dL (ref 1.7–2.4)

## 2019-06-26 SURGERY — IRRIGATION AND DEBRIDEMENT WOUND
Anesthesia: General | Site: Groin

## 2019-06-26 MED ORDER — LIDOCAINE 2% (20 MG/ML) 5 ML SYRINGE
INTRAMUSCULAR | Status: AC
  Filled 2019-06-26: qty 5

## 2019-06-26 MED ORDER — SUCCINYLCHOLINE CHLORIDE 200 MG/10ML IV SOSY
PREFILLED_SYRINGE | INTRAVENOUS | Status: AC
  Filled 2019-06-26: qty 10

## 2019-06-26 MED ORDER — ROCURONIUM BROMIDE 100 MG/10ML IV SOLN
INTRAVENOUS | Status: DC | PRN
  Administered 2019-06-26 (×3): 50 mg via INTRAVENOUS

## 2019-06-26 MED ORDER — LACTATED RINGERS IV SOLN: INTRAVENOUS | Status: DC | PRN

## 2019-06-26 MED ORDER — FENTANYL CITRATE (PF) 250 MCG/5ML IJ SOLN
INTRAMUSCULAR | Status: AC
  Filled 2019-06-26: qty 5

## 2019-06-26 MED ORDER — POTASSIUM CHLORIDE 20 MEQ/15ML (10%) PO SOLN
40.0000 meq | Freq: Once | ORAL | Status: AC
  Administered 2019-06-26: 40 meq
  Filled 2019-06-26: qty 30

## 2019-06-26 MED ORDER — MIDAZOLAM HCL 2 MG/2ML IJ SOLN
INTRAMUSCULAR | Status: AC
  Filled 2019-06-26: qty 2

## 2019-06-26 MED ORDER — HALOPERIDOL 5 MG PO TABS
10.0000 mg | ORAL_TABLET | Freq: Every morning | ORAL | Status: DC
  Administered 2019-06-26 – 2019-06-30 (×5): 10 mg
  Filled 2019-06-26 (×6): qty 2

## 2019-06-26 MED ORDER — ROCURONIUM BROMIDE 10 MG/ML (PF) SYRINGE
PREFILLED_SYRINGE | INTRAVENOUS | Status: AC
  Filled 2019-06-26: qty 10

## 2019-06-26 MED ORDER — KETOROLAC TROMETHAMINE 30 MG/ML IJ SOLN
INTRAMUSCULAR | Status: AC
  Filled 2019-06-26: qty 1

## 2019-06-26 MED ORDER — POTASSIUM PHOSPHATES 15 MMOLE/5ML IV SOLN
30.0000 mmol | Freq: Once | INTRAVENOUS | Status: AC
  Administered 2019-06-26: 30 mmol via INTRAVENOUS
  Filled 2019-06-26: qty 10

## 2019-06-26 MED ORDER — VANCOMYCIN HCL IN DEXTROSE 1-5 GM/200ML-% IV SOLN
1000.0000 mg | Freq: Two times a day (BID) | INTRAVENOUS | Status: DC
  Administered 2019-06-26 – 2019-07-04 (×17): 1000 mg via INTRAVENOUS
  Filled 2019-06-26 (×20): qty 200

## 2019-06-26 MED ORDER — VANCOMYCIN HCL 1500 MG/300ML IV SOLN
1500.0000 mg | INTRAVENOUS | Status: DC
  Filled 2019-06-26: qty 300

## 2019-06-26 MED ORDER — VITAMIN C 500 MG/5ML PO SYRP: 500.0000 mg | ORAL_SOLUTION | Freq: Two times a day (BID) | ORAL | Status: DC

## 2019-06-26 MED ORDER — PIVOT 1.5 CAL PO LIQD
1000.0000 mL | ORAL | Status: DC
  Administered 2019-06-26: 1000 mL
  Filled 2019-06-26 (×2): qty 1000

## 2019-06-26 MED ORDER — ASCORBIC ACID 500 MG PO TABS
500.0000 mg | ORAL_TABLET | Freq: Two times a day (BID) | ORAL | Status: DC
  Administered 2019-06-27 – 2019-06-30 (×7): 500 mg
  Filled 2019-06-26 (×7): qty 1

## 2019-06-26 MED ORDER — PROPOFOL 10 MG/ML IV BOLUS
INTRAVENOUS | Status: DC | PRN
  Administered 2019-06-26: 30 mg via INTRAVENOUS
  Administered 2019-06-26: 20 mg via INTRAVENOUS

## 2019-06-26 MED ORDER — ASCORBIC ACID 500 MG PO TABS
500.0000 mg | ORAL_TABLET | Freq: Two times a day (BID) | ORAL | Status: DC
  Administered 2019-06-26: 500 mg via ORAL
  Filled 2019-06-26: qty 1

## 2019-06-26 MED ORDER — SODIUM CHLORIDE 0.9% FLUSH
10.0000 mL | INTRAVENOUS | Status: DC | PRN
  Administered 2019-07-01: 10 mL

## 2019-06-26 MED ORDER — MIDAZOLAM HCL 5 MG/5ML IJ SOLN
INTRAMUSCULAR | Status: DC | PRN
  Administered 2019-06-26: 2 mg via INTRAVENOUS

## 2019-06-26 MED ORDER — POTASSIUM CHLORIDE 10 MEQ/50ML IV SOLN
10.0000 meq | INTRAVENOUS | Status: DC
  Administered 2019-06-26 (×5): 10 meq via INTRAVENOUS
  Filled 2019-06-26 (×3): qty 50

## 2019-06-26 MED ORDER — SODIUM CHLORIDE 0.9 % IR SOLN
Status: DC | PRN
  Administered 2019-06-26: 3000 mL

## 2019-06-26 MED ORDER — SODIUM CHLORIDE 0.9% FLUSH
10.0000 mL | Freq: Two times a day (BID) | INTRAVENOUS | Status: DC
  Administered 2019-06-26 – 2019-07-21 (×33): 10 mL

## 2019-06-26 MED ORDER — VANCOMYCIN HCL 1500 MG/300ML IV SOLN
1000.0000 mg | Freq: Two times a day (BID) | INTRAVENOUS | Status: DC
  Filled 2019-06-26: qty 200

## 2019-06-26 MED ORDER — ZINC SULFATE 220 (50 ZN) MG PO CAPS
220.0000 mg | ORAL_CAPSULE | Freq: Every day | ORAL | Status: DC
  Administered 2019-06-26 – 2019-06-30 (×5): 220 mg
  Filled 2019-06-26 (×5): qty 1

## 2019-06-26 MED ORDER — PROPOFOL 10 MG/ML IV BOLUS
INTRAVENOUS | Status: AC
  Filled 2019-06-26: qty 20

## 2019-06-26 MED ORDER — HALOPERIDOL 5 MG PO TABS
20.0000 mg | ORAL_TABLET | Freq: Every day | ORAL | Status: DC
  Administered 2019-06-26 – 2019-06-29 (×4): 20 mg
  Filled 2019-06-26 (×5): qty 4

## 2019-06-26 MED ORDER — 0.9 % SODIUM CHLORIDE (POUR BTL) OPTIME
TOPICAL | Status: DC | PRN
  Administered 2019-06-26 (×2): 1000 mL

## 2019-06-26 SURGICAL SUPPLY — 32 items
BNDG GAUZE ELAST 4 BULKY (GAUZE/BANDAGES/DRESSINGS) ×14 IMPLANT
CANISTER SUCT 3000ML PPV (MISCELLANEOUS) ×3 IMPLANT
COVER SURGICAL LIGHT HANDLE (MISCELLANEOUS) ×3 IMPLANT
COVER WAND RF STERILE (DRAPES) ×1 IMPLANT
DRAPE LAPAROSCOPIC ABDOMINAL (DRAPES) IMPLANT
DRAPE LAPAROTOMY 100X72 PEDS (DRAPES) IMPLANT
DRSG PAD ABDOMINAL 8X10 ST (GAUZE/BANDAGES/DRESSINGS) IMPLANT
ELECT REM PT RETURN 9FT ADLT (ELECTROSURGICAL) ×3
ELECTRODE REM PT RTRN 9FT ADLT (ELECTROSURGICAL) ×1 IMPLANT
GAUZE SPONGE 4X4 12PLY STRL (GAUZE/BANDAGES/DRESSINGS) IMPLANT
GLOVE BIO SURGEON STRL SZ7.5 (GLOVE) ×3 IMPLANT
GLOVE BIOGEL PI IND STRL 8 (GLOVE) ×1 IMPLANT
GLOVE BIOGEL PI INDICATOR 8 (GLOVE) ×2
GOWN STRL REUS W/ TWL LRG LVL3 (GOWN DISPOSABLE) ×2 IMPLANT
GOWN STRL REUS W/ TWL XL LVL3 (GOWN DISPOSABLE) ×1 IMPLANT
GOWN STRL REUS W/TWL LRG LVL3 (GOWN DISPOSABLE) ×6
GOWN STRL REUS W/TWL XL LVL3 (GOWN DISPOSABLE) ×3
HANDPIECE INTERPULSE COAX TIP (DISPOSABLE) ×3
KIT BASIN OR (CUSTOM PROCEDURE TRAY) ×3 IMPLANT
KIT TURNOVER KIT B (KITS) ×3 IMPLANT
NS IRRIG 1000ML POUR BTL (IV SOLUTION) ×3 IMPLANT
PACK GENERAL/GYN (CUSTOM PROCEDURE TRAY) ×3 IMPLANT
PAD ABD 8X10 STRL (GAUZE/BANDAGES/DRESSINGS) ×2 IMPLANT
PAD ARMBOARD 7.5X6 YLW CONV (MISCELLANEOUS) ×3 IMPLANT
PENCIL SMOKE EVACUATOR (MISCELLANEOUS) ×3 IMPLANT
SET HNDPC FAN SPRY TIP SCT (DISPOSABLE) IMPLANT
SOLUTION BETADINE 4OZ (MISCELLANEOUS) ×10 IMPLANT
SUT VIC AB 2-0 SH 18 (SUTURE) ×2 IMPLANT
SWAB COLLECTION DEVICE MRSA (MISCELLANEOUS) IMPLANT
SWAB CULTURE ESWAB REG 1ML (MISCELLANEOUS) IMPLANT
TOWEL GREEN STERILE (TOWEL DISPOSABLE) ×3 IMPLANT
TOWEL GREEN STERILE FF (TOWEL DISPOSABLE) ×3 IMPLANT

## 2019-06-26 NOTE — Progress Notes (Signed)
[facility] ADULT ICU REPLACEMENT PROTOCOL FOR AM LAB REPLACEMENT ONLY  The patient does apply for the [facility] Adult ICU Electrolyte Replacment Protocol based on the criteria listed below:   1. Is GFR >/= 40 ml/min? Yes.    Patient's GFR today is >60 2. Is urine output >/= 0.5 ml/kg/hr for the last 6 hours? Yes.   Patient's UOP is 0.88 ml/kg/hr 3. Is BUN < 60 mg/dL? Yes.    Patient's BUN today is 19 4. Abnormal electrolyte(s): K+2.6 5. Ordered repletion with: Protocol Central Line  6. If a panic level lab has been reported, has the CCM MD in charge been notified? Yes.  .   Physician:  Thresa Ross 06/26/2019 5:36 AM

## 2019-06-26 NOTE — Progress Notes (Signed)
Report given to OR, pt scheduled for surgery this AM.

## 2019-06-26 NOTE — Progress Notes (Signed)
Nutrition Follow-up  DOCUMENTATION CODES:   Obesity unspecified  INTERVENTION:   Recommend Cortrak insertion as pt will likely supplement nutrition via Cortrak with TF even once diet advanced to meet signifcantly elevated needs for wound healing, acute illness  Tube Feeding:  Pivot 1.5 at 30 ml/hr with goal rate of 65 ml/hr Provides 146 g of protein, 2340 kcals and 1186 mL  Add Vitamin C 500 mg BID, Zinc sulfate 220 mg daily for wound healing (recommend checking for deficiency in interim)   NUTRITION DIAGNOSIS:   Increased nutrient needs related to wound healing, acute illness as evidenced by estimated needs.  GOAL:   Patient will meet greater than or equal to 90% of their needs   MONITOR:   Diet advancement, Labs, Weight trends, Vent status, TF tolerance, Skin  REASON FOR ASSESSMENT:   Consult, Ventilator Enteral/tube feeding initiation and management  ASSESSMENT:   44 yo female admitted with DKA, necrotizing soft tissue infection of lower abdominal wall and upper thigh extending to perineum, septic shock. PMH includes DM, bipolar disorder, schizophrenia   4/13 I&D of groin, left inguinal crease, right medial thigh 4/15 Repeat debridement  Tolerating Vital High Protein at 20 ml/hr  No previous weight encounters; current wt 118.6 kg. Admit weight 108.6 kg. No previous weight encounters.   Nec fasc wound measuring 45 x 30 x 4 cm post I&D  Unable to obtain diet and weight history from patient  Labs: potassium 2.6 (L), phosphorus 1.8 (L), magnesium 1.7 (wdl), sodium 146 (H) Meds: potassium phosphate, KCl  NUTRITION - FOCUSED PHYSICAL EXAM:    Most Recent Value  Orbital Region  Mild depletion  Upper Arm Region  No depletion  Thoracic and Lumbar Region  No depletion  Buccal Region  Unable to assess  Temple Region  Mild depletion  Clavicle Bone Region  No depletion  Clavicle and Acromion Bone Region  No depletion  Scapular Bone Region  No depletion  Dorsal  Hand  No depletion  Patellar Region  No depletion  Anterior Thigh Region  No depletion  Posterior Calf Region  No depletion  Edema (RD Assessment)  Mild       Diet Order:   Diet Order            Diet NPO time specified  Diet effective now              EDUCATION NEEDS:   Not appropriate for education at this time  Skin:  Skin Assessment: Skin Integrity Issues: Skin Integrity Issues:: Unstageable, Other (Comment) Unstageable: buttocks Other: necrotizing wound to lower abdomial wall and upper thigh extending to perineum requiring debridments  Last BM:  4/15  Height:   Ht Readings from Last 1 Encounters:  06/23/19 5\' 7"  (1.702 m)    Weight:   Wt Readings from Last 1 Encounters:  06/26/19 118.6 kg    BMI:  Body mass index is 40.95 kg/m.  Estimated Nutritional Needs:   Kcal:  2400-2700 kcals  Protein:  120-150 g  Fluid:  >/= 2 L   06/28/19 MS, RDN, LDN, CNSC RD Pager Number and Weekend/On-Call After Hours Pager Located in [place]

## 2019-06-26 NOTE — Transfer of Care (Signed)
Immediate Anesthesia Transfer of Care Note  Patient: Kimberly Orozco  Procedure(s) Performed: IRRIGATION AND DEBRIDEMENT ABDOMINAL WALL AND GROIN (N/A Groin)  Patient Location: ICU  Anesthesia Type:General  Level of Consciousness: sedated, unresponsive and Patient remains intubated per anesthesia plan  Airway & Oxygen Therapy: Patient remains intubated per anesthesia plan and Patient placed on Ventilator (see vital sign flow sheet for setting)  Post-op Assessment: Report given to RN and Post -op Vital signs reviewed and stable  Post vital signs: Reviewed and stable  Last Vitals:  Vitals Value Taken Time  BP 124/86 06/26/19 1045  Temp 35.8 C 06/26/19 1050  Pulse 86 06/26/19 1050  Resp 10 06/26/19 1050  SpO2 96 % 06/26/19 1050  Vitals shown include unvalidated device data.  Last Pain:  Vitals:   06/26/19 0400  TempSrc: Bladder  PainSc:          Complications: No apparent anesthesia complications

## 2019-06-26 NOTE — Anesthesia Postprocedure Evaluation (Signed)
Anesthesia Post Note  Patient: Kimberly Orozco  Procedure(s) Performed: IRRIGATION AND DEBRIDEMENT ABDOMINAL WALL AND GROIN (N/A Groin)     Patient location during evaluation: PACU Anesthesia Type: General Level of consciousness: patient remains intubated per anesthesia plan Pain management: pain level controlled Vital Signs Assessment: vitals unstable Respiratory status: patient remains intubated per anesthesia plan and respiratory function unstable Cardiovascular status: tachycardic and unstable Postop Assessment: no apparent nausea or vomiting Anesthetic complications: no    Last Vitals:  Vitals:   06/26/19 0900 06/26/19 1047  BP: 98/70 118/68  Pulse: 78 86  Resp: (!) 21 18  Temp: 37.3 C   SpO2: 100% 96%    Last Pain:  Vitals:   06/26/19 0400  TempSrc: Bladder  PainSc:                  Gracelynne Benedict A.

## 2019-06-26 NOTE — Progress Notes (Signed)
NAME:  Kimberly Orozco, MRN:  [id number], DOB:  08-17-1975, LOS: 3 ADMISSION DATE:  06/23/2019, CONSULTATION DATE:  06/23/2019 REFERRING MD:  Dr. Regenia Skeeter CHIEF COMPLAINT:  AMS   Brief History   [name] is a 44 y/o female with a PMHx of T2DM, Schizophrenia, Bipolar Disorder who presents to the ED with c/o AMS and found to be in diabetic ketoacidosis complicated by necrotizing soft tissue infection.   History of present illness   [name] is a 44 y/o female with a PMHx of T2DM, Schizophrenia, Bipolar Disorder who presents to the ED with c/o AMS.   Per chart review, patient has been experiencing confusion for the past few days. This is in light of missing her diabetic (metformin and insulin) and psychiatric medications for the last 1 month. [name] denies any pain at this time. She is unaware of any rash or skin lesions, denies any trauma to her groin or abdomen.   Past Medical History  - T2DM - Schizophrenia  - Bipolar Disorder   Significant Hospital Events   4/12: Admitted to the ICU  Consults:  General Surgery  Procedures:  4/14 Central Line Placement >>   Significant Diagnostic Tests:   CT abdomen/pelvis (4/12):  1. Extensive dermal thickening and subcutaneous fat stranding within the lower anterior abdominal wall, bilateral inguinal regions, and right-side perineum consistent with infection. No fluid collection or abscess. 2. Fibroid uterus. 3. Cholelithiasis without cholecystitis.  CT Femur (4/12):  1. Dermal thickening and subcutaneous fat stranding extending from the lower anterior abdominal wall through the right inguinal region and perineum, and involving the anterior proximal right thigh. Findings are consistent with infection. No fluid collection or abscess. 2. Significant osteoarthritis of the right hip and knee. 3. Reactive lymph nodes right inguinal region.  Micro Data:  Blood cultures (4/12): Pending Urine Cultures (4/12): Pending     Antimicrobials:  Metronidazole x1 (4/12) Cefepime (4/12) >> Vancomycin (4/12) >> Clindamycin (4/12) >>  Interim history/subjective:  Sedated on vent. Return to OR this AM.  Objective   Blood pressure 118/68, pulse 86, temperature 99.1 F (37.3 C), resp. rate 18, height 5\' 7"  (1.702 m), weight 118.6 kg, SpO2 96 %. CVP:  [6 mmHg-12 mmHg] 12 mmHg  Vent Mode: PRVC FiO2 (%):  [30 %-40 %] 30 % Set Rate:  [18 bmp] 18 bmp Vt Set:  [490 mL] 490 mL PEEP:  [5 cmH20] 5 cmH20 Plateau Pressure:  [15 cmH20-21 cmH20] 21 cmH20   Intake/Output Summary (Last 24 hours) at 06/26/2019 1057 Last data filed at 06/26/2019 1031 Gross per 24 hour  Intake 6878.66 ml  Output 2225 ml  Net 4653.66 ml   Filed Weights   06/24/19 0306 06/25/19 0401 06/26/19 0500  Weight: 108.6 kg 114.6 kg 118.6 kg   Physical Exam Vitals and nursing note reviewed.  Constitutional:      Appearance: She is obese. She is ill-appearing.     Comments: Intubated and sedated  HENT:     Mouth/Throat:     Lips: Pink.     Mouth: Mucous membranes are moist.  Eyes:     Pupils: Pupils are equal, round, and reactive to light.  Cardiovascular:     Rate and Rhythm: Normal rate and regular rhythm.     Heart sounds: No murmur. No gallop.      Comments: Mechanical breath sounds Pulmonary:     Effort: Pulmonary effort is normal.     Breath sounds: No wheezing, rhonchi or rales.  Abdominal:     General: Bowel sounds are decreased.     Palpations: Abdomen is soft.     Tenderness: There is no abdominal tenderness.     Comments: S/p large debridement of skin and soft tissue 45cc x 30cc x 3-4cc (d) at right groin, inguinal region and RLQ. Packing in place.  Skin:    General: Skin is warm and dry.     Findings: Erythema present.  Neurological:     Mental Status: She is lethargic.     Comments: Sedated on MV  Psychiatric:        Behavior: Behavior is cooperative.    Resolved Hospital Problem list   Diabetic  Ketoacidosis  Assessment & Plan:   Sepsis with Septic Shock 2/2 Necrotizing Soft Tissue Infection: In the setting of uncontrolled diabetes. Fever o/n to 101.8. Euvolemic today, CVP 5-6, On Levo. - General surgery following - S/P large debridement (45cc x 30cm) on 4/13; Revision 4/25 with debridement of 3-5cm additional tissue from bilateral edges.  - Vancomycin, Cefepime, Clindamycin per pharmacy - Levophed (goal MAP >65) - Follow Fever Curve, WBC, CVP  Acute respiratory Failure, Mechanical Ventilation - Remains on Vent - PRVC 40%, PEEP 5, Rate 18 - Fentanyl gtt for pain control/sedation - Precedex gtt - VAP - Daily Wean, SBT  AKI E-lyte disturbances - Renal injury in the setting of occult shock follow by frnak shock in the setting of sepsis - AGMA and NAGMA resolved - AKI resolved, UOP improved - Monitor Renal function and E-lytes, Replete PRN - Stop Bicarb gtt  Encephalopathy Schizophrenia Bipolar: Home meds benztropine, Divalproex, Haldol, Temazepam - Presented oriented to self and hospital only - Sedated on Vent at this time - Resume Haldol, Benztropine, Valproic Acid per Tube  Diabetes: P/W DKA, now resolved - Levemir 30U BID - SSI q4h - Consider TF coverage based on response to Feeds - Place OG, begin TF  Best practice:  Diet: TFs Pain/Anxiety/Delirium protocol (if indicated): Fentanyl, Precedex gtt VAP protocol (if indicated): N/A DVT prophylaxis: Heparin GI prophylaxis: PPI Glucose control: Insulin as above Mobility: Bedrest Code Status: FULL Family Communication: Mother updated 4/14 by phone. Disposition: ICU  Labs   CBC: Recent Labs  Lab 06/23/19 1500 06/23/19 1747 06/23/19 2018 06/23/19 2018 06/24/19 0527 06/24/19 0527 06/24/19 1554 06/24/19 2220 06/25/19 0624 06/25/19 0625 06/26/19 0425  WBC 27.3*  --  23.9*  --  20.3*  --   --   --  22.2*  --  23.3*  NEUTROABS 22.4*  --   --   --   --   --   --   --   --   --   --   HGB 13.7   < > 12.9    < > 13.6   < > 11.6* 7.8* 9.2* 8.2* 7.9*  HCT 45.7   < > 40.0   < > 40.8   < > 34.0* 23.0* 28.2* 24.0* 24.3*  MCV 89.1  --  82.6  --  79.7*  --   --   --  83.4  --  80.7  PLT 314  --  269  --  211  --   --   --  245  --  209   < > = values in this interval not displayed.    Basic Metabolic Panel: Recent Labs  Lab 06/24/19 0527 06/24/19 0527 06/24/19 1443 06/24/19 1554 06/24/19 2006 06/24/19 2006 06/24/19 2220 06/25/19 0932 06/25/19 3557 06/25/19 2015 06/26/19 0425 06/26/19 3220  NA 144   < > 144   < > 145   < > 142 141 143 142  --  146*  K 2.9*   < > 3.9   < > 3.4*   < > 3.5 3.2* 3.3* 3.2*  --  2.6*  CL 117*   < > 120*  --  109  --   --  112*  --  114*  --  115*  CO2 14*   < > 14*  --  31  --   --  18*  --  20*  --  23  GLUCOSE 210*   < > 194*  --  126*  --   --  303*  --  268*  --  167*  BUN 25*   < > 24*  --  22*  --   --  29*  --  23*  --  19  CREATININE 0.78   < > 0.82  --  0.76  --   --  1.05*  --  0.74  --  0.64  CALCIUM 9.8   < > 8.9  --  6.8*  --   --  8.2*  --  7.4*  --  7.4*  MG 2.2  --   --   --   --   --   --   --   --   --  1.7  --   PHOS <1.0*  --  3.4  --   --   --   --   --   --   --  1.8*  --    < > = values in this interval not displayed.   GFR: Estimated Creatinine Clearance: 119.6 mL/min (by C-G formula based on SCr of 0.64 mg/dL). Recent Labs  Lab 06/23/19 1822 06/23/19 2018 06/23/19 2234 06/24/19 0527 06/25/19 0624 06/25/19 1846 06/26/19 0425 06/26/19 0426  WBC  --  23.9*  --  20.3* 22.2*  --  23.3*  --   LATICACIDVEN   < >  --  3.4*  --  4.1* 1.7  --  1.7   < > = values in this interval not displayed.    Liver Function Tests: Recent Labs  Lab 06/23/19 1500  AST 27  ALT 26  ALKPHOS 149*  BILITOT 1.2  PROT 7.2  ALBUMIN 1.6*   No results for input(s): LIPASE, AMYLASE in the last 168 hours. Recent Labs  Lab 06/23/19 1501  AMMONIA 72*    ABG    Component Value Date/Time   PHART 7.437 06/25/2019 0625   PCO2ART 24.5 (L)  06/25/2019 0625   PO2ART 118.0 (H) 06/25/2019 0625   HCO3 16.3 (L) 06/25/2019 0625   TCO2 17 (L) 06/25/2019 0625   ACIDBASEDEF 6.0 (H) 06/25/2019 0625   O2SAT 99.0 06/25/2019 0625     Coagulation Profile: Recent Labs  Lab 06/23/19 1500  INR 1.6*    Cardiac Enzymes: No results for input(s): CKTOTAL, CKMB, CKMBINDEX, TROPONINI in the last 168 hours.  HbA1C: Hgb A1c MFr Bld  Date/Time Value Ref Range Status  04/27/2019 02:17 PM 14.4 (H) 4.8 - 5.6 % Final    Comment:    (NOTE) Pre diabetes:          5.7%-6.4% Diabetes:              >6.4% Glycemic control for   <7.0% adults with diabetes     CBG: Recent Labs  Lab 06/25/19 1523 06/25/19 1938 06/25/19 2331 06/26/19 0340  06/26/19 0718  GLUCAP 270* 260* 242* 168* 138*    Review of Systems:   Negative, except as noted above.   Past Medical History  She,  has no past medical history on file.   Surgical History    Past Surgical History:  Procedure Laterality Date  . INCISION AND DRAINAGE PERIRECTAL ABSCESS Right 06/24/2019   Procedure: IRRIGATION AND DEBRIDEMENT RIGHT GROIN AND RIGHT BUTTOCK;  Surgeon: Axel Filler, MD;  Location: [facility] OR;  Service: General;  Laterality: Right;     Social History   reports that she has never smoked. She has never used smokeless tobacco.   Family History   Her family history is not on file.   Allergies No Known Allergies   Home Medications  Prior to Admission medications   Medication Sig Start Date End Date Taking? Authorizing Provider  acetaminophen (TYLENOL) 325 MG tablet Take 650 mg by mouth every 6 (six) hours as needed for mild pain or headache.    [provider]  benztropine (COGENTIN) 1 MG tablet Take 1 tablet (1 mg total) by mouth 2 (two) times daily. 05/02/19   Malvin Johns, MD  cloNIDine (CATAPRES) 0.2 MG tablet Take 1 tablet (0.2 mg total) by mouth 2 (two) times daily. 05/02/19   Malvin Johns, MD  divalproex (DEPAKOTE SPRINKLE) 125 MG capsule Take 2  capsules (250 mg total) by mouth 2 (two) times daily. 05/02/19   Malvin Johns, MD  haloperidol (HALDOL) 10 MG tablet 1 in am 2 at hs Patient taking differently: Take 10 mg by mouth See admin instructions. 10mg  in in the morning, and 20mg  at bedtime. 05/02/19   , MD  haloperidol decanoate (HALDOL DECANOATE) 100 MG/ML injection Inject 1 mL (100 mg total) into the muscle every 30 (thirty) days. Due 3/15 05/29/19   4/15, MD  insulin aspart (NOVOLOG) 100 UNIT/ML injection Inject 4 Units into the skin 3 (three) times daily with meals. 05/02/19   Malvin Johns, MD  metFORMIN (GLUCOPHAGE-XR) 500 MG 24 hr tablet Take 1 tablet (500 mg total) by mouth daily with supper. 05/02/19   Malvin Johns, MD  propranolol ER (INDERAL LA) 80 MG 24 hr capsule Take 1 capsule (80 mg total) by mouth daily. 05/02/19 05/01/20  05/04/19, MD  temazepam (RESTORIL) 30 MG capsule Take 1 capsule (30 mg total) by mouth at bedtime. 05/02/19   Malvin Johns, MD    05/04/19, DO IM PGY-3 Pager: [phone]

## 2019-06-26 NOTE — Progress Notes (Signed)
Patient arrived from OR being bagged by CRNA.  Placed patient back on ventilator, on full support settings, and is currently tolerating well.  Will continue to monitor.

## 2019-06-26 NOTE — Progress Notes (Signed)
Pharmacy Antibiotic Note  Kimberly Orozco is a 44 y.o. female admitted on 06/23/2019 with sepsis.  Pharmacy has been consulted for Vancomycin and Cefepime dosing. Pt febrile with Tm 101.8. WBC elevated at 23.3, lactate 3.4.Scr 1.05>0.64. Patient with severe cellulitis of R leg tracking up to abdomen. S/p 2nd I&D on 4/15. Patient also on clindamycin for possible necrotizing fascitis.   Plan: - Adjusted Vancomycin to 1000 mg IV Q12H (estUC 439, Scr 0.8, Vd 0.5) - Continue Cefepime to 2g IV Q8H  - Monitor renal function, cultures/sensitivities, clinical progression - Check vancomycin levels as indicated  Height: 5\' 7"  (170.2 cm) Weight: 118.6 kg (261 lb 7.5 oz) IBW/kg (Calculated) : 61.6  Temp (24hrs), Avg:99.3 F (37.4 C), Min:98.8 F (37.1 C), Max:100.2 F (37.9 C)  Recent Labs  Lab 06/23/19 1500 06/23/19 1700 06/23/19 1822 06/23/19 2018 06/23/19 2018 06/23/19 2234 06/24/19 0135 06/24/19 0527 06/24/19 0527 06/24/19 1443 06/24/19 2006 06/25/19 0624 06/25/19 1846 06/25/19 2015 06/26/19 0425 06/26/19 0426 06/26/19 0429  WBC 27.3*  --   --  23.9*  --   --   --  20.3*  --   --   --  22.2*  --   --  23.3*  --   --   CREATININE 1.91*   < >  --  1.23*   < > 0.98   < > 0.78   < > 0.82 0.76 1.05*  --  0.74  --   --  0.64  LATICACIDVEN 4.1*   < > 3.9*  --   --  3.4*  --   --   --   --   --  4.1* 1.7  --   --  1.7  --    < > = values in this interval not displayed.    Estimated Creatinine Clearance: 119.6 mL/min (by C-G formula based on SCr of 0.64 mg/dL).    No Known Allergies  Antimicrobials this admission: Vanc 4/12 >> Cefepime 4/12>> Clindamycin 4/12>> Flagyl 4/12 x1  Dose adjustments this admission: N/A  Microbiology results: 4/13 Surg cx: gram +/- rods (on abx) 4/12 UCx - multiple species 4/12 MRSA PCR - positive 4/12 Staph Aur - positive 4/12 Covid - negative 4/12 Influenza - negative  6/12, PharmD PGY1 Ambulatory Care Resident [employer] #  [phone]  Please check [email or web address] for unit-specific pharmacy phone numbers.

## 2019-06-26 NOTE — Anesthesia Procedure Notes (Signed)
Arterial Line Insertion Start/End4/15/2021 10:20 AM, 06/26/2019 10:25 AM Performed by: Mal Amabile, MD, anesthesiologist  Patient location: OR. Preanesthetic checklist: patient identified, IV checked, site marked, surgical consent, monitors and equipment checked, pre-op evaluation, timeout performed and anesthesia consent Patient sedated Left, radial was placed Catheter size: 20 G Hand hygiene performed  and maximum sterile barriers used  Allen's test indicative of satisfactory collateral circulation Attempts: 1 Procedure performed without using ultrasound guided technique. Following insertion, dressing applied and Biopatch. Post procedure assessment: normal  Patient tolerated the procedure well with no immediate complications.

## 2019-06-26 NOTE — Op Note (Signed)
06/26/2019  10:25 AM  PATIENT:  Kimberly Orozco  44 y.o. female  PRE-OPERATIVE DIAGNOSIS:  NECROTIZING FASCIITIS ABDOMINAL WALL AND GROIN  POST-OPERATIVE DIAGNOSIS:  NECROTIZING FASCIITIS ABDOMINAL WALL AND GROIN  PROCEDURE:  Procedure(s): IRRIGATION AND DEBRIDEMENT ABDOMINAL WALL AND GROIN (N/A)  SURGEON:  Surgeon(s) and Role:    * Axel Filler, MD - Primary  ANESTHESIA:   local and general  EBL:  25cc   BLOOD ADMINISTERED:none  DRAINS: none   LOCAL MEDICATIONS USED:  NONE  SPECIMEN:  No Specimen  DISPOSITION OF SPECIMEN:  N/A  COUNTS:  YES  TOURNIQUET:  * No tourniquets in log *  DICTATION: .Dragon Dictation  Details of procedure: After the patient was consented she was taken back to the operating room and placed in the supine position.  Patient underwent general endotracheal intubation.  Patient was placed in lithotomy position.  Patient was then prepped and draped in sterile fashion.  A timeout was called all facts verified.  The Betadine soaked Kerlix was extracted.  The right and left lateral edges appear to be the most necrotic.  This was debrided sharply with curved Mayo scissors.  There was approximately 4 to 5 cm that were debrided proximal to this area.  This was on the bilateral edges.  The area of her right inferior thigh was also debrided as there was continued necrotic tissue.  This was approximately another 3 to 4 cm inferiorly.  The rest of the bed of the wound appeared to be viable and there was no necrosis.  At this time the area was irrigated out with a pulse lavage.  Betadine soaked Kerlix x5 was placed into the wound.  The area was dressed the patient was taken to the ICU intubated and in critical condition.   Excisional debridement:  Tool used for debridement (curette, scapel, etc.) Bovie cautery, Mayo scissors  Frequency of surgical debridement.   Second debridement  Measurement of total devitalized tissue (wound surface) before and  after surgical debridement.   45 x 30 cm--> 45 x 30 cm  Area and depth of devitalized tissue removed from wound.  45 x 30 x 4 cm  Blood loss and description of tissue removed.  EBL 25 cc, necrotic tissue removed  Evidence of the progress of the wound's response to treatment.  A.  Current wound volume (current dimensions and depth).  25 x 30 x 4 cm  B.  Presence (and extent of) of infection.  Yes  C.  Presence (and extent of) of non viable tissue.  Yes  D.  Other material in the wound that is expected to inhibit healing.  None  Was there any viable tissue removed (measurements): No   PLAN OF CARE: admitted to ICU  PATIENT DISPOSITION:  ICU - intubated and critically ill.   Delay start of Pharmacological VTE agent (>24hrs) due to surgical blood loss or risk of bleeding: yes

## 2019-06-26 NOTE — Anesthesia Preprocedure Evaluation (Addendum)
Anesthesia Evaluation  Patient identified by MRN, date of birth, ID band Patient unresponsive  General Assessment Comment:Intubated on ventilator- sedated with Precedex and Fentanyl On Levophed drip  Reviewed: Allergy & Precautions, NPO status , Patient's Chart, lab work & pertinent test results, reviewed documented beta blocker date and time , Unable to perform ROS - Chart review only  Airway Mallampati: Intubated       Dental no notable dental hx. (+) Teeth Intact   Pulmonary neg pulmonary ROS,  Acute Respiratory Failure 4/13- intubated on ventilator   Pulmonary exam normal breath sounds clear to auscultation       Cardiovascular hypertension, Pt. on medications and Pt. on home beta blockers Normal cardiovascular exam Rhythm:Regular Rate:Normal     Neuro/Psych Bipolar Disorder Schizophrenia negative neurological ROS  negative psych ROS   GI/Hepatic negative GI ROS, Neg liver ROS,   Endo/Other  diabetes, Poorly Controlled, Type 2, Insulin Dependent, Oral Hypoglycemic AgentsMorbid obesityDKA on admission  Renal/GU Renal hypertensionRenal diseaseHypokalemia  negative genitourinary   Musculoskeletal Necrotizing fasciitis abdominal wall and groin   Abdominal (+) + obese,   Peds  Hematology  (+) anemia ,   Anesthesia Other Findings Necrotizing Fascitis  Reproductive/Obstetrics negative OB ROS                            Anesthesia Physical  Anesthesia Plan  ASA: III  Anesthesia Plan: General   Post-op Pain Management:    Induction: Intravenous and Inhalational  PONV Risk Score and Plan: 3 and Ondansetron, Dexamethasone, Midazolam and Treatment may vary due to age or medical condition  Airway Management Planned: Oral ETT  Additional Equipment:   Intra-op Plan:   Post-operative Plan: Post-operative intubation/ventilation  Informed Consent: I have reviewed the patients History and  Physical, chart, labs and discussed the procedure including the risks, benefits and alternatives for the proposed anesthesia with the patient or authorized representative who has indicated his/her understanding and acceptance.     Dental advisory given  Plan Discussed with: CRNA, Surgeon and Anesthesiologist  Anesthesia Plan Comments:         Anesthesia Quick Evaluation

## 2019-06-27 LAB — CBC
HCT: 23.8 % — ABNORMAL LOW (ref 36.0–46.0)
Hemoglobin: 7.7 g/dL — ABNORMAL LOW (ref 12.0–15.0)
MCH: 26.3 pg (ref 26.0–34.0)
MCHC: 32.4 g/dL (ref 30.0–36.0)
MCV: 81.2 fL (ref 80.0–100.0)
Platelets: 180 10*3/uL (ref 150–400)
RBC: 2.93 MIL/uL — ABNORMAL LOW (ref 3.87–5.11)
RDW: 15.3 % (ref 11.5–15.5)
WBC: 25.4 10*3/uL — ABNORMAL HIGH (ref 4.0–10.5)
nRBC: 2.2 % — ABNORMAL HIGH (ref 0.0–0.2)

## 2019-06-27 LAB — BASIC METABOLIC PANEL
Anion gap: 10 (ref 5–15)
Anion gap: 8 (ref 5–15)
BUN: 15 mg/dL (ref 6–20)
BUN: 16 mg/dL (ref 6–20)
CO2: 23 mmol/L (ref 22–32)
CO2: 23 mmol/L (ref 22–32)
Calcium: 7.1 mg/dL — ABNORMAL LOW (ref 8.9–10.3)
Calcium: 6.9 mg/dL — ABNORMAL LOW (ref 8.9–10.3)
Chloride: 110 mmol/L (ref 98–111)
Chloride: 110 mmol/L (ref 98–111)
Creatinine, Ser: 0.59 mg/dL (ref 0.44–1.00)
Creatinine, Ser: 0.55 mg/dL (ref 0.44–1.00)
GFR calc Af Amer: >60 mL/min (ref >60)
GFR calc Af Amer: >60 mL/min (ref >60)
GFR calc non Af Amer: >60 mL/min (ref >60)
GFR calc non Af Amer: >60 mL/min (ref >60)
Glucose, Bld: 188 mg/dL — ABNORMAL HIGH (ref 70–99)
Glucose, Bld: 110 mg/dL — ABNORMAL HIGH (ref 70–99)
Potassium: 3.1 mmol/L — ABNORMAL LOW (ref 3.5–5.1)
Potassium: 3.3 mmol/L — ABNORMAL LOW (ref 3.5–5.1)
Sodium: 143 mmol/L (ref 135–145)
Sodium: 141 mmol/L (ref 135–145)

## 2019-06-27 LAB — PHOSPHORUS: Phosphorus: 2.8 mg/dL (ref 2.5–4.6)

## 2019-06-27 LAB — GLUCOSE, CAPILLARY
Glucose-Capillary: 172 mg/dL — ABNORMAL HIGH (ref 70–99)
Glucose-Capillary: 192 mg/dL — ABNORMAL HIGH (ref 70–99)
Glucose-Capillary: 193 mg/dL — ABNORMAL HIGH (ref 70–99)
Glucose-Capillary: 107 mg/dL — ABNORMAL HIGH (ref 70–99)
Glucose-Capillary: 106 mg/dL — ABNORMAL HIGH (ref 70–99)

## 2019-06-27 LAB — MAGNESIUM: Magnesium: 1.6 mg/dL — ABNORMAL LOW (ref 1.7–2.4)

## 2019-06-27 MED ORDER — NOREPINEPHRINE 16 MG/250ML-% IV SOLN
0.0000 ug/min | INTRAVENOUS | Status: DC
  Administered 2019-06-27: 25 ug/min via INTRAVENOUS
  Filled 2019-06-27: qty 250

## 2019-06-27 MED ORDER — PRO-STAT SUGAR FREE PO LIQD
60.0000 mL | Freq: Three times a day (TID) | ORAL | Status: DC
  Administered 2019-06-27 – 2019-06-30 (×9): 60 mL via ORAL
  Filled 2019-06-27 (×9): qty 60

## 2019-06-27 MED ORDER — PIVOT 1.5 CAL PO LIQD
1000.0000 mL | ORAL | Status: DC
  Filled 2019-06-27 (×2): qty 1000

## 2019-06-27 MED ORDER — OXYCODONE HCL 5 MG PO TABS
5.0000 mg | ORAL_TABLET | Freq: Four times a day (QID) | ORAL | Status: DC | PRN
  Administered 2019-06-27 – 2019-06-28 (×3): 5 mg
  Filled 2019-06-27 (×3): qty 1

## 2019-06-27 MED ORDER — VASOPRESSIN 20 UNIT/ML IV SOLN: 0.0300 [IU]/min | INTRAVENOUS | Status: DC

## 2019-06-27 MED ORDER — POTASSIUM CHLORIDE 20 MEQ/15ML (10%) PO SOLN
30.0000 meq | ORAL | Status: AC
  Administered 2019-06-27 (×2): 30 meq
  Filled 2019-06-27 (×2): qty 30

## 2019-06-27 MED ORDER — MAGNESIUM SULFATE 2 GM/50ML IV SOLN
2.0000 g | Freq: Once | INTRAVENOUS | Status: AC
  Administered 2019-06-27: 2 g via INTRAVENOUS
  Filled 2019-06-27: qty 50

## 2019-06-27 MED ORDER — ENOXAPARIN SODIUM 40 MG/0.4ML ~~LOC~~ SOLN
40.0000 mg | SUBCUTANEOUS | Status: DC
  Administered 2019-06-27 – 2019-07-07 (×10): 40 mg via SUBCUTANEOUS
  Filled 2019-06-27 (×11): qty 0.4

## 2019-06-27 MED ORDER — FENTANYL CITRATE (PF) 100 MCG/2ML IJ SOLN
25.0000 ug | INTRAMUSCULAR | Status: DC | PRN
  Administered 2019-06-27: 50 ug via INTRAVENOUS
  Administered 2019-06-27: 100 ug via INTRAVENOUS
  Administered 2019-06-27: 75 ug via INTRAVENOUS
  Administered 2019-06-28: 50 ug via INTRAVENOUS
  Administered 2019-06-28 – 2019-06-30 (×9): 100 ug via INTRAVENOUS
  Administered 2019-06-30: 25 ug via INTRAVENOUS
  Administered 2019-06-30: 100 ug via INTRAVENOUS
  Administered 2019-07-01: 50 ug via INTRAVENOUS
  Administered 2019-07-02 (×3): 100 ug via INTRAVENOUS
  Filled 2019-06-27 (×20): qty 2

## 2019-06-27 NOTE — Procedures (Signed)
Cortrak  Person Inserting Tube:  Kimberly Orozco, RD Tube Type:  Cortrak - 43 inches Tube Location:  Left nare Initial Placement:  Stomach Secured by: Bridle Technique Used to Measure Tube Placement:  Documented cm marking at nare/ corner of mouth Cortrak Secured At:  72 cm   No x-ray is required. RN may begin using tube.   If the tube becomes dislodged please keep the tube and contact the Cortrak team at [email or web address] ([email or web address]) for replacement.  If after hours and replacement cannot be delayed, place a NG tube and confirm placement with an abdominal x-ray.    Kimberly Orozco RD, LDN Clinical Nutrition Pager listed in AMION    

## 2019-06-27 NOTE — Progress Notes (Addendum)
PULMONARY / CRITICAL CARE MEDICINE   NAME:  Kimberly Orozco, MRN:  [id number], DOB:  September 09, 1975, LOS: 4 ADMISSION DATE:  06/23/2019, CONSULTATION DATE:  06/23/19 REFERRING MD:  Dr. Criss Alvine, CHIEF COMPLAINT:  AMS  BRIEF HISTORY:    [name] is a 44 y/o female with a PMHx of T2DM, Schizophrenia, Bipolar Disorder who presents to the ED with c/o AMS and found to be in diabetic ketoacidosis complicated by necrotizing soft tissue infection.  SIGNIFICANT PAST MEDICAL HISTORY   - T2DM - Schizophrenia  - Bipolar Disorder   SIGNIFICANT EVENTS:  4/12: Admitted to the ICU  STUDIES:   CT abdomen/pelvis (4/12):  1. Extensive dermal thickening and subcutaneous fat stranding within the lower anterior abdominal wall, bilateral inguinal regions, and right-side perineum consistent with infection. No fluid collection or abscess. 2. Fibroid uterus. 3. Cholelithiasis without cholecystitis.  CT Femur (4/12):  1. Dermal thickening and subcutaneous fat stranding extending from the lower anterior abdominal wall through the right inguinal region and perineum, and involving the anterior proximal right thigh. Findings are consistent with infection. No fluid collection or abscess. 2. Significant osteoarthritis of the right hip and knee. 3. Reactive lymph nodes right inguinal region. CULTURES:  Blood cultures (4/12): no growth at 3 days Urine Cultures (4/12): multiple species  ANTIBIOTICS:  Metronidazole x1 (4/12) Cefepime (4/12) >> Vancomycin (4/12) >> Clindamycin (4/12) >> LINES/TUBES:  4/14 Central Line Placement >>  CONSULTANTS:  General Surgery SUBJECTIVE:  Patient is resting comfortable in bed. Bandages are recently replaced by Gen surg.   CONSTITUTIONAL: BP (!) 112/93   Pulse 84   Temp 99.5 F (37.5 C)   Resp (!) 21   Ht 5\' 7"  (1.702 m)   Wt 124 kg   SpO2 100%   BMI 42.82 kg/m   I/O last 3 completed shifts: In: 7779.6 [I.V.:4855.5; NG/GT:993.5; IV Piggyback:1930.6] Out:  2655 [Urine:2625; Blood:30]  CVP:  [3 mmHg-8 mmHg] 8 mmHg  Vent Mode: CPAP;PSV FiO2 (%):  [30 %] 30 % Set Rate:  [18 bmp] 18 bmp Vt Set:  [490 mL] 490 mL PEEP:  [5 cmH20] 5 cmH20 Pressure Support:  [10 cmH20] 10 cmH20 Plateau Pressure:  [14 cmH20-20 cmH20] 17 cmH20  PHYSICAL EXAM: Physical Exam Vitals and nursing note reviewed.  Constitutional:      Appearance: She is obese. She is ill-appearing.     Comments: Intubated and sedated  HENT:     Mouth/Throat:     Lips: Pink.     Mouth: Mucous membranes are moist.  Eyes:     Pupils: Pupils are equal, round, and reactive to light.  Cardiovascular:     Rate and Rhythm: Normal rate and regular rhythm.     Heart sounds: No murmur. No gallop.      Comments: Mechanical breath sounds Pulmonary:     Effort: Pulmonary effort is normal.     Breath sounds: No wheezing, rhonchi or rales.  Abdominal:     General: Bowel sounds are decreased.     Palpations: Abdomen is soft.     Tenderness: There is no abdominal tenderness.     Comments: S/p large debridement of skin and soft tissue 45cc x 30cc x 3-4cc (d) at right groin, inguinal region and RLQ. Packing in place.  Skin:    General: Skin is warm and dry.     Findings: Erythema present.  Neurological:     Mental Status: She is lethargic.     Comments: Sedated on MV  Psychiatric:  Behavior: Behavior is cooperative  RESOLVED PROBLEM LIST  DKA  ASSESSMENT AND PLAN   Sepsis with Septic Shock 2/2 Necrotizing Soft Tissue Infection: In the setting of uncontrolled diabetes. Temperature up to 99.3. Euvolemic today, WBC of 25.4 - General surgery following, do not intent to take her back to back to OR - S/P large debridement (45cc x 30cm) on 4/13; Revision 4/25 with debridement of 3-5cm additional tissue from bilateral edges.  - Vancomycin, Cefepime per pharmacy - Levophed (goal MAP >65) - Follow Fever Curve, WBC, CVP - Urine culture - multiple species   Acute respiratory Failure,  Mechanical Ventilation - PRVC 40%, PEEP 5, Rate 18=> PS with 10/5 for SBT - Fentanyl gtt for pain control/sedation - Precedex gtt wean today for SBT  - VAP - Daily Wean, SBT  AKI E-lyte disturbances - Renal injury in the setting of occult shock follow by frnak shock in the setting of sepsis - AGMA and NAGMA resolved - AKI resolved, UOP improved - Monitor Renal function and E-lytes, Replete PRN  Encephalopathy Schizophrenia Bipolar: Home meds benztropine, Divalproex, Haldol, Temazepam - Presented oriented to self and hospital only - Resume Haldol, Benztropine, Valproic Acid per Tube  Diabetes: P/W DKA, now resolved - Levemir 30U BID - SSI q4h  SUMMARY OF TODAY'S PLAN:  Try to wean sedation, pain control with fentanyl. SBT today.   Best Practice / Goals of Care / Disposition.   Diet: TFs Pain/Anxiety/Delirium protocol (if indicated): Fentanyl VAP protocol (if indicated): N/A DVT prophylaxis: Heparin GI prophylaxis: PPI Glucose control: Insulin as above Mobility: Bedrest Code Status: FULL Family Communication: Mother updated 4/14 by phone. Disposition: ICU  LABS  Glucose Recent Labs  Lab 06/26/19 1148 06/26/19 1607 06/26/19 2008 06/26/19 2347 06/27/19 0416 06/27/19 0750  GLUCAP 130* 188* 248* 208* 172* 192*    BMET Recent Labs  Lab 06/26/19 0429 06/26/19 1952 06/27/19 0413  NA 146* 144 143  K 2.6* 3.9 3.1*  CL 115* 111 110  CO2 23 23 23   BUN 19 18 15   CREATININE 0.64 0.61 0.59  GLUCOSE 167* 231* 188*    Liver Enzymes Recent Labs  Lab 06/23/19 1500  AST 27  ALT 26  ALKPHOS 149*  BILITOT 1.2  ALBUMIN 1.6*    Electrolytes Recent Labs  Lab 06/24/19 0527 06/24/19 0527 06/24/19 1443 06/24/19 2006 06/25/19 2015 06/26/19 0425 06/26/19 0429 06/26/19 1952 06/27/19 0413  CALCIUM 9.8   < > 8.9   < >   < >  --  7.4* 7.0* 7.1*  MG 2.2  --   --   --   --  1.7  --   --  1.6*  PHOS <1.0*   < > 3.4  --   --  1.8*  --   --  2.8   < > = values  in this interval not displayed.    CBC Recent Labs  Lab 06/25/19 0624 06/25/19 0624 06/25/19 0625 06/26/19 0425 06/27/19 0413  WBC 22.2*  --   --  23.3* 25.4*  HGB 9.2*   < > 8.2* 7.9* 7.7*  HCT 28.2*   < > 24.0* 24.3* 23.8*  PLT 245  --   --  209 180   < > = values in this interval not displayed.    ABG Recent Labs  Lab 06/24/19 1554 06/24/19 2220 06/25/19 0625  PHART 7.342* 7.358 7.437  PCO2ART 32.6 23.4* 24.5*  PO2ART 97.0 113.0* 118.0*    Coag's Recent Labs  Lab 06/23/19 1500  APTT 26  INR 1.6*    Sepsis Markers Recent Labs  Lab 06/25/19 0624 06/25/19 1846 06/26/19 0426  LATICACIDVEN 4.1* 1.7 1.7    Cardiac Enzymes No results for input(s): TROPONINI, PROBNP in the last 168 hours.  PAST MEDICAL HISTORY :   She  has no past medical history on file.  PAST SURGICAL HISTORY:  She  has a past surgical history that includes Incision and drainage perirectal abscess (Right, 06/24/2019) and Incision and drainage of wound (N/A, 06/26/2019).  No Known Allergies  No current facility-administered medications on file prior to encounter.   Current Outpatient Medications on File Prior to Encounter  Medication Sig  . acetaminophen (TYLENOL) 325 MG tablet Take 650 mg by mouth every 6 (six) hours as needed for mild pain or headache.  . benztropine (COGENTIN) 1 MG tablet Take 1 tablet (1 mg total) by mouth 2 (two) times daily.  . cloNIDine (CATAPRES) 0.2 MG tablet Take 1 tablet (0.2 mg total) by mouth 2 (two) times daily.  . divalproex (DEPAKOTE SPRINKLE) 125 MG capsule Take 2 capsules (250 mg total) by mouth 2 (two) times daily.  . divalproex (DEPAKOTE) 250 MG DR tablet Take 250 mg by mouth daily.  . haloperidol (HALDOL) 10 MG tablet 1 in am 2 at hs (Patient taking differently: Take 10 mg by mouth See admin instructions. 10mg  in in the morning, and 20mg  at bedtime.)  . haloperidol decanoate (HALDOL DECANOATE) 100 MG/ML injection Inject 1 mL (100 mg total) into the  muscle every 30 (thirty) days. Due 3/15  . insulin aspart (NOVOLOG) 100 UNIT/ML injection Inject 4 Units into the skin 3 (three) times daily with meals.  . metFORMIN (GLUCOPHAGE-XR) 500 MG 24 hr tablet Take 1 tablet (500 mg total) by mouth daily with supper.  . propranolol ER (INDERAL LA) 80 MG 24 hr capsule Take 1 capsule (80 mg total) by mouth daily.  . temazepam (RESTORIL) 30 MG capsule Take 1 capsule (30 mg total) by mouth at bedtime.    FAMILY HISTORY:   Her family history is not on file.  SOCIAL HISTORY:  She  reports that she has never smoked. She has never used smokeless tobacco.  REVIEW OF SYSTEMS:    Negative except what is noted on subjective.

## 2019-06-27 NOTE — Progress Notes (Signed)
[facility] ADULT ICU REPLACEMENT PROTOCOL FOR AM LAB REPLACEMENT ONLY  The patient does apply for the [facility] Adult ICU Electrolyte Replacment Protocol based on the criteria listed below:   1. Is GFR >/= 40 ml/min? Yes.    Patient's GFR today is >60 2. Is urine output >/= 0.5 ml/kg/hr for the last 6 hours? Yes.   Patient's UOP is 0.6 ml/kg/hr 3. Is BUN < 60 mg/dL? Yes.    Patient's BUN today is 15 4. Abnormal electrolyte(s): K+ 3.1, Mg 1.6 5. Ordered repletion with: Protocol Replacement Central Line  6. If a panic level lab has been reported, has the CCM MD in charge been notified? Yes.  .   Physician:  Champ Mungo 06/27/2019 5:48 AM

## 2019-06-27 NOTE — Progress Notes (Signed)
1 Day Post-Op   Subjective/Chief Complaint: Pt with con't pressor req. No BMs   Objective: Vital signs in last 24 hours: Temp:  [95.9 F (35.5 C)-99.5 F (37.5 C)] 99.3 F (37.4 C) (04/16 0700) Pulse Rate:  [75-89] 81 (04/16 0700) Resp:  [10-26] 21 (04/16 0700) BP: (73-131)/(44-89) 128/76 (04/16 0700) SpO2:  [96 %-100 %] 100 % (04/16 0700) Arterial Line BP: (55-162)/(30-97) 123/53 (04/16 0700) FiO2 (%):  [30 %] 30 % (04/16 0400) Weight:  [944 kg] 124 kg (04/16 0419) Last BM Date: (PTA)  Intake/Output from previous day: 04/15 0701 - 04/16 0700 In: 5380.4 [I.V.:2927.5; NG/GT:893.5; IV Piggyback:1559.5] Out: 9675 [Urine:1385; Blood:30] Intake/Output this shift: No intake/output data recorded.  Wound:  C/d/i with no necrotic areas, kerlix in packing Abd: hypoactive BS, nttp  Lab Results:  Recent Labs    06/26/19 0425 06/27/19 0413  WBC 23.3* 25.4*  HGB 7.9* 7.7*  HCT 24.3* 23.8*  PLT 209 180   BMET Recent Labs    06/26/19 1952 06/27/19 0413  NA 144 143  K 3.9 3.1*  CL 111 110  CO2 23 23  GLUCOSE 231* 188*  BUN 18 15  CREATININE 0.61 0.59  CALCIUM 7.0* 7.1*   PT/INR No results for input(s): LABPROT, INR in the last 72 hours. ABG Recent Labs    06/24/19 2220 06/25/19 0625  PHART 7.358 7.437  HCO3 13.2* 16.3*    Studies/Results: DG CHEST PORT 1 VIEW  Result Date: 06/25/2019 CLINICAL DATA:  Central line, OG tube placement EXAM: PORTABLE CHEST 1 VIEW COMPARISON:  06/24/2019 FINDINGS: Interval placement of right neck vascular catheter, tip projecting near the superior cavoatrial junction. Interval placement of esophagogastric tube, tip and side port below the diaphragm. Endotracheal tube remains in unchanged position, tip below the thoracic inlet. No acute appearing airspace opacity. IMPRESSION: 1. Interval placement of right neck vascular catheter, tip projecting near the superior cavoatrial junction. 2. Interval placement of esophagogastric tube, tip and  side port below the diaphragm. Electronically Signed   By: Eddie Candle M.D.   On: 06/25/2019 10:50   DG Abd Portable 1V  Result Date: 06/25/2019 CLINICAL DATA:  Evaluate OG tube placement. EXAM: PORTABLE ABDOMEN - 1 VIEW COMPARISON:  CT AP 06/23/2019 FINDINGS: There is a nasogastric tube with tip projecting over the right upper quadrant of the abdomen in the expected location of the proximal duodenum. No abnormal bowel distension identified IMPRESSION: The tip of the nasogastric tube is in the expected location of the proximal duodenum. Electronically Signed   By: Kerby Moors M.D.   On: 06/25/2019 10:38    Anti-infectives: Anti-infectives (From admission, onward)   Start     Dose/Rate Route Frequency Ordered Stop   06/26/19 1200  Vancomycin (VANCOCIN) 1,500 mg in sodium chloride 0.9 % 500 mL IVPB  Status:  Discontinued     1,500 mg 250 mL/hr over 120 Minutes Intravenous Every 24 hours 06/25/19 1153 06/26/19 0455   06/26/19 1200  vancomycin (VANCOREADY) IVPB 1500 mg/300 mL  Status:  Discontinued     1,500 mg 150 mL/hr over 120 Minutes Intravenous Every 24 hours 06/26/19 0455 06/26/19 0730   06/26/19 0830  vancomycin (VANCOREADY) IVPB 1500 mg/300 mL  Status:  Discontinued     1,000 mg 100 mL/hr over 120 Minutes Intravenous Every 12 hours 06/26/19 0730 06/26/19 0741   06/26/19 0830  vancomycin (VANCOCIN) IVPB 1000 mg/200 mL premix     1,000 mg 200 mL/hr over 60 Minutes Intravenous Every 12 hours 06/26/19  9767     06/25/19 1145  vancomycin (VANCOCIN) 500 mg in sodium chloride 0.9 % 100 mL IVPB     500 mg 100 mL/hr over 60 Minutes Intravenous NOW 06/25/19 1055 06/25/19 1241   06/24/19 1600  vancomycin (VANCOREADY) IVPB 750 mg/150 mL  Status:  Discontinued     750 mg 150 mL/hr over 60 Minutes Intravenous Every 12 hours 06/23/19 1700 06/24/19 0714   06/24/19 1600  vancomycin (VANCOCIN) IVPB 1000 mg/200 mL premix  Status:  Discontinued     1,000 mg 200 mL/hr over 60 Minutes Intravenous  Every 12 hours 06/24/19 0714 06/24/19 0750   06/24/19 1400  clindamycin (CLEOCIN) IVPB 900 mg     900 mg 100 mL/hr over 30 Minutes Intravenous Every 8 hours 06/24/19 0756     06/24/19 1145  ceFEPIme (MAXIPIME) 2 g in sodium chloride 0.9 % 100 mL IVPB  Status:  Discontinued     2 g 200 mL/hr over 30 Minutes Intravenous To ShortStay Surgical 06/24/19 1131 06/24/19 1457   06/24/19 1030  ceFEPIme (MAXIPIME) 2 g in sodium chloride 0.9 % 100 mL IVPB     2 g 200 mL/hr over 30 Minutes Intravenous Every 8 hours 06/24/19 0714     06/24/19 0800  vancomycin (VANCOCIN) IVPB 1000 mg/200 mL premix  Status:  Discontinued     1,000 mg 200 mL/hr over 60 Minutes Intravenous Every 12 hours 06/24/19 0750 06/25/19 1153   06/24/19 0300  ceFEPIme (MAXIPIME) 2 g in sodium chloride 0.9 % 100 mL IVPB  Status:  Discontinued     2 g 200 mL/hr over 30 Minutes Intravenous Every 12 hours 06/23/19 1700 06/24/19 0714   06/23/19 1700  clindamycin (CLEOCIN) IVPB 600 mg  Status:  Discontinued     600 mg 100 mL/hr over 30 Minutes Intravenous Every 8 hours 06/23/19 1649 06/24/19 0756   06/23/19 1500  vancomycin (VANCOREADY) IVPB 2000 mg/400 mL     2,000 mg 200 mL/hr over 120 Minutes Intravenous  Once 06/23/19 1454 06/23/19 1747   06/23/19 1445  ceFEPIme (MAXIPIME) 2 g in sodium chloride 0.9 % 100 mL IVPB     2 g 200 mL/hr over 30 Minutes Intravenous  Once 06/23/19 1434 06/23/19 1557   06/23/19 1445  metroNIDAZOLE (FLAGYL) IVPB 500 mg     500 mg 100 mL/hr over 60 Minutes Intravenous  Once 06/23/19 1434 06/23/19 1608   06/23/19 1445  vancomycin (VANCOCIN) IVPB 1000 mg/200 mL premix  Status:  Discontinued     1,000 mg 200 mL/hr over 60 Minutes Intravenous  Once 06/23/19 1434 06/23/19 1440   06/23/19 1445  vancomycin (VANCOCIN) 2,000 mg in sodium chloride 0.9 % 500 mL IVPB  Status:  Discontinued     2,000 mg 250 mL/hr over 120 Minutes Intravenous  Once 06/23/19 1440 06/23/19 1454      Assessment/Plan: T2DMwith DKA -  CBGsin the 100-200sthis AM Acute renal failure - Cr stablizing, UOP adequate, continue IVF Hypokalemia - K 3.1 this AM, replacement per CCM Schizophrenia Bipolar disorder ABL anemia - hgb 7.7 this AM, drainage from wound not significantly bloody, continue to monitor  Septic shock - on neo Lactic acidosis   NSTI of R abd wall and groin S/P I&D 45 x 30 x 4 cm 06/24/19 Dr. Derrell Lolling - afebrile overnight - Patient remains citically ill but wound appears stable with no further area of expanding necrosis - cxs pending, continue broad spectrum abx - tBS dressing changes BID   FEN: NPO,  IVF; would minimize TF as pt with high pressor support VTE: SCDs, SQ heparin  ID: flagyl 4/12; vanc 4/12>>, cefepime 4/12>>, clinda 4/13>>   LOS: 4 days    Axel Filler 06/27/2019

## 2019-06-27 NOTE — Progress Notes (Signed)
Brief Nutrition Note:   Pt remains on vent support, possible extubation today. No plans to return to OR at this time; wound stable with no further area of expanding necrosis  Tolerating Pivot 1.5 at 40 ml/hr this AM but TF rate turned down to 15 ml/hr by MD due to "high pressor requirements."  Cortrak placed today and to remain in place post-extubation  Labs and meds reviewed  Intervention:  Continue Cortrak post extubation; pt will very likely need supplemental TF to meet high nutrition needs for extensive wound healing.   Continue Pivot 1.5 at trickle rate per MD for now; add Pro-Stat 60 mL TID to meet protein needs (provides 124 g of protein, 1140 kcals)  Vitamin C and Zinc levels pending; continue Vit C and Zinc supplementation  Follow-up post diet advancement with further recommendations regarding nutrition  Romelle Starcher MS, RDN, LDN, CNSC RD Pager Number and Weekend/On-Call After Hours Pager Located in [place]

## 2019-06-27 NOTE — Procedures (Signed)
Extubation Procedure Note  Patient Details:   Name: Resa Rinks DOB: 06-09-1975 MRN: [id number]   Airway Documentation:  Airway (Active)  Secured at (cm) 21 cm 06/27/19 0800  Measured From Lips 06/27/19 0800  Secured Location Left 06/27/19 0758  Secured By [employer] 06/27/19 0800  Tube Holder Repositioned Yes 06/27/19 0758  Cuff Pressure (cm H2O) 26 cm H2O 06/27/19 0758  Site Condition Dry 06/27/19 0800   Vent end date: (not recorded) Vent end time: (not recorded)   Evaluation  O2 sats: stable throughout Complications: No apparent complications Patient did tolerate procedure well. Bilateral Breath Sounds: Clear, Diminished   Yes, patient about to vocalize.  Patient placed on 4L nasal cannula.  Farris Has 06/27/2019, 11:25 AM

## 2019-06-28 LAB — CULTURE, BLOOD (ROUTINE X 2)
Culture: NO GROWTH
Culture: NO GROWTH

## 2019-06-28 LAB — BASIC METABOLIC PANEL
Anion gap: 6 (ref 5–15)
Anion gap: 4 — ABNORMAL LOW (ref 5–15)
BUN: 16 mg/dL (ref 6–20)
BUN: 21 mg/dL — ABNORMAL HIGH (ref 6–20)
CO2: 25 mmol/L (ref 22–32)
CO2: 25 mmol/L (ref 22–32)
Calcium: 7 mg/dL — ABNORMAL LOW (ref 8.9–10.3)
Calcium: 6.9 mg/dL — ABNORMAL LOW (ref 8.9–10.3)
Chloride: 116 mmol/L — ABNORMAL HIGH (ref 98–111)
Chloride: 114 mmol/L — ABNORMAL HIGH (ref 98–111)
Creatinine, Ser: 0.56 mg/dL (ref 0.44–1.00)
Creatinine, Ser: 0.67 mg/dL (ref 0.44–1.00)
GFR calc Af Amer: >60 mL/min (ref >60)
GFR calc Af Amer: >60 mL/min (ref >60)
GFR calc non Af Amer: >60 mL/min (ref >60)
GFR calc non Af Amer: >60 mL/min (ref >60)
Glucose, Bld: 102 mg/dL — ABNORMAL HIGH (ref 70–99)
Glucose, Bld: 217 mg/dL — ABNORMAL HIGH (ref 70–99)
Potassium: 3.3 mmol/L — ABNORMAL LOW (ref 3.5–5.1)
Potassium: 3.8 mmol/L (ref 3.5–5.1)
Sodium: 147 mmol/L — ABNORMAL HIGH (ref 135–145)
Sodium: 143 mmol/L (ref 135–145)

## 2019-06-28 LAB — PHOSPHORUS: Phosphorus: 2.6 mg/dL (ref 2.5–4.6)

## 2019-06-28 LAB — GLUCOSE, CAPILLARY
Glucose-Capillary: 123 mg/dL — ABNORMAL HIGH (ref 70–99)
Glucose-Capillary: 94 mg/dL (ref 70–99)
Glucose-Capillary: 143 mg/dL — ABNORMAL HIGH (ref 70–99)
Glucose-Capillary: 189 mg/dL — ABNORMAL HIGH (ref 70–99)
Glucose-Capillary: 141 mg/dL — ABNORMAL HIGH (ref 70–99)
Glucose-Capillary: 195 mg/dL — ABNORMAL HIGH (ref 70–99)
Glucose-Capillary: 146 mg/dL — ABNORMAL HIGH (ref 70–99)

## 2019-06-28 LAB — CBC
HCT: 22.4 % — ABNORMAL LOW (ref 36.0–46.0)
Hemoglobin: 7.1 g/dL — ABNORMAL LOW (ref 12.0–15.0)
MCH: 26.8 pg (ref 26.0–34.0)
MCHC: 31.7 g/dL (ref 30.0–36.0)
MCV: 84.5 fL (ref 80.0–100.0)
Platelets: 144 10*3/uL — ABNORMAL LOW (ref 150–400)
RBC: 2.65 MIL/uL — ABNORMAL LOW (ref 3.87–5.11)
RDW: 15.7 % — ABNORMAL HIGH (ref 11.5–15.5)
WBC: 17.6 10*3/uL — ABNORMAL HIGH (ref 4.0–10.5)
nRBC: 1.7 % — ABNORMAL HIGH (ref 0.0–0.2)

## 2019-06-28 LAB — MAGNESIUM: Magnesium: 2 mg/dL (ref 1.7–2.4)

## 2019-06-28 LAB — ZINC: Zinc: 41 ug/dL — ABNORMAL LOW (ref 44–115)

## 2019-06-28 LAB — VANCOMYCIN, TROUGH: Vancomycin Tr: 14 ug/mL — ABNORMAL LOW (ref 15–20)

## 2019-06-28 MED ORDER — SODIUM CHLORIDE 0.9 % IV SOLN
250.0000 mL | INTRAVENOUS | Status: DC
  Administered 2019-06-29 – 2019-07-09 (×5): 250 mL via INTRAVENOUS

## 2019-06-28 MED ORDER — SODIUM CHLORIDE 0.9 % IV BOLUS: 1000.0000 mL | Freq: Once | INTRAVENOUS | Status: DC

## 2019-06-28 MED ORDER — OXYCODONE HCL 5 MG PO TABS
10.0000 mg | ORAL_TABLET | Freq: Four times a day (QID) | ORAL | Status: DC | PRN
  Administered 2019-06-28: 10 mg
  Filled 2019-06-28: qty 2

## 2019-06-28 MED ORDER — PHENYLEPHRINE HCL-NACL 10-0.9 MG/250ML-% IV SOLN: 25.0000 ug/min | INTRAVENOUS | Status: DC

## 2019-06-28 MED ORDER — POTASSIUM CHLORIDE 20 MEQ/15ML (10%) PO SOLN
20.0000 meq | ORAL | Status: AC
  Administered 2019-06-28 (×2): 20 meq
  Filled 2019-06-28 (×2): qty 15

## 2019-06-28 MED ORDER — HYDROCODONE-ACETAMINOPHEN 7.5-325 MG PO TABS
1.0000 | ORAL_TABLET | Freq: Four times a day (QID) | ORAL | Status: DC | PRN
  Administered 2019-06-28 – 2019-07-02 (×8): 1 via ORAL
  Filled 2019-06-28 (×9): qty 1

## 2019-06-28 MED ORDER — INSULIN DETEMIR 100 UNIT/ML ~~LOC~~ SOLN
30.0000 [IU] | Freq: Two times a day (BID) | SUBCUTANEOUS | Status: DC
  Administered 2019-06-28 – 2019-06-30 (×5): 30 [IU] via SUBCUTANEOUS
  Filled 2019-06-28 (×6): qty 0.3

## 2019-06-28 NOTE — Progress Notes (Signed)
Rehab Admissions Coordinator Note:  Per PT recommendation, this patient was screened by Cheri Rous for appropriateness for an Inpatient Acute Rehab Consult. Noted pt unable to progress past EOB today with therapy due to fatigue. At this time, we will follow for progress with therapies prior to placing consult order.    Cheri Rous 06/28/2019, 4:22 PM  I can be reached at [phone].

## 2019-06-28 NOTE — Progress Notes (Signed)
Pharmacy Antibiotic Note  Kimberly Orozco is a 44 y.o. female admitted on 06/23/2019 with sepsis.  Pharmacy has been consulted for Vancomycin and Cefepime dosing. Pt febrile with Tm 101.8. WBC elevated at 23.3, lactate 3.4.Scr 1.05>0.64. Patient with severe cellulitis of R leg tracking up to abdomen. S/p 2nd I&D on 4/15. Patient also on clindamycin for possible necrotizing fascitis.   She is off pressors this morning, if she continues to improve can likely stop clindamycin in 24 hours.   Vancomycin trough level drawn appropriately was 14, SSTI goal 10-15.  Estimated patient specific kinetics: Ke: 0.068 T1/2: 10hrs, Vd: 60L    Plan: - Continue vancomycin 1000 mg IV Q12H - Continue Cefepime to 2g IV Q8H  - Monitor renal function, cultures/sensitivities, clinical progression - Check vancomycin levels as indicated  Height: 5\' 7"  (170.2 cm) Weight: 119.3 kg (263 lb 0.1 oz) IBW/kg (Calculated) : 61.6  Temp (24hrs), Avg:100.2 F (37.9 C), Min:98.5 F (36.9 C), Max:100.9 F (38.3 C)  Recent Labs  Lab 06/23/19 1822 06/23/19 2018 06/23/19 2234 06/24/19 0135 06/24/19 0527 06/24/19 1443 06/25/19 0624 06/25/19 1846 06/25/19 2015 06/26/19 0425 06/26/19 0426 06/26/19 0429 06/26/19 1952 06/27/19 0413 06/27/19 2030 06/28/19 0431 06/28/19 0810  WBC  --    < >  --   --  20.3*  --  22.2*  --   --  23.3*  --   --   --  25.4*  --  17.6*  --   CREATININE  --    < > 0.98   < > 0.78   < > 1.05*  --    < >  --   --  0.64 0.61 0.59 0.55 0.56  --   LATICACIDVEN 3.9*  --  3.4*  --   --   --  4.1* 1.7  --   --  1.7  --   --   --   --   --   --   VANCOTROUGH  --   --   --   --   --   --   --   --   --   --   --   --   --   --   --   --  14*   < > = values in this interval not displayed.    Estimated Creatinine Clearance: 120 mL/min (by C-G formula based on SCr of 0.56 mg/dL).    No Known Allergies  Antimicrobials this admission: Vanc 4/12 >> Cefepime 4/12>> Clindamycin 4/12>> Flagyl 4/12  x1  Dose adjustments this admission: N/A  Microbiology results: 4/13 Surg cx: gram +/- rods (on abx) 4/12 UCx - multiple species 4/12 MRSA PCR - positive 4/12 Staph Aur - positive 4/12 Covid - negative 4/12 Influenza - negative  6/12, PharmD PGY2 Infectious Disease Pharmacy Resident   Please check [email or web address] for unit-specific pharmacy phone numbers.

## 2019-06-28 NOTE — Progress Notes (Addendum)
PULMONARY / CRITICAL CARE MEDICINE   NAME:  Kimberly Orozco, MRN:  [id number], DOB:  Dec 27, 1975, LOS: 5 ADMISSION DATE:  06/23/2019, CONSULTATION DATE:  06/23/19 REFERRING MD:  Dr. Regenia Skeeter, CHIEF COMPLAINT:  AMS  BRIEF HISTORY:    Kimberly Orozco is a 44 y/o female with a PMHx of T2DM, Schizophrenia, Bipolar Disorder who presents to the ED with c/o AMS and found to be in diabetic ketoacidosis complicated by necrotizing soft tissue infection.  SIGNIFICANT PAST MEDICAL HISTORY   - T2DM - Schizophrenia  - Bipolar Disorder   SIGNIFICANT EVENTS:  4/12: Admitted to the ICU 4/13: Intubated 4/13: Debridement 4/15: Second Debridement 4/16: Extubated  STUDIES:   CT abdomen/pelvis (4/12):  1. Extensive dermal thickening and subcutaneous fat stranding within the lower anterior abdominal wall, bilateral inguinal regions, and right-side perineum consistent with infection. No fluid collection or abscess. 2. Fibroid uterus. 3. Cholelithiasis without cholecystitis.  CT Femur (4/12):  1. Dermal thickening and subcutaneous fat stranding extending from the lower anterior abdominal wall through the right inguinal region and perineum, and involving the anterior proximal right thigh. Findings are consistent with infection. No fluid collection or abscess. 2. Significant osteoarthritis of the right hip and knee. 3. Reactive lymph nodes right inguinal region. CULTURES:  Blood cultures (4/12): no growth at 3 days Urine Cultures (4/12): multiple species  ANTIBIOTICS:  Metronidazole x1 (4/12) Cefepime (4/12) >> Vancomycin (4/12) >> Clindamycin (4/12) >> LINES/TUBES:  4/13 Intubated >> 4/16 4/14 Central Line Placement >>   CONSULTANTS:  General Surgery  SUBJECTIVE:  Patient is resting in bed. In some pain and dressings recently changed this AM. Asking for something to drink. Needs SLP eval.  CONSTITUTIONAL: BP (!) 100/57 (BP Location: Right Arm)   Pulse (!) 118   Temp 100 F (37.8 C)  (Bladder)   Resp (!) 29   Ht 5\' 7"  (1.702 m)   Wt 119.3 kg   SpO2 97%   BMI 41.19 kg/m   I/O last 3 completed shifts: In: 4431 [I.V.:2264.6; Other:150; NG/GT:802.3; IV Piggyback:1269.1] Out: 1990 [Urine:1865; Emesis/NG output:125]  CVP:  [3 mmHg-5 mmHg] 5 mmHg     PHYSICAL EXAM: Constitutional: Obese, resting comfortably HEENT: Moist MM Cardiovascular: RRR, No mrg, LE edema Pulmonary: Lungs Clear Bilaterall Abdomen: Decreased BS, Soft. S/p large debridement of skin and soft tissue 45cc x 30cc x 3-4cc (d) at right groin, inguinal region and RLQ. Packing in place.  Skin: Warm and dry.   Neurological: Alert, conversational   Psychiatric: Behavior is cooperative  RESOLVED PROBLEM LIST  DKA  ASSESSMENT AND PLAN   Sepsis with Septic Shock 2/2 Necrotizing Soft Tissue Infection: In the setting of uncontrolled diabetes. Volume up today. WBC down trending to 17 today. - General surgery following, do not intent to take her back to back to OR - S/P large debridement (45cc x 30cm) on 4/13; Revision 4/25 with debridement of 3-5cm additional tissue from bilateral edges.  - Vancomycin, Cefepime, Clindamycin per pharmacy (Day 6) - Levophed (goal MAP >65), wean - Follow Fever Curve, WBC, CVP - Wound Cx: GPCs, GPRs, GNRs, GVRs - Urine Cx: multiple species  - Pain Control: Fentanyl q2h PRN, Oxy IR q6h PRN. Will add longer acting if abe to swallow.  Acute respiratory Failure, Mechanical Ventilation - Successful extubation 4/16 - SLP eval today  AKI: Setting of sepsis with occult shock followed by frank shock  E-lyte disturbances - AGMA and NAGMA resolved - AKI resolved, UOP improved - Monitor Renal function and E-lytes, Replete PRN  Encephalopathy Schizophrenia Bipolar: Home meds benztropine, Divalproex, Haldol, Temazepam - Presented oriented to self and hospital only - Conitnue Haldol, Benztropine, Valproic Acid per Tube  Diabetes: P/W DKA, now resolved. Goal CBGs <180. -  Reduce Levemir 30U BID (Low CBGs with downtrending BS and Slowed TFs)  - TF coverage with Hold perameters - SSI q4h  Diarrhea: - Frequent watery stools - S/p flexiseal placement o/n - On tube feeds, On Abx, may consider C. Diff if continues  SUMMARY OF TODAY'S PLAN:  SLP, Pain control  Best Practice / Goals of Care / Disposition.   Diet: TFs Pain/Anxiety/Delirium protocol (if indicated): Fentanyl VAP protocol (if indicated): N/A DVT prophylaxis: Heparin GI prophylaxis: PPI Glucose control: Insulin as above Mobility: Bedrest Code Status: FULL Family Communication: Mother updated 4/14 by phone. Disposition: ICU  LABS  Glucose Recent Labs  Lab 06/27/19 1129 06/27/19 1603 06/27/19 2009 06/28/19 0105 06/28/19 0409 06/28/19 0750  GLUCAP 193* 107* 106* 123* 94 143*    BMET Recent Labs  Lab 06/27/19 0413 06/27/19 2030 06/28/19 0431  NA 143 141 147*  K 3.1* 3.3* 3.3*  CL 110 110 116*  CO2 23 23 25   BUN 15 16 16   CREATININE 0.59 0.55 0.56  GLUCOSE 188* 110* 102*    Liver Enzymes Recent Labs  Lab 06/23/19 1500  AST 27  ALT 26  ALKPHOS 149*  BILITOT 1.2  ALBUMIN 1.6*    Electrolytes Recent Labs  Lab 06/26/19 0425 06/26/19 0429 06/27/19 0413 06/27/19 2030 06/28/19 0431  CALCIUM  --    < > 7.1* 6.9* 7.0*  MG 1.7  --  1.6*  --  2.0  PHOS 1.8*  --  2.8  --  2.6   < > = values in this interval not displayed.    CBC Recent Labs  Lab 06/26/19 0425 06/27/19 0413 06/28/19 0431  WBC 23.3* 25.4* 17.6*  HGB 7.9* 7.7* 7.1*  HCT 24.3* 23.8* 22.4*  PLT 209 180 144*    ABG Recent Labs  Lab 06/24/19 1554 06/24/19 2220 06/25/19 0625  PHART 7.342* 7.358 7.437  PCO2ART 32.6 23.4* 24.5*  PO2ART 97.0 113.0* 118.0*    Coag's Recent Labs  Lab 06/23/19 1500  APTT 26  INR 1.6*    Sepsis Markers Recent Labs  Lab 06/25/19 0624 06/25/19 1846 06/26/19 0426  LATICACIDVEN 4.1* 1.7 1.7    Cardiac Enzymes No results for input(s): TROPONINI,  PROBNP in the last 168 hours.  PAST MEDICAL HISTORY :   She  has no past medical history on file.  PAST SURGICAL HISTORY:  She  has a past surgical history that includes Incision and drainage perirectal abscess (Right, 06/24/2019) and Incision and drainage of wound (N/A, 06/26/2019).  No Known Allergies  No current facility-administered medications on file prior to encounter.   Current Outpatient Medications on File Prior to Encounter  Medication Sig  . acetaminophen (TYLENOL) 325 MG tablet Take 650 mg by mouth every 6 (six) hours as needed for mild pain or headache.  . benztropine (COGENTIN) 1 MG tablet Take 1 tablet (1 mg total) by mouth 2 (two) times daily.  . cloNIDine (CATAPRES) 0.2 MG tablet Take 1 tablet (0.2 mg total) by mouth 2 (two) times daily.  . divalproex (DEPAKOTE SPRINKLE) 125 MG capsule Take 2 capsules (250 mg total) by mouth 2 (two) times daily.  . divalproex (DEPAKOTE) 250 MG DR tablet Take 250 mg by mouth daily.  . haloperidol (HALDOL) 10 MG tablet 1 in am 2 at hs (  Patient taking differently: Take 10 mg by mouth See admin instructions. 10mg  in in the morning, and 20mg  at bedtime.)  . haloperidol decanoate (HALDOL DECANOATE) 100 MG/ML injection Inject 1 mL (100 mg total) into the muscle every 30 (thirty) days. Due 3/15  . insulin aspart (NOVOLOG) 100 UNIT/ML injection Inject 4 Units into the skin 3 (three) times daily with meals.  . metFORMIN (GLUCOPHAGE-XR) 500 MG 24 hr tablet Take 1 tablet (500 mg total) by mouth daily with supper.  . propranolol ER (INDERAL LA) 80 MG 24 hr capsule Take 1 capsule (80 mg total) by mouth daily.  . temazepam (RESTORIL) 30 MG capsule Take 1 capsule (30 mg total) by mouth at bedtime.    FAMILY HISTORY:   Her family history is not on file.  SOCIAL HISTORY:  She  reports that she has never smoked. She has never used smokeless tobacco.  REVIEW OF SYSTEMS:    Negative except what is noted on subjective.  , DO IM PGY-1 Pager:  [phone]

## 2019-06-28 NOTE — Progress Notes (Signed)
eLink Physician-Brief Progress Note Patient Name: Kimberly Orozco DOB: 1975/10/24 MRN: [id number]   Date of Service  06/28/2019  HPI/Events of Note  Frequent watery stools - Necrotizing fascitis with many wounds. Request for Flexiseal.    eICU Interventions  Will place Flexiseal.      Intervention Category Major Interventions: Other:  Lenell Antu 06/28/2019, 1:51 AM

## 2019-06-28 NOTE — Progress Notes (Signed)
[facility] ADULT ICU REPLACEMENT PROTOCOL FOR AM LAB REPLACEMENT ONLY  The patient does apply for the [facility] Adult ICU Electrolyte Replacment Protocol based on the criteria listed below:   1. Is GFR >/= 40 ml/min? Yes.    Patient's GFR today is >60 2. Is urine output >/= 0.5 ml/kg/hr for the last 6 hours? Yes.   Patient's UOP is 0.68 ml/kg/hr 3. Is BUN < 60 mg/dL? Yes.    Patient's BUN today is 16 4. Abnormal electrolyte(s): K+3.3 5. Ordered repletion with: Protocol per tube 6. If a panic level lab has been reported, has the CCM MD in charge been notified? Yes.  Genelle Bal 06/28/2019 5:47 AM

## 2019-06-28 NOTE — Evaluation (Signed)
Physical Therapy Evaluation Patient Details Name: Kimberly Orozco MRN: [id number] DOB: [birth year] Today's Date: 06/28/2019   History of Present Illness  44 y/o female with a PMHx of T2DM, Schizophrenia, Bipolar Disorder who presents to the ED with c/o AMS and found to be in diabetic ketoacidosis complicated by necrotizing soft tissue infection.    Clinical Impression  Pt admitted with above diagnosis. PTA pt lived at home with her mother, independent. On eval, she required max assist bed mobility. She sat EOB x 2-3 minutes min guard assist. Unable to progress beyond EOB due to fatigue. Of note, hgb 7.1 at time of eval. Pt currently with functional limitations due to the deficits listed below (see PT Problem List). Pt will benefit from skilled PT to increase their independence and safety with mobility to allow discharge to the venue listed below.       Follow Up Recommendations CIR    Equipment Recommendations  Other (comment)(TBD)    Recommendations for Other Services Rehab consult     Precautions / Restrictions Precautions Precautions: Fall;Other (comment) Precaution Comments: flexiseal, coretrak, a line, central line Restrictions Weight Bearing Restrictions: No      Mobility  Bed Mobility Overal bed mobility: Needs Assistance Bed Mobility: Supine to Sit;Sit to Supine     Supine to sit: Max assist;HOB elevated Sit to supine: Max assist   General bed mobility comments: cues for sequencing, use of bed pad to scoot to EOB  Transfers                 General transfer comment: unable to progress beyond EOB  Ambulation/Gait             General Gait Details: unable  Stairs            Wheelchair Mobility    Modified Rankin (Stroke Patients Only)       Balance Overall balance assessment: Needs assistance Sitting-balance support: Bilateral upper extremity supported;Feet unsupported Sitting balance-Leahy Scale: Fair Sitting balance - Comments: Pt sat  EOB x 2-3 minutes min guard assist.                                     Pertinent Vitals/Pain Pain Assessment: Faces Faces Pain Scale: Hurts even more Pain Location: R groin during mobility Pain Descriptors / Indicators: Grimacing;Guarding;Moaning;Discomfort Pain Intervention(s): Monitored during session;Repositioned;Limited activity within patient's tolerance    Home Living Family/patient expects to be discharged to:: Private residence Living Arrangements: Parent Available Help at Discharge: Family;Available 24 hours/day Type of Home: House Home Access: Level entry     Home Layout: One level Home Equipment: None Additional Comments: section 8 housing with her mother    Prior Function Level of Independence: Independent               Hand Dominance        Extremity/Trunk Assessment   Upper Extremity Assessment Upper Extremity Assessment: Generalized weakness    Lower Extremity Assessment Lower Extremity Assessment: Generalized weakness    Cervical / Trunk Assessment Cervical / Trunk Assessment: Normal  Communication   Communication: No difficulties  Cognition Arousal/Alertness: Awake/alert Behavior During Therapy: Agitated;WFL for tasks assessed/performed Overall Cognitive Status: No family/caregiver present to determine baseline cognitive functioning                                 General Comments: A&O  x 4. Schizophrenia and bipolar at baseline. Easily agitated. Labile during session. Following one step commands inconsistently.      General Comments General comments (skin integrity, edema, etc.): Of note, Hgb 7.1 at time of eval. Pt without c/o dizziness.    Exercises General Exercises - Lower Extremity Ankle Circles/Pumps: AROM;Both;10 reps;Supine Hip ABduction/ADduction: AROM;AAROM;Right;Left;5 reps;Supine   Assessment/Plan    PT Assessment Patient needs continued PT services  PT Problem List Decreased  strength;Decreased mobility;Decreased safety awareness;Decreased skin integrity;Decreased activity tolerance;Decreased balance;Decreased knowledge of use of DME;Pain       PT Treatment Interventions DME instruction;Therapeutic activities;Gait training;Therapeutic exercise;Patient/family education;Balance training;Functional mobility training    PT Goals (Current goals can be found in the Care Plan section)  Acute Rehab PT Goals Patient Stated Goal: not stated PT Goal Formulation: With patient Time For Goal Achievement: 07/12/19 Potential to Achieve Goals: Good    Frequency Min 3X/week   Barriers to discharge        Co-evaluation               AM-PAC PT "6 Clicks" Mobility  Outcome Measure Help needed turning from your back to your side while in a flat bed without using bedrails?: A Lot Help needed moving from lying on your back to sitting on the side of a flat bed without using bedrails?: A Lot Help needed moving to and from a bed to a chair (including a wheelchair)?: A Lot Help needed standing up from a chair using your arms (e.g., wheelchair or bedside chair)?: A Lot Help needed to walk in hospital room?: Total Help needed climbing 3-5 steps with a railing? : Total 6 Click Score: 10    End of Session   Activity Tolerance: Patient limited by fatigue Patient left: in bed;with call bell/phone within reach Nurse Communication: Mobility status PT Visit Diagnosis: Other abnormalities of gait and mobility (R26.89);Muscle weakness (generalized) (M62.81);Pain    Time: 0902-0918 PT Time Calculation (min) (ACUTE ONLY): 16 min   Charges:   PT Evaluation $PT Eval Moderate Complexity: 1 Mod          Lorrin Goodell, PT  Office # [phone] Pager [phone]   Lorriane Shire 06/28/2019, 9:55 AM

## 2019-06-28 NOTE — Progress Notes (Signed)
2 Days Post-Op  Subjective: Patient extubated.  Given 100 of fentanyl prior to dressing change.  Patient is oriented.  ROS: See above, otherwise other systems negative  Objective: Vital signs in last 24 hours: Temp:  [99.1 F (37.3 C)-100.9 F (38.3 C)] 100 F (37.8 C) (04/17 0800) Pulse Rate:  [75-130] 118 (04/17 0800) Resp:  [14-33] 29 (04/17 0800) BP: (89-134)/(57-93) 100/57 (04/17 0800) SpO2:  [96 %-100 %] 97 % (04/17 0800) Arterial Line BP: (63-153)/(46-82) 110/49 (04/17 0800) Weight:  [119.3 kg] 119.3 kg (04/17 0500) Last BM Date: 06/28/19  Intake/Output from previous day: 04/16 0701 - 04/17 0700 In: 2479.5 [I.V.:1044.1; NG/GT:417.3; IV Piggyback:868.1] Out: 1385 [Urine:1260; Emesis/NG output:125] Intake/Output this shift: No intake/output data recorded.  PE: Skin: wound is overall still clean for the most part.  There is some fat along the edges that is necrotic, but overall base and edges of wounds are clean.  No further finger fracturing of remaining tissue is able to be done.  Lab Results:  Recent Labs    06/27/19 0413 06/28/19 0431  WBC 25.4* 17.6*  HGB 7.7* 7.1*  HCT 23.8* 22.4*  PLT 180 144*   BMET Recent Labs    06/27/19 2030 06/28/19 0431  NA 141 147*  K 3.3* 3.3*  CL 110 116*  CO2 23 25  GLUCOSE 110* 102*  BUN 16 16  CREATININE 0.55 0.56  CALCIUM 6.9* 7.0*   PT/INR No results for input(s): LABPROT, INR in the last 72 hours. CMP     Component Value Date/Time   NA 147 (H) 06/28/2019 0431   K 3.3 (L) 06/28/2019 0431   CL 116 (H) 06/28/2019 0431   CO2 25 06/28/2019 0431   GLUCOSE 102 (H) 06/28/2019 0431   BUN 16 06/28/2019 0431   CREATININE 0.56 06/28/2019 0431   CALCIUM 7.0 (L) 06/28/2019 0431   PROT 7.2 06/23/2019 1500   ALBUMIN 1.6 (L) 06/23/2019 1500   AST 27 06/23/2019 1500   ALT 26 06/23/2019 1500   ALKPHOS 149 (H) 06/23/2019 1500   BILITOT 1.2 06/23/2019 1500   GFRNONAA >60 06/28/2019 0431   GFRAA >60 06/28/2019 0431     Lipase  No results found for: LIPASE     Studies/Results: No results found.  Anti-infectives: Anti-infectives (From admission, onward)   Start     Dose/Rate Route Frequency Ordered Stop   06/26/19 1200  Vancomycin (VANCOCIN) 1,500 mg in sodium chloride 0.9 % 500 mL IVPB  Status:  Discontinued     1,500 mg 250 mL/hr over 120 Minutes Intravenous Every 24 hours 06/25/19 1153 06/26/19 0455   06/26/19 1200  vancomycin (VANCOREADY) IVPB 1500 mg/300 mL  Status:  Discontinued     1,500 mg 150 mL/hr over 120 Minutes Intravenous Every 24 hours 06/26/19 0455 06/26/19 0730   06/26/19 0830  vancomycin (VANCOREADY) IVPB 1500 mg/300 mL  Status:  Discontinued     1,000 mg 100 mL/hr over 120 Minutes Intravenous Every 12 hours 06/26/19 0730 06/26/19 0741   06/26/19 0830  vancomycin (VANCOCIN) IVPB 1000 mg/200 mL premix     1,000 mg 200 mL/hr over 60 Minutes Intravenous Every 12 hours 06/26/19 0742     06/25/19 1145  vancomycin (VANCOCIN) 500 mg in sodium chloride 0.9 % 100 mL IVPB     500 mg 100 mL/hr over 60 Minutes Intravenous NOW 06/25/19 1055 06/25/19 1241   06/24/19 1600  vancomycin (VANCOREADY) IVPB 750 mg/150 mL  Status:  Discontinued     750  mg 150 mL/hr over 60 Minutes Intravenous Every 12 hours 06/23/19 1700 06/24/19 0714   06/24/19 1600  vancomycin (VANCOCIN) IVPB 1000 mg/200 mL premix  Status:  Discontinued     1,000 mg 200 mL/hr over 60 Minutes Intravenous Every 12 hours 06/24/19 0714 06/24/19 0750   06/24/19 1400  clindamycin (CLEOCIN) IVPB 900 mg     900 mg 100 mL/hr over 30 Minutes Intravenous Every 8 hours 06/24/19 0756     06/24/19 1145  ceFEPIme (MAXIPIME) 2 g in sodium chloride 0.9 % 100 mL IVPB  Status:  Discontinued     2 g 200 mL/hr over 30 Minutes Intravenous To ShortStay Surgical 06/24/19 1131 06/24/19 1457   06/24/19 1030  ceFEPIme (MAXIPIME) 2 g in sodium chloride 0.9 % 100 mL IVPB     2 g 200 mL/hr over 30 Minutes Intravenous Every 8 hours 06/24/19 0714      06/24/19 0800  vancomycin (VANCOCIN) IVPB 1000 mg/200 mL premix  Status:  Discontinued     1,000 mg 200 mL/hr over 60 Minutes Intravenous Every 12 hours 06/24/19 0750 06/25/19 1153   06/24/19 0300  ceFEPIme (MAXIPIME) 2 g in sodium chloride 0.9 % 100 mL IVPB  Status:  Discontinued     2 g 200 mL/hr over 30 Minutes Intravenous Every 12 hours 06/23/19 1700 06/24/19 0714   06/23/19 1700  clindamycin (CLEOCIN) IVPB 600 mg  Status:  Discontinued     600 mg 100 mL/hr over 30 Minutes Intravenous Every 8 hours 06/23/19 1649 06/24/19 0756   06/23/19 1500  vancomycin (VANCOREADY) IVPB 2000 mg/400 mL     2,000 mg 200 mL/hr over 120 Minutes Intravenous  Once 06/23/19 1454 06/23/19 1747   06/23/19 1445  ceFEPIme (MAXIPIME) 2 g in sodium chloride 0.9 % 100 mL IVPB     2 g 200 mL/hr over 30 Minutes Intravenous  Once 06/23/19 1434 06/23/19 1557   06/23/19 1445  metroNIDAZOLE (FLAGYL) IVPB 500 mg     500 mg 100 mL/hr over 60 Minutes Intravenous  Once 06/23/19 1434 06/23/19 1608   06/23/19 1445  vancomycin (VANCOCIN) IVPB 1000 mg/200 mL premix  Status:  Discontinued     1,000 mg 200 mL/hr over 60 Minutes Intravenous  Once 06/23/19 1434 06/23/19 1440   06/23/19 1445  vancomycin (VANCOCIN) 2,000 mg in sodium chloride 0.9 % 500 mL IVPB  Status:  Discontinued     2,000 mg 250 mL/hr over 120 Minutes Intravenous  Once 06/23/19 1440 06/23/19 1454       Assessment/Plan T2DMwith DKA - CBGsin the 100-200sthis AM Acute renal failure - Cr normal Hypokalemia - K3.3this AM, replacement per CCM Schizophrenia Bipolar disorder ABL anemia - hgb 7.1  Septic shock - on levo, 45mcg Lactic acidosis   POD 2,4, NSTI of R abd wall and groin S/P I&D 45 x 30 x 4 cm 06/24/19 Dr. Rosendo Gros -low grade temp of 100.9. -cont BID dressing changes at the bedside. -WBC improving to 17K today -as pressors wean, may increase TFs again -may swallow per CCM   FEN: NPO, IVF; TFs trickle, may increase as pressor support  decreases.  May swallow study when ok with CCM VTE: SCDs, SQ heparin  ID: flagyl 4/12; vanc 4/12>>, cefepime 4/12>>, clinda 4/13   LOS: 5 days    Henreitta Cea , [facility] 06/28/2019, 8:32 AM Please see Amion for pager number during day hours 7:00am-4:30pm or 7:00am -11:30am on weekends

## 2019-06-28 NOTE — Evaluation (Signed)
Clinical/Bedside Swallow Evaluation Patient Details  Name: Kimberly Orozco MRN: [id number] Date of Birth: Apr 27, 1975  Today's Date: 06/28/2019 Time: SLP Start Time (ACUTE ONLY): 1135 SLP Stop Time (ACUTE ONLY): 1205 SLP Time Calculation (min) (ACUTE ONLY): 30 min  Past Medical History: History reviewed. No pertinent past medical history. Past Surgical History:  Past Surgical History:  Procedure Laterality Date  . INCISION AND DRAINAGE OF WOUND N/A 06/26/2019   Procedure: IRRIGATION AND DEBRIDEMENT ABDOMINAL WALL AND GROIN;  Surgeon: Ralene Ok, MD;  Location: [facility];  Service: General;  Laterality: N/A;  . INCISION AND DRAINAGE PERIRECTAL ABSCESS Right 06/24/2019   Procedure: IRRIGATION AND DEBRIDEMENT RIGHT GROIN AND RIGHT BUTTOCK;  Surgeon: Ralene Ok, MD;  Location: [facility];  Service: General;  Laterality: Right;   HPI:  Patient is a 44 y/o female with a PMHx of T2DM, Schizophrenia, Bipolar Disorder who presents to the ED with c/o AMS and found to be in diabetic ketoacidosis complicated by necrotizing soft tissue infection.   Assessment / Plan / Recommendation Clinical Impression  Patient presents with a mild oropharyngeal dysphagia with swallow initiation delays with thin liquids, decreased mastication efficiency with regular solids but without overt s/s of aspiration or penetration. When masticating, patient generally had mouth slightly open and demonstrated delays in oral transit of solid bolus. SLP Visit Diagnosis: Dysphagia, unspecified (R13.10)    Aspiration Risk  Mild aspiration risk    Diet Recommendation Dysphagia 2 (Fine chop);Thin liquid   Liquid Administration via: Cup;Straw Medication Administration: Whole meds with liquid Supervision: Staff to assist with self feeding;Comment(intermittent supervision if patient able to feed self) Compensations: Minimize environmental distractions;Slow rate;Small sips/bites Postural Changes: Seated upright at 90 degrees     Other  Recommendations Oral Care Recommendations: Oral care BID   Follow up Recommendations None      Frequency and Duration min 1 x/week  1 week       Prognosis Prognosis for Safe Diet Advancement: Good      Swallow Study   General Date of Onset: 06/23/19 HPI: Patient is a 44 y/o female with a PMHx of T2DM, Schizophrenia, Bipolar Disorder who presents to the ED with c/o AMS and found to be in diabetic ketoacidosis complicated by necrotizing soft tissue infection. Type of Study: Bedside Swallow Evaluation Previous Swallow Assessment: N/A Diet Prior to this Study: NPO Temperature Spikes Noted: Yes Respiratory Status: Nasal cannula History of Recent Intubation: Yes Length of Intubations (days): 4 days Date extubated: 06/28/19 Behavior/Cognition: Alert;Cooperative;Agitated;Requires cueing Oral Cavity Assessment: Dried secretions Oral Care Completed by SLP: Other (Comment)(performed minimal oral care secondary to patient not allowing for full oral care to remove dried secretions on tongue) Oral Cavity - Dentition: Poor condition;Missing dentition Vision: Functional for self-feeding Self-Feeding Abilities: Needs assist;Needs set up Patient Positioning: Upright in bed Baseline Vocal Quality: Normal Volitional Cough: Cognitively unable to elicit Volitional Swallow: Unable to elicit    Oral/Motor/Sensory Function Overall Oral Motor/Sensory Function: Mild impairment Facial ROM: Reduced right;Reduced left Facial Symmetry: Within Functional Limits Facial Strength: Within Functional Limits Lingual ROM: Reduced right;Reduced left Lingual Strength: Within Functional Limits Lingual Sensation: Within Functional Limits   Ice Chips Ice chips: Not tested   Thin Liquid Thin Liquid: Impaired Presentation: Straw Pharyngeal  Phase Impairments: Suspected delayed Swallow Other Comments: No overt s/s of aspiration or penetration observed    Nectar Thick     Honey Thick     Puree Puree:  Within functional limits Presentation: Spoon   Solid     Solid: Impaired  Oral Phase Impairments: Impaired mastication Oral Phase Functional Implications: Prolonged oral transit;Impaired mastication     Angela Nevin, MA, CCC-SLP Speech Therapy [facility] Acute Rehab Pager: [phone]

## 2019-06-29 DIAGNOSIS — E0810 Diabetes mellitus due to underlying condition with ketoacidosis without coma: Secondary | ICD-10-CM

## 2019-06-29 LAB — BASIC METABOLIC PANEL
Anion gap: 7 (ref 5–15)
BUN: 20 mg/dL (ref 6–20)
CO2: 26 mmol/L (ref 22–32)
Calcium: 7.3 mg/dL — ABNORMAL LOW (ref 8.9–10.3)
Chloride: 115 mmol/L — ABNORMAL HIGH (ref 98–111)
Creatinine, Ser: 0.62 mg/dL (ref 0.44–1.00)
GFR calc Af Amer: >60 mL/min (ref >60)
GFR calc non Af Amer: >60 mL/min (ref >60)
Glucose, Bld: 98 mg/dL (ref 70–99)
Potassium: 3.4 mmol/L — ABNORMAL LOW (ref 3.5–5.1)
Sodium: 148 mmol/L — ABNORMAL HIGH (ref 135–145)

## 2019-06-29 LAB — CBC
HCT: 21.7 % — ABNORMAL LOW (ref 36.0–46.0)
Hemoglobin: 6.8 g/dL — CL (ref 12.0–15.0)
MCH: 27.4 pg (ref 26.0–34.0)
MCHC: 31.3 g/dL (ref 30.0–36.0)
MCV: 87.5 fL (ref 80.0–100.0)
Platelets: 166 10*3/uL (ref 150–400)
RBC: 2.48 MIL/uL — ABNORMAL LOW (ref 3.87–5.11)
RDW: 15.9 % — ABNORMAL HIGH (ref 11.5–15.5)
WBC: 15.7 10*3/uL — ABNORMAL HIGH (ref 4.0–10.5)
nRBC: 0.8 % — ABNORMAL HIGH (ref 0.0–0.2)

## 2019-06-29 LAB — AEROBIC/ANAEROBIC CULTURE W GRAM STAIN (SURGICAL/DEEP WOUND): Culture: NORMAL

## 2019-06-29 LAB — GLUCOSE, CAPILLARY
Glucose-Capillary: 136 mg/dL — ABNORMAL HIGH (ref 70–99)
Glucose-Capillary: 157 mg/dL — ABNORMAL HIGH (ref 70–99)
Glucose-Capillary: 161 mg/dL — ABNORMAL HIGH (ref 70–99)
Glucose-Capillary: 207 mg/dL — ABNORMAL HIGH (ref 70–99)
Glucose-Capillary: 230 mg/dL — ABNORMAL HIGH (ref 70–99)
Glucose-Capillary: 271 mg/dL — ABNORMAL HIGH (ref 70–99)

## 2019-06-29 LAB — MAGNESIUM: Magnesium: 2.2 mg/dL (ref 1.7–2.4)

## 2019-06-29 LAB — VITAMIN C: Vitamin C: 0.3 mg/dL — ABNORMAL LOW (ref 0.4–2.0)

## 2019-06-29 LAB — ABO/RH: ABO/RH(D): O POS

## 2019-06-29 LAB — PHOSPHORUS: Phosphorus: 2.5 mg/dL (ref 2.5–4.6)

## 2019-06-29 LAB — PREPARE RBC (CROSSMATCH)

## 2019-06-29 MED ORDER — SODIUM CHLORIDE 0.9% IV SOLUTION: Freq: Once | INTRAVENOUS | Status: AC

## 2019-06-29 MED ORDER — FENTANYL CITRATE (PF) 100 MCG/2ML IJ SOLN: 100.0000 ug | Freq: Once | INTRAMUSCULAR | Status: AC

## 2019-06-29 MED ORDER — PANTOPRAZOLE SODIUM 40 MG PO PACK
40.0000 mg | PACK | Freq: Every day | ORAL | Status: DC
  Administered 2019-06-30: 10:00:00 40 mg
  Filled 2019-06-29 (×2): qty 20

## 2019-06-29 MED ORDER — POTASSIUM CHLORIDE 20 MEQ/15ML (10%) PO SOLN
40.0000 meq | Freq: Once | ORAL | Status: AC
  Administered 2019-06-29: 40 meq
  Filled 2019-06-29: qty 30

## 2019-06-29 NOTE — Progress Notes (Signed)
PROGRESS NOTE  Kimberly Orozco  TWS:[id number] DOB: December 18, 1975 DOA: 06/23/2019 PCP: Patient, No Pcp Per   Brief Narrative: Kimberly Orozco is a 44 y.o. female with a history of schizophrenia, bipolar disorder, obesity and T2DM who presented to the ED 4/12 with altered mental status found to be in DKA and with necrotizing soft tissue infection over lower abdominal wall and upper RLE extending into perineum. She was taken for extensive debridement by surgery on 4/13 and subsequently developed shock requiring pressors. Transient oliguria was noted, improved with fluid resuscitation. Taken back to ED for further debridement 4/15 and ultimately liberated from pressors and extubated 4/16. DKE has resolved and the patient has been transferred to hospitalist service on 4/18.   Assessment & Plan: Active Problems:   DKA (diabetic ketoacidoses) (HCC)   Necrotizing cellulitis   Septic shock (HCC)   AKI (acute kidney injury) (HCC)   Acute respiratory failure (HCC)  Septic shock due to necrotizing soft tissue infection: Complicated by uncontrolled hyperglycemia due to diabetes.  - Continue IV antibiotics, now that shock is resolved, can DC clindamycin, continue vancomycin, cefepime - Monitor blood and wound cultures (pending). - Continue po and IV pain control - Wound care per general surgery. s/p large debridement (45cc x 30cm) on 4/13 and repeat debridement of 3-5cm additional tissue from bilateral edges 4/15.No current plans for repeat debridement.  - Monitor volume status.  Acute blood loss anemia: Following extensive surgery, hgb declining to 6.8g/dl. - Transfuse 1u PRBCs, monitor CBC.   Acute hypoxic respiratory failure: s/p ETT 4/13 - 4/16.  - Continue supplemental oxygen if needed to maintain SpO2 90%+.   DKA, uncontrolled T2DM: DKA resolved. HbA1c 14.4%. - Continue basal-bolus insulin with levemir 30u BID and q4h sliding scale with tube feeds ongoing.  - Will benefit from diabetes education  once outside scope of such severe illness.  AKI: Resolved.   Hypokalemia:  - supplement  Acute metabolic encephalopathy: Due to ICU delirium, shock, metabolic imbalances. - Delirium precautions, continue home psychotropic medications as below.  - Continue tube feeds.  Bipolar disorder, schizophrenia:  - Continue haldol, temazepam, divalproex and cogentin. May be having some paranoia which could require psychiatry consultation. Will monitor as delirium hopefully improves.   Loose stools: In setting of tube feeds.  - DC clindamycin.  - Abd benign. Will follow exam, fever curve, leukocytosis, and output.  - Continue flexiseal for now.  RN Pressure Injury Documentation: Pressure Injury 06/23/19 Buttocks Left (Active)  06/23/19 1945  Location: Buttocks  Location Orientation: Left  Staging:   Wound Description (Comments):   Present on Admission: Yes    Obesity: Estimated body mass index is 41.19 kg/m as calculated from the following:   Height as of this encounter: 5\' 7"  (1.702 m).   Weight as of this encounter: 119.3 kg.  DVT prophylaxis: Lovenox Code Status: Full Family Communication: None at bedside Disposition Plan:  Status is: Inpatient  Remains inpatient appropriate because:Hemodynamically unstable, Persistent severe electrolyte disturbances, Altered mental status, Ongoing diagnostic testing needed not appropriate for outpatient work up, IV treatments appropriate due to intensity of illness or inability to take PO and Inpatient level of care appropriate due to severity of illness   Dispo: The patient is from: Home              Anticipated d/c is to: TBD              Anticipated d/c date is: > 3 days  Patient currently is not medically stable to d/c.  Consultants:   PCCM  General surgery  Procedures:   Central line placement 4/14  ETT 4/13 - 4/16 06/24/19 IRRIGATION AND DEBRIDEMENT RIGHT GROIN AND RIGHT BUTTOCK Axel Filler, MD  06/24/19  IRRIGATION AND DEBRIDEMENT OF Melina Fiddler, MD  06/26/19 IRRIGATION AND DEBRIDEMENT ABDOMINAL WALL AND GROIN       Antimicrobials: Metronidazole x1 (4/12) Cefepime (4/12) >> Vancomycin (4/12) >> Clindamycin (4/12) - 4/18  Subjective: Pain is improved with medications, but not completely, confused but following commands.  Objective: Vitals:   06/29/19 1215 06/29/19 1230 06/29/19 1300 06/29/19 1500  BP: 126/82  134/81   Pulse: (!) 112 (!) 112 (!) 109 (!) 111  Resp: (!) 30 (!) 29 (!) 30 (!) 26  Temp: 99.1 F (37.3 C) 99.3 F (37.4 C) 99.3 F (37.4 C) 99 F (37.2 C)  TempSrc:      SpO2: 99% 98% 98% 98%  Weight:      Height:        Intake/Output Summary (Last 24 hours) at 06/29/2019 1658 Last data filed at 06/29/2019 1500 Gross per 24 hour  Intake 1416.64 ml  Output 1330 ml  Net 86.64 ml   Filed Weights   06/26/19 0500 06/27/19 0419 06/28/19 0500  Weight: 118.6 kg 124 kg 119.3 kg    Gen: 44 y.o. female in no distress  Pulm: Non-labored breathing. Clear to auscultation bilaterally.  CV: Regular tachycardia. No murmur, rub, or gallop. No pitting pedal edema. GI: Abdomen soft, non-tender, non-distended, with normoactive bowel sounds. No organomegaly or masses felt. Ext: Warm, no deformities Skin: Large dressing to RLQ abd extending to RLE c/d/i. Neuro: Alert and incompletely oriented. MAEW Psych: Judgement and insight appear normal. Mood & affect appropriate.   Data Reviewed: I have personally reviewed following labs and imaging studies  CBC: Recent Labs  Lab 06/23/19 1500 06/23/19 1747 06/25/19 0624 06/25/19 0624 06/25/19 0625 06/26/19 0425 06/27/19 0413 06/28/19 0431 06/29/19 0633  WBC 27.3*   < > 22.2*  --   --  23.3* 25.4* 17.6* 15.7*  NEUTROABS 22.4*  --   --   --   --   --   --   --   --   HGB 13.7   < > 9.2*   < > 8.2* 7.9* 7.7* 7.1* 6.8*  HCT 45.7   < > 28.2*   < > 24.0* 24.3* 23.8* 22.4* 21.7*  MCV 89.1   < > 83.4  --   --  80.7 81.2 84.5  87.5  PLT 314   < > 245  --   --  209 180 144* 166   < > = values in this interval not displayed.   Basic Metabolic Panel: Recent Labs  Lab 06/24/19 0527 06/24/19 0527 06/24/19 1443 06/24/19 1554 06/26/19 0425 06/26/19 0429 06/27/19 0413 06/27/19 2030 06/28/19 0431 06/28/19 2044 06/29/19 0633  NA 144   < > 144   < >  --    < > 143 141 147* 143 148*  K 2.9*   < > 3.9   < >  --    < > 3.1* 3.3* 3.3* 3.8 3.4*  CL 117*   < > 120*   < >  --    < > 110 110 116* 114* 115*  CO2 14*   < > 14*   < >  --    < > 23 23 25 25 26   GLUCOSE 210*   < >  194*   < >  --    < > 188* 110* 102* 217* 98  BUN 25*   < > 24*   < >  --    < > 15 16 16  21* 20  CREATININE 0.78   < > 0.82   < >  --    < > 0.59 0.55 0.56 0.67 0.62  CALCIUM 9.8   < > 8.9   < >  --    < > 7.1* 6.9* 7.0* 6.9* 7.3*  MG 2.2  --   --   --  1.7  --  1.6*  --  2.0  --  2.2  PHOS <1.0*   < > 3.4  --  1.8*  --  2.8  --  2.6  --  2.5   < > = values in this interval not displayed.   GFR: Estimated Creatinine Clearance: 120 mL/min (by C-G formula based on SCr of 0.62 mg/dL). Liver Function Tests: Recent Labs  Lab 06/23/19 1500  AST 27  ALT 26  ALKPHOS 149*  BILITOT 1.2  PROT 7.2  ALBUMIN 1.6*   No results for input(s): LIPASE, AMYLASE in the last 168 hours. Recent Labs  Lab 06/23/19 1501  AMMONIA 72*   Coagulation Profile: Recent Labs  Lab 06/23/19 1500  INR 1.6*   Cardiac Enzymes: No results for input(s): CKTOTAL, CKMB, CKMBINDEX, TROPONINI in the last 168 hours. BNP (last 3 results) No results for input(s): PROBNP in the last 8760 hours. HbA1C: No results for input(s): HGBA1C in the last 72 hours. CBG: Recent Labs  Lab 06/29/19 0031 06/29/19 0327 06/29/19 0748 06/29/19 1155 06/29/19 1530  GLUCAP 157* 161* 136* 271* 207*   Lipid Profile: No results for input(s): CHOL, HDL, LDLCALC, TRIG, CHOLHDL, LDLDIRECT in the last 72 hours. Thyroid Function Tests: No results for input(s): TSH, T4TOTAL, FREET4,  T3FREE, THYROIDAB in the last 72 hours. Anemia Panel: No results for input(s): VITAMINB12, FOLATE, FERRITIN, TIBC, IRON, RETICCTPCT in the last 72 hours. Urine analysis:    Component Value Date/Time   COLORURINE YELLOW 06/23/2019 1715   APPEARANCEUR CLOUDY (A) 06/23/2019 1715   LABSPEC 1.028 06/23/2019 1715   PHURINE 6.0 06/23/2019 1715   GLUCOSEU >=500 (A) 06/23/2019 1715   HGBUR LARGE (A) 06/23/2019 1715   BILIRUBINUR NEGATIVE 06/23/2019 1715   KETONESUR 20 (A) 06/23/2019 1715   PROTEINUR 30 (A) 06/23/2019 1715   NITRITE NEGATIVE 06/23/2019 1715   LEUKOCYTESUR LARGE (A) 06/23/2019 1715   Recent Results (from the past 240 hour(s))  Blood Culture (routine x 2)     Status: None   Collection Time: 06/23/19  2:55 PM   Specimen: BLOOD  Result Value Ref Range Status   Specimen Description BLOOD LEFT ANTECUBITAL  Final   Special Requests   Final    BOTTLES DRAWN AEROBIC AND ANAEROBIC Blood Culture results may not be optimal due to an inadequate volume of blood received in culture bottles   Culture   Final    NO GROWTH 5 DAYS Performed at [facility], [address] [place], [place]    Report Status 06/28/2019 FINAL  Final  Blood Culture (routine x 2)     Status: None   Collection Time: 06/23/19  3:00 PM   Specimen: BLOOD  Result Value Ref Range Status   Specimen Description BLOOD RIGHT ANTECUBITAL  Final   Special Requests   Final    BOTTLES DRAWN AEROBIC AND ANAEROBIC Blood Culture results may  not be optimal due to an inadequate volume of blood received in culture bottles   Culture   Final    NO GROWTH 5 DAYS Performed at [facility], [address] [place], [place] [address]    Report Status 06/28/2019 FINAL  Final  Respiratory Panel by RT PCR (Flu A&B, Covid) - Nasopharyngeal Swab     Status: None   Collection Time: 06/23/19  3:21 PM   Specimen: Nasopharyngeal Swab  Result Value Ref Range Status   SARS Coronavirus 2 by RT PCR NEGATIVE NEGATIVE  Final    Comment: (NOTE) SARS-CoV-2 target nucleic acids are NOT DETECTED. The SARS-CoV-2 RNA is generally detectable in upper respiratoy specimens during the acute phase of infection. The lowest concentration of SARS-CoV-2 viral copies this assay can detect is 131 copies/mL. A negative result does not preclude SARS-Cov-2 infection and should not be used as the sole basis for treatment or other patient management decisions. A negative result may occur with  improper specimen collection/handling, submission of specimen other than nasopharyngeal swab, presence of viral mutation(s) within the areas targeted by this assay, and inadequate number of viral copies (<131 copies/mL). A negative result must be combined with clinical observations, patient history, and epidemiological information. The expected result is Negative. Fact Sheet for Patients:  https://www.moore.com/https://www.fda.gov/media/142436/download Fact Sheet for Healthcare Providers:  https://www.young.biz/https://www.fda.gov/media/142435/download This test is not yet ap proved or cleared by the [place] FDA and  has been authorized for detection and/or diagnosis of SARS-CoV-2 by FDA under an Emergency Use Authorization (EUA). This EUA will remain  in effect (meaning this test can be used) for the duration of the COVID-19 declaration under Section 564(b)(1) of the Act, 21 U.S.C. section 360bbb-3(b)(1), unless the authorization is terminated or revoked sooner.    Influenza A by PCR NEGATIVE NEGATIVE Final   Influenza B by PCR NEGATIVE NEGATIVE Final    Comment: (NOTE) The Xpert Xpress SARS-CoV-2/FLU/RSV assay is intended as an aid in  the diagnosis of influenza from Nasopharyngeal swab specimens and  should not be used as a sole basis for treatment. Nasal washings and  aspirates are unacceptable for Xpert Xpress SARS-CoV-2/FLU/RSV  testing. Fact Sheet for Patients: https://www.moore.com/https://www.fda.gov/media/142436/download Fact Sheet for Healthcare  Providers: https://www.young.biz/https://www.fda.gov/media/142435/download This test is not yet approved or cleared by the [place] FDA and  has been authorized for detection and/or diagnosis of SARS-CoV-2 by  FDA under an Emergency Use Authorization (EUA). This EUA will remain  in effect (meaning this test can be used) for the duration of the  Covid-19 declaration under Section 564(b)(1) of the Act, 21  U.S.C. section 360bbb-3(b)(1), unless the authorization is  terminated or revoked. Performed at [facility], [address] [place], [place] [address]   Urine culture     Status: Abnormal   Collection Time: 06/23/19  5:20 PM   Specimen: In/Out Cath Urine  Result Value Ref Range Status   Specimen Description IN/OUT CATH URINE  Final   Special Requests   Final    NONE Performed at [facility], [address] [place], [place] [address]    Culture MULTIPLE SPECIES PRESENT, SUGGEST RECOLLECTION (A)  Final   Report Status 06/25/2019 FINAL  Final  Surgical pcr screen     Status: Abnormal   Collection Time: 06/23/19  7:44 PM   Specimen: Nasal Mucosa; Nasal Swab  Result Value Ref Range Status   MRSA, PCR POSITIVE (A) NEGATIVE Final    Comment: RESULT CALLED TO, READ  BACK BY AND VERIFIED WITH: J. PARRISH,CHARGE RN [id number] 06/23/2019 T. TYSOR    Staphylococcus aureus POSITIVE (A) NEGATIVE Final    Comment: (NOTE) The Xpert SA Assay (FDA approved for NASAL specimens in patients 62 years of age and older), is one component of a comprehensive surveillance program. It is not intended to diagnose infection nor to guide or monitor treatment. Performed at [facility], [place] [address] [place], [place] [address]   Aerobic/Anaerobic Culture (surgical/deep wound)     Status: None   Collection Time: 06/24/19 12:43 PM   Specimen: PATH Soft tissue  Result Value Ref Range Status   Specimen Description ABSCESS LABIA  Final   Special Requests PT ON CEFEPIME VANC  Final   Gram Stain   Final     RARE WBC PRESENT, PREDOMINANTLY PMN ABUNDANT GRAM POSITIVE RODS ABUNDANT GRAM NEGATIVE RODS MODERATE GRAM VARIABLE ROD FEW GRAM POSITIVE COCCI    Culture   Final    NORMAL VAGINAL FLORA NO ANAEROBES ISOLATED Performed at [facility], [place] [address], [place], [place] [address]    Report Status 06/29/2019 FINAL  Final      Radiology Studies: No results found.  Scheduled Meds: . vitamin C  500 mg Per Tube BID  . benztropine  1 mg Per Tube BID  . chlorhexidine  15 mL Mouth Rinse BID  . Chlorhexidine Gluconate Cloth  6 each Topical Daily  . enoxaparin (LOVENOX) injection  40 mg Subcutaneous Q24H  . feeding supplement (PRO-STAT SUGAR FREE 64)  60 mL Oral TID  . Gerhardt's butt cream   Topical BID  . haloperidol  10 mg Per Tube q morning - 10a  . haloperidol  20 mg Per Tube QHS  . insulin aspart  2-6 Units Subcutaneous Q4H  . insulin detemir  30 Units Subcutaneous Q12H  . mouth rinse  15 mL Mouth Rinse q12n4p  . pantoprazole sodium  40 mg Per Tube Daily  . sodium chloride flush  10-40 mL Intracatheter Q12H  . valproic acid  250 mg Per Tube BID  . zinc sulfate  220 mg Per Tube Daily   Continuous Infusions: . sodium chloride 120 mL/hr at 06/29/19 1200  . ceFEPime (MAXIPIME) IV 2 g (06/29/19 1358)  . feeding supplement (PIVOT 1.5 CAL) 15 mL/hr at 06/28/19 0600  . vancomycin Stopped (06/29/19 0857)     LOS: 6 days   Time spent: 35 minutes.  Patrecia Pour, MD [facility] [email or web address] 06/29/2019, 4:58 PM

## 2019-06-29 NOTE — Progress Notes (Signed)
Patient ID: Kimberly Orozco, female   DOB: 1975-07-05, 44 y.o.   MRN: [id number]    3 Days Post-Op  Subjective: Patient passed for a D2 diet.  Talks but some of it is nonsense and some of it makes sense  ROS: unable due to mental status  Objective: Vital signs in last 24 hours: Temp:  [98.5 F (36.9 C)-100 F (37.8 C)] 98.8 F (37.1 C) (04/18 0700) Pulse Rate:  [98-120] 98 (04/18 0700) Resp:  [13-34] 27 (04/18 0700) BP: (96-131)/(55-82) 115/67 (04/18 0700) SpO2:  [96 %-100 %] 99 % (04/18 0700) Arterial Line BP: (109-142)/(47-63) 129/58 (04/18 0000) Last BM Date: 06/28/19  Intake/Output from previous day: 04/17 0701 - 04/18 0700 In: 340.9 [I.V.:19.5; NG/GT:165; IV Piggyback:156.4] Out: 1605 [Urine:1455; Emesis/NG output:150] Intake/Output this shift: No intake/output data recorded.  PE: Skin: wound is stable and overall clean.  No purulent drainage or necrotic tissue that needs further debridement.  Lab Results:  Recent Labs    06/27/19 0413 06/28/19 0431  WBC 25.4* 17.6*  HGB 7.7* 7.1*  HCT 23.8* 22.4*  PLT 180 144*   BMET Recent Labs    06/28/19 2044 06/29/19 0633  NA 143 148*  K 3.8 3.4*  CL 114* 115*  CO2 25 26  GLUCOSE 217* 98  BUN 21* 20  CREATININE 0.67 0.62  CALCIUM 6.9* 7.3*   PT/INR No results for input(s): LABPROT, INR in the last 72 hours. CMP     Component Value Date/Time   NA 148 (H) 06/29/2019 0633   K 3.4 (L) 06/29/2019 0633   CL 115 (H) 06/29/2019 0633   CO2 26 06/29/2019 0633   GLUCOSE 98 06/29/2019 0633   BUN 20 06/29/2019 0633   CREATININE 0.62 06/29/2019 0633   CALCIUM 7.3 (L) 06/29/2019 0633   PROT 7.2 06/23/2019 1500   ALBUMIN 1.6 (L) 06/23/2019 1500   AST 27 06/23/2019 1500   ALT 26 06/23/2019 1500   ALKPHOS 149 (H) 06/23/2019 1500   BILITOT 1.2 06/23/2019 1500   GFRNONAA >60 06/29/2019 0633   GFRAA >60 06/29/2019 0633   Lipase  No results found for: LIPASE     Studies/Results: No results  found.  Anti-infectives: Anti-infectives (From admission, onward)   Start     Dose/Rate Route Frequency Ordered Stop   06/26/19 1200  Vancomycin (VANCOCIN) 1,500 mg in sodium chloride 0.9 % 500 mL IVPB  Status:  Discontinued     1,500 mg 250 mL/hr over 120 Minutes Intravenous Every 24 hours 06/25/19 1153 06/26/19 0455   06/26/19 1200  vancomycin (VANCOREADY) IVPB 1500 mg/300 mL  Status:  Discontinued     1,500 mg 150 mL/hr over 120 Minutes Intravenous Every 24 hours 06/26/19 0455 06/26/19 0730   06/26/19 0830  vancomycin (VANCOREADY) IVPB 1500 mg/300 mL  Status:  Discontinued     1,000 mg 100 mL/hr over 120 Minutes Intravenous Every 12 hours 06/26/19 0730 06/26/19 0741   06/26/19 0830  vancomycin (VANCOCIN) IVPB 1000 mg/200 mL premix     1,000 mg 200 mL/hr over 60 Minutes Intravenous Every 12 hours 06/26/19 0742     06/25/19 1145  vancomycin (VANCOCIN) 500 mg in sodium chloride 0.9 % 100 mL IVPB     500 mg 100 mL/hr over 60 Minutes Intravenous NOW 06/25/19 1055 06/25/19 1241   06/24/19 1600  vancomycin (VANCOREADY) IVPB 750 mg/150 mL  Status:  Discontinued     750 mg 150 mL/hr over 60 Minutes Intravenous Every 12 hours 06/23/19 1700 06/24/19 0714  06/24/19 1600  vancomycin (VANCOCIN) IVPB 1000 mg/200 mL premix  Status:  Discontinued     1,000 mg 200 mL/hr over 60 Minutes Intravenous Every 12 hours 06/24/19 0714 06/24/19 0750   06/24/19 1400  clindamycin (CLEOCIN) IVPB 900 mg     900 mg 100 mL/hr over 30 Minutes Intravenous Every 8 hours 06/24/19 0756     06/24/19 1145  ceFEPIme (MAXIPIME) 2 g in sodium chloride 0.9 % 100 mL IVPB  Status:  Discontinued     2 g 200 mL/hr over 30 Minutes Intravenous To ShortStay Surgical 06/24/19 1131 06/24/19 1457   06/24/19 1030  ceFEPIme (MAXIPIME) 2 g in sodium chloride 0.9 % 100 mL IVPB     2 g 200 mL/hr over 30 Minutes Intravenous Every 8 hours 06/24/19 0714     06/24/19 0800  vancomycin (VANCOCIN) IVPB 1000 mg/200 mL premix  Status:   Discontinued     1,000 mg 200 mL/hr over 60 Minutes Intravenous Every 12 hours 06/24/19 0750 06/25/19 1153   06/24/19 0300  ceFEPIme (MAXIPIME) 2 g in sodium chloride 0.9 % 100 mL IVPB  Status:  Discontinued     2 g 200 mL/hr over 30 Minutes Intravenous Every 12 hours 06/23/19 1700 06/24/19 0714   06/23/19 1700  clindamycin (CLEOCIN) IVPB 600 mg  Status:  Discontinued     600 mg 100 mL/hr over 30 Minutes Intravenous Every 8 hours 06/23/19 1649 06/24/19 0756   06/23/19 1500  vancomycin (VANCOREADY) IVPB 2000 mg/400 mL     2,000 mg 200 mL/hr over 120 Minutes Intravenous  Once 06/23/19 1454 06/23/19 1747   06/23/19 1445  ceFEPIme (MAXIPIME) 2 g in sodium chloride 0.9 % 100 mL IVPB     2 g 200 mL/hr over 30 Minutes Intravenous  Once 06/23/19 1434 06/23/19 1557   06/23/19 1445  metroNIDAZOLE (FLAGYL) IVPB 500 mg     500 mg 100 mL/hr over 60 Minutes Intravenous  Once 06/23/19 1434 06/23/19 1608   06/23/19 1445  vancomycin (VANCOCIN) IVPB 1000 mg/200 mL premix  Status:  Discontinued     1,000 mg 200 mL/hr over 60 Minutes Intravenous  Once 06/23/19 1434 06/23/19 1440   06/23/19 1445  vancomycin (VANCOCIN) 2,000 mg in sodium chloride 0.9 % 500 mL IVPB  Status:  Discontinued     2,000 mg 250 mL/hr over 120 Minutes Intravenous  Once 06/23/19 1440 06/23/19 1454       Assessment/Plan T2DMwith DKA - CBGsin the100-200sthis AM Acute renal failure - Crnormal Hypokalemia - K3.8 Schizophrenia Bipolar disorder ABL anemia - hgb pending this am Septic shock - off levo Lactic acidosis   POD 3,5, NSTI of R abd wall and groin S/P I&D 45 x 30 x 4 cm 06/24/19 Dr. Rosendo Gros -Tmax 100 -cont BID dressing changes at the bedside. -WBC pending today -off pressors  -on D2 diet, would DC Cortrak if ok with triad   FEN: D2 diet VTE: SCDs, SQ heparin  ID: flagyl 4/12; vanc 4/12>>, cefepime 4/12>>, clinda 4/13   LOS: 6 days    Henreitta Cea , [facility] 06/29/2019, 7:46  AM Please see Amion for pager number during day hours 7:00am-4:30pm or 7:00am -11:30am on weekends

## 2019-06-30 ENCOUNTER — Inpatient Hospital Stay: Payer: Self-pay

## 2019-06-30 LAB — TYPE AND SCREEN
ABO/RH(D): O POS
Antibody Screen: NEGATIVE
Unit division: 0

## 2019-06-30 LAB — COMPREHENSIVE METABOLIC PANEL
ALT: 29 U/L (ref 0–44)
AST: 30 U/L (ref 15–41)
Albumin: 1.2 g/dL — ABNORMAL LOW (ref 3.5–5.0)
Alkaline Phosphatase: 129 U/L — ABNORMAL HIGH (ref 38–126)
Anion gap: 10 (ref 5–15)
BUN: 19 mg/dL (ref 6–20)
CO2: 25 mmol/L (ref 22–32)
Calcium: 7.7 mg/dL — ABNORMAL LOW (ref 8.9–10.3)
Chloride: 111 mmol/L (ref 98–111)
Creatinine, Ser: 0.56 mg/dL (ref 0.44–1.00)
GFR calc Af Amer: >60 mL/min (ref >60)
GFR calc non Af Amer: >60 mL/min (ref >60)
Glucose, Bld: 156 mg/dL — ABNORMAL HIGH (ref 70–99)
Potassium: 3.7 mmol/L (ref 3.5–5.1)
Sodium: 146 mmol/L — ABNORMAL HIGH (ref 135–145)
Total Bilirubin: 0.3 mg/dL (ref 0.3–1.2)
Total Protein: 6.2 g/dL — ABNORMAL LOW (ref 6.5–8.1)

## 2019-06-30 LAB — CBC
HCT: 27 % — ABNORMAL LOW (ref 36.0–46.0)
Hemoglobin: 8.5 g/dL — ABNORMAL LOW (ref 12.0–15.0)
MCH: 27.9 pg (ref 26.0–34.0)
MCHC: 31.5 g/dL (ref 30.0–36.0)
MCV: 88.5 fL (ref 80.0–100.0)
Platelets: 226 10*3/uL (ref 150–400)
RBC: 3.05 MIL/uL — ABNORMAL LOW (ref 3.87–5.11)
RDW: 16 % — ABNORMAL HIGH (ref 11.5–15.5)
WBC: 18.7 10*3/uL — ABNORMAL HIGH (ref 4.0–10.5)
nRBC: 1.8 % — ABNORMAL HIGH (ref 0.0–0.2)

## 2019-06-30 LAB — GLUCOSE, CAPILLARY
Glucose-Capillary: 141 mg/dL — ABNORMAL HIGH (ref 70–99)
Glucose-Capillary: 194 mg/dL — ABNORMAL HIGH (ref 70–99)
Glucose-Capillary: 196 mg/dL — ABNORMAL HIGH (ref 70–99)
Glucose-Capillary: 211 mg/dL — ABNORMAL HIGH (ref 70–99)
Glucose-Capillary: 243 mg/dL — ABNORMAL HIGH (ref 70–99)
Glucose-Capillary: 257 mg/dL — ABNORMAL HIGH (ref 70–99)

## 2019-06-30 LAB — BPAM RBC
Blood Product Expiration Date: 202105182359
ISSUE DATE / TIME: 202104181105
Unit Type and Rh: 5100

## 2019-06-30 LAB — PHOSPHORUS: Phosphorus: 2.5 mg/dL (ref 2.5–4.6)

## 2019-06-30 LAB — MAGNESIUM: Magnesium: 2.2 mg/dL (ref 1.7–2.4)

## 2019-06-30 MED ORDER — BENZTROPINE MESYLATE 1 MG PO TABS
1.0000 mg | ORAL_TABLET | Freq: Two times a day (BID) | ORAL | Status: DC
  Administered 2019-06-30 – 2019-07-24 (×48): 1 mg via ORAL
  Filled 2019-06-30 (×50): qty 1

## 2019-06-30 MED ORDER — VALPROIC ACID 250 MG/5ML PO SOLN
250.0000 mg | Freq: Two times a day (BID) | ORAL | Status: DC
  Administered 2019-06-30 – 2019-07-24 (×48): 250 mg via ORAL
  Filled 2019-06-30 (×51): qty 5

## 2019-06-30 MED ORDER — HALOPERIDOL 5 MG PO TABS
10.0000 mg | ORAL_TABLET | Freq: Every morning | ORAL | Status: DC
  Administered 2019-07-01 – 2019-07-24 (×23): 10 mg via ORAL
  Filled 2019-06-30 (×25): qty 2

## 2019-06-30 MED ORDER — ACETAMINOPHEN 325 MG PO TABS: 650.0000 mg | ORAL_TABLET | ORAL | Status: DC | PRN

## 2019-06-30 MED ORDER — INSULIN ASPART 100 UNIT/ML ~~LOC~~ SOLN: 0.0000 [IU] | Freq: Every day | SUBCUTANEOUS | Status: DC

## 2019-06-30 MED ORDER — ENSURE MAX PROTEIN PO LIQD
11.0000 [oz_av] | Freq: Two times a day (BID) | ORAL | Status: DC
  Administered 2019-06-30 – 2019-07-01 (×2): 11 [oz_av] via ORAL
  Filled 2019-06-30 (×3): qty 330

## 2019-06-30 MED ORDER — POLYETHYLENE GLYCOL 3350 17 G PO PACK
17.0000 g | PACK | Freq: Every day | ORAL | Status: DC | PRN
  Filled 2019-06-30 (×2): qty 1

## 2019-06-30 MED ORDER — SODIUM CHLORIDE 0.9% FLUSH: 10.0000 mL | INTRAVENOUS | Status: DC | PRN

## 2019-06-30 MED ORDER — ADULT MULTIVITAMIN W/MINERALS CH
1.0000 | ORAL_TABLET | Freq: Every day | ORAL | Status: DC
  Administered 2019-06-30 – 2019-07-24 (×25): 1 via ORAL
  Filled 2019-06-30 (×25): qty 1

## 2019-06-30 MED ORDER — ZINC SULFATE 220 (50 ZN) MG PO CAPS
220.0000 mg | ORAL_CAPSULE | Freq: Every day | ORAL | Status: DC
  Administered 2019-07-01 – 2019-07-24 (×24): 220 mg via ORAL
  Filled 2019-06-30 (×25): qty 1

## 2019-06-30 MED ORDER — ASCORBIC ACID 500 MG PO TABS
500.0000 mg | ORAL_TABLET | Freq: Two times a day (BID) | ORAL | Status: DC
  Administered 2019-06-30 – 2019-07-23 (×46): 500 mg via ORAL
  Filled 2019-06-30 (×46): qty 1

## 2019-06-30 MED ORDER — INSULIN ASPART 100 UNIT/ML ~~LOC~~ SOLN
0.0000 [IU] | Freq: Three times a day (TID) | SUBCUTANEOUS | Status: DC
  Administered 2019-06-30: 5 [IU] via SUBCUTANEOUS
  Administered 2019-07-01: 1 [IU] via SUBCUTANEOUS
  Administered 2019-07-02 (×2): 3 [IU] via SUBCUTANEOUS
  Administered 2019-07-02 – 2019-07-03 (×2): 2 [IU] via SUBCUTANEOUS
  Administered 2019-07-03: 3 [IU] via SUBCUTANEOUS
  Administered 2019-07-03: 2 [IU] via SUBCUTANEOUS
  Administered 2019-07-04 – 2019-07-07 (×5): 1 [IU] via SUBCUTANEOUS
  Administered 2019-07-09: 2 [IU] via SUBCUTANEOUS
  Administered 2019-07-09: 1 [IU] via SUBCUTANEOUS
  Administered 2019-07-15: 2 [IU] via SUBCUTANEOUS
  Administered 2019-07-20: 1 [IU] via SUBCUTANEOUS
  Administered 2019-07-20: 2 [IU] via SUBCUTANEOUS
  Administered 2019-07-22: 5 [IU] via SUBCUTANEOUS
  Administered 2019-07-22: 1 [IU] via SUBCUTANEOUS
  Administered 2019-07-23: 2 [IU] via SUBCUTANEOUS

## 2019-06-30 MED ORDER — HALOPERIDOL 5 MG PO TABS
20.0000 mg | ORAL_TABLET | Freq: Every day | ORAL | Status: DC
  Administered 2019-06-30 – 2019-07-23 (×22): 20 mg via ORAL
  Filled 2019-06-30 (×26): qty 4

## 2019-06-30 MED ORDER — PANTOPRAZOLE SODIUM 40 MG PO PACK
40.0000 mg | PACK | Freq: Every day | ORAL | Status: DC
  Administered 2019-07-01 – 2019-07-09 (×9): 40 mg via ORAL
  Filled 2019-06-30 (×9): qty 20

## 2019-06-30 MED ORDER — JUVEN PO PACK
1.0000 | PACK | Freq: Two times a day (BID) | ORAL | Status: DC
  Administered 2019-07-01 – 2019-07-23 (×33): 1 via ORAL
  Filled 2019-06-30 (×36): qty 1

## 2019-06-30 NOTE — Progress Notes (Addendum)
4 Days Post-Op  Subjective: CC: Doing well. Reports she is eating some. Pain with dressing changes.   Objective: Vital signs in last 24 hours: Temp:  [98.8 F (37.1 C)-100.2 F (37.9 C)] 99.1 F (37.3 C) (04/19 0600) Pulse Rate:  [44-134] 44 (04/19 0600) Resp:  [14-34] 21 (04/19 0600) BP: (106-145)/(68-112) 122/72 (04/19 0300) SpO2:  [87 %-100 %] 87 % (04/19 0600) Weight:  [108.6 kg] 108.6 kg (04/19 0409) Last BM Date: 06/29/19  Intake/Output from previous day: 04/18 0701 - 04/19 0700 In: 1968 [P.O.:240; I.V.:362.7; NG/GT:165; IV Piggyback:1200.3] Out: 620 [Urine:620] Intake/Output this shift: No intake/output data recorded.  PE: Gen:  Alert, NAD, pleasant HEENT: EOM's intact, pupils equal and round Card:  Mild tachycardiac  Pulm:  CTAB, no W/R/R, effort normal Abd: Soft, NT other than over areas of wound  GU: Catheter in place with yellow urine in bag.  Wound: Please see pictures below with descriptions. Overall wound appears healthy. There is ~75% granulation tissue with some fibrinous tissue. Wound extends from the right lateral abdomen to the right thigh/groin and to the left abdomen. There is an area along the right lateral edge that has a small area of induration without warmth or overt erythema. Small amount of SS drainage from wound without purulence. Wound measures 45x30x7cm.     Right abdomen and thigh/groin  Right groin/thigh (base of picture) to right abdomen (superior aspect of wound). There is pink mixed with beefy red granulation tissue with some fibrinous tissue mixed. No puruelnce. Skin edges in picture healthy appearing. Foley in place as see in picture.   Above - Left abdomen. Healthy granulation tissue with some fibrinous tissue. No purulence. Skin edges appear healthy.   2cm of induration without warmth along wound edges. Wound tracks 7cm deep at this edge with some SS drainage. No purulence.     Lab Results:  Recent Labs    06/29/19 0633  06/30/19 0409  WBC 15.7* 18.7*  HGB 6.8* 8.5*  HCT 21.7* 27.0*  PLT 166 226   BMET Recent Labs    06/29/19 0633 06/30/19 0409  NA 148* 146*  K 3.4* 3.7  CL 115* 111  CO2 26 25  GLUCOSE 98 156*  BUN 20 19  CREATININE 0.62 0.56  CALCIUM 7.3* 7.7*   PT/INR No results for input(s): LABPROT, INR in the last 72 hours. CMP     Component Value Date/Time   NA 146 (H) 06/30/2019 0409   K 3.7 06/30/2019 0409   CL 111 06/30/2019 0409   CO2 25 06/30/2019 0409   GLUCOSE 156 (H) 06/30/2019 0409   BUN 19 06/30/2019 0409   CREATININE 0.56 06/30/2019 0409   CALCIUM 7.7 (L) 06/30/2019 0409   PROT 6.2 (L) 06/30/2019 0409   ALBUMIN 1.2 (L) 06/30/2019 0409   AST 30 06/30/2019 0409   ALT 29 06/30/2019 0409   ALKPHOS 129 (H) 06/30/2019 0409   BILITOT 0.3 06/30/2019 0409   GFRNONAA >60 06/30/2019 0409   GFRAA >60 06/30/2019 0409   Lipase  No results found for: LIPASE     Studies/Results: No results found.  Anti-infectives: Anti-infectives (From admission, onward)   Start     Dose/Rate Route Frequency Ordered Stop   06/26/19 1200  Vancomycin (VANCOCIN) 1,500 mg in sodium chloride 0.9 % 500 mL IVPB  Status:  Discontinued     1,500 mg 250 mL/hr over 120 Minutes Intravenous Every 24 hours 06/25/19 1153 06/26/19 0455   06/26/19 1200  vancomycin (VANCOREADY) IVPB  1500 mg/300 mL  Status:  Discontinued     1,500 mg 150 mL/hr over 120 Minutes Intravenous Every 24 hours 06/26/19 0455 06/26/19 0730   06/26/19 0830  vancomycin (VANCOREADY) IVPB 1500 mg/300 mL  Status:  Discontinued     1,000 mg 100 mL/hr over 120 Minutes Intravenous Every 12 hours 06/26/19 0730 06/26/19 0741   06/26/19 0830  vancomycin (VANCOCIN) IVPB 1000 mg/200 mL premix     1,000 mg 200 mL/hr over 60 Minutes Intravenous Every 12 hours 06/26/19 0742     06/25/19 1145  vancomycin (VANCOCIN) 500 mg in sodium chloride 0.9 % 100 mL IVPB     500 mg 100 mL/hr over 60 Minutes Intravenous NOW 06/25/19 1055 06/25/19 1241    06/24/19 1600  vancomycin (VANCOREADY) IVPB 750 mg/150 mL  Status:  Discontinued     750 mg 150 mL/hr over 60 Minutes Intravenous Every 12 hours 06/23/19 1700 06/24/19 0714   06/24/19 1600  vancomycin (VANCOCIN) IVPB 1000 mg/200 mL premix  Status:  Discontinued     1,000 mg 200 mL/hr over 60 Minutes Intravenous Every 12 hours 06/24/19 0714 06/24/19 0750   06/24/19 1400  clindamycin (CLEOCIN) IVPB 900 mg  Status:  Discontinued     900 mg 100 mL/hr over 30 Minutes Intravenous Every 8 hours 06/24/19 0756 06/29/19 1137   06/24/19 1145  ceFEPIme (MAXIPIME) 2 g in sodium chloride 0.9 % 100 mL IVPB  Status:  Discontinued     2 g 200 mL/hr over 30 Minutes Intravenous To ShortStay Surgical 06/24/19 1131 06/24/19 1457   06/24/19 1030  ceFEPIme (MAXIPIME) 2 g in sodium chloride 0.9 % 100 mL IVPB     2 g 200 mL/hr over 30 Minutes Intravenous Every 8 hours 06/24/19 0714     06/24/19 0800  vancomycin (VANCOCIN) IVPB 1000 mg/200 mL premix  Status:  Discontinued     1,000 mg 200 mL/hr over 60 Minutes Intravenous Every 12 hours 06/24/19 0750 06/25/19 1153   06/24/19 0300  ceFEPIme (MAXIPIME) 2 g in sodium chloride 0.9 % 100 mL IVPB  Status:  Discontinued     2 g 200 mL/hr over 30 Minutes Intravenous Every 12 hours 06/23/19 1700 06/24/19 0714   06/23/19 1700  clindamycin (CLEOCIN) IVPB 600 mg  Status:  Discontinued     600 mg 100 mL/hr over 30 Minutes Intravenous Every 8 hours 06/23/19 1649 06/24/19 0756   06/23/19 1500  vancomycin (VANCOREADY) IVPB 2000 mg/400 mL     2,000 mg 200 mL/hr over 120 Minutes Intravenous  Once 06/23/19 1454 06/23/19 1747   06/23/19 1445  ceFEPIme (MAXIPIME) 2 g in sodium chloride 0.9 % 100 mL IVPB     2 g 200 mL/hr over 30 Minutes Intravenous  Once 06/23/19 1434 06/23/19 1557   06/23/19 1445  metroNIDAZOLE (FLAGYL) IVPB 500 mg     500 mg 100 mL/hr over 60 Minutes Intravenous  Once 06/23/19 1434 06/23/19 1608   06/23/19 1445  vancomycin (VANCOCIN) IVPB 1000 mg/200 mL premix   Status:  Discontinued     1,000 mg 200 mL/hr over 60 Minutes Intravenous  Once 06/23/19 1434 06/23/19 1440   06/23/19 1445  vancomycin (VANCOCIN) 2,000 mg in sodium chloride 0.9 % 500 mL IVPB  Status:  Discontinued     2,000 mg 250 mL/hr over 120 Minutes Intravenous  Once 06/23/19 1440 06/23/19 1454       Assessment/Plan T2DMwith DKA - CBGsin the100-200sthis AM Acute renal failure - Crnormal Hypokalemia - K3.7 Schizophrenia  Bipolar disorder ABL anemia - hgb 8.5 this am s/p 1U 4/18 Septic shock - off levo Lactic acidosis   Necrotizing fasciitis of abdominal wall and groin S/P I&D 45 x 30 x 4 cm 06/24/19 and 4/15 by Dr. Rosendo Gros POD 6 & 4 -Tmax 100.2, WBC up to 18.7 - No current plans to take back to the OR at this time.  - Cont BID dressing changes at the bedside. - Cont abx. Cx's inconclusive.   FEN: D2 diet, would recommend d/c cortrak if tolerating diet - will leave decision per primary team VTE: SCDs, Lovenox  ID: flagyl 4/12; Clinda 4/13 - 4/18 . Cefepime/Vanc 4/12>>    LOS: 7 days    Jillyn Ledger , [facility] 06/30/2019, 7:57 AM Please see Amion for pager number during day hours 7:00am-4:30pm

## 2019-06-30 NOTE — Progress Notes (Signed)
PROGRESS NOTE  Kimberly Orozco  IWL:[id number] DOB: [birth year] DOA: 06/23/2019 PCP: Patient, No Pcp Per   Brief Narrative: Kimberly Orozco is a 44 y.o. female with a history of schizophrenia, bipolar disorder, obesity and T2DM who presented to the ED 4/12 with altered mental status found to be in DKA and with necrotizing soft tissue infection over lower abdominal wall and upper RLE extending into perineum. She was taken for extensive debridement by surgery on 4/13 and subsequently developed shock requiring pressors. Transient oliguria was noted, improved with fluid resuscitation. Taken back to ED for further debridement 4/15 and ultimately liberated from pressors and extubated 4/16. DKA has resolved and the patient has been transferred to hospitalist service on 4/18, to PCU 4/19.   Assessment & Plan: Active Problems:   DKA (diabetic ketoacidoses) (HCC)   Necrotizing cellulitis   Septic shock (HCC)   AKI (acute kidney injury) (HCC)   Acute respiratory failure (HCC)  Septic shock due to necrotizing soft tissue infection: Complicated by uncontrolled hyperglycemia due to diabetes. Blood cultures negative and wound cultures w/no dominant organism. - Continue IV antibiotics including vancomycin, cefepime. WBC up slightly. - Continue po and IV pain control - Wound care per general surgery. s/p large debridement (45cc x 30cm) on 4/13 and repeat debridement of 3-5cm additional tissue from bilateral edges 4/15.No current plans for repeat debridement. continue vitamin C, zinc, adequate nutrition.  - Volume status stable.  Acute blood loss anemia: Following extensive surgery, s/p 1u PRBCs - Monitor CBC daily.   Acute hypoxic respiratory failure: s/p ETT 4/13 - 4/16.  - Continue supplemental oxygen if needed to maintain SpO2 90%.   DKA, uncontrolled T2DM: DKA resolved. HbA1c 14.4%. - Continue basal-bolus insulin with levemir 30u BID and will switch to AC/HS SSI - Will benefit from diabetes education  once outside scope of such severe illness.  AKI: Resolved.   Poor IV access: And will require IV therapies.  - PICC placement requested  Hypokalemia:  - Supplement  Hypernatremia:  - Hydrate po, monitor.  Acute metabolic encephalopathy: Due to ICU delirium, shock, metabolic imbalances. - Delirium precautions, continue home psychotropic medications as below.  - Continue tube feeds.  Bipolar disorder, schizophrenia:  - Continue haldol, temazepam, divalproex and cogentin. Mentation improving.  - Note valproic acid level undetectable at admission.  Loose stools: In setting of tube feeds.  - DC clindamycin, monitor after discontinuation of tube feed.  - Abd benign. Will follow exam, fever curve, leukocytosis, and output.  - Can DC flexiseal if/when stools no longer loose.  RN Pressure Injury Documentation: Pressure Injury 06/23/19 Buttocks Left (Active)  06/23/19 1945  Location: Buttocks  Location Orientation: Left  Staging:   Wound Description (Comments):   Present on Admission: Yes    Obesity: Estimated body mass index is 37.5 kg/m as calculated from the following:   Height as of this encounter: 5\' 7"  (1.702 m).   Weight as of this encounter: 108.6 kg.  DVT prophylaxis: Lovenox Code Status: Full Family Communication: None at bedside Disposition Plan:  Status is: Inpatient  Remains inpatient appropriate because:Hemodynamically unstable, Persistent severe electrolyte disturbances, Altered mental status, Ongoing diagnostic testing needed not appropriate for outpatient work up, IV treatments appropriate due to intensity of illness or inability to take PO and Inpatient level of care appropriate due to severity of illness   Dispo: The patient is from: Home              Anticipated d/c is to: CIR  Anticipated d/c date is: > 3 days              Patient currently is not medically stable to d/c.  Consultants:   PCCM  General surgery  Procedures:   Central  line placement 4/14  ETT 4/13 - 4/16 06/24/19 IRRIGATION AND DEBRIDEMENT RIGHT GROIN AND RIGHT BUTTOCK Axel Filleramirez, Armando, MD  06/24/19 IRRIGATION AND DEBRIDEMENT OF [name] Axel Filleramirez, Armando, MD  06/26/19 IRRIGATION AND DEBRIDEMENT ABDOMINAL WALL AND GROIN       Antimicrobials: Metronidazole x1 (4/12) Cefepime (4/12) >> Vancomycin (4/12) >> Clindamycin (4/12) - 4/18  Subjective: Pain better controlled, wants cortrak out, passed swallow evaluation. No fevers, chest pain, dyspnea or abdominal pain.  Objective: Vitals:   06/30/19 0900 06/30/19 1008 06/30/19 1148 06/30/19 1630  BP:   (!) 142/80 121/81  Pulse: (!) 105  (!) 108 (!) 130  Resp: (!) 26  (!) 22 (!) 21  Temp: 98.8 F (37.1 C) 98.1 F (36.7 C) 98.3 F (36.8 C) 98.7 F (37.1 C)  TempSrc:  Oral Oral Oral  SpO2: 98%  97% 97%  Weight:      Height:        Intake/Output Summary (Last 24 hours) at 06/30/2019 1634 Last data filed at 06/30/2019 0945 Gross per 24 hour  Intake 1326.22 ml  Output 288 ml  Net 1038.22 ml   Filed Weights   06/27/19 0419 06/28/19 0500 06/30/19 0409  Weight: 124 kg 119.3 kg 108.6 kg   Gen: 44 y.o. female in no distress Pulm: Nonlabored breathing room air. Clear. CV: Regular rate and rhythm. No murmur, rub, or gallop. No JVD, no dependent edema. GI: Abdomen soft, non-tender, non-distended, with normoactive bowel sounds.  Ext: Warm, no deformities Skin: Extensive wounds documented in progress note by surgery during dressing change. Some induration at right lateral wound edge. No exudate noted. Neuro: Alert and oriented. No focal neurological deficits. Psych: Judgement and insight appear fair. Mood euthymic & affect slightly abnormal. No SI/HI.    Data Reviewed: I have personally reviewed following labs and imaging studies  CBC: Recent Labs  Lab 06/26/19 0425 06/27/19 0413 06/28/19 0431 06/29/19 0633 06/30/19 0409  WBC 23.3* 25.4* 17.6* 15.7* 18.7*  HGB 7.9* 7.7* 7.1* 6.8* 8.5*  HCT 24.3*  23.8* 22.4* 21.7* 27.0*  MCV 80.7 81.2 84.5 87.5 88.5  PLT 209 180 144* 166 226   Basic Metabolic Panel: Recent Labs  Lab 06/26/19 0425 06/26/19 0429 06/27/19 0413 06/27/19 0413 06/27/19 2030 06/28/19 0431 06/28/19 2044 06/29/19 0633 06/30/19 0409  NA  --    < > 143   < > 141 147* 143 148* 146*  K  --    < > 3.1*   < > 3.3* 3.3* 3.8 3.4* 3.7  CL  --    < > 110   < > 110 116* 114* 115* 111  CO2  --    < > 23   < > 23 25 25 26 25   GLUCOSE  --    < > 188*   < > 110* 102* 217* 98 156*  BUN  --    < > 15   < > 16 16 21* 20 19  CREATININE  --    < > 0.59   < > 0.55 0.56 0.67 0.62 0.56  CALCIUM  --    < > 7.1*   < > 6.9* 7.0* 6.9* 7.3* 7.7*  MG 1.7  --  1.6*  --   --  2.0  --  2.2 2.2  PHOS 1.8*  --  2.8  --   --  2.6  --  2.5 2.5   < > = values in this interval not displayed.   GFR: Estimated Creatinine Clearance: 113.9 mL/min (by C-G formula based on SCr of 0.56 mg/dL). Liver Function Tests: Recent Labs  Lab 06/30/19 0409  AST 30  ALT 29  ALKPHOS 129*  BILITOT 0.3  PROT 6.2*  ALBUMIN 1.2*   No results for input(s): LIPASE, AMYLASE in the last 168 hours. No results for input(s): AMMONIA in the last 168 hours. Coagulation Profile: No results for input(s): INR, PROTIME in the last 168 hours. Cardiac Enzymes: No results for input(s): CKTOTAL, CKMB, CKMBINDEX, TROPONINI in the last 168 hours. BNP (last 3 results) No results for input(s): PROBNP in the last 8760 hours. HbA1C: No results for input(s): HGBA1C in the last 72 hours. CBG: Recent Labs  Lab 06/29/19 2023 06/30/19 0019 06/30/19 0416 06/30/19 0801 06/30/19 1148  GLUCAP 230* 196* 141* 211* 243*   Lipid Profile: No results for input(s): CHOL, HDL, LDLCALC, TRIG, CHOLHDL, LDLDIRECT in the last 72 hours. Thyroid Function Tests: No results for input(s): TSH, T4TOTAL, FREET4, T3FREE, THYROIDAB in the last 72 hours. Anemia Panel: No results for input(s): VITAMINB12, FOLATE, FERRITIN, TIBC, IRON, RETICCTPCT in  the last 72 hours. Urine analysis:    Component Value Date/Time   COLORURINE YELLOW 06/23/2019 1715   APPEARANCEUR CLOUDY (A) 06/23/2019 1715   LABSPEC 1.028 06/23/2019 1715   PHURINE 6.0 06/23/2019 1715   GLUCOSEU >=500 (A) 06/23/2019 1715   HGBUR LARGE (A) 06/23/2019 1715   BILIRUBINUR NEGATIVE 06/23/2019 1715   KETONESUR 20 (A) 06/23/2019 1715   PROTEINUR 30 (A) 06/23/2019 1715   NITRITE NEGATIVE 06/23/2019 1715   LEUKOCYTESUR LARGE (A) 06/23/2019 1715   Recent Results (from the past 240 hour(s))  Blood Culture (routine x 2)     Status: None   Collection Time: 06/23/19  2:55 PM   Specimen: BLOOD  Result Value Ref Range Status   Specimen Description BLOOD LEFT ANTECUBITAL  Final   Special Requests   Final    BOTTLES DRAWN AEROBIC AND ANAEROBIC Blood Culture results may not be optimal due to an inadequate volume of blood received in culture bottles   Culture   Final    NO GROWTH 5 DAYS Performed at [facility], [address] [place], [place] [address]    Report Status 06/28/2019 FINAL  Final  Blood Culture (routine x 2)     Status: None   Collection Time: 06/23/19  3:00 PM   Specimen: BLOOD  Result Value Ref Range Status   Specimen Description BLOOD RIGHT ANTECUBITAL  Final   Special Requests   Final    BOTTLES DRAWN AEROBIC AND ANAEROBIC Blood Culture results may not be optimal due to an inadequate volume of blood received in culture bottles   Culture   Final    NO GROWTH 5 DAYS Performed at [facility], [address], [place], [place] [address]    Report Status 06/28/2019 FINAL  Final  Respiratory Panel by RT PCR (Flu A&B, Covid) - Nasopharyngeal Swab     Status: None   Collection Time: 06/23/19  3:21 PM   Specimen: Nasopharyngeal Swab  Result Value Ref Range Status   SARS Coronavirus 2 by RT PCR NEGATIVE NEGATIVE Final    Comment: (NOTE) SARS-CoV-2 target nucleic acids are NOT DETECTED. The SARS-CoV-2 RNA is generally detectable in upper  respiratoy specimens during the acute phase of infection. The lowest concentration of SARS-CoV-2 viral copies this assay can detect is 131 copies/mL. A negative result does not preclude SARS-Cov-2 infection and should not be used as the sole basis for treatment or other patient management decisions. A negative result may occur with  improper specimen collection/handling, submission of specimen other than nasopharyngeal swab, presence of viral mutation(s) within the areas targeted by this assay, and inadequate number of viral copies (<131 copies/mL). A negative result must be combined with clinical observations, patient history, and epidemiological information. The expected result is Negative. Fact Sheet for Patients:  PinkCheek.be Fact Sheet for Healthcare Providers:  GravelBags.it This test is not yet ap proved or cleared by the [place] FDA and  has been authorized for detection and/or diagnosis of SARS-CoV-2 by FDA under an Emergency Use Authorization (EUA). This EUA will remain  in effect (meaning this test can be used) for the duration of the COVID-19 declaration under Section 564(b)(1) of the Act, 21 U.S.C. section 360bbb-3(b)(1), unless the authorization is terminated or revoked sooner.    Influenza A by PCR NEGATIVE NEGATIVE Final   Influenza B by PCR NEGATIVE NEGATIVE Final    Comment: (NOTE) The Xpert Xpress SARS-CoV-2/FLU/RSV assay is intended as an aid in  the diagnosis of influenza from Nasopharyngeal swab specimens and  should not be used as a sole basis for treatment. Nasal washings and  aspirates are unacceptable for Xpert Xpress SARS-CoV-2/FLU/RSV  testing. Fact Sheet for Patients: PinkCheek.be Fact Sheet for Healthcare Providers: GravelBags.it This test is not yet approved or cleared by the [place] FDA and  has been authorized for  detection and/or diagnosis of SARS-CoV-2 by  FDA under an Emergency Use Authorization (EUA). This EUA will remain  in effect (meaning this test can be used) for the duration of the  Covid-19 declaration under Section 564(b)(1) of the Act, 21  U.S.C. section 360bbb-3(b)(1), unless the authorization is  terminated or revoked. Performed at [facility], [place] [address] [place], [place] [address]   Urine culture     Status: Abnormal   Collection Time: 06/23/19  5:20 PM   Specimen: In/Out Cath Urine  Result Value Ref Range Status   Specimen Description IN/OUT CATH URINE  Final   Special Requests   Final    NONE Performed at [facility], [place] [address] [place], [place] [address]    Culture MULTIPLE SPECIES PRESENT, SUGGEST RECOLLECTION (A)  Final   Report Status 06/25/2019 FINAL  Final  Surgical pcr screen     Status: Abnormal   Collection Time: 06/23/19  7:44 PM   Specimen: Nasal Mucosa; Nasal Swab  Result Value Ref Range Status   MRSA, PCR POSITIVE (A) NEGATIVE Final    Comment: RESULT CALLED TO, READ BACK BY AND VERIFIED WITH: J. PARRISH,CHARGE RN [id number] 06/23/2019 T. TYSOR    Staphylococcus aureus POSITIVE (A) NEGATIVE Final    Comment: (NOTE) The Xpert SA Assay (FDA approved for NASAL specimens in patients 48 years of age and older), is one component of a comprehensive surveillance program. It is not intended to diagnose infection nor to guide or monitor treatment. Performed at [facility], [place] [address] [place], [place] [address]   Aerobic/Anaerobic Culture (surgical/deep wound)     Status: None   Collection Time: 06/24/19 12:43 PM   Specimen: PATH Soft tissue  Result Value Ref Range Status   Specimen Description ABSCESS LABIA  Final  Special Requests PT ON CEFEPIME VANC  Final   Gram Stain   Final    RARE WBC PRESENT, PREDOMINANTLY PMN ABUNDANT GRAM POSITIVE RODS ABUNDANT GRAM NEGATIVE RODS MODERATE GRAM VARIABLE ROD FEW GRAM POSITIVE COCCI     Culture   Final    NORMAL VAGINAL FLORA NO ANAEROBES ISOLATED Performed at [facility], [address] [place], [place] [address]    Report Status 06/29/2019 FINAL  Final      Radiology Studies: No results found.  Scheduled Meds: . vitamin C  500 mg Per Tube BID  . benztropine  1 mg Per Tube BID  . chlorhexidine  15 mL Mouth Rinse BID  . Chlorhexidine Gluconate Cloth  6 each Topical Daily  . enoxaparin (LOVENOX) injection  40 mg Subcutaneous Q24H  . feeding supplement (PRO-STAT SUGAR FREE 64)  60 mL Oral TID  . Gerhardt's butt cream   Topical BID  . haloperidol  10 mg Per Tube q morning - 10a  . haloperidol  20 mg Per Tube QHS  . insulin aspart  2-6 Units Subcutaneous Q4H  . insulin detemir  30 Units Subcutaneous Q12H  . mouth rinse  15 mL Mouth Rinse q12n4p  . pantoprazole sodium  40 mg Per Tube Daily  . sodium chloride flush  10-40 mL Intracatheter Q12H  . valproic acid  250 mg Per Tube BID  . zinc sulfate  220 mg Per Tube Daily   Continuous Infusions: . sodium chloride 120 mL/hr at 06/29/19 1200  . ceFEPime (MAXIPIME) IV Stopped (06/30/19 0631)  . feeding supplement (PIVOT 1.5 CAL) 15 mL/hr at 06/28/19 0600  . vancomycin 200 mL/hr at 06/30/19 0900     LOS: 7 days   Time spent: 35 minutes.  Tyrone Nine, MD [facility] [email or web address] 06/30/2019, 4:34 PM

## 2019-06-30 NOTE — Progress Notes (Signed)
Physical Therapy Treatment Patient Details Name: Kimberly Orozco MRN: [id number] DOB: June 18, 1975 Today's Date: 06/30/2019    History of Present Illness 44 y/o female with a PMHx of T2DM, Schizophrenia, Bipolar Disorder who presents to the ED with c/o AMS and found to be in diabetic ketoacidosis complicated by necrotizing soft tissue infection.    PT Comments    Patient progressing this session to OOB to chair.  She was eager to get up initially and felt she could ambulate, but needed +2 for sit to stand from higher bed for three trials, then max of 2 to pivot to chair lowered to seat due to sitting prematurely.  Feel she remains appropriate for CIR level rehab as more alert, and aware and progressing mobility.  PT to continue to follow acutely.    Follow Up Recommendations  CIR     Equipment Recommendations  Other (comment)(TBA)    Recommendations for Other Services       Precautions / Restrictions Precautions Precautions: Fall Precaution Comments: flexiseal, coretrak    Mobility  Bed Mobility Overal bed mobility: Needs Assistance Bed Mobility: Supine to Sit     Supine to sit: Mod assist;+2 for safety/equipment;HOB elevated     General bed mobility comments: cues and assist for initiation, assist for lifting trunk, scooting to EOB  Transfers Overall transfer level: Needs assistance Equipment used: Rolling walker (2 wheeled) Transfers: Sit to/from [employer] Transfers   Stand pivot transfers: From elevated surface       General transfer comment: stood x 3 with mod to max A of 2 lifting help and use of momentum, assist for stand pivot to chair on third stand with pt sitting prematurely on edge of chair on drop arm recliner, assist to scoot back in chair  Ambulation/Gait             General Gait Details: unable   Stairs             Wheelchair Mobility    Modified Rankin (Stroke Patients Only)       Balance Overall balance assessment:  Needs assistance Sitting-balance support: Bilateral upper extremity supported Sitting balance-Leahy Scale: Fair     Standing balance support: Bilateral upper extremity supported Standing balance-Leahy Scale: Poor Standing balance comment: UE support and mod to min a of 2 for standing balance limited tolerance                            Cognition Arousal/Alertness: Awake/alert Behavior During Therapy: WFL for tasks assessed/performed;Restless Overall Cognitive Status: No family/caregiver present to determine baseline cognitive functioning                                 General Comments: follows commands well and speaking to MD appropriately, wants all lines off when he says she can get the coretrack out.and no realistic time frame for removal, wanting to put on her bra despite heart monitor on. etc      Exercises      General Comments General comments (skin integrity, edema, etc.): HR max up to 144 with activity; maintained in 120's throughout      Pertinent Vitals/Pain Pain Assessment: Faces Faces Pain Scale: Hurts little more Pain Location: generalized discomfort, buttocks Pain Descriptors / Indicators: Discomfort;Grimacing Pain Intervention(s): Monitored during session;Repositioned;Limited activity within patient's tolerance    Home Living  Prior Function            PT Goals (current goals can now be found in the care plan section) Progress towards PT goals: Progressing toward goals    Frequency    Min 3X/week      PT Plan Current plan remains appropriate    Co-evaluation              AM-PAC PT "6 Clicks" Mobility   Outcome Measure  Help needed turning from your back to your side while in a flat bed without using bedrails?: A Lot Help needed moving from lying on your back to sitting on the side of a flat bed without using bedrails?: A Lot Help needed moving to and from a bed to a chair (including  a wheelchair)?: Total Help needed standing up from a chair using your arms (e.g., wheelchair or bedside chair)?: A Lot Help needed to walk in hospital room?: Total Help needed climbing 3-5 steps with a railing? : Total 6 Click Score: 9    End of Session Equipment Utilized During Treatment: Gait belt Activity Tolerance: Patient limited by fatigue Patient left: in chair;with call bell/phone within reach;with chair alarm set Nurse Communication: Mobility status PT Visit Diagnosis: Other abnormalities of gait and mobility (R26.89);Muscle weakness (generalized) (M62.81);Pain Pain - part of body: (abdomen)     Time: 9449-6759 PT Time Calculation (min) (ACUTE ONLY): 34 min  Charges:  $Therapeutic Activity: 23-37 mins                     Sheran Lawless, [facility] [phone] 06/30/2019    Kimberly Orozco 06/30/2019, 3:57 PM

## 2019-06-30 NOTE — Progress Notes (Signed)
  Speech Language Pathology Treatment: Dysphagia  Patient Details Name: Kimberly Orozco MRN: [id number] DOB: 04-07-1975 Today's Date: 06/30/2019 Time: 8325-4982 SLP Time Calculation (min) (ACUTE ONLY): 8 min  Assessment / Plan / Recommendation Clinical Impression  Kimberly Orozco seen for potential improvements in oral efficiency with mastication and appropriateness for texture upgrade. She verbalizes dislike of the food- not necessarily the texture. Continues to have differences when masticating with open mouth posture but was within functional limits, no oral residue. Cough x1 after cracker. No s/s aspiration with liquid. Recommend texture upgrade to regular, continue thin liquids and no further ST needed.    HPI HPI: Patient is a 44 y/o female with a PMHx of T2DM, Schizophrenia, Bipolar Disorder who presents to the ED with c/o AMS and found to be in diabetic ketoacidosis complicated by necrotizing soft tissue infection. Intubated 4/13-4/16 and underwent debridement for soft tissue infection.       SLP Plan  All goals met       Recommendations  Diet recommendations: Regular;Thin liquid Liquids provided via: Cup;Straw Medication Administration: Whole meds with liquid Supervision: Patient able to self feed Compensations: Small sips/bites;Slow rate Postural Changes and/or Swallow Maneuvers: Seated upright 90 degrees                Oral Care Recommendations: Oral care BID Follow up Recommendations: None SLP Visit Diagnosis: Dysphagia, unspecified (R13.10) Plan: All goals met                       Houston Siren 06/30/2019, 3:40 PM  Orbie Pyo Colvin Caroli.Ed Risk analyst [phone] Office [phone]

## 2019-06-30 NOTE — Progress Notes (Signed)
Nutrition Follow-up  RD working remotely.  DOCUMENTATION CODES:   Obesity unspecified  INTERVENTION:   -D/c Prostat -1 packet Juven BID, each packet provides 95 calories, 2.5 grams of protein (collagen), and 9.8 grams of carbohydrate (3 grams sugar); also contains 7 grams of L-arginine and L-glutamine, 300 mg vitamin C, 15 mg vitamin E, 1.2 mcg vitamin B-12, 9.5 mg zinc, 200 mg calcium, and 1.5 g  Calcium Beta-hydroxy-Beta-methylbutyrate to support wound healing -Ensure Max po BID, each supplement provides 150 kcal and 30 grams of protein -MVI with minerals daily -Magic cup TID with meals, each supplement provides 290 kcal and 9 grams of protein -Hormel shake TID with meals, each supplement provides 520 kcals and 15 grams protein -Initiate 48 hour calorie count  NUTRITION DIAGNOSIS:   Increased nutrient needs related to wound healing, acute illness as evidenced by estimated needs.  Ongoing  GOAL:   Patient will meet greater than or equal to 90% of their needs  Progressing   MONITOR:   PO intake, Supplement acceptance, Labs, Weight trends, Skin, I & O's  REASON FOR ASSESSMENT:   Consult, Ventilator Enteral/tube feeding initiation and management  ASSESSMENT:   44 yo female admitted with DKA, necrotizing soft tissue infection of lower abdominal wall and upper thigh extending to perineum, septic shock. PMH includes DM, bipolar disorder, schizophrenia  4/16- extubated 4/17- s/p BSE- advanced to dysphagia 2 diet with thin liquids, rectal tube inserted due to loosr stools 4/19- TF (Pivot) d/c, s/p BSE- advanced to regular diet  Reviewed I/O's: +1.5 L x 24 hours and +19 .7 L since admission  UOP: 620 ml x 24 hours  Attempted to speak with pt via phone, however, no answer.   Pt advanced to a regular diet today. She is tolerating regular diet well, however, minimal intake, as she does not like the food (PO 15-50%). Cortrak tube still in place; suspect pt will likely need  supplemental nutrition support due to increased nutritional needs for necrotizing fascitis.   Labs reviewed: CBGS: 141-257 (inpatient orders for glycemic control are 0-5 units insulin aspart q HS and  0-9 units insulin aspart TID with meals).   Diet Order:   Diet Order            Diet regular Room service appropriate? Yes with Assist; Fluid consistency: Thin  Diet effective now              EDUCATION NEEDS:   Not appropriate for education at this time  Skin:  Skin Assessment: Skin Integrity Issues: Skin Integrity Issues:: Unstageable, Other (Comment) Unstageable: buttocks Other: necrotizing wound to lower abdomial wall and upper thigh extending to perineum requiring debridments  Last BM:  06/29/19  Height:   Ht Readings from Last 1 Encounters:  06/23/19 5\' 7"  (1.702 m)    Weight:   Wt Readings from Last 1 Encounters:  06/30/19 108.6 kg   BMI:  Body mass index is 37.5 kg/m.  Estimated Nutritional Needs:   Kcal:  2400-2700 kcals  Protein:  120-150 g  Fluid:  >/= 2 L    07/02/19, RD, LDN, CDCES Registered Dietitian II Certified Diabetes Care and Education Specialist Please refer to [facility] for RD and/or RD on-call/weekend/after hours pager

## 2019-06-30 NOTE — Progress Notes (Signed)
Peripherally Inserted Central Catheter Placement  The IV Nurse has discussed with the patient and/or persons authorized to consent for the patient, the purpose of this procedure and the potential benefits and risks involved with this procedure.  The benefits include less needle sticks, lab draws from the catheter, and the patient may be discharged home with the catheter. Risks include, but not limited to, infection, bleeding, blood clot (thrombus formation), and puncture of an artery; nerve damage and irregular heartbeat and possibility to perform a PICC exchange if needed/ordered by physician.  Alternatives to this procedure were also discussed.  Bard Power PICC patient education guide, fact sheet on infection prevention and patient information card has been provided to patient /or left at bedside.    PICC Placement Documentation  PICC Single Lumen 06/30/19 PICC Left Brachial 41 cm 0 cm (Active)  Indication for Insertion or Continuance of Line Prolonged intravenous therapies 06/30/19 1853  Exposed Catheter (cm) 0 cm 06/30/19 1853  Site Assessment Clean;Dry;Intact 06/30/19 1853  Line Status Flushed;Blood return noted;Saline locked 06/30/19 1853  Dressing Type Transparent 06/30/19 1853  Dressing Status Clean;Dry;Intact;Antimicrobial disc in place 06/30/19 1853  Dressing Change Due 07/07/19 06/30/19 1853   PICC signed by father via phone    Audrie Gallus 06/30/2019, 6:57 PM

## 2019-07-01 LAB — BASIC METABOLIC PANEL
Anion gap: 9 (ref 5–15)
BUN: 11 mg/dL (ref 6–20)
CO2: 26 mmol/L (ref 22–32)
Calcium: 7.7 mg/dL — ABNORMAL LOW (ref 8.9–10.3)
Chloride: 106 mmol/L (ref 98–111)
Creatinine, Ser: 0.48 mg/dL (ref 0.44–1.00)
GFR calc Af Amer: >60 mL/min (ref >60)
GFR calc non Af Amer: >60 mL/min (ref >60)
Glucose, Bld: 96 mg/dL (ref 70–99)
Potassium: 3.5 mmol/L (ref 3.5–5.1)
Sodium: 141 mmol/L (ref 135–145)

## 2019-07-01 LAB — GLUCOSE, CAPILLARY
Glucose-Capillary: 87 mg/dL (ref 70–99)
Glucose-Capillary: 104 mg/dL — ABNORMAL HIGH (ref 70–99)
Glucose-Capillary: 129 mg/dL — ABNORMAL HIGH (ref 70–99)
Glucose-Capillary: 157 mg/dL — ABNORMAL HIGH (ref 70–99)

## 2019-07-01 LAB — CBC
HCT: 26.1 % — ABNORMAL LOW (ref 36.0–46.0)
Hemoglobin: 8.1 g/dL — ABNORMAL LOW (ref 12.0–15.0)
MCH: 27.3 pg (ref 26.0–34.0)
MCHC: 31 g/dL (ref 30.0–36.0)
MCV: 87.9 fL (ref 80.0–100.0)
Platelets: 229 10*3/uL (ref 150–400)
RBC: 2.97 MIL/uL — ABNORMAL LOW (ref 3.87–5.11)
RDW: 16.1 % — ABNORMAL HIGH (ref 11.5–15.5)
WBC: 19.5 10*3/uL — ABNORMAL HIGH (ref 4.0–10.5)
nRBC: 1.4 % — ABNORMAL HIGH (ref 0.0–0.2)

## 2019-07-01 MED ORDER — PRO-STAT SUGAR FREE PO LIQD
30.0000 mL | Freq: Three times a day (TID) | ORAL | Status: DC
  Administered 2019-07-01 – 2019-07-07 (×14): 30 mL via ORAL
  Filled 2019-07-01 (×14): qty 30

## 2019-07-01 MED ORDER — MUPIROCIN 2 % EX OINT
1.0000 "application " | TOPICAL_OINTMENT | Freq: Two times a day (BID) | CUTANEOUS | Status: AC
  Administered 2019-07-01 – 2019-07-05 (×10): 1 via NASAL
  Filled 2019-07-01 (×2): qty 22

## 2019-07-01 MED ORDER — BOOST / RESOURCE BREEZE PO LIQD CUSTOM
1.0000 | Freq: Three times a day (TID) | ORAL | Status: DC
  Administered 2019-07-01 – 2019-07-12 (×19): 1 via ORAL

## 2019-07-01 NOTE — Progress Notes (Signed)
Calorie Count Note  48 hour calorie count ordered.  Diet: Regular diet with thin liquids.  Supplements:   Ensure Max po BID, each supplement provides 150 kcal and 30 grams of protein.   Juven BID, each packet provides 95 calories, 2.5 grams of protein.  Magic cup TID with meals, each supplement provides 290 kcal and 9 grams of protein  Hormel shake TID with meals, each supplement provides 520 kcals and 15 grams protein  Breakfast: 674 kcal, 28 grams of protein Lunch: 324 kcal, 11 grams of protein Dinner: no information provided.  Supplements: 630 kcal and 39 grams of protein  Day 1 Total intake: 1628 kcal (68% of kcal needs)  78 grams of protein (65% of protein needs)  Estimated Nutritional Needs:  Kcal:  2400-2700 kcals Protein:  120-150 grams Fluid:  >/= 2 L  Vitamin C lab low at 0.3. Zinc lab low at 41. Vitamin C 500 mg BID and zinc sulfate capsule 220 ml once daily ordered. Cortrak NGT removed yesterday. Pt continues on regular diet with thin liquids. Pt po intake at meals have been increasing. RN reports pt will consume most to all of her meal if she is presented with foods she prefers to eat. Pt reports Ensure causes abdominal discomfort and diarrhea. RD to order Boost Breeze and Prostat instead to aid in increased nutrition needs as well as in healing. Pt encouraged to eat her food at meals and to consume her supplements.   Nutrition Dx:  Increased nutrient needs related to wound healing, acute illness as evidenced by estimated needs; ongoing  Goal:  Pt to meet >/= 90% of their estimated nutrition needs; progressing  Intervention:   Provide Boost Breeze po TID, each supplement provides 250 kcal and 9 grams of protein  Provide 30 ml Prostat po TID, each supplement provides 100 kcal and 15 grams of protein.   Continue Juven BID, each packet provides 95 calories, 2.5 grams of protein.  Continue Magic cup TID with meals, each supplement provides 290 kcal and 9 grams of  protein  RD to follow up tomorrow with final calorie count results.   Roslyn Smiling, MS, RD, LDN RD pager number/after hours weekend pager number on Amion.

## 2019-07-01 NOTE — Progress Notes (Signed)
PROGRESS NOTE  [name]  ZOX:[id number] DOB: Jun 20, 1975 DOA: 06/23/2019 PCP: Patient, No Pcp Per   Brief Narrative: Kimberly Orozco is a 44 y.o. female with a history of schizophrenia, bipolar disorder, obesity and T2DM who presented to the ED 4/12 with altered mental status found to be in DKA and with necrotizing soft tissue infection over lower abdominal wall and upper RLE extending into perineum. She was taken for extensive debridement by surgery on 4/13 and subsequently developed shock requiring pressors. Transient oliguria was noted, improved with fluid resuscitation. Taken back to ED for further debridement 4/15 and ultimately liberated from pressors and extubated 4/16. DKA has resolved and the patient has been transferred to hospitalist service on 4/18, to PCU 4/19.   Assessment & Plan: Active Problems:   DKA (diabetic ketoacidoses) (HCC)   Necrotizing cellulitis   Septic shock ([place])   AKI (acute kidney injury) ([place])   Acute respiratory failure ([place])  Septic shock due to necrotizing soft tissue infection: Complicated by uncontrolled hyperglycemia due to diabetes. Blood cultures negative and wound cultures w/no dominant organism. - Continue IV antibiotics including vancomycin, cefepime. WBC up slightly again though afebrile. Cultures unhelpful. - Continue po and IV pain control - Wound care per general surgery. s/p large debridement (45cc x 30cm) on 4/13 and repeat debridement of 3-5cm additional tissue from bilateral edges 4/15.No current plans for repeat debridement, plastics consulted. Continue vitamin C, zinc, adequate nutrition.  - Volume status stable.  Acute blood loss anemia: Following extensive surgery, s/p 1u PRBCs - Monitor CBC daily as hgb is trending back downward  Acute hypoxic respiratory failure: s/p ETT 4/13 - 4/16.  - Continue supplemental oxygen if needed to maintain SpO2 90%.   DKA, uncontrolled T2DM: DKA resolved. HbA1c 14.4%. - Continue basal-bolus  insulin with levemir 30u BID and will switch to AC/HS SSI - Will benefit from diabetes education once outside scope of such severe illness.  AKI: Resolved.   Poor IV access: And will require IV therapies.  - PICC left brachial 4/19  Hypokalemia:  - Supplement  Hypernatremia:  - Hydrate po, monitor.  Acute metabolic encephalopathy: Due to ICU delirium, shock, metabolic imbalances. - Delirium precautions, continue home psychotropic medications as below.  - Continue tube feeds.  Bipolar disorder, schizophrenia:  - Continue haldol, temazepam, divalproex and cogentin. Mentation improving.  - Note valproic acid level undetectable at admission.  Loose stools: In setting of tube feeds.  - DCed clindamycin - Abd benign. Will follow exam, fever curve, leukocytosis, and output.  - Can DC flexiseal if/when stools no longer loose, continues to need this.  RN Pressure Injury Documentation: Pressure Injury 06/23/19 Buttocks Left (Active)  06/23/19 1945  Location: Buttocks  Location Orientation: Left  Staging:   Wound Description (Comments):   Present on Admission: Yes    Obesity: Estimated body mass index is 41.88 kg/m as calculated from the following:   Height as of this encounter: 5\' 7"  (1.702 m).   Weight as of this encounter: 121.3 kg.  DVT prophylaxis: Lovenox Code Status: Full Family Communication: None at bedside. Mother by phone today Disposition Plan:  Status is: Inpatient  Remains inpatient appropriate because:Hemodynamically unstable, Persistent severe electrolyte disturbances, Altered mental status, Ongoing diagnostic testing needed not appropriate for outpatient work up, IV treatments appropriate due to intensity of illness or inability to take PO and Inpatient level of care appropriate due to severity of illness   Dispo: The patient is from: Home  Anticipated d/c is to: CIR              Anticipated d/c date is: > 3 days              Patient currently  is not medically stable to d/c.  Consultants:   PCCM  General surgery  Plastic surgery  Procedures:   Central line placement 4/14  ETT 4/13 - 4/16 06/24/19 IRRIGATION AND DEBRIDEMENT RIGHT GROIN AND RIGHT BUTTOCK Axel Filleramirez, Armando, MD  06/24/19 IRRIGATION AND DEBRIDEMENT OF [name] Axel Filleramirez, Armando, MD  06/26/19 IRRIGATION AND DEBRIDEMENT ABDOMINAL WALL AND GROIN       Antimicrobials: Metronidazole x1 (4/12) Cefepime (4/12) >> Vancomycin (4/12) >> Clindamycin (4/12) - 4/18  Subjective: No complaints currently. Got PICC placed, cortrak out, eating ok. Pain controlled. No fever.  Objective: Vitals:   07/01/19 0426 07/01/19 0500 07/01/19 0800 07/01/19 1200  BP: 129/82     Pulse: (!) 105     Resp: 20     Temp: 97.7 F (36.5 C)  98.6 F (37 C) 97.8 F (36.6 C)  TempSrc: Oral  Axillary Oral  SpO2: 98%     Weight: 102.4 kg 121.3 kg    Height:        Intake/Output Summary (Last 24 hours) at 07/01/2019 1601 Last data filed at 07/01/2019 1300 Gross per 24 hour  Intake 971.37 ml  Output 1450 ml  Net -478.63 ml   Filed Weights   06/30/19 0409 07/01/19 0426 07/01/19 0500  Weight: 108.6 kg 102.4 kg 121.3 kg   Gen: 44 y.o. female in no distress Pulm: Nonlabored breathing room air. Clear. CV: Regular rate and rhythm. No murmur, rub, or gallop. UTD JVD, no pitting dependent edema. GI: Abdomen soft, obese, non-tender, non-distended, with normoactive bowel sounds.  Ext: Warm, no deformities Skin: Large dressing c/d/i, not uncovered today. No other new rashes, lesions or ulcers on visualized skin. Neuro: Alert and oriented. No focal neurological deficits. Psych: Judgement and insight appear fair, abnormal affect, no AVH.    Data Reviewed: I have personally reviewed following labs and imaging studies  CBC: Recent Labs  Lab 06/27/19 0413 06/28/19 0431 06/29/19 0633 06/30/19 0409 07/01/19 0517  WBC 25.4* 17.6* 15.7* 18.7* 19.5*  HGB 7.7* 7.1* 6.8* 8.5* 8.1*  HCT 23.8*  22.4* 21.7* 27.0* 26.1*  MCV 81.2 84.5 87.5 88.5 87.9  PLT 180 144* 166 226 229   Basic Metabolic Panel: Recent Labs  Lab 06/26/19 0425 06/26/19 0429 06/27/19 0413 06/27/19 2030 06/28/19 0431 06/28/19 2044 06/29/19 0633 06/30/19 0409 07/01/19 0517  NA  --    < > 143   < > 147* 143 148* 146* 141  K  --    < > 3.1*   < > 3.3* 3.8 3.4* 3.7 3.5  CL  --    < > 110   < > 116* 114* 115* 111 106  CO2  --    < > 23   < > 25 25 26 25 26   GLUCOSE  --    < > 188*   < > 102* 217* 98 156* 96  BUN  --    < > 15   < > 16 21* 20 19 11   CREATININE  --    < > 0.59   < > 0.56 0.67 0.62 0.56 0.48  CALCIUM  --    < > 7.1*   < > 7.0* 6.9* 7.3* 7.7* 7.7*  MG 1.7  --  1.6*  --  2.0  --  2.2 2.2  --   PHOS 1.8*  --  2.8  --  2.6  --  2.5 2.5  --    < > = values in this interval not displayed.   GFR: Estimated Creatinine Clearance: 121.1 mL/min (by C-G formula based on SCr of 0.48 mg/dL). Liver Function Tests: Recent Labs  Lab 06/30/19 0409  AST 30  ALT 29  ALKPHOS 129*  BILITOT 0.3  PROT 6.2*  ALBUMIN 1.2*   No results for input(s): LIPASE, AMYLASE in the last 168 hours. No results for input(s): AMMONIA in the last 168 hours. Coagulation Profile: No results for input(s): INR, PROTIME in the last 168 hours. Cardiac Enzymes: No results for input(s): CKTOTAL, CKMB, CKMBINDEX, TROPONINI in the last 168 hours. BNP (last 3 results) No results for input(s): PROBNP in the last 8760 hours. HbA1C: No results for input(s): HGBA1C in the last 72 hours. CBG: Recent Labs  Lab 06/30/19 1148 06/30/19 1644 06/30/19 1948 07/01/19 0740 07/01/19 1135  GLUCAP 243* 257* 194* 87 104*   Lipid Profile: No results for input(s): CHOL, HDL, LDLCALC, TRIG, CHOLHDL, LDLDIRECT in the last 72 hours. Thyroid Function Tests: No results for input(s): TSH, T4TOTAL, FREET4, T3FREE, THYROIDAB in the last 72 hours. Anemia Panel: No results for input(s): VITAMINB12, FOLATE, FERRITIN, TIBC, IRON, RETICCTPCT in the  last 72 hours. Urine analysis:    Component Value Date/Time   COLORURINE YELLOW 06/23/2019 1715   APPEARANCEUR CLOUDY (A) 06/23/2019 1715   LABSPEC 1.028 06/23/2019 1715   PHURINE 6.0 06/23/2019 1715   GLUCOSEU >=500 (A) 06/23/2019 1715   HGBUR LARGE (A) 06/23/2019 1715   BILIRUBINUR NEGATIVE 06/23/2019 1715   KETONESUR 20 (A) 06/23/2019 1715   PROTEINUR 30 (A) 06/23/2019 1715   NITRITE NEGATIVE 06/23/2019 1715   LEUKOCYTESUR LARGE (A) 06/23/2019 1715   Recent Results (from the past 240 hour(s))  Blood Culture (routine x 2)     Status: None   Collection Time: 06/23/19  2:55 PM   Specimen: BLOOD  Result Value Ref Range Status   Specimen Description BLOOD LEFT ANTECUBITAL  Final   Special Requests   Final    BOTTLES DRAWN AEROBIC AND ANAEROBIC Blood Culture results may not be optimal due to an inadequate volume of blood received in culture bottles   Culture   Final    NO GROWTH 5 DAYS Performed at [facility], [address] [place], [place] [address]    Report Status 06/28/2019 FINAL  Final  Blood Culture (routine x 2)     Status: None   Collection Time: 06/23/19  3:00 PM   Specimen: BLOOD  Result Value Ref Range Status   Specimen Description BLOOD RIGHT ANTECUBITAL  Final   Special Requests   Final    BOTTLES DRAWN AEROBIC AND ANAEROBIC Blood Culture results may not be optimal due to an inadequate volume of blood received in culture bottles   Culture   Final    NO GROWTH 5 DAYS Performed at [facility], [address], [place], [place] [address]    Report Status 06/28/2019 FINAL  Final  Respiratory Panel by RT PCR (Flu A&B, Covid) - Nasopharyngeal Swab     Status: None   Collection Time: 06/23/19  3:21 PM   Specimen: Nasopharyngeal Swab  Result Value Ref Range Status   SARS Coronavirus 2 by RT PCR NEGATIVE NEGATIVE Final    Comment: (NOTE) SARS-CoV-2 target nucleic acids are NOT DETECTED. The SARS-CoV-2  RNA is generally detectable in upper  respiratoy specimens during the acute phase of infection. The lowest concentration of SARS-CoV-2 viral copies this assay can detect is 131 copies/mL. A negative result does not preclude SARS-Cov-2 infection and should not be used as the sole basis for treatment or other patient management decisions. A negative result may occur with  improper specimen collection/handling, submission of specimen other than nasopharyngeal swab, presence of viral mutation(s) within the areas targeted by this assay, and inadequate number of viral copies (<131 copies/mL). A negative result must be combined with clinical observations, patient history, and epidemiological information. The expected result is Negative. Fact Sheet for Patients:  https://www.moore.com/ Fact Sheet for Healthcare Providers:  https://www.young.biz/ This test is not yet ap proved or cleared by the [place] FDA and  has been authorized for detection and/or diagnosis of SARS-CoV-2 by FDA under an Emergency Use Authorization (EUA). This EUA will remain  in effect (meaning this test can be used) for the duration of the COVID-19 declaration under Section 564(b)(1) of the Act, 21 U.S.C. section 360bbb-3(b)(1), unless the authorization is terminated or revoked sooner.    Influenza A by PCR NEGATIVE NEGATIVE Final   Influenza B by PCR NEGATIVE NEGATIVE Final    Comment: (NOTE) The Xpert Xpress SARS-CoV-2/FLU/RSV assay is intended as an aid in  the diagnosis of influenza from Nasopharyngeal swab specimens and  should not be used as a sole basis for treatment. Nasal washings and  aspirates are unacceptable for Xpert Xpress SARS-CoV-2/FLU/RSV  testing. Fact Sheet for Patients: https://www.moore.com/ Fact Sheet for Healthcare Providers: https://www.young.biz/ This test is not yet approved or cleared by the [place] FDA and  has been authorized for  detection and/or diagnosis of SARS-CoV-2 by  FDA under an Emergency Use Authorization (EUA). This EUA will remain  in effect (meaning this test can be used) for the duration of the  Covid-19 declaration under Section 564(b)(1) of the Act, 21  U.S.C. section 360bbb-3(b)(1), unless the authorization is  terminated or revoked. Performed at [facility], [address], [place], [place] [address]   Urine culture     Status: Abnormal   Collection Time: 06/23/19  5:20 PM   Specimen: In/Out Cath Urine  Result Value Ref Range Status   Specimen Description IN/OUT CATH URINE  Final   Special Requests   Final    NONE Performed at [facility], [address] [place], [place] [address]    Culture MULTIPLE SPECIES PRESENT, SUGGEST RECOLLECTION (A)  Final   Report Status 06/25/2019 FINAL  Final  Surgical pcr screen     Status: Abnormal   Collection Time: 06/23/19  7:44 PM   Specimen: Nasal Mucosa; Nasal Swab  Result Value Ref Range Status   MRSA, PCR POSITIVE (A) NEGATIVE Final    Comment: RESULT CALLED TO, READ BACK BY AND VERIFIED WITH: J. PARRISH,CHARGE RN [id number] 06/23/2019 T. TYSOR    Staphylococcus aureus POSITIVE (A) NEGATIVE Final    Comment: (NOTE) The Xpert SA Assay (FDA approved for NASAL specimens in patients 55 years of age and older), is one component of a comprehensive surveillance program. It is not intended to diagnose infection nor to guide or monitor treatment. Performed at [facility], [address] [place], [place] [address]   Aerobic/Anaerobic Culture (surgical/deep wound)     Status: None   Collection Time: 06/24/19 12:43 PM   Specimen: PATH Soft tissue  Result Value Ref Range Status   Specimen  Description ABSCESS LABIA  Final   Special Requests PT ON CEFEPIME VANC  Final   Gram Stain   Final    RARE WBC PRESENT, PREDOMINANTLY PMN ABUNDANT GRAM POSITIVE RODS ABUNDANT GRAM NEGATIVE RODS MODERATE GRAM VARIABLE ROD FEW GRAM POSITIVE COCCI     Culture   Final    NORMAL VAGINAL FLORA NO ANAEROBES ISOLATED Performed at [facility], [address], [place], [place] [address]    Report Status 06/29/2019 FINAL  Final      Radiology Studies: [place] EKG SITE RITE  Result Date: 06/30/2019 If Site Rite image not attached, placement could not be confirmed due to current cardiac rhythm.   Scheduled Meds: . vitamin C  500 mg Oral BID  . benztropine  1 mg Oral BID  . chlorhexidine  15 mL Mouth Rinse BID  . Chlorhexidine Gluconate Cloth  6 each Topical Daily  . enoxaparin (LOVENOX) injection  40 mg Subcutaneous Q24H  . feeding supplement  1 Container Oral TID BM  . feeding supplement (PRO-STAT SUGAR FREE 64)  30 mL Oral TID BM  . Gerhardt's butt cream   Topical BID  . haloperidol  10 mg Oral q morning - 10a  . haloperidol  20 mg Oral QHS  . insulin aspart  0-5 Units Subcutaneous QHS  . insulin aspart  0-9 Units Subcutaneous TID WC  . mouth rinse  15 mL Mouth Rinse q12n4p  . multivitamin with minerals  1 tablet Oral Daily  . mupirocin ointment  1 application Nasal BID  . nutrition supplement (JUVEN)  1 packet Oral BID BM  . pantoprazole sodium  40 mg Oral Daily  . sodium chloride flush  10-40 mL Intracatheter Q12H  . valproic acid  250 mg Oral BID  . zinc sulfate  220 mg Oral Daily   Continuous Infusions: . sodium chloride 10 mL/hr at 07/01/19 0546  . ceFEPime (MAXIPIME) IV 2 g (07/01/19 1208)  . vancomycin 1,000 mg (07/01/19 0912)     LOS: 8 days   Time spent: 35 minutes.  Tyrone Nine, MD [facility] [email or web address] 07/01/2019, 4:01 PM

## 2019-07-01 NOTE — Progress Notes (Signed)
Pharmacy Antibiotic Note  Kimberly Orozco is a 44 y.o. female admitted on 06/23/2019 with sepsis and necrotizing soft tissue infection. S/p extensive debridement 4/13 and 4/15. Pharmacy has been consulted for Vancomycin and Cefepime dosing.  Day # 9 antibiotics.Transferred out of ICU on 4/19.  Last Vanc trough level was 14 mcg/ml on 4/17. Acceptable. Renal function stable.   Plan:  Continue Vancomycin 1 gm IV q12h  Continue Cefepime 2gm IV q8h  Monitor renal function, clinical progress, antibiotic plans.  Vancomycin levels as indicated, currently planning weekly trough level.  Height: 5\' 7"  (170.2 cm) Weight: 121.3 kg (267 lb 6.7 oz) IBW/kg (Calculated) : 61.6  Temp (24hrs), Avg:98.3 F (36.8 C), Min:97.7 F (36.5 C), Max:98.7 F (37.1 C)  Recent Labs  Lab 06/25/19 0624 06/25/19 1846 06/25/19 2015 06/26/19 0426 06/26/19 0429 06/27/19 0413 06/27/19 2030 06/28/19 0431 06/28/19 0810 06/28/19 2044 06/29/19 0633 06/30/19 0409 07/01/19 0517  WBC 22.2*  --    < >  --    < > 25.4*  --  17.6*  --   --  15.7* 18.7* 19.5*  CREATININE 1.05*  --    < >  --    < > 0.59   < > 0.56  --  0.67 0.62 0.56 0.48  LATICACIDVEN 4.1* 1.7  --  1.7  --   --   --   --   --   --   --   --   --   VANCOTROUGH  --   --   --   --   --   --   --   --  14*  --   --   --   --    < > = values in this interval not displayed.    Estimated Creatinine Clearance: 121.1 mL/min (by C-G formula based on SCr of 0.48 mg/dL).    No Known Allergies  Antimicrobials this admission: Vancomycin 4/12 >> Cefepime 4/12>> Clindamycin 4/12>>4/18 Flagyl x 1 on 4/12 CHG/Mupirocin nasal 4/13>>4/17, 4/20>>4/24  Dose adjustments this admission:  4/17 VT 14 mcg/ml on 1 gm IV q12h - no change  Microbiology results: 4/13 Surgical cx (labia abscess): normal vaginal flora, no anaerobes 4/12 Urine: multiple species 4/12 MRSA PCR:  positive 4/12 COVID: negative 4/12 Influenza: negative 4/12 Blood x 2: negatve  Thank you  for allowing pharmacy to be a part of this patient's care.  6/12, Dennie Fetters Phone: [phone] 07/01/2019 2:57 PM

## 2019-07-01 NOTE — Progress Notes (Signed)
5 Days Post-Op   Subjective/Chief Complaint: No new complaints   Objective: Vital signs in last 24 hours: Temp:  [97.7 F (36.5 C)-98.7 F (37.1 C)] 98.6 F (37 C) (04/20 0800) Pulse Rate:  [100-130] 105 (04/20 0426) Resp:  [20-36] 20 (04/20 0426) BP: (121-145)/(80-90) 129/82 (04/20 0426) SpO2:  [94 %-100 %] 98 % (04/20 0426) Weight:  [102.4 kg-121.3 kg] 121.3 kg (04/20 0500) Last BM Date: 06/30/19  Intake/Output from previous day: 04/19 0701 - 04/20 0700 In: 2091.3 [P.O.:1230; I.V.:86.2; NG/GT:140; IV Piggyback:635.1] Out: 1518 [Urine:893; Stool:625] Intake/Output this shift: No intake/output data recorded.  wound looks very similar to yesterday, a little fibrinous exudate R corner, tissue viable  Lab Results:  Recent Labs    06/30/19 0409 07/01/19 0517  WBC 18.7* 19.5*  HGB 8.5* 8.1*  HCT 27.0* 26.1*  PLT 226 229   BMET Recent Labs    06/30/19 0409 07/01/19 0517  NA 146* 141  K 3.7 3.5  CL 111 106  CO2 25 26  GLUCOSE 156* 96  BUN 19 11  CREATININE 0.56 0.48  CALCIUM 7.7* 7.7*   PT/INR No results for input(s): LABPROT, INR in the last 72 hours. ABG No results for input(s): PHART, HCO3 in the last 72 hours.  Invalid input(s): PCO2, PO2  Studies/Results: [place] EKG SITE RITE  Result Date: 06/30/2019 If Site Rite image not attached, placement could not be confirmed due to current cardiac rhythm.   Anti-infectives: Anti-infectives (From admission, onward)   Start     Dose/Rate Route Frequency Ordered Stop   06/26/19 1200  Vancomycin (VANCOCIN) 1,500 mg in sodium chloride 0.9 % 500 mL IVPB  Status:  Discontinued     1,500 mg 250 mL/hr over 120 Minutes Intravenous Every 24 hours 06/25/19 1153 06/26/19 0455   06/26/19 1200  vancomycin (VANCOREADY) IVPB 1500 mg/300 mL  Status:  Discontinued     1,500 mg 150 mL/hr over 120 Minutes Intravenous Every 24 hours 06/26/19 0455 06/26/19 0730   06/26/19 0830  vancomycin (VANCOREADY) IVPB 1500 mg/300 mL  Status:   Discontinued     1,000 mg 100 mL/hr over 120 Minutes Intravenous Every 12 hours 06/26/19 0730 06/26/19 0741   06/26/19 0830  vancomycin (VANCOCIN) IVPB 1000 mg/200 mL premix     1,000 mg 200 mL/hr over 60 Minutes Intravenous Every 12 hours 06/26/19 0742     06/25/19 1145  vancomycin (VANCOCIN) 500 mg in sodium chloride 0.9 % 100 mL IVPB     500 mg 100 mL/hr over 60 Minutes Intravenous NOW 06/25/19 1055 06/25/19 1241   06/24/19 1600  vancomycin (VANCOREADY) IVPB 750 mg/150 mL  Status:  Discontinued     750 mg 150 mL/hr over 60 Minutes Intravenous Every 12 hours 06/23/19 1700 06/24/19 0714   06/24/19 1600  vancomycin (VANCOCIN) IVPB 1000 mg/200 mL premix  Status:  Discontinued     1,000 mg 200 mL/hr over 60 Minutes Intravenous Every 12 hours 06/24/19 0714 06/24/19 0750   06/24/19 1400  clindamycin (CLEOCIN) IVPB 900 mg  Status:  Discontinued     900 mg 100 mL/hr over 30 Minutes Intravenous Every 8 hours 06/24/19 0756 06/29/19 1137   06/24/19 1145  ceFEPIme (MAXIPIME) 2 g in sodium chloride 0.9 % 100 mL IVPB  Status:  Discontinued     2 g 200 mL/hr over 30 Minutes Intravenous To ShortStay Surgical 06/24/19 1131 06/24/19 1457   06/24/19 1030  ceFEPIme (MAXIPIME) 2 g in sodium chloride 0.9 % 100 mL IVPB  2 g 200 mL/hr over 30 Minutes Intravenous Every 8 hours 06/24/19 0714     06/24/19 0800  vancomycin (VANCOCIN) IVPB 1000 mg/200 mL premix  Status:  Discontinued     1,000 mg 200 mL/hr over 60 Minutes Intravenous Every 12 hours 06/24/19 0750 06/25/19 1153   06/24/19 0300  ceFEPIme (MAXIPIME) 2 g in sodium chloride 0.9 % 100 mL IVPB  Status:  Discontinued     2 g 200 mL/hr over 30 Minutes Intravenous Every 12 hours 06/23/19 1700 06/24/19 0714   06/23/19 1700  clindamycin (CLEOCIN) IVPB 600 mg  Status:  Discontinued     600 mg 100 mL/hr over 30 Minutes Intravenous Every 8 hours 06/23/19 1649 06/24/19 0756   06/23/19 1500  vancomycin (VANCOREADY) IVPB 2000 mg/400 mL     2,000 mg 200  mL/hr over 120 Minutes Intravenous  Once 06/23/19 1454 06/23/19 1747   06/23/19 1445  ceFEPIme (MAXIPIME) 2 g in sodium chloride 0.9 % 100 mL IVPB     2 g 200 mL/hr over 30 Minutes Intravenous  Once 06/23/19 1434 06/23/19 1557   06/23/19 1445  metroNIDAZOLE (FLAGYL) IVPB 500 mg     500 mg 100 mL/hr over 60 Minutes Intravenous  Once 06/23/19 1434 06/23/19 1608   06/23/19 1445  vancomycin (VANCOCIN) IVPB 1000 mg/200 mL premix  Status:  Discontinued     1,000 mg 200 mL/hr over 60 Minutes Intravenous  Once 06/23/19 1434 06/23/19 1440   06/23/19 1445  vancomycin (VANCOCIN) 2,000 mg in sodium chloride 0.9 % 500 mL IVPB  Status:  Discontinued     2,000 mg 250 mL/hr over 120 Minutes Intravenous  Once 06/23/19 1440 06/23/19 1454      Assessment/Plan: T2DMwith DKA  Acute renal failure - Crnormal Hypokalemia  Schizophrenia Bipolar disorder ABL anemia - hgb 8.1  Necrotizing fasciitis of abdominal wall and groin S/P I&D 45 x 30 x 4 cm 06/24/19 and 4/15 by Dr. Derrell Lolling  -Afeb, WBC up to 19.5 - No need to take back to the OR at this time.  - Cont BID dressing changes at the bedside. - Cont abx. Cx's inconclusive.  - Consult Plastic Surgery - we will call  FEN: D2 diet VTE: SCDs, Lovenox  ID: flagyl 4/12; Clinda 4/13 - 4/18 . Cefepime/Vanc 4/12>>    LOS: 8 days    Liz Malady 07/01/2019

## 2019-07-01 NOTE — Consult Note (Signed)
[facility]  Reason for Consult: Large abdominal wall and groin wound status post necrotizing fasciitis Referring Physician: Dr. Burke Thompson  Kimberly Orozco is an 43 y.o. female.  HPI: The patient is a 43-year-old female with a past medical history of schizophrenia, bipolar disorder, obesity, type 2 diabetes mellitus who presented to the ED with complaints of altered mental status and found to be in diabetic ketoacidosis complicated by necrotizing soft tissue infection of lower abdominal wall, upper right lower extremity, perineum on 06/23/2019.  Patient underwent extensive debridement of groin, inguinal crease, right medial thigh necrotizing fasciitis on 06/24/2019 by Dr. Ramirez.  The extent of the debridement was 45 x 30 cm. Patient subsequently underwent additional debridement of wound on 06/26/2019 by Dr. Ramirez.   Patient was extubated on 06/27/2019, DKA resolved and patient transferred to hospital service on 06/29/2019. Patient currently on IV vancomycin and cefepime, clindamycin DC'd on 06/29/2019. Plastic surgery was consulted by general surgery for further management of extensive wound.  Patient without any acute complaints during initial consult today. She is sleepy, reports poor sleep recently. No f/c/n/v/cp/sob   History reviewed. No pertinent past medical history.  Past Surgical History:  Procedure Laterality Date  . INCISION AND DRAINAGE OF WOUND N/A 06/26/2019   Procedure: IRRIGATION AND DEBRIDEMENT ABDOMINAL WALL AND GROIN;  Surgeon: Ramirez, Armando, MD;  Location: [facility] OR;  Service: General;  Laterality: N/A;  . INCISION AND DRAINAGE PERIRECTAL ABSCESS Right 06/24/2019   Procedure: IRRIGATION AND DEBRIDEMENT RIGHT GROIN AND RIGHT BUTTOCK;  Surgeon: Ramirez, Armando, MD;  Location: [facility] OR;  Service: General;  Laterality: Right;    History reviewed. No pertinent family history.  Social History:  reports that she has never smoked. She has never used  smokeless tobacco. No history on file for alcohol and drug.  Allergies: No Known Allergies  Medications: I have reviewed the patient's current medications.  Results for orders placed or performed during the hospital encounter of 06/23/19 (from the past 48 hour(s))  Glucose, capillary     Status: Abnormal   Collection Time: 06/29/19  3:30 PM  Result Value Ref Range   Glucose-Capillary 207 (H) 70 - 99 mg/dL    Comment: Glucose reference range applies only to samples taken after fasting for at least 8 hours.  Glucose, capillary     Status: Abnormal   Collection Time: 06/29/19  8:23 PM  Result Value Ref Range   Glucose-Capillary 230 (H) 70 - 99 mg/dL    Comment: Glucose reference range applies only to samples taken after fasting for at least 8 hours.   Comment 1 Notify RN   Glucose, capillary     Status: Abnormal   Collection Time: 06/30/19 12:19 AM  Result Value Ref Range   Glucose-Capillary 196 (H) 70 - 99 mg/dL    Comment: Glucose reference range applies only to samples taken after fasting for at least 8 hours.   Comment 1 Notify RN   CBC     Status: Abnormal   Collection Time: 06/30/19  4:09 AM  Result Value Ref Range   WBC 18.7 (H) 4.0 - 10.5 K/uL   RBC 3.05 (L) 3.87 - 5.11 MIL/uL   Hemoglobin 8.5 (L) 12.0 - 15.0 g/dL    Comment: REPEATED TO VERIFY POST TRANSFUSION SPECIMEN    HCT 27.0 (L) 36.0 - 46.0 %   MCV 88.5 80.0 - 100.0 fL   MCH 27.9 26.0 - 34.0 pg   MCHC 31.5 30.0 - 36.0 g/dL   RDW   16.0 (H) 11.5 - 15.5 %   Platelets 226 150 - 400 K/uL   nRBC 1.8 (H) 0.0 - 0.2 %    Comment: Performed at [facility], [address] [place], [place] [address]  Magnesium     Status: None   Collection Time: 06/30/19  4:09 AM  Result Value Ref Range   Magnesium 2.2 1.7 - 2.4 mg/dL    Comment: Performed at [facility], [address], [place], [place] [address]  Phosphorus     Status: None   Collection Time: 06/30/19  4:09 AM  Result Value Ref Range   Phosphorus 2.5  2.5 - 4.6 mg/dL    Comment: Performed at [facility], [address], [place], [place] [address]  Comprehensive metabolic panel     Status: Abnormal   Collection Time: 06/30/19  4:09 AM  Result Value Ref Range   Sodium 146 (H) 135 - 145 mmol/L   Potassium 3.7 3.5 - 5.1 mmol/L   Chloride 111 98 - 111 mmol/L   CO2 25 22 - 32 mmol/L   Glucose, Bld 156 (H) 70 - 99 mg/dL    Comment: Glucose reference range applies only to samples taken after fasting for at least 8 hours.   BUN 19 6 - 20 mg/dL   Creatinine, Ser 4.17 0.44 - 1.00 mg/dL   Calcium 7.7 (L) 8.9 - 10.3 mg/dL   Total Protein 6.2 (L) 6.5 - 8.1 g/dL   Albumin 1.2 (L) 3.5 - 5.0 g/dL   AST 30 15 - 41 U/L   ALT 29 0 - 44 U/L   Alkaline Phosphatase 129 (H) 38 - 126 U/L   Total Bilirubin 0.3 0.3 - 1.2 mg/dL   GFR calc non Af Amer >60 >60 mL/min   GFR calc Af Amer >60 >60 mL/min   Anion gap 10 5 - 15    Comment: Performed at [facility], [address] [place], [place] [address]  Glucose, capillary     Status: Abnormal   Collection Time: 06/30/19  4:16 AM  Result Value Ref Range   Glucose-Capillary 141 (H) 70 - 99 mg/dL    Comment: Glucose reference range applies only to samples taken after fasting for at least 8 hours.  Glucose, capillary     Status: Abnormal   Collection Time: 06/30/19  8:01 AM  Result Value Ref Range   Glucose-Capillary 211 (H) 70 - 99 mg/dL    Comment: Glucose reference range applies only to samples taken after fasting for at least 8 hours.  Glucose, capillary     Status: Abnormal   Collection Time: 06/30/19 11:48 AM  Result Value Ref Range   Glucose-Capillary 243 (H) 70 - 99 mg/dL    Comment: Glucose reference range applies only to samples taken after fasting for at least 8 hours.  Glucose, capillary     Status: Abnormal   Collection Time: 06/30/19  4:44 PM  Result Value Ref Range   Glucose-Capillary 257 (H) 70 - 99 mg/dL    Comment: Glucose reference range applies only to samples taken after  fasting for at least 8 hours.  Glucose, capillary     Status: Abnormal   Collection Time: 06/30/19  7:48 PM  Result Value Ref Range   Glucose-Capillary 194 (H) 70 - 99 mg/dL    Comment: Glucose reference range applies only to samples taken after fasting for at least 8 hours.  CBC     Status: Abnormal  Collection Time: 07/01/19  5:17 AM  Result Value Ref Range   WBC 19.5 (H) 4.0 - 10.5 K/uL   RBC 2.97 (L) 3.87 - 5.11 MIL/uL   Hemoglobin 8.1 (L) 12.0 - 15.0 g/dL   HCT 26.1 (L) 36.0 - 46.0 %   MCV 87.9 80.0 - 100.0 fL   MCH 27.3 26.0 - 34.0 pg   MCHC 31.0 30.0 - 36.0 g/dL   RDW 16.1 (H) 11.5 - 15.5 %   Platelets 229 150 - 400 K/uL   nRBC 1.4 (H) 0.0 - 0.2 %    Comment: Performed at [facility] [address] [place], [place] [address]  Basic metabolic panel     Status: Abnormal   Collection Time: 07/01/19  5:17 AM  Result Value Ref Range   Sodium 141 135 - 145 mmol/L   Potassium 3.5 3.5 - 5.1 mmol/L   Chloride 106 98 - 111 mmol/L   CO2 26 22 - 32 mmol/L   Glucose, Bld 96 70 - 99 mg/dL    Comment: Glucose reference range applies only to samples taken after fasting for at least 8 hours.   BUN 11 6 - 20 mg/dL   Creatinine, Ser 0.48 0.44 - 1.00 mg/dL   Calcium 7.7 (L) 8.9 - 10.3 mg/dL   GFR calc non Af Amer >60 >60 mL/min   GFR calc Af Amer >60 >60 mL/min   Anion gap 9 5 - 15    Comment: Performed at [facility] [address], [place], [place] [address]  Glucose, capillary     Status: None   Collection Time: 07/01/19  7:40 AM  Result Value Ref Range   Glucose-Capillary 87 70 - 99 mg/dL    Comment: Glucose reference range applies only to samples taken after fasting for at least 8 hours.  Glucose, capillary     Status: Abnormal   Collection Time: 07/01/19 11:35 AM  Result Value Ref Range   Glucose-Capillary 104 (H) 70 - 99 mg/dL    Comment: Glucose reference range applies only to samples taken after fasting for at least 8 hours.    [place] EKG SITE RITE  Result  Date: 06/30/2019 If Site Rite image not attached, placement could not be confirmed due to current cardiac rhythm.   Review of Systems  Constitutional: Negative for chills and fever.  Respiratory: Negative for shortness of breath.   Cardiovascular: Negative for chest pain.  Gastrointestinal: Positive for abdominal pain. Negative for nausea and vomiting.   Blood pressure 129/82, pulse (!) 105, temperature 97.8 F (36.6 C), temperature source Oral, resp. rate 20, height 5\' 7"  (1.702 m), weight 121.3 kg, SpO2 98 %. Physical Exam  Constitutional: She is oriented to person, place, and time.  Ill appearing female, resting in bed   HENT:  Head: Normocephalic and atraumatic.  Respiratory: Effort normal.  GI: Soft. She exhibits no distension. There is abdominal tenderness (Mild). There is no rebound and no guarding.    Genitourinary:    Genitourinary Comments: Catheter in place, rectal tube in place   Neurological: She is alert and oriented to person, place, and time.  Skin: Skin is warm and dry.  Psychiatric: She has a normal mood and affect.   Review of EMR abdominal wound photos from yesterday, 06/30/19:  Large wound extending from upper RLE into abdomen and perineum. Exposed musculature noted along medial thigh and anterior thigh, possibly adductor medially and sartorius anteriorly. Granulation tissue noted throughout. Scattered areas of non  viable tissue noted without overt signs of necrosis. Fibrinous exudate noted throughout.   Assessment/Plan:  Patient at this time is refusing surgery, had a long discussion with patient about her prognosis without surgical intervention, she said she would think about it, but at this time does not want surgery. We will keep scheduled time for irrigation and debridement, placement of wound matrix on 07/07/2019 with Dr. Arita Miss pending patient changes her mind and continue to follow on the periphery. Please call with questions or concerns.  Continue with  dressing changes as recommended by general surgery.   WBC 19.4 today, increased from 18.7 yesterday and 15.7 2 days ago. Patient denies any new infectious symptoms. No objective fevers.  Hgb at 8.1 s/p infusion.  Recommend optimizing nutritional status via protein shakes, multivitamin with vitamin C as able based on primary team.  Appreciate consult, call with any questions or concerns.  DVT prophylaxis with Lovenox per primary team. ID: IV cefepime and Vanc  Kermit Balo Sua Spadafora, PA-C 07/01/2019, 1:29 PM

## 2019-07-01 NOTE — Progress Notes (Signed)
Inpatient Rehabilitation-Admissions Coordinator   Noted pt progressed from bed to chair yesterday with PT. [facility] will place consult order per our protocol to allow for further assessment of pt's candidacy for CIR.   Cheri Rous, OTR/L  Rehab Admissions Coordinator  [phone] 07/01/2019 8:19 AM

## 2019-07-01 NOTE — H&P (View-Only) (Signed)
[facility]  Reason for Consult: Large abdominal wall and groin wound status post necrotizing fasciitis Referring Physician: Dr. Violeta Gelinas  Kimberly Orozco is an 44 y.o. female.  HPI: The patient is a 44 year old female with a past medical history of schizophrenia, bipolar disorder, obesity, type 2 diabetes mellitus who presented to the ED with complaints of altered mental status and found to be in diabetic ketoacidosis complicated by necrotizing soft tissue infection of lower abdominal wall, upper right lower extremity, perineum on 06/23/2019.  Patient underwent extensive debridement of groin, inguinal crease, right medial thigh necrotizing fasciitis on 06/24/2019 by Dr. Derrell Lolling.  The extent of the debridement was 45 x 30 cm. Patient subsequently underwent additional debridement of wound on 06/26/2019 by Dr. Derrell Lolling.   Patient was extubated on 06/27/2019, DKA resolved and patient transferred to hospital service on 06/29/2019. Patient currently on IV vancomycin and cefepime, clindamycin DC'd on 06/29/2019. Plastic surgery was consulted by general surgery for further management of extensive wound.  Patient without any acute complaints during initial consult today. She is sleepy, reports poor sleep recently. No f/c/n/v/cp/sob   History reviewed. No pertinent past medical history.  Past Surgical History:  Procedure Laterality Date  . INCISION AND DRAINAGE OF WOUND N/A 06/26/2019   Procedure: IRRIGATION AND DEBRIDEMENT ABDOMINAL WALL AND GROIN;  Surgeon: Axel Filler, MD;  Location: [facility] OR;  Service: General;  Laterality: N/A;  . INCISION AND DRAINAGE PERIRECTAL ABSCESS Right 06/24/2019   Procedure: IRRIGATION AND DEBRIDEMENT RIGHT GROIN AND RIGHT BUTTOCK;  Surgeon: Axel Filler, MD;  Location: [facility] OR;  Service: General;  Laterality: Right;    History reviewed. No pertinent family history.  Social History:  reports that she has never smoked. She has never used  smokeless tobacco. No history on file for alcohol and drug.  Allergies: No Known Allergies  Medications: I have reviewed the patient's current medications.  Results for orders placed or performed during the hospital encounter of 06/23/19 (from the past 48 hour(s))  Glucose, capillary     Status: Abnormal   Collection Time: 06/29/19  3:30 PM  Result Value Ref Range   Glucose-Capillary 207 (H) 70 - 99 mg/dL    Comment: Glucose reference range applies only to samples taken after fasting for at least 8 hours.  Glucose, capillary     Status: Abnormal   Collection Time: 06/29/19  8:23 PM  Result Value Ref Range   Glucose-Capillary 230 (H) 70 - 99 mg/dL    Comment: Glucose reference range applies only to samples taken after fasting for at least 8 hours.   Comment 1 Notify RN   Glucose, capillary     Status: Abnormal   Collection Time: 06/30/19 12:19 AM  Result Value Ref Range   Glucose-Capillary 196 (H) 70 - 99 mg/dL    Comment: Glucose reference range applies only to samples taken after fasting for at least 8 hours.   Comment 1 Notify RN   CBC     Status: Abnormal   Collection Time: 06/30/19  4:09 AM  Result Value Ref Range   WBC 18.7 (H) 4.0 - 10.5 K/uL   RBC 3.05 (L) 3.87 - 5.11 MIL/uL   Hemoglobin 8.5 (L) 12.0 - 15.0 g/dL    Comment: REPEATED TO VERIFY POST TRANSFUSION SPECIMEN    HCT 27.0 (L) 36.0 - 46.0 %   MCV 88.5 80.0 - 100.0 fL   MCH 27.9 26.0 - 34.0 pg   MCHC 31.5 30.0 - 36.0 g/dL   RDW  16.0 (H) 11.5 - 15.5 %   Platelets 226 150 - 400 K/uL   nRBC 1.8 (H) 0.0 - 0.2 %    Comment: Performed at [facility], [address] [place], [place] [address]  Magnesium     Status: None   Collection Time: 06/30/19  4:09 AM  Result Value Ref Range   Magnesium 2.2 1.7 - 2.4 mg/dL    Comment: Performed at [facility], [address], [place], [place] [address]  Phosphorus     Status: None   Collection Time: 06/30/19  4:09 AM  Result Value Ref Range   Phosphorus 2.5  2.5 - 4.6 mg/dL    Comment: Performed at [facility], [address], [place], [place] [address]  Comprehensive metabolic panel     Status: Abnormal   Collection Time: 06/30/19  4:09 AM  Result Value Ref Range   Sodium 146 (H) 135 - 145 mmol/L   Potassium 3.7 3.5 - 5.1 mmol/L   Chloride 111 98 - 111 mmol/L   CO2 25 22 - 32 mmol/L   Glucose, Bld 156 (H) 70 - 99 mg/dL    Comment: Glucose reference range applies only to samples taken after fasting for at least 8 hours.   BUN 19 6 - 20 mg/dL   Creatinine, Ser 4.17 0.44 - 1.00 mg/dL   Calcium 7.7 (L) 8.9 - 10.3 mg/dL   Total Protein 6.2 (L) 6.5 - 8.1 g/dL   Albumin 1.2 (L) 3.5 - 5.0 g/dL   AST 30 15 - 41 U/L   ALT 29 0 - 44 U/L   Alkaline Phosphatase 129 (H) 38 - 126 U/L   Total Bilirubin 0.3 0.3 - 1.2 mg/dL   GFR calc non Af Amer >60 >60 mL/min   GFR calc Af Amer >60 >60 mL/min   Anion gap 10 5 - 15    Comment: Performed at [facility], [address] [place], [place] [address]  Glucose, capillary     Status: Abnormal   Collection Time: 06/30/19  4:16 AM  Result Value Ref Range   Glucose-Capillary 141 (H) 70 - 99 mg/dL    Comment: Glucose reference range applies only to samples taken after fasting for at least 8 hours.  Glucose, capillary     Status: Abnormal   Collection Time: 06/30/19  8:01 AM  Result Value Ref Range   Glucose-Capillary 211 (H) 70 - 99 mg/dL    Comment: Glucose reference range applies only to samples taken after fasting for at least 8 hours.  Glucose, capillary     Status: Abnormal   Collection Time: 06/30/19 11:48 AM  Result Value Ref Range   Glucose-Capillary 243 (H) 70 - 99 mg/dL    Comment: Glucose reference range applies only to samples taken after fasting for at least 8 hours.  Glucose, capillary     Status: Abnormal   Collection Time: 06/30/19  4:44 PM  Result Value Ref Range   Glucose-Capillary 257 (H) 70 - 99 mg/dL    Comment: Glucose reference range applies only to samples taken after  fasting for at least 8 hours.  Glucose, capillary     Status: Abnormal   Collection Time: 06/30/19  7:48 PM  Result Value Ref Range   Glucose-Capillary 194 (H) 70 - 99 mg/dL    Comment: Glucose reference range applies only to samples taken after fasting for at least 8 hours.  CBC     Status: Abnormal  Collection Time: 07/01/19  5:17 AM  Result Value Ref Range   WBC 19.5 (H) 4.0 - 10.5 K/uL   RBC 2.97 (L) 3.87 - 5.11 MIL/uL   Hemoglobin 8.1 (L) 12.0 - 15.0 g/dL   HCT 26.1 (L) 36.0 - 46.0 %   MCV 87.9 80.0 - 100.0 fL   MCH 27.3 26.0 - 34.0 pg   MCHC 31.0 30.0 - 36.0 g/dL   RDW 16.1 (H) 11.5 - 15.5 %   Platelets 229 150 - 400 K/uL   nRBC 1.4 (H) 0.0 - 0.2 %    Comment: Performed at [facility] [address] [place], [place] [address]  Basic metabolic panel     Status: Abnormal   Collection Time: 07/01/19  5:17 AM  Result Value Ref Range   Sodium 141 135 - 145 mmol/L   Potassium 3.5 3.5 - 5.1 mmol/L   Chloride 106 98 - 111 mmol/L   CO2 26 22 - 32 mmol/L   Glucose, Bld 96 70 - 99 mg/dL    Comment: Glucose reference range applies only to samples taken after fasting for at least 8 hours.   BUN 11 6 - 20 mg/dL   Creatinine, Ser 0.48 0.44 - 1.00 mg/dL   Calcium 7.7 (L) 8.9 - 10.3 mg/dL   GFR calc non Af Amer >60 >60 mL/min   GFR calc Af Amer >60 >60 mL/min   Anion gap 9 5 - 15    Comment: Performed at [facility] [address], [place], [place] [address]  Glucose, capillary     Status: None   Collection Time: 07/01/19  7:40 AM  Result Value Ref Range   Glucose-Capillary 87 70 - 99 mg/dL    Comment: Glucose reference range applies only to samples taken after fasting for at least 8 hours.  Glucose, capillary     Status: Abnormal   Collection Time: 07/01/19 11:35 AM  Result Value Ref Range   Glucose-Capillary 104 (H) 70 - 99 mg/dL    Comment: Glucose reference range applies only to samples taken after fasting for at least 8 hours.    [place] EKG SITE RITE  Result  Date: 06/30/2019 If Site Rite image not attached, placement could not be confirmed due to current cardiac rhythm.   Review of Systems  Constitutional: Negative for chills and fever.  Respiratory: Negative for shortness of breath.   Cardiovascular: Negative for chest pain.  Gastrointestinal: Positive for abdominal pain. Negative for nausea and vomiting.   Blood pressure 129/82, pulse (!) 105, temperature 97.8 F (36.6 C), temperature source Oral, resp. rate 20, height 5\' 7"  (1.702 m), weight 121.3 kg, SpO2 98 %. Physical Exam  Constitutional: She is oriented to person, place, and time.  Ill appearing female, resting in bed   HENT:  Head: Normocephalic and atraumatic.  Respiratory: Effort normal.  GI: Soft. She exhibits no distension. There is abdominal tenderness (Mild). There is no rebound and no guarding.    Genitourinary:    Genitourinary Comments: Catheter in place, rectal tube in place   Neurological: She is alert and oriented to person, place, and time.  Skin: Skin is warm and dry.  Psychiatric: She has a normal mood and affect.   Review of EMR abdominal wound photos from yesterday, 06/30/19:  Large wound extending from upper RLE into abdomen and perineum. Exposed musculature noted along medial thigh and anterior thigh, possibly adductor medially and sartorius anteriorly. Granulation tissue noted throughout. Scattered areas of non  viable tissue noted without overt signs of necrosis. Fibrinous exudate noted throughout.   Assessment/Plan:  Patient at this time is refusing surgery, had a long discussion with patient about her prognosis without surgical intervention, she said she would think about it, but at this time does not want surgery. We will keep scheduled time for irrigation and debridement, placement of wound matrix on 07/07/2019 with Dr. Arita Miss pending patient changes her mind and continue to follow on the periphery. Please call with questions or concerns.  Continue with  dressing changes as recommended by general surgery.   WBC 19.4 today, increased from 18.7 yesterday and 15.7 2 days ago. Patient denies any new infectious symptoms. No objective fevers.  Hgb at 8.1 s/p infusion.  Recommend optimizing nutritional status via protein shakes, multivitamin with vitamin C as able based on primary team.  Appreciate consult, call with any questions or concerns.  DVT prophylaxis with Lovenox per primary team. ID: IV cefepime and Vanc  Kermit Balo Elkin Belfield, PA-C 07/01/2019, 1:29 PM

## 2019-07-02 LAB — CBC
HCT: 27 % — ABNORMAL LOW (ref 36.0–46.0)
Hemoglobin: 8.4 g/dL — ABNORMAL LOW (ref 12.0–15.0)
MCH: 27.7 pg (ref 26.0–34.0)
MCHC: 31.1 g/dL (ref 30.0–36.0)
MCV: 89.1 fL (ref 80.0–100.0)
Platelets: 273 10*3/uL (ref 150–400)
RBC: 3.03 MIL/uL — ABNORMAL LOW (ref 3.87–5.11)
RDW: 16.9 % — ABNORMAL HIGH (ref 11.5–15.5)
WBC: 17.3 10*3/uL — ABNORMAL HIGH (ref 4.0–10.5)
nRBC: 0.8 % — ABNORMAL HIGH (ref 0.0–0.2)

## 2019-07-02 LAB — GLUCOSE, CAPILLARY
Glucose-Capillary: 184 mg/dL — ABNORMAL HIGH (ref 70–99)
Glucose-Capillary: 215 mg/dL — ABNORMAL HIGH (ref 70–99)
Glucose-Capillary: 202 mg/dL — ABNORMAL HIGH (ref 70–99)
Glucose-Capillary: 157 mg/dL — ABNORMAL HIGH (ref 70–99)

## 2019-07-02 MED ORDER — METHOCARBAMOL 500 MG PO TABS
500.0000 mg | ORAL_TABLET | Freq: Four times a day (QID) | ORAL | Status: DC | PRN
  Administered 2019-07-07 – 2019-07-24 (×6): 500 mg via ORAL
  Filled 2019-07-02 (×6): qty 1

## 2019-07-02 MED ORDER — MORPHINE SULFATE (PF) 2 MG/ML IV SOLN
2.0000 mg | INTRAVENOUS | Status: DC | PRN
  Administered 2019-07-02 – 2019-07-08 (×9): 2 mg via INTRAVENOUS
  Filled 2019-07-02 (×10): qty 1

## 2019-07-02 MED ORDER — ACETAMINOPHEN 500 MG PO TABS
1000.0000 mg | ORAL_TABLET | Freq: Four times a day (QID) | ORAL | Status: DC
  Administered 2019-07-02 – 2019-07-24 (×75): 1000 mg via ORAL
  Filled 2019-07-02 (×80): qty 2

## 2019-07-02 MED ORDER — GLUCERNA SHAKE PO LIQD
237.0000 mL | Freq: Three times a day (TID) | ORAL | Status: DC
  Administered 2019-07-02 – 2019-07-12 (×13): 237 mL via ORAL

## 2019-07-02 MED ORDER — OXYCODONE HCL 5 MG PO TABS
5.0000 mg | ORAL_TABLET | ORAL | Status: DC | PRN
  Administered 2019-07-02 – 2019-07-09 (×6): 10 mg via ORAL
  Administered 2019-07-10: 5 mg via ORAL
  Administered 2019-07-11 (×2): 10 mg via ORAL
  Administered 2019-07-15: 5 mg via ORAL
  Administered 2019-07-15 – 2019-07-24 (×10): 10 mg via ORAL
  Filled 2019-07-02 (×14): qty 2
  Filled 2019-07-02: qty 1
  Filled 2019-07-02 (×3): qty 2
  Filled 2019-07-02: qty 1
  Filled 2019-07-02: qty 2

## 2019-07-02 NOTE — Progress Notes (Signed)
Inpatient Rehabilitation-Admissions Coordinator   CIR consult received. Met with pt bedside for rehab assessment. Pt sitting up in recliner and willing to speak with me with about rehab options. Pt shocked to hear she may need a few weeks of rehab and did not appear interested in CIR. When asking about caregiver support at DC, she gave me permission to speak to her mother, [name]. Per [name], she and her husband are unable to provide any hands on physical assistance at DC and cannot assist with wound care management. Based on limited caregiver assistance and extensive wound care needs, [facility] recommends SNF to give pt an extended period of time for wound care and a slower-paced rehab program. [facility] has notified her treatment team and will sign off.   Raechel Ache, OTR/L  Rehab Admissions Coordinator  [phone] 07/02/2019 2:14 PM

## 2019-07-02 NOTE — Progress Notes (Signed)
Calorie Count Note  48 hour calorie count ordered.  Diet: Regular diet with thin liquids.  Supplements:   Boost Breeze po TID, each supplement provides 250 kcal and 9 grams of protein  30 ml Prostat po TID, each supplement provides 100 kcal and 15 grams of protein.   Juven BID, each packet provides 95 calories, 2.5 grams of protein.  Magic cup TID with meals, each supplement provides 290 kcal and 9 grams of protein  Glucerna Shake po TID, each supplement provides 220 kcal and 10 grams of protein  Breakfast: 237 kcal, 6 grams of protein Lunch: 166 kcal, 12 grams of protein Dinner: 283 kcal, 7 grams of protein Supplements: 1270 kcal and 82 grams of protein  Day 2 Total intake: 1956 kcal (82% of kcal needs)  107 grams of protein (89% of protein needs)  Estimated Nutritional Needs:  Kcal:  2400-2700 kcals Protein:  120-150 grams Fluid:  >/= 2 L  Meal completion 50%. Pt reports she does not favor most of the food due to taste, however consumes at least half of her meal. Pt currently has Boost Breeze, Prostat, and Glucerna Shake ordered and has been consuming them. RD to continue with current orders to aid in caloric and protein needs as well as in healing. Will continue to provide Magic cup at meals to aid in po intake. Continue Juven for wound healing.   Nutrition Dx:  Increased nutrient needs related to wound healing, acute illness as evidenced by estimated needs; ongoing  Goal:  Pt to meet >/= 90% of their estimated nutrition needs; progressing  Intervention:   Provide Boost Breeze po TID, each supplement provides 250 kcal and 9 grams of protein  Provide 30 ml Prostat po TID, each supplement provides 100 kcal and 15 grams of protein.   Provide Glucerna Shake po TID, each supplement provides 220 kcal and 10 grams of protein.  Continue Juven BID, each packet provides 95 calories, 2.5 grams of protein.  Continue Magic cup TID with meals, each supplement provides 290 kcal  and 9 grams of protein  Kimberly Smiling, MS, RD, LDN RD pager number/after hours weekend pager number on Amion.

## 2019-07-02 NOTE — Progress Notes (Signed)
6 Days Post-Op  Subjective: CC: Doing well. Still having pain with dressing changes. Some drainage around foley.   Objective: Vital signs in last 24 hours: Temp:  [97.8 F (36.6 C)-99.1 F (37.3 C)] 98.4 F (36.9 C) (04/21 0743) Pulse Rate:  [107-117] 108 (04/21 0744) Resp:  [17-25] 17 (04/21 0743) BP: (116-125)/(73-81) 120/81 (04/21 0743) SpO2:  [92 %-99 %] 97 % (04/21 0743) Weight:  [124.4 kg] 124.4 kg (04/21 0339) Last BM Date: 07/01/19  Intake/Output from previous day: 04/20 0701 - 04/21 0700 In: 1080 [P.O.:360] Out: 500 [Urine:500] Intake/Output this shift: No intake/output data recorded.  PE: Gen:  Alert, NAD, pleasant HEENT: EOM's intact, pupils equal and round Card:  Mild tachycardiac  Pulm:  CTAB, no W/R/R, effort normal Abd: Soft, NT other than over areas of wound  GU: Catheter in place with yellow urine in bag.  Wound: Please see pictures below with descriptions. Overall wound appears healthy. There is ~75% granulation tissue with some fibrinous tissue. Wound extends from the right lateral abdomen to the right thigh/groin and to the left abdomen. Wound edges are clean. Small amount of SS drainage from wound without purulence. Wound measures 45x30x7cm.         Lab Results:  Recent Labs    07/01/19 0517 07/02/19 0306  WBC 19.5* 17.3*  HGB 8.1* 8.4*  HCT 26.1* 27.0*  PLT 229 273   BMET Recent Labs    06/30/19 0409 07/01/19 0517  NA 146* 141  K 3.7 3.5  CL 111 106  CO2 25 26  GLUCOSE 156* 96  BUN 19 11  CREATININE 0.56 0.48  CALCIUM 7.7* 7.7*   PT/INR No results for input(s): LABPROT, INR in the last 72 hours. CMP     Component Value Date/Time   NA 141 07/01/2019 0517   K 3.5 07/01/2019 0517   CL 106 07/01/2019 0517   CO2 26 07/01/2019 0517   GLUCOSE 96 07/01/2019 0517   BUN 11 07/01/2019 0517   CREATININE 0.48 07/01/2019 0517   CALCIUM 7.7 (L) 07/01/2019 0517   PROT 6.2 (L) 06/30/2019 0409   ALBUMIN 1.2 (L) 06/30/2019 0409    AST 30 06/30/2019 0409   ALT 29 06/30/2019 0409   ALKPHOS 129 (H) 06/30/2019 0409   BILITOT 0.3 06/30/2019 0409   GFRNONAA >60 07/01/2019 0517   GFRAA >60 07/01/2019 0517   Lipase  No results found for: LIPASE     Studies/Results: [place] EKG SITE RITE  Result Date: 06/30/2019 If Site Rite image not attached, placement could not be confirmed due to current cardiac rhythm.   Anti-infectives: Anti-infectives (From admission, onward)   Start     Dose/Rate Route Frequency Ordered Stop   06/26/19 1200  Vancomycin (VANCOCIN) 1,500 mg in sodium chloride 0.9 % 500 mL IVPB  Status:  Discontinued     1,500 mg 250 mL/hr over 120 Minutes Intravenous Every 24 hours 06/25/19 1153 06/26/19 0455   06/26/19 1200  vancomycin (VANCOREADY) IVPB 1500 mg/300 mL  Status:  Discontinued     1,500 mg 150 mL/hr over 120 Minutes Intravenous Every 24 hours 06/26/19 0455 06/26/19 0730   06/26/19 0830  vancomycin (VANCOREADY) IVPB 1500 mg/300 mL  Status:  Discontinued     1,000 mg 100 mL/hr over 120 Minutes Intravenous Every 12 hours 06/26/19 0730 06/26/19 0741   06/26/19 0830  vancomycin (VANCOCIN) IVPB 1000 mg/200 mL premix     1,000 mg 200 mL/hr over 60 Minutes Intravenous Every 12 hours 06/26/19  7782     06/25/19 1145  vancomycin (VANCOCIN) 500 mg in sodium chloride 0.9 % 100 mL IVPB     500 mg 100 mL/hr over 60 Minutes Intravenous NOW 06/25/19 1055 06/25/19 1241   06/24/19 1600  vancomycin (VANCOREADY) IVPB 750 mg/150 mL  Status:  Discontinued     750 mg 150 mL/hr over 60 Minutes Intravenous Every 12 hours 06/23/19 1700 06/24/19 0714   06/24/19 1600  vancomycin (VANCOCIN) IVPB 1000 mg/200 mL premix  Status:  Discontinued     1,000 mg 200 mL/hr over 60 Minutes Intravenous Every 12 hours 06/24/19 0714 06/24/19 0750   06/24/19 1400  clindamycin (CLEOCIN) IVPB 900 mg  Status:  Discontinued     900 mg 100 mL/hr over 30 Minutes Intravenous Every 8 hours 06/24/19 0756 06/29/19 1137   06/24/19 1145  ceFEPIme  (MAXIPIME) 2 g in sodium chloride 0.9 % 100 mL IVPB  Status:  Discontinued     2 g 200 mL/hr over 30 Minutes Intravenous To ShortStay Surgical 06/24/19 1131 06/24/19 1457   06/24/19 1030  ceFEPIme (MAXIPIME) 2 g in sodium chloride 0.9 % 100 mL IVPB     2 g 200 mL/hr over 30 Minutes Intravenous Every 8 hours 06/24/19 0714     06/24/19 0800  vancomycin (VANCOCIN) IVPB 1000 mg/200 mL premix  Status:  Discontinued     1,000 mg 200 mL/hr over 60 Minutes Intravenous Every 12 hours 06/24/19 0750 06/25/19 1153   06/24/19 0300  ceFEPIme (MAXIPIME) 2 g in sodium chloride 0.9 % 100 mL IVPB  Status:  Discontinued     2 g 200 mL/hr over 30 Minutes Intravenous Every 12 hours 06/23/19 1700 06/24/19 0714   06/23/19 1700  clindamycin (CLEOCIN) IVPB 600 mg  Status:  Discontinued     600 mg 100 mL/hr over 30 Minutes Intravenous Every 8 hours 06/23/19 1649 06/24/19 0756   06/23/19 1500  vancomycin (VANCOREADY) IVPB 2000 mg/400 mL     2,000 mg 200 mL/hr over 120 Minutes Intravenous  Once 06/23/19 1454 06/23/19 1747   06/23/19 1445  ceFEPIme (MAXIPIME) 2 g in sodium chloride 0.9 % 100 mL IVPB     2 g 200 mL/hr over 30 Minutes Intravenous  Once 06/23/19 1434 06/23/19 1557   06/23/19 1445  metroNIDAZOLE (FLAGYL) IVPB 500 mg     500 mg 100 mL/hr over 60 Minutes Intravenous  Once 06/23/19 1434 06/23/19 1608   06/23/19 1445  vancomycin (VANCOCIN) IVPB 1000 mg/200 mL premix  Status:  Discontinued     1,000 mg 200 mL/hr over 60 Minutes Intravenous  Once 06/23/19 1434 06/23/19 1440   06/23/19 1445  vancomycin (VANCOCIN) 2,000 mg in sodium chloride 0.9 % 500 mL IVPB  Status:  Discontinued     2,000 mg 250 mL/hr over 120 Minutes Intravenous  Once 06/23/19 1440 06/23/19 1454       Assessment/Plan T2DMwith DKA  Acute renal failure - Crnormal, 0.48 (4/20) Hypokalemia  Schizophrenia Bipolar disorder ABL anemia -hgb 8.4  Necrotizing fasciitis of abdominal wall and groin S/P I&D 45 x 30 x 4 cm 06/24/19 and  4/15 by Dr. Derrell Lolling - No need to take back to the OR at this time.  - Cont BID dressing changes at the bedside. - Cont abx for now. Reassess tomorrow. Cx's inconclusive.  - Consult Plastic Surgery - patient declines surgery at this time. Will continue to discuss with patient.   FEN:Reg VTE: SCDs, Lovenox  ID: Flagyl 4/12; Clinda 4/13 - 4/18 .  Cefepime/Vanc 4/12>>  Afebrile, WBC downtrending, 17.3. Reassess duration of abx tomorrow.  Foley - in place to aid in wound healing Follow-Up - TBD    LOS: 9 days    Jacinto Halim , [facility] 07/02/2019, 10:18 AM Please see Amion for pager number during day hours 7:00am-4:30pm

## 2019-07-02 NOTE — Progress Notes (Signed)
Physical Therapy Treatment Patient Details Name: Kimberly Orozco MRN: [id number] DOB: [birth year] Today's Date: 07/02/2019    History of Present Illness 44 year old female admitted 06/23/19 with altered mental status and found to be in DKA with necrotizing soft tissue infection lower abdominal wall and upper RLE extending into perineum. Patient had extensive debridement by surgery 06/24/19 and developed shock requiring pressors. Repeat debridement 06/26/19, no longer requiring pressors and extubated 06/27/19. BKA resolved and patient transferred to hospitalist service 4/18, PCU 4/19. Plastic surgery consulted. Patient declining surgery at this time. PMH: schizophrenia, bipolar disorder, obesity and T2DM    PT Comments    Patient progressing her mobility and required less assistance (but still 2 person assist) for transfer from bed to chair and was able to ambulate a few feet to the chair with RW. Patient demonstrates decreased standing tolerance despite use of RW. Continued recommendation for skilled PT services and discharge to inpatient rehab facility.   Follow Up Recommendations  CIR     Equipment Recommendations  Other (comment);Wheelchair (measurements PT);Wheelchair cushion (measurements PT);3in1 (PT);Rolling walker with 5" wheels;Hospital bed(TBD at next level of care)    Recommendations for Other Services Rehab consult     Precautions / Restrictions Precautions Precautions: Fall;Other (comment) Precaution Comments: L PICC, HOB>30 degrees, rectal tube, catheter Restrictions Weight Bearing Restrictions: No    Mobility  Bed Mobility Overal bed mobility: Needs Assistance Bed Mobility: Supine to Sit     Supine to sit: +2 for physical assistance;+2 for safety/equipment;HOB elevated;Mod assist;Max assist     General bed mobility comments: Cues for sequencing and technique.  Transfers Overall transfer level: Needs assistance Equipment used: Rolling walker (2 wheeled) Transfers:  Sit to/from Stand Sit to Stand: Mod assist;+2 physical assistance;+2 safety/equipment;From elevated surface         General transfer comment: cues for hand placement, patient uanble trial 1 but was able to complete stand-step transfer a few feet from bed to chair, bed height increased slightly  Ambulation/Gait Ambulation/Gait assistance: Min assist;+2 physical assistance;+2 safety/equipment Gait Distance (Feet): 4 Feet Assistive device: Rolling walker (2 wheeled) Gait Pattern/deviations: Decreased step length - right;Decreased step length - left     General Gait Details: Patient was able to ambulate a few feet from bed to chair (that was a few feet away from the bed) with cues and assist for balance and RW management.    Balance Overall balance assessment: Needs assistance Sitting-balance support: Feet supported Sitting balance-Leahy Scale: Fair     Standing balance support: Bilateral upper extremity supported Standing balance-Leahy Scale: Poor Standing balance comment: Standing tolerance approx 30 seconds     Cognition Arousal/Alertness: Awake/alert Behavior During Therapy: WFL for tasks assessed/performed Overall Cognitive Status: Within Functional Limits for tasks assessed General Comments: Cooperative and agreeable during session, can be mildly impulsive at times.         General Comments General comments (skin integrity, edema, etc.): Therapy tech assisted patient with hygiene when standing because despite catheter and rectal tube, patient noted to be incontinent on chuck pad on bed once she stood. Nurse informed. Once in chair, HR 122 bpm, O2 sat 95%, RR 25      Pertinent Vitals/Pain Pain Assessment: Faces Faces Pain Scale: Hurts a little bit Pain Location: abdomen Pain Descriptors / Indicators: Pressure Pain Intervention(s): Monitored during session;Limited activity within patient's tolerance;Repositioned;Premedicated before session           PT Goals (current  goals can now be found in the care plan section) Progress  towards PT goals: Progressing toward goals    Frequency    Min 3X/week      PT Plan Current plan remains appropriate       AM-PAC PT "6 Clicks" Mobility   Outcome Measure  Help needed turning from your back to your side while in a flat bed without using bedrails?: A Lot Help needed moving from lying on your back to sitting on the side of a flat bed without using bedrails?: A Lot Help needed moving to and from a bed to a chair (including a wheelchair)?: A Lot Help needed standing up from a chair using your arms (e.g., wheelchair or bedside chair)?: A Lot Help needed to walk in hospital room?: A Lot Help needed climbing 3-5 steps with a railing? : Total 6 Click Score: 11    End of Session Equipment Utilized During Treatment: Gait belt((up high due to abdominal wound)) Activity Tolerance: Patient limited by pain;Patient limited by fatigue Patient left: in chair;with call bell/phone within reach;with chair alarm set Nurse Communication: Mobility status;Other (comment)(catheter and rectal tube in but leaking) PT Visit Diagnosis: Other abnormalities of gait and mobility (R26.89);Muscle weakness (generalized) (M62.81);Pain     Time: 1040-1110 PT Time Calculation (min) (ACUTE ONLY): 30 min  Charges:  $Therapeutic Activity: 23-37 mins                     Angelene Giovanni, PT, DPT Acute Rehab [phone] office     Angelene Giovanni 07/02/2019, 1:47 PM

## 2019-07-02 NOTE — Progress Notes (Signed)
PROGRESS NOTE  Kimberly Orozco  QJJ:[id number] DOB: [birth year] DOA: 06/23/2019 PCP: Patient, No Pcp Per   Brief Narrative:  Kimberly Orozco is a 44 y.o. female with a history of schizophrenia, bipolar disorder, obesity and T2DM who presented to the ED 4/12 with altered mental status found to be in DKA and with necrotizing soft tissue infection over lower abdominal wall and upper RLE extending into perineum. She was taken for extensive debridement by surgery on 4/13 and subsequently developed shock requiring pressors. Transient oliguria was noted, improved with fluid resuscitation. Taken back to ED for further debridement 4/15 and ultimately liberated from pressors and extubated 4/16. DKA has resolved and the patient has been transferred to hospitalist service on 4/18, to PCU 4/19.   Assessment & Plan: Active Problems:   DKA (diabetic ketoacidoses) (HCC)   Necrotizing cellulitis   Septic shock ([place])   AKI (acute kidney injury) ([place])   Acute respiratory failure ([place])  Septic shock due to necrotizing soft tissue infection:  - Complicated by uncontrolled hyperglycemia due to diabetes. Blood cultures negative and wound cultures w/no dominant organism. - Continue IV antibiotics including vancomycin, cefepime. WBC up slightly again though afebrile. Cultures unhelpful. - Continue po and IV pain control - Wound care per general surgery. s/p large debridement (45cc x 30cm) on 4/13 and repeat debridement of 3-5cm additional tissue from bilateral edges 4/15.No current plans for repeat debridement, plastics consulted.  Commend patient to go to surgery, but currently patient is declining, will discuss again with the patient, continue vitamin C, zinc. -Nutritionist consulted to address nutrition status. - Volume status stable.  Acute blood loss anemia:  - Following extensive surgery, s/p 1u PRBCs -Trend CBC closely and transfuse as needed.  Acute hypoxic respiratory failure: s/p ETT 4/13 - 4/16.  -  Continue supplemental oxygen if needed to maintain SpO2 90%.   DKA, uncontrolled T2DM: DKA resolved. HbA1c 14.4%. - Continue basal-bolus insulin with levemir 30u BID and will switch to AC/HS SSI - Will benefit from diabetes education once outside scope of such severe illness.  AKI: Resolved.   Poor IV access: And will require IV therapies.  - PICC left brachial 4/19  Hypokalemia:  - Supplement  Hypernatremia:  - Hydrate po, monitor.  Acute metabolic encephalopathy: Due to ICU delirium, shock, metabolic imbalances. - Delirium precautions, continue home psychotropic medications as below.  - Continue tube feeds.  Bipolar disorder, schizophrenia:  - Continue haldol, temazepam, divalproex and cogentin. Mentation improving.  - Note valproic acid level undetectable at admission.  Loose stools: In setting of tube feeds.  - DCed clindamycin - Abd benign. Will follow exam, fever curve, leukocytosis, and output.  - Can DC flexiseal if/when stools no longer loose, continues to need this.  RN Pressure Injury Documentation: Pressure Injury 06/23/19 Buttocks Left (Active)  06/23/19 1945  Location: Buttocks  Location Orientation: Left  Staging:   Wound Description (Comments):   Present on Admission: Yes    Obesity: Estimated body mass index is 42.95 kg/m as calculated from the following:   Height as of this encounter: 5\' 7"  (1.702 m).   Weight as of this encounter: 124.4 kg.  DVT prophylaxis: Lovenox Code Status: Full Family Communication: None at bedside. Mother by phone today Disposition Plan:  Status is: Inpatient  Remains inpatient appropriate because: She remains on IV antibiotics, remains with massive wound requiring further debridement.   Dispo: The patient is from: Home              Anticipated d/c  is to: CIR              Anticipated d/c date is: > 3 days              Patient currently is not medically stable to d/c.  Consultants:   PCCM  General  surgery  Plastic surgery  Procedures:   Central line placement 4/14  ETT 4/13 - 4/16 06/24/19 IRRIGATION AND DEBRIDEMENT RIGHT GROIN AND RIGHT BUTTOCK Axel Filleramirez, Armando, MD  06/24/19 IRRIGATION AND DEBRIDEMENT OF [name] Axel Filleramirez, Armando, MD  06/26/19 IRRIGATION AND DEBRIDEMENT ABDOMINAL WALL AND GROIN       Antimicrobials: Metronidazole x1 (4/12) Cefepime (4/12) >> Vancomycin (4/12) >> Clindamycin (4/12) - 4/18  Subjective: No complaints currently. Got PICC placed, cortrak out, eating ok. Pain controlled. No fever.  Objective: Vitals:   07/02/19 0152 07/02/19 0339 07/02/19 0743 07/02/19 0744  BP:  116/80 120/81   Pulse: (!) 110 (!) 109 (!) 116 (!) 108  Resp: 18 20 17    Temp:  97.9 F (36.6 C) 98.4 F (36.9 C)   TempSrc:  Oral    SpO2: 96% 94% 97%   Weight:  124.4 kg    Height:        Intake/Output Summary (Last 24 hours) at 07/02/2019 1546 Last data filed at 07/02/2019 0338 Gross per 24 hour  Intake 700 ml  Output 400 ml  Net 300 ml   Filed Weights   07/01/19 0426 07/01/19 0500 07/02/19 0339  Weight: 102.4 kg 121.3 kg 124.4 kg     Awake Alert, Oriented X 3, No new F.N deficits, Normal affect Symmetrical Chest wall movement, Good air movement bilaterally, CTAB RRR,No Gallops,Rubs or new Murmurs, No Parasternal Heave +ve B.Sounds, Abd Soft, No tenderness, No rebound - guarding or rigidity. No Cyanosis, Clubbing or edema, No new Rash or bruise     Data Reviewed: I have personally reviewed following labs and imaging studies  CBC: Recent Labs  Lab 06/28/19 0431 06/29/19 0633 06/30/19 0409 07/01/19 0517 07/02/19 0306  WBC 17.6* 15.7* 18.7* 19.5* 17.3*  HGB 7.1* 6.8* 8.5* 8.1* 8.4*  HCT 22.4* 21.7* 27.0* 26.1* 27.0*  MCV 84.5 87.5 88.5 87.9 89.1  PLT 144* 166 226 229 273   Basic Metabolic Panel: Recent Labs  Lab 06/26/19 0425 06/26/19 0429 06/27/19 0413 06/27/19 2030 06/28/19 0431 06/28/19 2044 06/29/19 0633 06/30/19 0409 07/01/19 0517  NA  --     < > 143   < > 147* 143 148* 146* 141  K  --    < > 3.1*   < > 3.3* 3.8 3.4* 3.7 3.5  CL  --    < > 110   < > 116* 114* 115* 111 106  CO2  --    < > 23   < > 25 25 26 25 26   GLUCOSE  --    < > 188*   < > 102* 217* 98 156* 96  BUN  --    < > 15   < > 16 21* 20 19 11   CREATININE  --    < > 0.59   < > 0.56 0.67 0.62 0.56 0.48  CALCIUM  --    < > 7.1*   < > 7.0* 6.9* 7.3* 7.7* 7.7*  MG 1.7  --  1.6*  --  2.0  --  2.2 2.2  --   PHOS 1.8*  --  2.8  --  2.6  --  2.5 2.5  --    < > =  values in this interval not displayed.   GFR: Estimated Creatinine Clearance: 122.8 mL/min (by C-G formula based on SCr of 0.48 mg/dL). Liver Function Tests: Recent Labs  Lab 06/30/19 0409  AST 30  ALT 29  ALKPHOS 129*  BILITOT 0.3  PROT 6.2*  ALBUMIN 1.2*   No results for input(s): LIPASE, AMYLASE in the last 168 hours. No results for input(s): AMMONIA in the last 168 hours. Coagulation Profile: No results for input(s): INR, PROTIME in the last 168 hours. Cardiac Enzymes: No results for input(s): CKTOTAL, CKMB, CKMBINDEX, TROPONINI in the last 168 hours. BNP (last 3 results) No results for input(s): PROBNP in the last 8760 hours. HbA1C: No results for input(s): HGBA1C in the last 72 hours. CBG: Recent Labs  Lab 07/01/19 1135 07/01/19 1625 07/01/19 2114 07/02/19 0728 07/02/19 1158  GLUCAP 104* 129* 157* 184* 215*   Lipid Profile: No results for input(s): CHOL, HDL, LDLCALC, TRIG, CHOLHDL, LDLDIRECT in the last 72 hours. Thyroid Function Tests: No results for input(s): TSH, T4TOTAL, FREET4, T3FREE, THYROIDAB in the last 72 hours. Anemia Panel: No results for input(s): VITAMINB12, FOLATE, FERRITIN, TIBC, IRON, RETICCTPCT in the last 72 hours. Urine analysis:    Component Value Date/Time   COLORURINE YELLOW 06/23/2019 1715   APPEARANCEUR CLOUDY (A) 06/23/2019 1715   LABSPEC 1.028 06/23/2019 1715   PHURINE 6.0 06/23/2019 1715   GLUCOSEU >=500 (A) 06/23/2019 1715   HGBUR LARGE (A)  06/23/2019 1715   BILIRUBINUR NEGATIVE 06/23/2019 1715   KETONESUR 20 (A) 06/23/2019 1715   PROTEINUR 30 (A) 06/23/2019 1715   NITRITE NEGATIVE 06/23/2019 1715   LEUKOCYTESUR LARGE (A) 06/23/2019 1715   Recent Results (from the past 240 hour(s))  Blood Culture (routine x 2)     Status: None   Collection Time: 06/23/19  2:55 PM   Specimen: BLOOD  Result Value Ref Range Status   Specimen Description BLOOD LEFT ANTECUBITAL  Final   Special Requests   Final    BOTTLES DRAWN AEROBIC AND ANAEROBIC Blood Culture results may not be optimal due to an inadequate volume of blood received in culture bottles   Culture   Final    NO GROWTH 5 DAYS Performed at [facility], [address], [place], [place] [address]    Report Status 06/28/2019 FINAL  Final  Blood Culture (routine x 2)     Status: None   Collection Time: 06/23/19  3:00 PM   Specimen: BLOOD  Result Value Ref Range Status   Specimen Description BLOOD RIGHT ANTECUBITAL  Final   Special Requests   Final    BOTTLES DRAWN AEROBIC AND ANAEROBIC Blood Culture results may not be optimal due to an inadequate volume of blood received in culture bottles   Culture   Final    NO GROWTH 5 DAYS Performed at [facility], [address] [place], [place] [address]    Report Status 06/28/2019 FINAL  Final  Respiratory Panel by RT PCR (Flu A&B, Covid) - Nasopharyngeal Swab     Status: None   Collection Time: 06/23/19  3:21 PM   Specimen: Nasopharyngeal Swab  Result Value Ref Range Status   SARS Coronavirus 2 by RT PCR NEGATIVE NEGATIVE Final    Comment: (NOTE) SARS-CoV-2 target nucleic acids are NOT DETECTED. The SARS-CoV-2 RNA is generally detectable in upper respiratoy specimens during the acute phase of infection. The lowest concentration of SARS-CoV-2 viral copies this assay can detect is 131 copies/mL. A negative result does not  preclude SARS-Cov-2 infection and should not be used as the sole basis for treatment or other  patient management decisions. A negative result may occur with  improper specimen collection/handling, submission of specimen other than nasopharyngeal swab, presence of viral mutation(s) within the areas targeted by this assay, and inadequate number of viral copies (<131 copies/mL). A negative result must be combined with clinical observations, patient history, and epidemiological information. The expected result is Negative. Fact Sheet for Patients:  https://www.moore.com/ Fact Sheet for Healthcare Providers:  https://www.young.biz/ This test is not yet ap proved or cleared by the [place] FDA and  has been authorized for detection and/or diagnosis of SARS-CoV-2 by FDA under an Emergency Use Authorization (EUA). This EUA will remain  in effect (meaning this test can be used) for the duration of the COVID-19 declaration under Section 564(b)(1) of the Act, 21 U.S.C. section 360bbb-3(b)(1), unless the authorization is terminated or revoked sooner.    Influenza A by PCR NEGATIVE NEGATIVE Final   Influenza B by PCR NEGATIVE NEGATIVE Final    Comment: (NOTE) The Xpert Xpress SARS-CoV-2/FLU/RSV assay is intended as an aid in  the diagnosis of influenza from Nasopharyngeal swab specimens and  should not be used as a sole basis for treatment. Nasal washings and  aspirates are unacceptable for Xpert Xpress SARS-CoV-2/FLU/RSV  testing. Fact Sheet for Patients: https://www.moore.com/ Fact Sheet for Healthcare Providers: https://www.young.biz/ This test is not yet approved or cleared by the [place] FDA and  has been authorized for detection and/or diagnosis of SARS-CoV-2 by  FDA under an Emergency Use Authorization (EUA). This EUA will remain  in effect (meaning this test can be used) for the duration of the  Covid-19 declaration under Section 564(b)(1) of the Act, 21  U.S.C. section 360bbb-3(b)(1), unless  the authorization is  terminated or revoked. Performed at [facility], [address], [place], [place] [address]   Urine culture     Status: Abnormal   Collection Time: 06/23/19  5:20 PM   Specimen: In/Out Cath Urine  Result Value Ref Range Status   Specimen Description IN/OUT CATH URINE  Final   Special Requests   Final    NONE Performed at [facility], [address], [place], [place] [address]    Culture MULTIPLE SPECIES PRESENT, SUGGEST RECOLLECTION (A)  Final   Report Status 06/25/2019 FINAL  Final  Surgical pcr screen     Status: Abnormal   Collection Time: 06/23/19  7:44 PM   Specimen: Nasal Mucosa; Nasal Swab  Result Value Ref Range Status   MRSA, PCR POSITIVE (A) NEGATIVE Final    Comment: RESULT CALLED TO, READ BACK BY AND VERIFIED WITH: J. PARRISH,CHARGE RN [id number] 06/23/2019 T. TYSOR    Staphylococcus aureus POSITIVE (A) NEGATIVE Final    Comment: (NOTE) The Xpert SA Assay (FDA approved for NASAL specimens in patients 28 years of age and older), is one component of a comprehensive surveillance program. It is not intended to diagnose infection nor to guide or monitor treatment. Performed at [facility], [address], [place], [place] [address]   Aerobic/Anaerobic Culture (surgical/deep wound)     Status: None   Collection Time: 06/24/19 12:43 PM   Specimen: PATH Soft tissue  Result Value Ref Range Status   Specimen Description ABSCESS LABIA  Final   Special Requests PT ON CEFEPIME VANC  Final   Gram Stain   Final    RARE WBC PRESENT, PREDOMINANTLY PMN ABUNDANT GRAM POSITIVE  RODS ABUNDANT GRAM NEGATIVE RODS MODERATE GRAM VARIABLE ROD FEW GRAM POSITIVE COCCI    Culture   Final    NORMAL VAGINAL FLORA NO ANAEROBES ISOLATED Performed at [facility], [address] [place], [place] [address]    Report Status 06/29/2019 FINAL  Final      Radiology Studies: [place] EKG SITE RITE  Result Date: 06/30/2019 If Site Rite image not  attached, placement could not be confirmed due to current cardiac rhythm.   Scheduled Meds: . acetaminophen  1,000 mg Oral Q6H  . vitamin C  500 mg Oral BID  . benztropine  1 mg Oral BID  . chlorhexidine  15 mL Mouth Rinse BID  . Chlorhexidine Gluconate Cloth  6 each Topical Daily  . enoxaparin (LOVENOX) injection  40 mg Subcutaneous Q24H  . feeding supplement  1 Container Oral TID BM  . feeding supplement (GLUCERNA SHAKE)  237 mL Oral TID BM  . feeding supplement (PRO-STAT SUGAR FREE 64)  30 mL Oral TID BM  . Gerhardt's butt cream   Topical BID  . haloperidol  10 mg Oral q morning - 10a  . haloperidol  20 mg Oral QHS  . insulin aspart  0-5 Units Subcutaneous QHS  . insulin aspart  0-9 Units Subcutaneous TID WC  . mouth rinse  15 mL Mouth Rinse q12n4p  . multivitamin with minerals  1 tablet Oral Daily  . mupirocin ointment  1 application Nasal BID  . nutrition supplement (JUVEN)  1 packet Oral BID BM  . pantoprazole sodium  40 mg Oral Daily  . sodium chloride flush  10-40 mL Intracatheter Q12H  . valproic acid  250 mg Oral BID  . zinc sulfate  220 mg Oral Daily   Continuous Infusions: . sodium chloride 10 mL/hr at 07/01/19 0546  . ceFEPime (MAXIPIME) IV 2 g (07/02/19 1209)  . vancomycin 1,000 mg (07/02/19 1026)     LOS: 9 days    Huey Bienenstock, MD [facility] [email or web address] 07/02/2019, 3:46 PM

## 2019-07-03 LAB — BASIC METABOLIC PANEL
Anion gap: 9 (ref 5–15)
BUN: 16 mg/dL (ref 6–20)
CO2: 25 mmol/L (ref 22–32)
Calcium: 7.9 mg/dL — ABNORMAL LOW (ref 8.9–10.3)
Chloride: 106 mmol/L (ref 98–111)
Creatinine, Ser: 0.51 mg/dL (ref 0.44–1.00)
GFR calc Af Amer: >60 mL/min (ref >60)
GFR calc non Af Amer: >60 mL/min (ref >60)
Glucose, Bld: 166 mg/dL — ABNORMAL HIGH (ref 70–99)
Potassium: 3.6 mmol/L (ref 3.5–5.1)
Sodium: 140 mmol/L (ref 135–145)

## 2019-07-03 LAB — GLUCOSE, CAPILLARY
Glucose-Capillary: 165 mg/dL — ABNORMAL HIGH (ref 70–99)
Glucose-Capillary: 178 mg/dL — ABNORMAL HIGH (ref 70–99)
Glucose-Capillary: 168 mg/dL — ABNORMAL HIGH (ref 70–99)
Glucose-Capillary: 120 mg/dL — ABNORMAL HIGH (ref 70–99)

## 2019-07-03 LAB — CBC
HCT: 27 % — ABNORMAL LOW (ref 36.0–46.0)
Hemoglobin: 8 g/dL — ABNORMAL LOW (ref 12.0–15.0)
MCH: 26.8 pg (ref 26.0–34.0)
MCHC: 29.6 g/dL — ABNORMAL LOW (ref 30.0–36.0)
MCV: 90.3 fL (ref 80.0–100.0)
Platelets: 351 10*3/uL (ref 150–400)
RBC: 2.99 MIL/uL — ABNORMAL LOW (ref 3.87–5.11)
RDW: 17.1 % — ABNORMAL HIGH (ref 11.5–15.5)
WBC: 14.8 10*3/uL — ABNORMAL HIGH (ref 4.0–10.5)
nRBC: 0.5 % — ABNORMAL HIGH (ref 0.0–0.2)

## 2019-07-03 MED ORDER — INSULIN DETEMIR 100 UNIT/ML ~~LOC~~ SOLN
5.0000 [IU] | Freq: Every day | SUBCUTANEOUS | Status: DC
  Administered 2019-07-03 – 2019-07-24 (×21): 5 [IU] via SUBCUTANEOUS
  Filled 2019-07-03 (×24): qty 0.05

## 2019-07-03 NOTE — Progress Notes (Signed)
PROGRESS NOTE  Kimberly Orozco  WFU:[id number] DOB: 1975-07-03 DOA: 06/23/2019 PCP: Patient, No Pcp Per   Brief Narrative:  Kimberly Orozco is a 44 y.o. female with a history of schizophrenia, bipolar disorder, obesity and T2DM who presented to the ED 4/12 with altered mental status found to be in DKA and with necrotizing soft tissue infection over lower abdominal wall and upper RLE extending into perineum. She was taken for extensive debridement by surgery on 4/13 and subsequently developed shock requiring pressors. Transient oliguria was noted, improved with fluid resuscitation. Taken back to ED for further debridement 4/15 and ultimately liberated from pressors and extubated 4/16. DKA has resolved and the patient has been transferred to hospitalist service on 4/18, to PCU 4/19.   Subjective: No complaints currently.  Ports she has a good bowel movement, reports he is on like hospital food, I have encouraged her to drink her supplements.  Assessment & Plan: Active Problems:   DKA (diabetic ketoacidoses) (HCC)   Necrotizing cellulitis   Septic shock (HCC)   AKI (acute kidney injury) (HCC)   Acute respiratory failure (HCC)  Septic shock due to necrotizing soft tissue infection:  - Complicated by uncontrolled hyperglycemia due to diabetes. Blood cultures negative and wound cultures w/no dominant organism. - Continue IV antibiotics including vancomycin, cefepime.  Trending down . - Continue po and IV pain control, this have been managed by general surgery - Wound care per general surgery. s/p large debridement (45cc x 30cm) on 4/13 and repeat debridement of 3-5cm additional tissue from bilateral edges 4/15.No current plans for repeat debridement, plastics consulted.  Commend patient to go to surgery, but currently patient is declining, have discussed  again with the patient, continue vitamin C, zinc. -Nutritionist consulted to address nutrition status. - Volume status stable.  Acute blood  loss anemia:  - Following extensive surgery, s/p 1u PRBCs -Trend CBC closely and transfuse as needed.  Acute hypoxic respiratory failure: s/p ETT 4/13 - 4/16.  - Continue supplemental oxygen if needed to maintain SpO2 90%.   DKA, uncontrolled T2DM: DKA resolved. HbA1c 14.4%. -He is currently on sliding scale, CBGs are controlled, will start on low-dose Levemir 5 units  - Will benefit from diabetes education once outside scope of such severe illness.  AKI: Resolved.   Poor IV access: And will require IV therapies.  - PICC left brachial 4/19  Hypokalemia:  - Supplement  Hypernatremia:  - Hydrate po, monitor.  Acute metabolic encephalopathy: Due to ICU delirium, shock, metabolic imbalances. - Delirium precautions, continue home psychotropic medications as below.  - Continue tube feeds.  Bipolar disorder, schizophrenia:  - Continue haldol, temazepam, divalproex and cogentin. Mentation improving.  - Note valproic acid level undetectable at admission.  Loose stools: In setting of tube feeds.  - DCed clindamycin - Abd benign. Will follow exam, fever curve, leukocytosis, and output.  - Can DC flexiseal if/when stools no longer loose, continues to need this.  RN Pressure Injury Documentation: Pressure Injury 06/23/19 Buttocks Left (Active)  06/23/19 1945  Location: Buttocks  Location Orientation: Left  Staging:   Wound Description (Comments):   Present on Admission: Yes    Obesity: Estimated body mass index is 42.99 kg/m as calculated from the following:   Height as of this encounter: 5\' 7"  (1.702 m).   Weight as of this encounter: 124.5 kg.  DVT prophylaxis: Lovenox Code Status: Full Family Communication: None at bedside.  I did call mother by phone today, she was unavailable Disposition Plan:  Status  is: Inpatient  Remains inpatient appropriate because: She remains on IV antibiotics, remains with massive wound requiring further debridement.   Dispo: The patient is  from: Home              Anticipated d/c is to: CIR              Anticipated d/c date is: > 3 days              Patient currently is not medically stable to d/c.  Consultants:   PCCM  General surgery  Plastic surgery  Procedures:   Central line placement 4/14  ETT 4/13 - 4/16 06/24/19 IRRIGATION AND DEBRIDEMENT RIGHT GROIN AND RIGHT BUTTOCK Axel Filler, MD  06/24/19 IRRIGATION AND DEBRIDEMENT OF Archie Patten Axel Filler, MD  06/26/19 IRRIGATION AND DEBRIDEMENT ABDOMINAL WALL AND GROIN       Antimicrobials: Metronidazole x1 (4/12) Cefepime (4/12) >> Vancomycin (4/12) >> Clindamycin (4/12) - 4/18    Objective: Vitals:   07/03/19 0406 07/03/19 0420 07/03/19 0500 07/03/19 0741  BP: 120/82   105/69  Pulse: (!) 110 (!) 116  (!) 116  Resp: (!) 25 20  19   Temp: 97.9 F (36.6 C)   98.4 F (36.9 C)  TempSrc: Oral   Axillary  SpO2: 97% 100%  98%  Weight:   124.5 kg   Height:        Intake/Output Summary (Last 24 hours) at 07/03/2019 1514 Last data filed at 07/03/2019 0700 Gross per 24 hour  Intake 786.82 ml  Output 450 ml  Net 336.82 ml   Filed Weights   07/01/19 0500 07/02/19 0339 07/03/19 0500  Weight: 121.3 kg 124.4 kg 124.5 kg     Awake Alert, Oriented X 3, No new F.N deficits, Normal affect Symmetrical Chest wall movement, Good air movement bilaterally, CTAB RRR,No Gallops,Rubs or new Murmurs, No Parasternal Heave +ve B.Sounds, Abd Soft, No tenderness, No rebound - guarding or rigidity. No Cyanosis, Clubbing or edema,    Data Reviewed: I have personally reviewed following labs and imaging studies  CBC: Recent Labs  Lab 06/29/19 0633 06/30/19 0409 07/01/19 0517 07/02/19 0306 07/03/19 0405  WBC 15.7* 18.7* 19.5* 17.3* 14.8*  HGB 6.8* 8.5* 8.1* 8.4* 8.0*  HCT 21.7* 27.0* 26.1* 27.0* 27.0*  MCV 87.5 88.5 87.9 89.1 90.3  PLT 166 226 229 273 351   Basic Metabolic Panel: Recent Labs  Lab 06/27/19 0413 06/27/19 2030 06/28/19 0431 06/28/19 0431  06/28/19 2044 06/29/19 0633 06/30/19 0409 07/01/19 0517 07/03/19 0405  NA 143   < > 147*   < > 143 148* 146* 141 140  K 3.1*   < > 3.3*   < > 3.8 3.4* 3.7 3.5 3.6  CL 110   < > 116*   < > 114* 115* 111 106 106  CO2 23   < > 25   < > 25 26 25 26 25   GLUCOSE 188*   < > 102*   < > 217* 98 156* 96 166*  BUN 15   < > 16   < > 21* 20 19 11 16   CREATININE 0.59   < > 0.56   < > 0.67 0.62 0.56 0.48 0.51  CALCIUM 7.1*   < > 7.0*   < > 6.9* 7.3* 7.7* 7.7* 7.9*  MG 1.6*  --  2.0  --   --  2.2 2.2  --   --   PHOS 2.8  --  2.6  --   --  2.5 2.5  --   --    < > = values in this interval not displayed.   GFR: Estimated Creatinine Clearance: 123 mL/min (by C-G formula based on SCr of 0.51 mg/dL). Liver Function Tests: Recent Labs  Lab 06/30/19 0409  AST 30  ALT 29  ALKPHOS 129*  BILITOT 0.3  PROT 6.2*  ALBUMIN 1.2*   No results for input(s): LIPASE, AMYLASE in the last 168 hours. No results for input(s): AMMONIA in the last 168 hours. Coagulation Profile: No results for input(s): INR, PROTIME in the last 168 hours. Cardiac Enzymes: No results for input(s): CKTOTAL, CKMB, CKMBINDEX, TROPONINI in the last 168 hours. BNP (last 3 results) No results for input(s): PROBNP in the last 8760 hours. HbA1C: No results for input(s): HGBA1C in the last 72 hours. CBG: Recent Labs  Lab 07/02/19 1158 07/02/19 1633 07/02/19 2103 07/03/19 0738 07/03/19 1137  GLUCAP 215* 202* 157* 165* 178*   Lipid Profile: No results for input(s): CHOL, HDL, LDLCALC, TRIG, CHOLHDL, LDLDIRECT in the last 72 hours. Thyroid Function Tests: No results for input(s): TSH, T4TOTAL, FREET4, T3FREE, THYROIDAB in the last 72 hours. Anemia Panel: No results for input(s): VITAMINB12, FOLATE, FERRITIN, TIBC, IRON, RETICCTPCT in the last 72 hours. Urine analysis:    Component Value Date/Time   COLORURINE YELLOW 06/23/2019 1715   APPEARANCEUR CLOUDY (A) 06/23/2019 1715   LABSPEC 1.028 06/23/2019 1715   PHURINE 6.0  06/23/2019 1715   GLUCOSEU >=500 (A) 06/23/2019 1715   HGBUR LARGE (A) 06/23/2019 1715   BILIRUBINUR NEGATIVE 06/23/2019 1715   KETONESUR 20 (A) 06/23/2019 1715   PROTEINUR 30 (A) 06/23/2019 1715   NITRITE NEGATIVE 06/23/2019 1715   LEUKOCYTESUR LARGE (A) 06/23/2019 1715   Recent Results (from the past 240 hour(s))  Respiratory Panel by RT PCR (Flu A&B, Covid) - Nasopharyngeal Swab     Status: None   Collection Time: 06/23/19  3:21 PM   Specimen: Nasopharyngeal Swab  Result Value Ref Range Status   SARS Coronavirus 2 by RT PCR NEGATIVE NEGATIVE Final    Comment: (NOTE) SARS-CoV-2 target nucleic acids are NOT DETECTED. The SARS-CoV-2 RNA is generally detectable in upper respiratoy specimens during the acute phase of infection. The lowest concentration of SARS-CoV-2 viral copies this assay can detect is 131 copies/mL. A negative result does not preclude SARS-Cov-2 infection and should not be used as the sole basis for treatment or other patient management decisions. A negative result may occur with  improper specimen collection/handling, submission of specimen other than nasopharyngeal swab, presence of viral mutation(s) within the areas targeted by this assay, and inadequate number of viral copies (<131 copies/mL). A negative result must be combined with clinical observations, patient history, and epidemiological information. The expected result is Negative. Fact Sheet for Patients:  PinkCheek.be Fact Sheet for Healthcare Providers:  GravelBags.it This test is not yet ap proved or cleared by the [place] FDA and  has been authorized for detection and/or diagnosis of SARS-CoV-2 by FDA under an Emergency Use Authorization (EUA). This EUA will remain  in effect (meaning this test can be used) for the duration of the COVID-19 declaration under Section 564(b)(1) of the Act, 21 U.S.C. section 360bbb-3(b)(1), unless the  authorization is terminated or revoked sooner.    Influenza A by PCR NEGATIVE NEGATIVE Final   Influenza B by PCR NEGATIVE NEGATIVE Final    Comment: (NOTE) The Xpert Xpress SARS-CoV-2/FLU/RSV assay is intended as an aid in  the diagnosis of influenza from Nasopharyngeal  swab specimens and  should not be used as a sole basis for treatment. Nasal washings and  aspirates are unacceptable for Xpert Xpress SARS-CoV-2/FLU/RSV  testing. Fact Sheet for Patients: https://www.moore.com/https://www.fda.gov/media/142436/download Fact Sheet for Healthcare Providers: https://www.young.biz/https://www.fda.gov/media/142435/download This test is not yet approved or cleared by the [place] FDA and  has been authorized for detection and/or diagnosis of SARS-CoV-2 by  FDA under an Emergency Use Authorization (EUA). This EUA will remain  in effect (meaning this test can be used) for the duration of the  Covid-19 declaration under Section 564(b)(1) of the Act, 21  U.S.C. section 360bbb-3(b)(1), unless the authorization is  terminated or revoked. Performed at [facility], [address] [place], [place] [address]   Urine culture     Status: Abnormal   Collection Time: 06/23/19  5:20 PM   Specimen: In/Out Cath Urine  Result Value Ref Range Status   Specimen Description IN/OUT CATH URINE  Final   Special Requests   Final    NONE Performed at [facility], [address] [place], [place] [address]    Culture MULTIPLE SPECIES PRESENT, SUGGEST RECOLLECTION (A)  Final   Report Status 06/25/2019 FINAL  Final  Surgical pcr screen     Status: Abnormal   Collection Time: 06/23/19  7:44 PM   Specimen: Nasal Mucosa; Nasal Swab  Result Value Ref Range Status   MRSA, PCR POSITIVE (A) NEGATIVE Final    Comment: RESULT CALLED TO, READ BACK BY AND VERIFIED WITH: J. PARRISH,CHARGE RN [id number] 06/23/2019 T. TYSOR    Staphylococcus aureus POSITIVE (A) NEGATIVE Final    Comment: (NOTE) The Xpert SA Assay (FDA approved for NASAL specimens in  patients 45 years of age and older), is one component of a comprehensive surveillance program. It is not intended to diagnose infection nor to guide or monitor treatment. Performed at [facility], [address] [place], [place] [address]   Aerobic/Anaerobic Culture (surgical/deep wound)     Status: None   Collection Time: 06/24/19 12:43 PM   Specimen: PATH Soft tissue  Result Value Ref Range Status   Specimen Description ABSCESS LABIA  Final   Special Requests PT ON CEFEPIME VANC  Final   Gram Stain   Final    RARE WBC PRESENT, PREDOMINANTLY PMN ABUNDANT GRAM POSITIVE RODS ABUNDANT GRAM NEGATIVE RODS MODERATE GRAM VARIABLE ROD FEW GRAM POSITIVE COCCI    Culture   Final    NORMAL VAGINAL FLORA NO ANAEROBES ISOLATED Performed at [facility], [address] [place], [place] [address]    Report Status 06/29/2019 FINAL  Final      Radiology Studies: No results found.  Scheduled Meds: . acetaminophen  1,000 mg Oral Q6H  . vitamin C  500 mg Oral BID  . benztropine  1 mg Oral BID  . chlorhexidine  15 mL Mouth Rinse BID  . Chlorhexidine Gluconate Cloth  6 each Topical Daily  . enoxaparin (LOVENOX) injection  40 mg Subcutaneous Q24H  . feeding supplement  1 Container Oral TID BM  . feeding supplement (GLUCERNA SHAKE)  237 mL Oral TID BM  . feeding supplement (PRO-STAT SUGAR FREE 64)  30 mL Oral TID BM  . Gerhardt's butt cream   Topical BID  . haloperidol  10 mg Oral q morning - 10a  . haloperidol  20 mg Oral QHS  . insulin aspart  0-5 Units Subcutaneous QHS  . insulin aspart  0-9 Units Subcutaneous TID WC  .  mouth rinse  15 mL Mouth Rinse q12n4p  . multivitamin with minerals  1 tablet Oral Daily  . mupirocin ointment  1 application Nasal BID  . nutrition supplement (JUVEN)  1 packet Oral BID BM  . pantoprazole sodium  40 mg Oral Daily  . sodium chloride flush  10-40 mL Intracatheter Q12H  . valproic acid  250 mg Oral BID  . zinc sulfate  220 mg Oral Daily     Continuous Infusions: . sodium chloride 250 mL (07/03/19 0659)  . ceFEPime (MAXIPIME) IV 2 g (07/03/19 1125)  . vancomycin 1,000 mg (07/03/19 0928)     LOS: 10 days    Huey Bienenstock, MD [facility] [email or web address] 07/03/2019, 3:14 PM

## 2019-07-03 NOTE — Progress Notes (Signed)
Physical Therapy Treatment Patient Details Name: Kimberly Orozco MRN: [id number] DOB: 03-28-1975 Today's Date: 07/03/2019    History of Present Illness 44 year old female admitted 06/23/19 with altered mental status and found to be in DKA with necrotizing soft tissue infection lower abdominal wall and upper RLE extending into perineum. Patient had extensive debridement by surgery 06/24/19 and developed shock requiring pressors. Repeat debridement 06/26/19, no longer requiring pressors and extubated 06/27/19. BKA resolved and patient transferred to hospitalist service 4/18, PCU 4/19. Plastic surgery consulted. Patient declining surgery at this time. PMH: schizophrenia, bipolar disorder, obesity and T2DM    PT Comments    Patient required less assistance for bed mobility and transfer to chair today compared to yesterday. Two person assist for physical assist to sit EOB and for sit>stand from EOB as well as for safety and due to multiple/lines and tubes. Pain during mobility continues to limit patient. Discussed with RN. Patient has scheduled meds and prn meds. Patient declines pain meds at rest. Recommend continued skilled PT and discharge to SNF for short term rehabilitation vs CIR.   Follow Up Recommendations  SNF;CIR     Equipment Recommendations  Other (comment);Wheelchair (measurements PT);Wheelchair cushion (measurements PT);3in1 (PT);Rolling walker with 5" wheels;Hospital bed       Precautions / Restrictions Precautions Precautions: Fall;Other (comment) Precaution Comments: L PICC, HOB>30 degrees, rectal tube, catheter, abdominal wound Restrictions Weight Bearing Restrictions: No    Mobility  Bed Mobility Overal bed mobility: Needs Assistance Bed Mobility: Supine to Sit     Supine to sit: Mod assist;Min assist;+2 for physical assistance;+2 for safety/equipment;HOB elevated     General bed mobility comments: Use of bedrail.  Transfers Overall transfer level: Needs  assistance Equipment used: Rolling walker (2 wheeled) Transfers: Sit to/from Stand Sit to Stand: Mod assist;+2 physical assistance;+2 safety/equipment;From elevated surface         General transfer comment: Cues for hand and foot placement. First attempt patient unable to stand due to stopping transfer prematurely.  Ambulation/Gait Ambulation/Gait assistance: Min assist Gait Distance (Feet): 4 Feet Assistive device: Rolling walker (2 wheeled) Gait Pattern/deviations: Decreased step length - right;Decreased step length - left Gait velocity: decreased   General Gait Details: Patient ambulated a few feet to the chair. She declined further ambulation with RW and chair follow due to pain.    Balance Overall balance assessment: Needs assistance Sitting-balance support: Feet supported Sitting balance-Leahy Scale: (Fair+)     Standing balance support: Bilateral upper extremity supported   Standing balance comment: contact guard assistance with RW for static standing balance    Cognition Arousal/Alertness: Awake/alert;Lethargic    General Comments: Lethargic at first but once started moving, alert and awake     Exercises General Exercises - Lower Extremity Ankle Circles/Pumps: Other (comment)(education on ankle pumps and LAQ to tolerance while seated) Long Arc Quad: Other (comment)(education on ankle pumps and LAQ to tolerance while seated)        Pertinent Vitals/Pain Pain Assessment: 0-10 Pain Score: ("12/10") Pain Location: abdomen Pain Descriptors / Indicators: Pressure Pain Intervention(s): Limited activity within patient's tolerance;Monitored during session;Repositioned;Other (comment)(patient declined pain medication by RN at end of session)           PT Goals (current goals can now be found in the care plan section) Acute Rehab PT Goals Patient Stated Goal: to do things for herself Progress towards PT goals: Progressing toward goals    Frequency    Min  3X/week      PT Plan Discharge  plan needs to be updated       AM-PAC PT "6 Clicks" Mobility   Outcome Measure  Help needed turning from your back to your side while in a flat bed without using bedrails?: A Lot Help needed moving from lying on your back to sitting on the side of a flat bed without using bedrails?: A Lot Help needed moving to and from a bed to a chair (including a wheelchair)?: A Lot Help needed standing up from a chair using your arms (e.g., wheelchair or bedside chair)?: A Lot Help needed to walk in hospital room?: A Lot Help needed climbing 3-5 steps with a railing? : Total 6 Click Score: 11    End of Session Equipment Utilized During Treatment: Gait belt((up high so not on abdominal wound)) Activity Tolerance: No increased pain;Patient limited by fatigue Patient left: in chair;with call bell/phone within reach;with chair alarm set Nurse Communication: Mobility status;Other (comment)(pain limiting mobility) PT Visit Diagnosis: Other abnormalities of gait and mobility (R26.89);Muscle weakness (generalized) (M62.81);Pain     Time: 1040-1107 PT Time Calculation (min) (ACUTE ONLY): 27 min  Charges:  $Therapeutic Activity: 23-37 mins                     Angelene Giovanni, PT, DPT Acute Rehab [phone] office     Angelene Giovanni 07/03/2019, 1:50 PM

## 2019-07-04 DIAGNOSIS — E876 Hypokalemia: Secondary | ICD-10-CM

## 2019-07-04 LAB — CBC
HCT: 25.1 % — ABNORMAL LOW (ref 36.0–46.0)
Hemoglobin: 7.5 g/dL — ABNORMAL LOW (ref 12.0–15.0)
MCH: 26.6 pg (ref 26.0–34.0)
MCHC: 29.9 g/dL — ABNORMAL LOW (ref 30.0–36.0)
MCV: 89 fL (ref 80.0–100.0)
Platelets: 385 10*3/uL (ref 150–400)
RBC: 2.82 MIL/uL — ABNORMAL LOW (ref 3.87–5.11)
RDW: 17.3 % — ABNORMAL HIGH (ref 11.5–15.5)
WBC: 11.3 10*3/uL — ABNORMAL HIGH (ref 4.0–10.5)
nRBC: 0.3 % — ABNORMAL HIGH (ref 0.0–0.2)

## 2019-07-04 LAB — BASIC METABOLIC PANEL
Anion gap: 9 (ref 5–15)
BUN: 11 mg/dL (ref 6–20)
CO2: 23 mmol/L (ref 22–32)
Calcium: 7.6 mg/dL — ABNORMAL LOW (ref 8.9–10.3)
Chloride: 104 mmol/L (ref 98–111)
Creatinine, Ser: 0.56 mg/dL (ref 0.44–1.00)
GFR calc Af Amer: >60 mL/min (ref >60)
GFR calc non Af Amer: >60 mL/min (ref >60)
Glucose, Bld: 123 mg/dL — ABNORMAL HIGH (ref 70–99)
Potassium: 3.4 mmol/L — ABNORMAL LOW (ref 3.5–5.1)
Sodium: 136 mmol/L (ref 135–145)

## 2019-07-04 LAB — GLUCOSE, CAPILLARY
Glucose-Capillary: 129 mg/dL — ABNORMAL HIGH (ref 70–99)
Glucose-Capillary: 112 mg/dL — ABNORMAL HIGH (ref 70–99)
Glucose-Capillary: 113 mg/dL — ABNORMAL HIGH (ref 70–99)

## 2019-07-04 MED ORDER — POTASSIUM CHLORIDE CRYS ER 20 MEQ PO TBCR
40.0000 meq | EXTENDED_RELEASE_TABLET | Freq: Once | ORAL | Status: AC
  Administered 2019-07-04: 40 meq via ORAL
  Filled 2019-07-04: qty 2

## 2019-07-04 NOTE — Progress Notes (Signed)
PROGRESS NOTE  Kimberly Orozco  XVQ:[id number] DOB: Apr 24, 1975 DOA: 06/23/2019 PCP: Patient, No Pcp Per   Brief Narrative:  Kimberly Orozco is a 44 y.o. female with a history of schizophrenia, bipolar disorder, obesity and T2DM who presented to the ED 4/12 with altered mental status found to be in DKA and with necrotizing soft tissue infection over lower abdominal wall and upper RLE extending into perineum. She was taken for extensive debridement by surgery on 4/13 and subsequently developed shock requiring pressors. Transient oliguria was noted, improved with fluid resuscitation. Taken back to ED for further debridement 4/15 and ultimately liberated from pressors and extubated 4/16. DKA has resolved and the patient has been transferred to hospitalist service on 4/18, to PCU 4/19.   Subjective:  Still reports some pain at surgical site, at baseline, overall controlled , reports good bowel movement, otherwise denies any complaints .  Assessment & Plan: Active Problems:   DKA (diabetic ketoacidoses) (HCC)   Necrotizing cellulitis   Septic shock (HCC)   AKI (acute kidney injury) (HCC)   Acute respiratory failure (HCC)  Septic shock due to necrotizing soft tissue infection:  - Complicated by uncontrolled hyperglycemia due to diabetes. Blood cultures negative and wound cultures w/no dominant organism. - Continue IV antibiotics including vancomycin, cefepime.  Cultures are inconclusive, appears to be improving per general surgery, currently antibiotic stopped 4/23. - Continue po and IV pain control, this have been managed by general surgery - Wound care per general surgery. s/p large debridement (45cc x 30cm) on 4/13 and repeat debridement of 3-5cm additional tissue from bilateral edges 4/15.No current plans for repeat debridement, plastics consulted.  Commend patient to go to surgery, for matrix placement, patient declined this morning, but she is considering it over the weekend, continue with  wound dressing per general surgery recommendation, has increased to 3 times daily, -, continue vitamin C, zinc. -Nutritionist consulted to address nutrition status.  Acute blood loss anemia:  - Following extensive surgery, s/p 1u PRBCs -Trend CBC closely and transfuse as needed.  Been a 7.5 today, so far no indication for Transfusion.  Acute hypoxic respiratory failure: s/p ETT 4/13 - 4/16.  - Continue supplemental oxygen if needed to maintain SpO2 90%.   DKA, uncontrolled T2DM: DKA resolved. HbA1c 14.4%. -He is currently on sliding scale, CBGs are controlled, will start on low-dose Levemir 5 units  - Will benefit from diabetes education once outside scope of such severe illness.  AKI: Resolved.   Poor IV access: And will require IV therapies.  - PICC left brachial 4/19  Hypokalemia:  -potassium 3.4 this morning, repleted.  Hypernatremia:  - Hydrate po, monitor.  Acute metabolic encephalopathy: Due to ICU delirium, shock, metabolic imbalances. - Delirium precautions, continue home psychotropic medications as below.  - Continue tube feeds.  Bipolar disorder, schizophrenia:  - Continue haldol, temazepam, divalproex and cogentin. Mentation improving.  - Note valproic acid level undetectable at admission.  Loose stools: In setting of tube feeds.  - DCed clindamycin - Abd benign. Will follow exam, fever curve, leukocytosis, and output.  - Can DC flexiseal if/when stools no longer loose, continues to need this.  RN Pressure Injury Documentation: Pressure Injury 06/23/19 Buttocks Left (Active)  06/23/19 1945  Location: Buttocks  Location Orientation: Left  Staging:   Wound Description (Comments):   Present on Admission: Yes    Obesity: Estimated body mass index is 41.64 kg/m as calculated from the following:   Height as of this encounter: 5\' 7"  (1.702 m).  Weight as of this encounter: 120.6 kg.  DVT prophylaxis: Lovenox Code Status: Full Family Communication: None at  bedside.  I did call mother by phone today, she was unavailable Disposition Plan:  Status is: Inpatient  Remains inpatient appropriate because: She remains on IV antibiotics, remains with massive wound requiring further debridement.   Dispo: The patient is from: Home              Anticipated d/c is to: CIR              Anticipated d/c date is: > 3 days              Patient currently is not medically stable to d/c.  Consultants:   PCCM  General surgery  Plastic surgery  Procedures:   Central line placement 4/14  ETT 4/13 - 4/16 06/24/19 IRRIGATION AND DEBRIDEMENT RIGHT GROIN AND RIGHT BUTTOCK Axel Filler, MD  06/24/19 IRRIGATION AND DEBRIDEMENT OF Archie Patten Axel Filler, MD  06/26/19 IRRIGATION AND DEBRIDEMENT ABDOMINAL WALL AND GROIN       Antimicrobials: Metronidazole x1 (4/12) Cefepime (4/12) >> Vancomycin (4/12) >> Clindamycin (4/12) - 4/18    Objective: Vitals:   07/04/19 0203 07/04/19 0208 07/04/19 0302 07/04/19 0800  BP:   103/65 140/80  Pulse: (!) 111 (!) 111 (!) 112   Resp: (!) 22 (!) 25 (!) 24 20  Temp:   98.8 F (37.1 C) 98.1 F (36.7 C)  TempSrc:   Oral Axillary  SpO2: 100% 99% 99% 100%  Weight:   120.6 kg   Height:        Intake/Output Summary (Last 24 hours) at 07/04/2019 1449 Last data filed at 07/04/2019 0305 Gross per 24 hour  Intake 543.91 ml  Output 400 ml  Net 143.91 ml   Filed Weights   07/02/19 0339 07/03/19 0500 07/04/19 0302  Weight: 124.4 kg 124.5 kg 120.6 kg     Awake Alert, Oriented X 3, No new F.N deficits, Normal affect Symmetrical Chest wall movement, Good air movement bilaterally, CTAB RRR,No Gallops,Rubs or new Murmurs, No Parasternal Heave +ve B.Sounds, Abd Soft, No tenderness, No rebound - guarding or rigidity. No Cyanosis, Clubbing or edema,      Data Reviewed: I have personally reviewed following labs and imaging studies  CBC: Recent Labs  Lab 06/30/19 0409 07/01/19 0517 07/02/19 0306 07/03/19 0405  07/04/19 0317  WBC 18.7* 19.5* 17.3* 14.8* 11.3*  HGB 8.5* 8.1* 8.4* 8.0* 7.5*  HCT 27.0* 26.1* 27.0* 27.0* 25.1*  MCV 88.5 87.9 89.1 90.3 89.0  PLT 226 229 273 351 385   Basic Metabolic Panel: Recent Labs  Lab 06/28/19 0431 06/28/19 2044 06/29/19 0633 06/30/19 0409 07/01/19 0517 07/03/19 0405 07/04/19 0317  NA 147*   < > 148* 146* 141 140 136  K 3.3*   < > 3.4* 3.7 3.5 3.6 3.4*  CL 116*   < > 115* 111 106 106 104  CO2 25   < > 26 25 26 25 23   GLUCOSE 102*   < > 98 156* 96 166* 123*  BUN 16   < > 20 19 11 16 11   CREATININE 0.56   < > 0.62 0.56 0.48 0.51 0.56  CALCIUM 7.0*   < > 7.3* 7.7* 7.7* 7.9* 7.6*  MG 2.0  --  2.2 2.2  --   --   --   PHOS 2.6  --  2.5 2.5  --   --   --    < > =  values in this interval not displayed.   GFR: Estimated Creatinine Clearance: 120.7 mL/min (by C-G formula based on SCr of 0.56 mg/dL). Liver Function Tests: Recent Labs  Lab 06/30/19 0409  AST 30  ALT 29  ALKPHOS 129*  BILITOT 0.3  PROT 6.2*  ALBUMIN 1.2*   No results for input(s): LIPASE, AMYLASE in the last 168 hours. No results for input(s): AMMONIA in the last 168 hours. Coagulation Profile: No results for input(s): INR, PROTIME in the last 168 hours. Cardiac Enzymes: No results for input(s): CKTOTAL, CKMB, CKMBINDEX, TROPONINI in the last 168 hours. BNP (last 3 results) No results for input(s): PROBNP in the last 8760 hours. HbA1C: No results for input(s): HGBA1C in the last 72 hours. CBG: Recent Labs  Lab 07/03/19 0738 07/03/19 1137 07/03/19 1716 07/03/19 2211 07/04/19 1156  GLUCAP 165* 178* 168* 120* 129*   Lipid Profile: No results for input(s): CHOL, HDL, LDLCALC, TRIG, CHOLHDL, LDLDIRECT in the last 72 hours. Thyroid Function Tests: No results for input(s): TSH, T4TOTAL, FREET4, T3FREE, THYROIDAB in the last 72 hours. Anemia Panel: No results for input(s): VITAMINB12, FOLATE, FERRITIN, TIBC, IRON, RETICCTPCT in the last 72 hours. Urine analysis:      Component Value Date/Time   COLORURINE YELLOW 06/23/2019 1715   APPEARANCEUR CLOUDY (A) 06/23/2019 1715   LABSPEC 1.028 06/23/2019 1715   PHURINE 6.0 06/23/2019 1715   GLUCOSEU >=500 (A) 06/23/2019 1715   HGBUR LARGE (A) 06/23/2019 1715   BILIRUBINUR NEGATIVE 06/23/2019 1715   KETONESUR 20 (A) 06/23/2019 1715   PROTEINUR 30 (A) 06/23/2019 1715   NITRITE NEGATIVE 06/23/2019 1715   LEUKOCYTESUR LARGE (A) 06/23/2019 1715   No results found for this or any previous visit (from the past 240 hour(s)).    Radiology Studies: No results found.  Scheduled Meds: . acetaminophen  1,000 mg Oral Q6H  . vitamin C  500 mg Oral BID  . benztropine  1 mg Oral BID  . chlorhexidine  15 mL Mouth Rinse BID  . Chlorhexidine Gluconate Cloth  6 each Topical Daily  . enoxaparin (LOVENOX) injection  40 mg Subcutaneous Q24H  . feeding supplement  1 Container Oral TID BM  . feeding supplement (GLUCERNA SHAKE)  237 mL Oral TID BM  . feeding supplement (PRO-STAT SUGAR FREE 64)  30 mL Oral TID BM  . Gerhardt's butt cream   Topical BID  . haloperidol  10 mg Oral q morning - 10a  . haloperidol  20 mg Oral QHS  . insulin aspart  0-5 Units Subcutaneous QHS  . insulin aspart  0-9 Units Subcutaneous TID WC  . insulin detemir  5 Units Subcutaneous Daily  . mouth rinse  15 mL Mouth Rinse q12n4p  . multivitamin with minerals  1 tablet Oral Daily  . mupirocin ointment  1 application Nasal BID  . nutrition supplement (JUVEN)  1 packet Oral BID BM  . pantoprazole sodium  40 mg Oral Daily  . sodium chloride flush  10-40 mL Intracatheter Q12H  . valproic acid  250 mg Oral BID  . zinc sulfate  220 mg Oral Daily   Continuous Infusions: . sodium chloride Stopped (07/03/19 2246)     LOS: 11 days    Phillips Climes, MD [facility] [email or web address] 07/04/2019, 2:49 PM

## 2019-07-04 NOTE — Progress Notes (Signed)
8 Days Post-Op    CC:  AMS/soft tissue necrosis  Subjective: Pt says dressing changed last PM.  On removing dressing it had a fair amount of purulent drainage on the open wound and dressing.  No other new change.  See picture below  Objective: Vital signs in last 24 hours: Temp:  [98.4 F (36.9 C)-98.9 F (37.2 C)] 98.8 F (37.1 C) (04/23 0302) Pulse Rate:  [111-119] 112 (04/23 0302) Resp:  [19-27] 24 (04/23 0302) BP: (103-109)/(65-69) 103/65 (04/23 0302) SpO2:  [98 %-100 %] 99 % (04/23 0302) Weight:  [120.6 kg] 120.6 kg (04/23 0302) Last BM Date: 07/03/19 543 IV recorded, no PO recorded Urine 543 BM x 2 Afebrile, tachycardic K+ 3.4 WBC 11.3 H/H 7.5/25.1  Intake/Output from previous day: 04/22 0701 - 04/23 0700 In: 543.9 [I.V.:23.7; IV Piggyback:520.2] Out: 400 [Urine:400] Intake/Output this shift: No intake/output data recorded.  General appearance: lethargic but answering questions.  retraction of the panus is very uncomfortable. Skin: see picture below  Some undermining at the top of the wound. Lab Results:  Recent Labs    07/03/19 0405 07/04/19 0317  WBC 14.8* 11.3*  HGB 8.0* 7.5*  HCT 27.0* 25.1*  PLT 351 385    BMET Recent Labs    07/03/19 0405 07/04/19 0317  NA 140 136  K 3.6 3.4*  CL 106 104  CO2 25 23  GLUCOSE 166* 123*  BUN 16 11  CREATININE 0.51 0.56  CALCIUM 7.9* 7.6*   PT/INR No results for input(s): LABPROT, INR in the last 72 hours.  Recent Labs  Lab 06/30/19 0409  AST 30  ALT 29  ALKPHOS 129*  BILITOT 0.3  PROT 6.2*  ALBUMIN 1.2*     Lipase  No results found for: LIPASE   Medications: . acetaminophen  1,000 mg Oral Q6H  . vitamin C  500 mg Oral BID  . benztropine  1 mg Oral BID  . chlorhexidine  15 mL Mouth Rinse BID  . Chlorhexidine Gluconate Cloth  6 each Topical Daily  . enoxaparin (LOVENOX) injection  40 mg Subcutaneous Q24H  . feeding supplement  1 Container Oral TID BM  . feeding supplement (GLUCERNA SHAKE)   237 mL Oral TID BM  . feeding supplement (PRO-STAT SUGAR FREE 64)  30 mL Oral TID BM  . Gerhardt's butt cream   Topical BID  . haloperidol  10 mg Oral q morning - 10a  . haloperidol  20 mg Oral QHS  . insulin aspart  0-5 Units Subcutaneous QHS  . insulin aspart  0-9 Units Subcutaneous TID WC  . insulin detemir  5 Units Subcutaneous Daily  . mouth rinse  15 mL Mouth Rinse q12n4p  . multivitamin with minerals  1 tablet Oral Daily  . mupirocin ointment  1 application Nasal BID  . nutrition supplement (JUVEN)  1 packet Oral BID BM  . pantoprazole sodium  40 mg Oral Daily  . sodium chloride flush  10-40 mL Intracatheter Q12H  . valproic acid  250 mg Oral BID  . zinc sulfate  220 mg Oral Daily    Assessment/Plan T2DMwith DKA  Acute renal failure - Crnormal, 0.48 (4/20) Hypokalemia  Schizophrenia Bipolar disorder ABL anemia -hgb 8.4 >>7.5  Necrotizing fasciitis of abdominal wall and groin S/P I&D 45 x 30 x 4 cm 06/24/19 and 4/15 by Dr. Rosendo Gros - Noneedto take back to the OR at this time.  - increase dressing changes at the bedside to TID;  Consider hydrotherapy next  week if TID dressing changes don't improve site purulent drainage.  - Cont abx for now. Reassess tomorrow. Cx's inconclusive. - Consult Plastic Surgery - patient declines surgery at this time. Will continue to discuss with patient.   FEN:Reg VTE: SCDs, Lovenox  ID: Flagyl 4/12; Clinda 4/13 - 4/18 . Cefepime/Vanc 4/12>> Afebrile, WBC downtrending, 17.3 >> 14.8>>11.3.  Foley - in place to aid in wound healing; incontinent of stool Follow-Up - TBD  Plan:  Increase dressing changes to TID.  Pt and Dr. Janee Morn discussed hydrotherapy and plastic placing wound matrix on the site.  Hopefully she will reconsider Plastics recommendation over the weekend.   We will see her again on Monday.        LOS: 11 days    Kimberly Orozco 07/04/2019 Please see Amion

## 2019-07-04 NOTE — Progress Notes (Signed)
Patient is a 44 yr-old female with a large abdominal wall and groin wound s/p/ necrotizing fascitis. PMH of schizophrenia, bipolar, disorder, obestiy, T2DM who had presented to ED with AMS and found to be in diabetic ketoacidosis complicated by necrotizing soft tissue infection.   Today patient is laying in bed eating breakfast. She states that she has not changed her mind and still does not want to move forward with surgery.  Patient was asked if she had any questions I could answer for her about the surgery and responded that she did not.    Plastics remains engaged and ready to move forward with surgery if patient decides she is willing to do so.

## 2019-07-04 NOTE — Progress Notes (Signed)
CSW following for potential discharge needs as patient progresses.   Herberta Pickron LCSW

## 2019-07-05 LAB — BASIC METABOLIC PANEL
Anion gap: 7 (ref 5–15)
BUN: 8 mg/dL (ref 6–20)
CO2: 26 mmol/L (ref 22–32)
Calcium: 7.4 mg/dL — ABNORMAL LOW (ref 8.9–10.3)
Chloride: 105 mmol/L (ref 98–111)
Creatinine, Ser: 0.5 mg/dL (ref 0.44–1.00)
GFR calc Af Amer: >60 mL/min (ref >60)
GFR calc non Af Amer: >60 mL/min (ref >60)
Glucose, Bld: 108 mg/dL — ABNORMAL HIGH (ref 70–99)
Potassium: 3.4 mmol/L — ABNORMAL LOW (ref 3.5–5.1)
Sodium: 138 mmol/L (ref 135–145)

## 2019-07-05 LAB — CBC
HCT: 25.2 % — ABNORMAL LOW (ref 36.0–46.0)
Hemoglobin: 7.5 g/dL — ABNORMAL LOW (ref 12.0–15.0)
MCH: 26.8 pg (ref 26.0–34.0)
MCHC: 29.8 g/dL — ABNORMAL LOW (ref 30.0–36.0)
MCV: 90 fL (ref 80.0–100.0)
Platelets: 472 10*3/uL — ABNORMAL HIGH (ref 150–400)
RBC: 2.8 MIL/uL — ABNORMAL LOW (ref 3.87–5.11)
RDW: 17.3 % — ABNORMAL HIGH (ref 11.5–15.5)
WBC: 8.6 10*3/uL (ref 4.0–10.5)
nRBC: 0.2 % (ref 0.0–0.2)

## 2019-07-05 LAB — GLUCOSE, CAPILLARY
Glucose-Capillary: 139 mg/dL — ABNORMAL HIGH (ref 70–99)
Glucose-Capillary: 140 mg/dL — ABNORMAL HIGH (ref 70–99)
Glucose-Capillary: 139 mg/dL — ABNORMAL HIGH (ref 70–99)
Glucose-Capillary: 97 mg/dL (ref 70–99)

## 2019-07-05 MED ORDER — POTASSIUM CHLORIDE CRYS ER 20 MEQ PO TBCR
40.0000 meq | EXTENDED_RELEASE_TABLET | ORAL | Status: AC
  Administered 2019-07-05 (×2): 40 meq via ORAL
  Filled 2019-07-05: qty 2

## 2019-07-05 NOTE — Progress Notes (Signed)
PROGRESS NOTE  Via Rosado  VHQ:[id number] DOB: 21-Apr-1975 DOA: 06/23/2019 PCP: Patient, No Pcp Per   Brief Narrative:  Benigna Delisi is a 44 y.o. female with a history of schizophrenia, bipolar disorder, obesity and T2DM who presented to the ED 4/12 with altered mental status found to be in DKA and with necrotizing soft tissue infection over lower abdominal wall and upper RLE extending into perineum. She was taken for extensive debridement by surgery on 4/13 and subsequently developed shock requiring pressors. Transient oliguria was noted, improved with fluid resuscitation. Taken back to ED for further debridement 4/15 and ultimately liberated from pressors and extubated 4/16. DKA has resolved and the patient has been transferred to hospitalist service on 4/18, to PCU 4/19.   Subjective:  Still reports some pain at surgical site, at baseline, overall controlled , she had loose bowel movment x2 yesterday.  Assessment & Plan: Active Problems:   DKA (diabetic ketoacidoses) (HCC)   Necrotizing cellulitis   Septic shock ([place])   AKI (acute kidney injury) ([place])   Acute respiratory failure ([place])  Septic shock due to necrotizing soft tissue infection:  - Complicated by uncontrolled hyperglycemia due to diabetes. Blood cultures negative and wound cultures w/no dominant organism. - Continue IV antibiotics including vancomycin, cefepime.  Cultures are inconclusive, appears to be improving per general surgery, currently antibiotic stopped 4/23. - Continue po and IV pain control, this have been managed by general surgery - Wound care per general surgery. s/p large debridement (45cc x 30cm) on 4/13 and repeat debridement of 3-5cm additional tissue from bilateral edges 4/15.No current plans for repeat debridement, plastics consulted.  Commend patient to go to surgery, for matrix placement, patient initially refusing, now she says she is considering it, and per my discussion with mom today she tells  me that daughter is agreeable, I will readdress it again with the mother as she states today she want to think about it and let us know by tomorrow. - continue with wound dressing per general surgery recommendation, has increased to 3 times daily, -, continue vitamin C, zinc. -Nutritionist consulted to address nutrition status.  Acute blood loss anemia:  - Following extensive surgery, s/p 1u PRBCs -Trend CBC closely and transfuse as needed.  Hemoglobin is 7.5 today, she is having small amount of blood loss through her wound, I would do anticipate she will need transfusion soon, so I will check type and screen tomorrow with morning labs, and likely will proceed with 1 unit PRBC if she remains on the same range..  Acute hypoxic respiratory failure: s/p ETT 4/13 - 4/16.  - Continue supplemental oxygen if needed to maintain SpO2 90%.   DKA, uncontrolled T2DM: DKA resolved. HbA1c 14.4%. -He is currently on sliding scale, CBGs are controlled,  on low-dose Levemir 5 units  - Will benefit from diabetes education once outside scope of such severe illness.  AKI: Resolved.   Poor IV access: And will require IV therapies.  - PICC left brachial 4/19  Hypokalemia:  -potassium 3.4 this morning, repleted.  Hypernatremia:  - Hydrate po, monitor.  Acute metabolic encephalopathy: Due to ICU delirium, shock, metabolic imbalances. - Delirium precautions, continue home psychotropic medications as below.  - Continue tube feeds.  Bipolar disorder, schizophrenia:  - Continue haldol, temazepam, divalproex and cogentin. Mentation improving.  - Note valproic acid level undetectable at admission.  Loose stools: In setting of tube feeds.  - DCed clindamycin - Abd benign. Will follow exam, fever curve, leukocytosis, and output.  -  Can DC flexiseal if/when stools no longer loose, continues to need this.  RN Pressure Injury Documentation: Pressure Injury 06/23/19 Buttocks Left (Active)  06/23/19 1945    Location: Buttocks  Location Orientation: Left  Staging:   Wound Description (Comments):   Present on Admission: Yes    Obesity: Estimated body mass index is 41.23 kg/m as calculated from the following:   Height as of this encounter: 5\' 7"  (1.702 m).   Weight as of this encounter: 119.4 kg.  DVT prophylaxis: Lovenox Code Status: Full Family Communication: None at bedside.  I did call mother by phone today, she was unavailable Disposition Plan:  Status is: Inpatient  Remains inpatient appropriate because: She remains on IV antibiotics, remains with massive wound requiring further debridement.   Dispo: The patient is from: Home              Anticipated d/c is to: CIR              Anticipated d/c date is: > 3 days              Patient currently is not medically stable to d/c.  Consultants:   PCCM  General surgery  Plastic surgery  Procedures:   Central line placement 4/14  ETT 4/13 - 4/16 06/24/19 IRRIGATION AND DEBRIDEMENT RIGHT GROIN AND RIGHT BUTTOCK 06/26/19, MD  06/24/19 IRRIGATION AND DEBRIDEMENT OF 06/26/19 Archie Patten, MD  06/26/19 IRRIGATION AND DEBRIDEMENT ABDOMINAL WALL AND GROIN       Antimicrobials: Metronidazole x1 (4/12) Cefepime (4/12) >> Vancomycin (4/12) >> Clindamycin (4/12) - 4/18    Objective: Vitals:   07/05/19 0800 07/05/19 1100 07/05/19 1200 07/05/19 1300  BP:   (!) 98/53   Pulse:   (!) 107   Resp:  (!) 29  (!) 24  Temp: 98.9 F (37.2 C)  98 F (36.7 C)   TempSrc: Oral  Oral   SpO2:   99%   Weight:      Height:        Intake/Output Summary (Last 24 hours) at 07/05/2019 1513 Last data filed at 07/05/2019 0823 Gross per 24 hour  Intake 10 ml  Output --  Net 10 ml   Filed Weights   07/03/19 0500 07/04/19 0302 07/05/19 0428  Weight: 124.5 kg 120.6 kg 119.4 kg     Awake Alert, Oriented X 3, No new F.N deficits, Normal affect Symmetrical Chest wall movement, Good air movement bilaterally, CTAB RRR,No Gallops,Rubs  or new Murmurs, No Parasternal Heave +ve B.Sounds, Abd Soft, No tenderness, No rebound - guarding or rigidity. No Cyanosis, Clubbing or edema.       Data Reviewed: I have personally reviewed following labs and imaging studies  CBC: Recent Labs  Lab 07/01/19 0517 07/02/19 0306 07/03/19 0405 07/04/19 0317 07/05/19 0348  WBC 19.5* 17.3* 14.8* 11.3* 8.6  HGB 8.1* 8.4* 8.0* 7.5* 7.5*  HCT 26.1* 27.0* 27.0* 25.1* 25.2*  MCV 87.9 89.1 90.3 89.0 90.0  PLT 229 273 351 385 472*   Basic Metabolic Panel: Recent Labs  Lab 06/29/19 0633 06/29/19 0633 06/30/19 0409 07/01/19 0517 07/03/19 0405 07/04/19 0317 07/05/19 0348  NA 148*   < > 146* 141 140 136 138  K 3.4*   < > 3.7 3.5 3.6 3.4* 3.4*  CL 115*   < > 111 106 106 104 105  CO2 26   < > 25 26 25 23 26   GLUCOSE 98   < > 156* 96 166* 123* 108*  BUN  20   < > 19 11 16 11 8   CREATININE 0.62   < > 0.56 0.48 0.51 0.56 0.50  CALCIUM 7.3*   < > 7.7* 7.7* 7.9* 7.6* 7.4*  MG 2.2  --  2.2  --   --   --   --   PHOS 2.5  --  2.5  --   --   --   --    < > = values in this interval not displayed.   GFR: Estimated Creatinine Clearance: 120 mL/min (by C-G formula based on SCr of 0.5 mg/dL). Liver Function Tests: Recent Labs  Lab 06/30/19 0409  AST 30  ALT 29  ALKPHOS 129*  BILITOT 0.3  PROT 6.2*  ALBUMIN 1.2*   No results for input(s): LIPASE, AMYLASE in the last 168 hours. No results for input(s): AMMONIA in the last 168 hours. Coagulation Profile: No results for input(s): INR, PROTIME in the last 168 hours. Cardiac Enzymes: No results for input(s): CKTOTAL, CKMB, CKMBINDEX, TROPONINI in the last 168 hours. BNP (last 3 results) No results for input(s): PROBNP in the last 8760 hours. HbA1C: No results for input(s): HGBA1C in the last 72 hours. CBG: Recent Labs  Lab 07/04/19 1156 07/04/19 1831 07/04/19 2146 07/05/19 0756 07/05/19 1240  GLUCAP 129* 112* 113* 139* 140*   Lipid Profile: No results for input(s): CHOL,  HDL, LDLCALC, TRIG, CHOLHDL, LDLDIRECT in the last 72 hours. Thyroid Function Tests: No results for input(s): TSH, T4TOTAL, FREET4, T3FREE, THYROIDAB in the last 72 hours. Anemia Panel: No results for input(s): VITAMINB12, FOLATE, FERRITIN, TIBC, IRON, RETICCTPCT in the last 72 hours. Urine analysis:    Component Value Date/Time   COLORURINE YELLOW 06/23/2019 1715   APPEARANCEUR CLOUDY (A) 06/23/2019 1715   LABSPEC 1.028 06/23/2019 1715   PHURINE 6.0 06/23/2019 1715   GLUCOSEU >=500 (A) 06/23/2019 1715   HGBUR LARGE (A) 06/23/2019 1715   BILIRUBINUR NEGATIVE 06/23/2019 1715   KETONESUR 20 (A) 06/23/2019 1715   PROTEINUR 30 (A) 06/23/2019 1715   NITRITE NEGATIVE 06/23/2019 1715   LEUKOCYTESUR LARGE (A) 06/23/2019 1715   No results found for this or any previous visit (from the past 240 hour(s)).    Radiology Studies: No results found.  Scheduled Meds: . acetaminophen  1,000 mg Oral Q6H  . vitamin C  500 mg Oral BID  . benztropine  1 mg Oral BID  . chlorhexidine  15 mL Mouth Rinse BID  . Chlorhexidine Gluconate Cloth  6 each Topical Daily  . enoxaparin (LOVENOX) injection  40 mg Subcutaneous Q24H  . feeding supplement  1 Container Oral TID BM  . feeding supplement (GLUCERNA SHAKE)  237 mL Oral TID BM  . feeding supplement (PRO-STAT SUGAR FREE 64)  30 mL Oral TID BM  . Gerhardt's butt cream   Topical BID  . haloperidol  10 mg Oral q morning - 10a  . haloperidol  20 mg Oral QHS  . insulin aspart  0-5 Units Subcutaneous QHS  . insulin aspart  0-9 Units Subcutaneous TID WC  . insulin detemir  5 Units Subcutaneous Daily  . mouth rinse  15 mL Mouth Rinse q12n4p  . multivitamin with minerals  1 tablet Oral Daily  . nutrition supplement (JUVEN)  1 packet Oral BID BM  . pantoprazole sodium  40 mg Oral Daily  . sodium chloride flush  10-40 mL Intracatheter Q12H  . valproic acid  250 mg Oral BID  . zinc sulfate  220 mg Oral Daily  Continuous Infusions: . sodium chloride Stopped  (07/03/19 2246)     LOS: 12 days    Huey Bienenstock, MD [facility] [email or web address] 07/05/2019, 3:13 PM

## 2019-07-06 DIAGNOSIS — E1169 Type 2 diabetes mellitus with other specified complication: Secondary | ICD-10-CM

## 2019-07-06 DIAGNOSIS — Z794 Long term (current) use of insulin: Secondary | ICD-10-CM

## 2019-07-06 LAB — BASIC METABOLIC PANEL
Anion gap: 6 (ref 5–15)
BUN: 8 mg/dL (ref 6–20)
CO2: 27 mmol/L (ref 22–32)
Calcium: 7.4 mg/dL — ABNORMAL LOW (ref 8.9–10.3)
Chloride: 105 mmol/L (ref 98–111)
Creatinine, Ser: 0.48 mg/dL (ref 0.44–1.00)
GFR calc Af Amer: >60 mL/min (ref >60)
GFR calc non Af Amer: >60 mL/min (ref >60)
Glucose, Bld: 113 mg/dL — ABNORMAL HIGH (ref 70–99)
Potassium: 3.7 mmol/L (ref 3.5–5.1)
Sodium: 138 mmol/L (ref 135–145)

## 2019-07-06 LAB — CBC
HCT: 24.5 % — ABNORMAL LOW (ref 36.0–46.0)
Hemoglobin: 7.3 g/dL — ABNORMAL LOW (ref 12.0–15.0)
MCH: 26.7 pg (ref 26.0–34.0)
MCHC: 29.8 g/dL — ABNORMAL LOW (ref 30.0–36.0)
MCV: 89.7 fL (ref 80.0–100.0)
Platelets: 494 10*3/uL — ABNORMAL HIGH (ref 150–400)
RBC: 2.73 MIL/uL — ABNORMAL LOW (ref 3.87–5.11)
RDW: 17.3 % — ABNORMAL HIGH (ref 11.5–15.5)
WBC: 8.3 10*3/uL (ref 4.0–10.5)
nRBC: 0 % (ref 0.0–0.2)

## 2019-07-06 LAB — PREPARE RBC (CROSSMATCH)

## 2019-07-06 LAB — GLUCOSE, CAPILLARY
Glucose-Capillary: 107 mg/dL — ABNORMAL HIGH (ref 70–99)
Glucose-Capillary: 102 mg/dL — ABNORMAL HIGH (ref 70–99)
Glucose-Capillary: 89 mg/dL (ref 70–99)

## 2019-07-06 MED ORDER — SODIUM CHLORIDE 0.9% IV SOLUTION: Freq: Once | INTRAVENOUS | Status: AC

## 2019-07-06 MED ORDER — CALCIUM CARBONATE ANTACID 500 MG PO CHEW
1.0000 | CHEWABLE_TABLET | Freq: Two times a day (BID) | ORAL | Status: DC
  Administered 2019-07-06 – 2019-07-07 (×3): 200 mg via ORAL
  Filled 2019-07-06 (×3): qty 1

## 2019-07-06 NOTE — Progress Notes (Addendum)
PROGRESS NOTE  Kimberly Orozco  OAC:[id number] DOB: 1975-05-01 DOA: 06/23/2019 PCP: Patient, No Pcp Per   Brief Narrative:  Kimberly Orozco is a 44 y.o. female with a history of schizophrenia, bipolar disorder, obesity and T2DM who presented to the ED 4/12 with altered mental status found to be in DKA and with necrotizing soft tissue infection over lower abdominal wall and upper RLE extending into perineum. She was taken for extensive debridement by surgery on 4/13 and subsequently developed shock requiring pressors. Transient oliguria was noted, improved with fluid resuscitation. Taken back to ED for further debridement 4/15 and ultimately liberated from pressors and extubated 4/16. DKA has resolved and the patient has been transferred to hospitalist service on 4/18, to PCU 4/19.   Subjective:  Still reports some pain at surgical site, at baseline, overall controlled , she denies any shortness of breath or chest pain.  Assessment & Plan: Active Problems:   DKA (diabetic ketoacidoses) (HCC)   Necrotizing cellulitis   Septic shock (HCC)   AKI (acute kidney injury) (HCC)   Acute respiratory failure (HCC)  Septic shock due to necrotizing soft tissue infection:  - Complicated by uncontrolled hyperglycemia due to diabetes. Blood cultures negative and wound cultures w/no dominant organism. - Continue IV antibiotics including vancomycin, cefepime.  Cultures are inconclusive, appears to be improving per general surgery, currently antibiotic stopped 4/23. - Continue po and IV pain control, this have been managed by general surgery - Wound care per general surgery. s/p large debridement (45cc x 30cm) on 4/13 and repeat debridement of 3-5cm additional tissue from bilateral edges 4/15.No current plans for repeat debridement, plastics consulted.  Commend patient to go to surgery, for matrix placement, patient initially refusing, after multiple discussion with the patient, as well discussed with her mom,  will discuss with the patient, patient is currently agreeable to the procedure, I have discussed with Dr. Ulice Bold, hopefully it can still be done tomorrow, patient will be kept n.p.o. after midnight . - continue with wound dressing per general surgery recommendation, has increased to 3 times daily, -continue vitamin C, zinc. -Nutritionist consulted to address nutrition status.  Acute blood loss anemia:  - Following extensive surgery, s/p 1u PRBCs -Trend CBC closely and transfuse as needed.  Globin is 7.3 this morning, likely will keep trending down giving small amount of oozing through her wound, will go ahead and transfuse 1 unit PRBC today, especially she will go for surgery tomorrow.  Acute hypoxic respiratory failure: s/p ETT 4/13 - 4/16.  - Continue supplemental oxygen if needed to maintain SpO2 90%.   DKA, uncontrolled T2DM: DKA resolved. HbA1c 14.4%. -He is currently on sliding scale, CBGs are controlled,  on low-dose Levemir 5 units  - Will benefit from diabetes education once outside scope of such severe illness.  AKI: Resolved.   Poor IV access: And will require IV therapies.  - PICC left brachial 4/19  Hypokalemia:  -potassium 3.4 this morning, repleted.  Hypernatremia:  - Hydrate po, monitor.  Acute metabolic encephalopathy: Due to ICU delirium, shock, metabolic imbalances. - Delirium precautions, continue home psychotropic medications as below.  - Continue tube feeds.  Bipolar disorder, schizophrenia:  - Continue haldol, temazepam, divalproex and cogentin. Mentation improving.  - Note valproic acid level undetectable at admission.  Loose stools: In setting of tube feeds.  - DCed clindamycin - Abd benign. Will follow exam, fever curve, leukocytosis, and output.  - Can DC flexiseal if/when stools no longer loose, continues to need this.  Hypocalcemia/hypoalbuminemia -We  will recheck albumin level tomorrow to see if this is true hypocalcemia once corrected level,  albumin was 1.2 on admission, empirically she was started on calcium carbonate.  RN Pressure Injury Documentation: Pressure Injury 06/23/19 Buttocks Left (Active)  06/23/19 1945  Location: Buttocks  Location Orientation: Left  Staging:   Wound Description (Comments):   Present on Admission: Yes    Obesity: Estimated body mass index is 40.88 kg/m as calculated from the following:   Height as of this encounter: 5\' 7"  (1.702 m).   Weight as of this encounter: 118.4 kg.  DVT prophylaxis: Lovenox Code Status: Full Family Communication: Cussed with mother at bedside Disposition Plan:  Status is: Inpatient  Remains inpatient appropriate because: She remains on IV antibiotics, remains with massive wound requiring further debridement.   Dispo: The patient is from: Home              Anticipated d/c is to: CIR              Anticipated d/c date is: > 3 days              Patient currently is not medically stable to d/c.  Consultants:   PCCM  General surgery  Plastic surgery  Procedures:   Central line placement 4/14  ETT 4/13 - 4/16 06/24/19 IRRIGATION AND DEBRIDEMENT RIGHT GROIN AND RIGHT BUTTOCK 06/26/19, MD  06/24/19 IRRIGATION AND DEBRIDEMENT OF 06/26/19 Archie Patten, MD  06/26/19 IRRIGATION AND DEBRIDEMENT ABDOMINAL WALL AND GROIN       Antimicrobials: Metronidazole x1 (4/12) Cefepime (4/12) >> Vancomycin (4/12) >> Clindamycin (4/12) - 4/18    Objective: Vitals:   07/06/19 0636 07/06/19 1039 07/06/19 1120 07/06/19 1300  BP:  110/70 116/75 126/75  Pulse: (!) 105 (!) 113 (!) 106 (!) 109  Resp: 17 (!) 24 (!) 30 (!) 22  Temp:  98.8 F (37.1 C) 98.5 F (36.9 C) 98.3 F (36.8 C)  TempSrc:  Oral Oral Oral  SpO2: 91% 98% 99% 92%  Weight:      Height:        Intake/Output Summary (Last 24 hours) at 07/06/2019 1505 Last data filed at 07/06/2019 1300 Gross per 24 hour  Intake 315 ml  Output 350 ml  Net -35 ml   Filed Weights   07/04/19 0302 07/05/19  0428 07/06/19 0450  Weight: 120.6 kg 119.4 kg 118.4 kg     Awake Alert, Oriented X 3, No new F.N deficits, Normal affect Symmetrical Chest wall movement, Good air movement bilaterally, CTAB RRR,No Gallops,Rubs or new Murmurs, No Parasternal Heave +ve B.Sounds, Abd Soft, No tenderness, No rebound - guarding or rigidity. No Cyanosis, Clubbing or edema.      Data Reviewed: I have personally reviewed following labs and imaging studies  CBC: Recent Labs  Lab 07/02/19 0306 07/03/19 0405 07/04/19 0317 07/05/19 0348 07/06/19 0344  WBC 17.3* 14.8* 11.3* 8.6 8.3  HGB 8.4* 8.0* 7.5* 7.5* 7.3*  HCT 27.0* 27.0* 25.1* 25.2* 24.5*  MCV 89.1 90.3 89.0 90.0 89.7  PLT 273 351 385 472* 494*   Basic Metabolic Panel: Recent Labs  Lab 06/30/19 0409 06/30/19 0409 07/01/19 0517 07/03/19 0405 07/04/19 0317 07/05/19 0348 07/06/19 0344  NA 146*   < > 141 140 136 138 138  K 3.7   < > 3.5 3.6 3.4* 3.4* 3.7  CL 111   < > 106 106 104 105 105  CO2 25   < > 26 25 23 26 27   GLUCOSE 156*   < >  96 166* 123* 108* 113*  BUN 19   < > 11 16 11 8 8   CREATININE 0.56   < > 0.48 0.51 0.56 0.50 0.48  CALCIUM 7.7*   < > 7.7* 7.9* 7.6* 7.4* 7.4*  MG 2.2  --   --   --   --   --   --   PHOS 2.5  --   --   --   --   --   --    < > = values in this interval not displayed.   GFR: Estimated Creatinine Clearance: 119.4 mL/min (by C-G formula based on SCr of 0.48 mg/dL). Liver Function Tests: Recent Labs  Lab 06/30/19 0409  AST 30  ALT 29  ALKPHOS 129*  BILITOT 0.3  PROT 6.2*  ALBUMIN 1.2*   No results for input(s): LIPASE, AMYLASE in the last 168 hours. No results for input(s): AMMONIA in the last 168 hours. Coagulation Profile: No results for input(s): INR, PROTIME in the last 168 hours. Cardiac Enzymes: No results for input(s): CKTOTAL, CKMB, CKMBINDEX, TROPONINI in the last 168 hours. BNP (last 3 results) No results for input(s): PROBNP in the last 8760 hours. HbA1C: No results for input(s):  HGBA1C in the last 72 hours. CBG: Recent Labs  Lab 07/05/19 1240 07/05/19 1804 07/05/19 2117 07/06/19 0822 07/06/19 1239  GLUCAP 140* 139* 97 107* 102*   Lipid Profile: No results for input(s): CHOL, HDL, LDLCALC, TRIG, CHOLHDL, LDLDIRECT in the last 72 hours. Thyroid Function Tests: No results for input(s): TSH, T4TOTAL, FREET4, T3FREE, THYROIDAB in the last 72 hours. Anemia Panel: No results for input(s): VITAMINB12, FOLATE, FERRITIN, TIBC, IRON, RETICCTPCT in the last 72 hours. Urine analysis:    Component Value Date/Time   COLORURINE YELLOW 06/23/2019 1715   APPEARANCEUR CLOUDY (A) 06/23/2019 1715   LABSPEC 1.028 06/23/2019 1715   PHURINE 6.0 06/23/2019 1715   GLUCOSEU >=500 (A) 06/23/2019 1715   HGBUR LARGE (A) 06/23/2019 1715   BILIRUBINUR NEGATIVE 06/23/2019 1715   KETONESUR 20 (A) 06/23/2019 1715   PROTEINUR 30 (A) 06/23/2019 1715   NITRITE NEGATIVE 06/23/2019 1715   LEUKOCYTESUR LARGE (A) 06/23/2019 1715   No results found for this or any previous visit (from the past 240 hour(s)).    Radiology Studies: No results found.  Scheduled Meds: . acetaminophen  1,000 mg Oral Q6H  . vitamin C  500 mg Oral BID  . benztropine  1 mg Oral BID  . calcium carbonate  1 tablet Oral BID WC  . chlorhexidine  15 mL Mouth Rinse BID  . Chlorhexidine Gluconate Cloth  6 each Topical Daily  . enoxaparin (LOVENOX) injection  40 mg Subcutaneous Q24H  . feeding supplement  1 Container Oral TID BM  . feeding supplement (GLUCERNA SHAKE)  237 mL Oral TID BM  . feeding supplement (PRO-STAT SUGAR FREE 64)  30 mL Oral TID BM  . Gerhardt's butt cream   Topical BID  . haloperidol  10 mg Oral q morning - 10a  . haloperidol  20 mg Oral QHS  . insulin aspart  0-5 Units Subcutaneous QHS  . insulin aspart  0-9 Units Subcutaneous TID WC  . insulin detemir  5 Units Subcutaneous Daily  . mouth rinse  15 mL Mouth Rinse q12n4p  . multivitamin with minerals  1 tablet Oral Daily  . nutrition  supplement (JUVEN)  1 packet Oral BID BM  . pantoprazole sodium  40 mg Oral Daily  . sodium chloride flush  10-40  mL Intracatheter Q12H  . valproic acid  250 mg Oral BID  . zinc sulfate  220 mg Oral Daily   Continuous Infusions: . sodium chloride Stopped (07/03/19 2246)     LOS: 13 days    Phillips Climes, MD [facility] [email or web address] 07/06/2019, 3:05 PM

## 2019-07-06 NOTE — Progress Notes (Signed)
Patient currently refusing blood transfusion.  Will wait until she speaks to Dr. Randol Kern.

## 2019-07-07 ENCOUNTER — Encounter (HOSPITAL_COMMUNITY)
Admission: EM | Disposition: A | Discharge status: Skilled Nursing Facility | Payer: Self-pay | Source: Home / Self Care | Attending: Internal Medicine

## 2019-07-07 DIAGNOSIS — D649 Anemia, unspecified: Secondary | ICD-10-CM

## 2019-07-07 LAB — CBC
HCT: 28.1 % — ABNORMAL LOW (ref 36.0–46.0)
Hemoglobin: 8.6 g/dL — ABNORMAL LOW (ref 12.0–15.0)
MCH: 27.3 pg (ref 26.0–34.0)
MCHC: 30.6 g/dL (ref 30.0–36.0)
MCV: 89.2 fL (ref 80.0–100.0)
Platelets: 548 10*3/uL — ABNORMAL HIGH (ref 150–400)
RBC: 3.15 MIL/uL — ABNORMAL LOW (ref 3.87–5.11)
RDW: 17.1 % — ABNORMAL HIGH (ref 11.5–15.5)
WBC: 7.5 10*3/uL (ref 4.0–10.5)
nRBC: 0 % (ref 0.0–0.2)

## 2019-07-07 LAB — BASIC METABOLIC PANEL
Anion gap: 8 (ref 5–15)
BUN: <5 mg/dL — ABNORMAL LOW (ref 6–20)
CO2: 28 mmol/L (ref 22–32)
Calcium: 7.5 mg/dL — ABNORMAL LOW (ref 8.9–10.3)
Chloride: 104 mmol/L (ref 98–111)
Creatinine, Ser: 0.5 mg/dL (ref 0.44–1.00)
GFR calc Af Amer: >60 mL/min (ref >60)
GFR calc non Af Amer: >60 mL/min (ref >60)
Glucose, Bld: 99 mg/dL (ref 70–99)
Potassium: 3.7 mmol/L (ref 3.5–5.1)
Sodium: 140 mmol/L (ref 135–145)

## 2019-07-07 LAB — GLUCOSE, CAPILLARY
Glucose-Capillary: 104 mg/dL — ABNORMAL HIGH (ref 70–99)
Glucose-Capillary: 127 mg/dL — ABNORMAL HIGH (ref 70–99)
Glucose-Capillary: 136 mg/dL — ABNORMAL HIGH (ref 70–99)
Glucose-Capillary: 125 mg/dL — ABNORMAL HIGH (ref 70–99)

## 2019-07-07 LAB — ALBUMIN: Albumin: <1 g/dL — ABNORMAL LOW (ref 3.5–5.0)

## 2019-07-07 SURGERY — IRRIGATION AND DEBRIDEMENT WOUND
Anesthesia: General | Site: Abdomen

## 2019-07-07 MED ORDER — PRO-STAT SUGAR FREE PO LIQD
60.0000 mL | Freq: Two times a day (BID) | ORAL | Status: DC
  Administered 2019-07-07 – 2019-07-08 (×2): 60 mL via ORAL
  Filled 2019-07-07 (×4): qty 60

## 2019-07-07 NOTE — Plan of Care (Signed)
  Problem: Clinical Measurements: Goal: Ability to maintain clinical measurements within normal limits will improve Outcome: Progressing   

## 2019-07-07 NOTE — Progress Notes (Signed)
Nutrition Follow-up  DOCUMENTATION CODES:   Obesity unspecified  INTERVENTION:  Provide Boost Breeze po TID, each supplement provides 250 kcal and 9 grams of protein.  Provide 60 ml Prostat po BID, each supplement provides 100 kcal and 15 grams of protein.   Provide Glucerna Shake po TID, each supplement provides 220 kcal and 10 grams of protein.  Continue Juven BID, each packet provides 95 calories, 2.5 grams of protein.  Continue Magic cup TID with meals, each supplement provides 290 kcal and 9 grams of protein  Encourage adequate PO intake.   NUTRITION DIAGNOSIS:   Increased nutrient needs related to wound healing, acute illness as evidenced by estimated needs; ongoing  GOAL:   Patient will meet greater than or equal to 90% of their needs; progressing  MONITOR:   PO intake, Supplement acceptance, Labs, Weight trends, Skin, I & O's  REASON FOR ASSESSMENT:   Consult, Ventilator Enteral/tube feeding initiation and management  ASSESSMENT:   44 yo female admitted with DKA, necrotizing soft tissue infection of lower abdominal wall and upper thigh extending to perineum, septic shock. PMH includes DM, bipolar disorder, schizophrenia   RD working remotely.  RD consulted to provide diet education regarding increased protein needs for wound healing. Pt unavailable during attempted time of contact. Plans for surgery tomorrow. Per plastic surgery MD, plan for debridement and potentially a partial closure with coverage with pry matrix mass. Pt currently has Prostat, Boost Breeze, Glucerna shake, and Juven ordered and has been consuming most of them. RD to increase prostat orders to aid in increased protein needs for wound healing.   Labs and medications reviewed.   Diet Order:   Diet Order            Diet regular Room service appropriate? Yes with Assist; Fluid consistency: Thin  Diet effective now              EDUCATION NEEDS:   Not appropriate for education at this  time  Skin:  Skin Assessment: Skin Integrity Issues: Skin Integrity Issues:: Unstageable, Other (Comment) Unstageable: N/A Other: Necrotizing wound to lower abdominal wall and upper thigh extending to perineum  Last BM:  4/25  Height:   Ht Readings from Last 1 Encounters:  06/23/19 5\' 7"  (1.702 m)    Weight:   Wt Readings from Last 1 Encounters:  07/07/19 114.2 kg    BMI:  Body mass index is 39.43 kg/m.  Estimated Nutritional Needs:   Kcal:  2400-2700 kcals  Protein:  120-150 g  Fluid:  >/= 2 L    07/09/19, MS, RD, LDN RD pager number/after hours weekend pager number on Amion.

## 2019-07-07 NOTE — Progress Notes (Signed)
Saw patient in hospital room.  She complains of pain at the surgical site but feels like she is improving.  On exam the wound looks to be most mostly healthy at the base with red granulation tissue.  Unable to palpate her femoral vessels but they are not exposed.  There is still some scattered fibrinous debris around the periphery and base of the wound in a few places.  There is quite a bit of abdominal skin laxity.  I do not see much in the way of purulence.  I long discussion with the patient about the complexity of her situation.  Looks like she has had reasonably good debridement but will take quite a bit of time to get this wound healed.  My plan would be to do an additional debridement and potentially a partial closure with coverage with pry matrix mass at the same time.  I then give this about 2 to 3 weeks to incorporate at that point she should be ready for skin graft.  I explained that she would still need regular dressing changes over that timeframe.  She seemed understand and agreed with the plan.  I posted her from the OR for tomorrow.  Please keep her n.p.o. at midnight tonight.

## 2019-07-07 NOTE — Progress Notes (Signed)
Physical Therapy Treatment Patient Details Name: Kimberly Orozco MRN: [id number] DOB: 11/12/1975 Today's Date: 07/07/2019    History of Present Illness 44 year old female admitted 06/23/19 with altered mental status and found to be in DKA with necrotizing soft tissue infection lower abdominal wall and upper RLE extending into perineum. Patient had extensive debridement by surgery 06/24/19 and developed shock requiring pressors. Repeat debridement 06/26/19, no longer requiring pressors and extubated 06/27/19. BKA resolved and patient transferred to hospitalist service 4/18, PCU 4/19. Plastic surgery consulted. Patient declining surgery at this time. PMH: schizophrenia, bipolar disorder, obesity and T2DM    PT Comments    Patient progressed to short distance ambulation (60ft) in room with RW and chair follow/2nd person for line mgmt and safety. Patient limited by pain. She declined pain medication at first but then requesting pain medication at end of session. Education on benefits of mobility to tolerance including OOB in chair but patient requesting back to bed due to pain and discomfort. Continued recommendation for skilled PT services and discharge to SNF or CIR (pending mobility tolerance) for continued skilled therapy services.   Follow Up Recommendations  SNF;CIR     Equipment Recommendations  Rolling walker with 5" wheels;3in1 (PT);Hospital bed;Other (comment)(TBD at next level of care)    Recommendations for Other Services Rehab consult     Precautions / Restrictions Precautions Precautions: Fall;Other (comment) Precaution Comments: L PICC, HOB>30 degrees catheter, abdominal wound Restrictions Weight Bearing Restrictions: No    Mobility  Bed Mobility Overal bed mobility: Needs Assistance Bed Mobility: Sit to Supine       Sit to supine: Mod assist;+2 for safety/equipment   General bed mobility comments: Encouraged use of bedrail  Transfers Overall transfer level: Needs  assistance Equipment used: Rolling walker (2 wheeled) Transfers: Sit to/from Stand Sit to Stand: Min assist;+2 physical assistance;+2 safety/equipment         General transfer comment: sit>stand from chair x 2 trials, stand>sit on chair, stand>sit on EOB  Ambulation/Gait Ambulation/Gait assistance: Min assist;Min guard Gait Distance (Feet): 5 Feet(x 2) Assistive device: Rolling walker (2 wheeled) Gait Pattern/deviations: Decreased step length - right;Decreased step length - left;Step-to pattern Gait velocity: decreased   General Gait Details: Gait training with RW and cues and assist for safe use of RW especially on turns and in small spaces. Patient ambulated 3ft with chair follow trial 1 and ambulated 59ft from chair to bed trial 2, Patient limited by pain. She reports receiving morphine earlier. Declined asking nurse for pain medication at start of session, now requesting pain medication. RN notified and provided at end of session.    Balance Overall balance assessment: Needs assistance Sitting-balance support: Feet supported Sitting balance-Leahy Scale: Fair     Standing balance support: Bilateral upper extremity supported Standing balance-Leahy Scale: Poor Standing balance comment: Standing tolerance limited by pain.    Cognition Arousal/Alertness: Awake/alert;Lethargic Behavior During Therapy: WFL for tasks assessed/performed Overall Cognitive Status: Within Functional Limits for tasks assessed        General Comments General comments (skin integrity, edema, etc.): Lateral scooting at EOB toward [place] with minA and cues.      Pertinent Vitals/Pain Pain Assessment: 0-10 Pain Score: 10-Worst pain ever Pain Location: vagina, abdomen Pain Intervention(s): Limited activity within patient's tolerance;Monitored during session;Repositioned;Patient requesting pain meds-RN notified;Premedicated before session           PT Goals (current goals can now be found in the care  plan section) Progress towards PT goals: Progressing toward goals  Frequency    Min 3X/week      PT Plan Current plan remains appropriate       AM-PAC PT "6 Clicks" Mobility   Outcome Measure  Help needed turning from your back to your side while in a flat bed without using bedrails?: A Lot Help needed moving from lying on your back to sitting on the side of a flat bed without using bedrails?: A Lot Help needed moving to and from a bed to a chair (including a wheelchair)?: A Lot Help needed standing up from a chair using your arms (e.g., wheelchair or bedside chair)?: A Little Help needed to walk in hospital room?: A Lot Help needed climbing 3-5 steps with a railing? : Total 6 Click Score: 12    End of Session   Activity Tolerance: Patient limited by pain Patient left: in bed;with call bell/phone within reach;with bed alarm set Nurse Communication: Mobility status;Patient requests pain meds PT Visit Diagnosis: Other abnormalities of gait and mobility (R26.89);Muscle weakness (generalized) (M62.81);Pain     Time: 5051-8335 PT Time Calculation (min) (ACUTE ONLY): 19 min  Charges:  $Therapeutic Activity: 8-22 mins                    Angelene Giovanni, PT, DPT Acute Rehab [phone] office     Angelene Giovanni 07/07/2019, 2:13 PM

## 2019-07-07 NOTE — Progress Notes (Addendum)
PROGRESS NOTE  Kimberly Orozco  IYM:[id number] DOB: December 29, 1975 DOA: 06/23/2019 PCP: Patient, No Pcp Per   Brief Narrative:  Kimberly Orozco is a 44 y.o. female with a history of schizophrenia, bipolar disorder, obesity and T2DM who presented to the ED 4/12 with altered mental status found to be in DKA and with necrotizing soft tissue infection over lower abdominal wall and upper RLE extending into perineum. She was taken for extensive debridement by surgery on 4/13 and subsequently developed shock requiring pressors. Transient oliguria was noted, improved with fluid resuscitation. Taken back to ED for further debridement 4/15 and ultimately liberated from pressors and extubated 4/16. DKA has resolved and the patient has been transferred to hospitalist service on 4/18, to PCU 4/19.   Subjective:  Still reports some pain at surgical site, at baseline, overall controlled , denies any nausea, chest pain or shortness of breath.  Assessment & Plan: Active Problems:   DKA (diabetic ketoacidoses) (HCC)   Necrotizing cellulitis   Septic shock (HCC)   AKI (acute kidney injury) (HCC)   Acute respiratory failure (HCC)  Septic shock due to necrotizing soft tissue infection:  - Complicated by uncontrolled hyperglycemia due to diabetes. Blood cultures negative and wound cultures w/no dominant organism. - Continue IV antibiotics including vancomycin, cefepime.  Cultures are inconclusive, appears to be improving per general surgery, currently antibiotic stopped 4/23. - Continue po and IV pain control, this have been managed by general surgery - Wound care per general surgery. s/p large debridement (45cc x 30cm) on 4/13 and repeat debridement of 3-5cm additional tissue from bilateral edges 4/15.No current plans for repeat debridement by general surgery . -Plastic surgery input greatly appreciated, plan for surgery tomorrow for some type of wound closure, and matrix application, will keep patient n.p.o. after  midnight . -I have discussed with patient at length about her nutritional status, and very low albumin <1, and the importance to comply of her boost and Glucerna . - continue with wound dressing per general surgery recommendation, has increased to 3 times daily, -continue vitamin C, zinc. -Nutritionist consulted to address nutrition status.  Acute blood loss anemia:  - Following extensive surgery, s/p 2 u PRBCs during hospital stay so far, most recent hemoglobin is 8.6, will continue to monitor closely and transfuse as needed, as she expected to need some further transfusion during hospital stay, is she having some expected small amount of oozing from her wound.  Acute hypoxic respiratory failure: s/p ETT 4/13 - 4/16.  - Continue supplemental oxygen if needed to maintain SpO2 90%.   DKA, uncontrolled T2DM: DKA resolved. HbA1c 14.4%. -He is currently on sliding scale, CBGs are controlled,  on low-dose Levemir 5 units  - Will benefit from diabetes education once outside scope of such severe illness.  AKI: Resolved.   Poor IV access: And will require IV therapies.  - PICC left brachial 4/19  Hypokalemia:  -potassium 3.4 this morning, repleted.  Hypernatremia:  - Hydrate po, monitor.   Acute metabolic encephalopathy: Due to ICU delirium, shock, metabolic imbalances. - Delirium precautions, continue home psychotropic medications as below.  - Continue tube feeds.  Bipolar disorder, schizophrenia:  - Continue haldol, temazepam, divalproex and cogentin. Mentation improving.  - Note valproic acid level undetectable at admission.  Loose stools: In setting of tube feeds.  - DCed clindamycin - Abd benign. Will follow exam, fever curve, leukocytosis, and output.  - Can DC flexiseal if/when stools no longer loose, continues to need this.  Hypocalcemia/hypoalbuminemia -We will recheck  albumin level tomorrow to see if this is true hypocalcemia once corrected level, albumin was 1.2 on  admission, empirically she was started on calcium carbonate.  RN Pressure Injury Documentation: Pressure Injury 06/23/19 Buttocks Left (Active)  06/23/19 1945  Location: Buttocks  Location Orientation: Left  Staging:   Wound Description (Comments):   Present on Admission: Yes    Obesity: Estimated body mass index is 39.43 kg/m as calculated from the following:   Height as of this encounter: 5\' 7"  (1.702 m).   Weight as of this encounter: 114.2 kg.  DVT prophylaxis: Lovenox Code Status: Full Family Communication: Discussed with mother at bedside 4/25 Disposition Plan:  Status is: Inpatient  Remains inpatient appropriate because: She remains on IV antibiotics, remains with massive wound requiring further debridement.   Dispo: The patient is from: Home              Anticipated d/c is to: CIR              Anticipated d/c date is: > 3 days              Patient currently is not medically stable to d/c.  Consultants:   PCCM  General surgery  Plastic surgery  Procedures:   Central line placement 4/14  ETT 4/13 - 4/16 06/24/19 IRRIGATION AND DEBRIDEMENT RIGHT GROIN AND RIGHT BUTTOCK Ralene Ok, MD  06/24/19 IRRIGATION AND DEBRIDEMENT OF Cipriano Bunker, MD  06/26/19 IRRIGATION AND DEBRIDEMENT ABDOMINAL WALL AND GROIN       Antimicrobials: Metronidazole x1 (4/12) Cefepime (4/12) >> Vancomycin (4/12) >> Clindamycin (4/12) - 4/18    Objective: Vitals:   07/07/19 0739 07/07/19 0828 07/07/19 1220 07/07/19 1234  BP: 109/69 105/64 91/61 (!) 108/53  Pulse: (!) 108 (!) 106 (!) 116 (!) 105  Resp: (!) 31 18 19 14   Temp: 97.6 F (36.4 C) 98.1 F (36.7 C) 98.5 F (36.9 C) 99 F (37.2 C)  TempSrc: Axillary Oral Oral Oral  SpO2: 95% 96% 96% 96%  Weight:      Height:        Intake/Output Summary (Last 24 hours) at 07/07/2019 1620 Last data filed at 07/07/2019 1041 Gross per 24 hour  Intake --  Output 1400 ml  Net -1400 ml   Filed Weights   07/05/19  0428 07/06/19 0450 07/07/19 0500  Weight: 119.4 kg 118.4 kg 114.2 kg     Awake Alert, Oriented X 3, No new F.N deficits, Normal affect Symmetrical Chest wall movement, Good air movement bilaterally, CTAB RRR,No Gallops,Rubs or new Murmurs, No Parasternal Heave +ve B.Sounds, Abd Soft, No tenderness, No rebound - guarding or rigidity. No Cyanosis, Clubbing or edema,        Data Reviewed: I have personally reviewed following labs and imaging studies  CBC: Recent Labs  Lab 07/03/19 0405 07/04/19 0317 07/05/19 0348 07/06/19 0344 07/07/19 0406  WBC 14.8* 11.3* 8.6 8.3 7.5  HGB 8.0* 7.5* 7.5* 7.3* 8.6*  HCT 27.0* 25.1* 25.2* 24.5* 28.1*  MCV 90.3 89.0 90.0 89.7 89.2  PLT 351 385 472* 494* 409*   Basic Metabolic Panel: Recent Labs  Lab 07/03/19 0405 07/04/19 0317 07/05/19 0348 07/06/19 0344 07/07/19 0406  NA 140 136 138 138 140  K 3.6 3.4* 3.4* 3.7 3.7  CL 106 104 105 105 104  CO2 25 23 26 27 28   GLUCOSE 166* 123* 108* 113* 99  BUN 16 11 8 8  <5*  CREATININE 0.51 0.56 0.50 0.48 0.50  CALCIUM 7.9* 7.6*  7.4* 7.4* 7.5*   GFR: Estimated Creatinine Clearance: 117 mL/min (by C-G formula based on SCr of 0.5 mg/dL). Liver Function Tests: Recent Labs  Lab 07/07/19 0406  ALBUMIN <1.0*   No results for input(s): LIPASE, AMYLASE in the last 168 hours. No results for input(s): AMMONIA in the last 168 hours. Coagulation Profile: No results for input(s): INR, PROTIME in the last 168 hours. Cardiac Enzymes: No results for input(s): CKTOTAL, CKMB, CKMBINDEX, TROPONINI in the last 168 hours. BNP (last 3 results) No results for input(s): PROBNP in the last 8760 hours. HbA1C: No results for input(s): HGBA1C in the last 72 hours. CBG: Recent Labs  Lab 07/06/19 0822 07/06/19 1239 07/06/19 2132 07/07/19 0737 07/07/19 1233  GLUCAP 107* 102* 89 104* 127*   Lipid Profile: No results for input(s): CHOL, HDL, LDLCALC, TRIG, CHOLHDL, LDLDIRECT in the last 72 hours. Thyroid  Function Tests: No results for input(s): TSH, T4TOTAL, FREET4, T3FREE, THYROIDAB in the last 72 hours. Anemia Panel: No results for input(s): VITAMINB12, FOLATE, FERRITIN, TIBC, IRON, RETICCTPCT in the last 72 hours. Urine analysis:    Component Value Date/Time   COLORURINE YELLOW 06/23/2019 1715   APPEARANCEUR CLOUDY (A) 06/23/2019 1715   LABSPEC 1.028 06/23/2019 1715   PHURINE 6.0 06/23/2019 1715   GLUCOSEU >=500 (A) 06/23/2019 1715   HGBUR LARGE (A) 06/23/2019 1715   BILIRUBINUR NEGATIVE 06/23/2019 1715   KETONESUR 20 (A) 06/23/2019 1715   PROTEINUR 30 (A) 06/23/2019 1715   NITRITE NEGATIVE 06/23/2019 1715   LEUKOCYTESUR LARGE (A) 06/23/2019 1715   No results found for this or any previous visit (from the past 240 hour(s)).    Radiology Studies: No results found.  Scheduled Meds: . acetaminophen  1,000 mg Oral Q6H  . vitamin C  500 mg Oral BID  . benztropine  1 mg Oral BID  . calcium carbonate  1 tablet Oral BID WC  . chlorhexidine  15 mL Mouth Rinse BID  . Chlorhexidine Gluconate Cloth  6 each Topical Daily  . enoxaparin (LOVENOX) injection  40 mg Subcutaneous Q24H  . feeding supplement  1 Container Oral TID BM  . feeding supplement (GLUCERNA SHAKE)  237 mL Oral TID BM  . feeding supplement (PRO-STAT SUGAR FREE 64)  30 mL Oral TID BM  . Gerhardt's butt cream   Topical BID  . haloperidol  10 mg Oral q morning - 10a  . haloperidol  20 mg Oral QHS  . insulin aspart  0-5 Units Subcutaneous QHS  . insulin aspart  0-9 Units Subcutaneous TID WC  . insulin detemir  5 Units Subcutaneous Daily  . mouth rinse  15 mL Mouth Rinse q12n4p  . multivitamin with minerals  1 tablet Oral Daily  . nutrition supplement (JUVEN)  1 packet Oral BID BM  . pantoprazole sodium  40 mg Oral Daily  . sodium chloride flush  10-40 mL Intracatheter Q12H  . valproic acid  250 mg Oral BID  . zinc sulfate  220 mg Oral Daily   Continuous Infusions: . sodium chloride Stopped (07/03/19 2246)      LOS: 14 days    Huey Bienenstock, MD [facility] [email or web address] 07/07/2019, 4:20 PM

## 2019-07-07 NOTE — Progress Notes (Signed)
11 Days Post-Op  Subjective: CC: Doing well. No current complaints. Some pain with dressing changes still. Agreeable to surgery with plastics.   Objective: Vital signs in last 24 hours: Temp:  [97.6 F (36.4 C)-99.1 F (37.3 C)] 98.1 F (36.7 C) (04/26 0828) Pulse Rate:  [101-113] 106 (04/26 0828) Resp:  [18-31] 18 (04/26 0828) BP: (94-126)/(64-78) 105/64 (04/26 0828) SpO2:  [92 %-100 %] 96 % (04/26 0828) Weight:  [114.2 kg] 114.2 kg (04/26 0500) Last BM Date: 07/06/19  Intake/Output from previous day: 04/25 0701 - 04/26 0700 In: 315 [Blood:315] Out: 1300 [Urine:1300] Intake/Output this shift: No intake/output data recorded.  PE: Gen: Alert, NAD, pleasant Abd: Soft, NTother than over areas of wound  GU: Catheter in place   Wound: Please see pictures below. Wound appears healthy. There is ~75-90% beefy red granulation tissue with some fibrinous tissue. Wound extends from the right lateral abdomen to the right thigh/groin and to the left abdomen. Wound edges are clean. No drainage or purulence on dressings. Wound measures 45x30x7cm.      Lab Results:  Recent Labs    07/06/19 0344 07/07/19 0406  WBC 8.3 7.5  HGB 7.3* 8.6*  HCT 24.5* 28.1*  PLT 494* 548*   BMET Recent Labs    07/06/19 0344 07/07/19 0406  NA 138 140  K 3.7 3.7  CL 105 104  CO2 27 28  GLUCOSE 113* 99  BUN 8 <5*  CREATININE 0.48 0.50  CALCIUM 7.4* 7.5*   PT/INR No results for input(s): LABPROT, INR in the last 72 hours. CMP     Component Value Date/Time   NA 140 07/07/2019 0406   K 3.7 07/07/2019 0406   CL 104 07/07/2019 0406   CO2 28 07/07/2019 0406   GLUCOSE 99 07/07/2019 0406   BUN <5 (L) 07/07/2019 0406   CREATININE 0.50 07/07/2019 0406   CALCIUM 7.5 (L) 07/07/2019 0406   PROT 6.2 (L) 06/30/2019 0409   ALBUMIN <1.0 (L) 07/07/2019 0406   AST 30 06/30/2019 0409   ALT 29 06/30/2019 0409   ALKPHOS 129 (H) 06/30/2019 0409   BILITOT 0.3 06/30/2019 0409   GFRNONAA >60  07/07/2019 0406   GFRAA >60 07/07/2019 0406   Lipase  No results found for: LIPASE     Studies/Results: No results found.  Anti-infectives: Anti-infectives (From admission, onward)   Start     Dose/Rate Route Frequency Ordered Stop   06/26/19 1200  Vancomycin (VANCOCIN) 1,500 mg in sodium chloride 0.9 % 500 mL IVPB  Status:  Discontinued     1,500 mg 250 mL/hr over 120 Minutes Intravenous Every 24 hours 06/25/19 1153 06/26/19 0455   06/26/19 1200  vancomycin (VANCOREADY) IVPB 1500 mg/300 mL  Status:  Discontinued     1,500 mg 150 mL/hr over 120 Minutes Intravenous Every 24 hours 06/26/19 0455 06/26/19 0730   06/26/19 0830  vancomycin (VANCOREADY) IVPB 1500 mg/300 mL  Status:  Discontinued     1,000 mg 100 mL/hr over 120 Minutes Intravenous Every 12 hours 06/26/19 0730 06/26/19 0741   06/26/19 0830  vancomycin (VANCOCIN) IVPB 1000 mg/200 mL premix  Status:  Discontinued     1,000 mg 200 mL/hr over 60 Minutes Intravenous Every 12 hours 06/26/19 0742 07/04/19 1238   06/25/19 1145  vancomycin (VANCOCIN) 500 mg in sodium chloride 0.9 % 100 mL IVPB     500 mg 100 mL/hr over 60 Minutes Intravenous NOW 06/25/19 1055 06/25/19 1241   06/24/19 1600  vancomycin (VANCOREADY) IVPB  750 mg/150 mL  Status:  Discontinued     750 mg 150 mL/hr over 60 Minutes Intravenous Every 12 hours 06/23/19 1700 06/24/19 0714   06/24/19 1600  vancomycin (VANCOCIN) IVPB 1000 mg/200 mL premix  Status:  Discontinued     1,000 mg 200 mL/hr over 60 Minutes Intravenous Every 12 hours 06/24/19 0714 06/24/19 0750   06/24/19 1400  clindamycin (CLEOCIN) IVPB 900 mg  Status:  Discontinued     900 mg 100 mL/hr over 30 Minutes Intravenous Every 8 hours 06/24/19 0756 06/29/19 1137   06/24/19 1145  ceFEPIme (MAXIPIME) 2 g in sodium chloride 0.9 % 100 mL IVPB  Status:  Discontinued     2 g 200 mL/hr over 30 Minutes Intravenous To ShortStay Surgical 06/24/19 1131 06/24/19 1457   06/24/19 1030  ceFEPIme (MAXIPIME) 2 g in  sodium chloride 0.9 % 100 mL IVPB  Status:  Discontinued     2 g 200 mL/hr over 30 Minutes Intravenous Every 8 hours 06/24/19 0714 07/04/19 1238   06/24/19 0800  vancomycin (VANCOCIN) IVPB 1000 mg/200 mL premix  Status:  Discontinued     1,000 mg 200 mL/hr over 60 Minutes Intravenous Every 12 hours 06/24/19 0750 06/25/19 1153   06/24/19 0300  ceFEPIme (MAXIPIME) 2 g in sodium chloride 0.9 % 100 mL IVPB  Status:  Discontinued     2 g 200 mL/hr over 30 Minutes Intravenous Every 12 hours 06/23/19 1700 06/24/19 0714   06/23/19 1700  clindamycin (CLEOCIN) IVPB 600 mg  Status:  Discontinued     600 mg 100 mL/hr over 30 Minutes Intravenous Every 8 hours 06/23/19 1649 06/24/19 0756   06/23/19 1500  vancomycin (VANCOREADY) IVPB 2000 mg/400 mL     2,000 mg 200 mL/hr over 120 Minutes Intravenous  Once 06/23/19 1454 06/23/19 1747   06/23/19 1445  ceFEPIme (MAXIPIME) 2 g in sodium chloride 0.9 % 100 mL IVPB     2 g 200 mL/hr over 30 Minutes Intravenous  Once 06/23/19 1434 06/23/19 1557   06/23/19 1445  metroNIDAZOLE (FLAGYL) IVPB 500 mg     500 mg 100 mL/hr over 60 Minutes Intravenous  Once 06/23/19 1434 06/23/19 1608   06/23/19 1445  vancomycin (VANCOCIN) IVPB 1000 mg/200 mL premix  Status:  Discontinued     1,000 mg 200 mL/hr over 60 Minutes Intravenous  Once 06/23/19 1434 06/23/19 1440   06/23/19 1445  vancomycin (VANCOCIN) 2,000 mg in sodium chloride 0.9 % 500 mL IVPB  Status:  Discontinued     2,000 mg 250 mL/hr over 120 Minutes Intravenous  Once 06/23/19 1440 06/23/19 1454       Assessment/Plan T2DMwith DKA  Acute renal failure - Crnormal, 0.50  Hypokalemia  Schizophrenia Bipolar disorder ABL anemia -hgb 8.6 s/p 1U 4/25 and 4/18  Necrotizing fasciitis of abdominal wall and groin S/P I&D 45 x 30 x 4 cm 06/24/19 and 4/15 by Dr. Derrell Lolling - Noneedto take back to the OR at this time.  - Continue dressing changes TID - Cx's inconclusive.Abx d/c'd 4/23 - Patient now agreeable to  surgery with plastics. Will reach out and let them know  FEN:Reg VTE: SCDs, Lovenox  TD:VVOHYW 4/12; Clinda 4/13 - 4/18 . Cefepime/Vanc 4/12 - 4/23. None currently.Afebrile, WBC 7.5 Foley - in place to aid in wound healing; incontinent of stool Follow-Up - TBD     LOS: 14 days    Jacinto Halim , [facility] Surgery 07/07/2019, 9:20 AM Please see Amion for pager  number during day hours 7:00am-4:30pm

## 2019-07-08 ENCOUNTER — Encounter (HOSPITAL_COMMUNITY)
Admission: EM | Disposition: A | Discharge status: Skilled Nursing Facility | Payer: Self-pay | Source: Home / Self Care | Attending: Internal Medicine

## 2019-07-08 ENCOUNTER — Inpatient Hospital Stay (HOSPITAL_COMMUNITY): Payer: Medicare Other | Admitting: Certified Registered Nurse Anesthetist

## 2019-07-08 ENCOUNTER — Inpatient Hospital Stay (HOSPITAL_COMMUNITY): Payer: Medicare Other

## 2019-07-08 DIAGNOSIS — L0390 Cellulitis, unspecified: Secondary | ICD-10-CM

## 2019-07-08 HISTORY — PX: INCISION AND DRAINAGE OF WOUND: SHX1803

## 2019-07-08 HISTORY — PX: APPLICATION OF A-CELL OF EXTREMITY: SHX6303

## 2019-07-08 LAB — BASIC METABOLIC PANEL
Anion gap: 6 (ref 5–15)
Anion gap: 7 (ref 5–15)
BUN: 10 mg/dL (ref 6–20)
BUN: 11 mg/dL (ref 6–20)
CO2: 28 mmol/L (ref 22–32)
CO2: 26 mmol/L (ref 22–32)
Calcium: 7.5 mg/dL — ABNORMAL LOW (ref 8.9–10.3)
Calcium: 6.8 mg/dL — ABNORMAL LOW (ref 8.9–10.3)
Chloride: 103 mmol/L (ref 98–111)
Chloride: 103 mmol/L (ref 98–111)
Creatinine, Ser: 0.45 mg/dL (ref 0.44–1.00)
Creatinine, Ser: 0.62 mg/dL (ref 0.44–1.00)
GFR calc Af Amer: >60 mL/min (ref >60)
GFR calc Af Amer: >60 mL/min (ref >60)
GFR calc non Af Amer: >60 mL/min (ref >60)
GFR calc non Af Amer: >60 mL/min (ref >60)
Glucose, Bld: 107 mg/dL — ABNORMAL HIGH (ref 70–99)
Glucose, Bld: 174 mg/dL — ABNORMAL HIGH (ref 70–99)
Potassium: 3.6 mmol/L (ref 3.5–5.1)
Potassium: 3.9 mmol/L (ref 3.5–5.1)
Sodium: 137 mmol/L (ref 135–145)
Sodium: 136 mmol/L (ref 135–145)

## 2019-07-08 LAB — CBC
HCT: 28.5 % — ABNORMAL LOW (ref 36.0–46.0)
HCT: 25.5 % — ABNORMAL LOW (ref 36.0–46.0)
Hemoglobin: 8.6 g/dL — ABNORMAL LOW (ref 12.0–15.0)
Hemoglobin: 7.7 g/dL — ABNORMAL LOW (ref 12.0–15.0)
MCH: 27 pg (ref 26.0–34.0)
MCH: 27.5 pg (ref 26.0–34.0)
MCHC: 30.2 g/dL (ref 30.0–36.0)
MCHC: 30.2 g/dL (ref 30.0–36.0)
MCV: 89.6 fL (ref 80.0–100.0)
MCV: 91.1 fL (ref 80.0–100.0)
Platelets: 527 10*3/uL — ABNORMAL HIGH (ref 150–400)
Platelets: 481 10*3/uL — ABNORMAL HIGH (ref 150–400)
RBC: 3.18 MIL/uL — ABNORMAL LOW (ref 3.87–5.11)
RBC: 2.8 MIL/uL — ABNORMAL LOW (ref 3.87–5.11)
RDW: 16.8 % — ABNORMAL HIGH (ref 11.5–15.5)
RDW: 16.5 % — ABNORMAL HIGH (ref 11.5–15.5)
WBC: 7.1 10*3/uL (ref 4.0–10.5)
WBC: 7.7 10*3/uL (ref 4.0–10.5)
nRBC: 0.3 % — ABNORMAL HIGH (ref 0.0–0.2)
nRBC: 0.3 % — ABNORMAL HIGH (ref 0.0–0.2)

## 2019-07-08 LAB — LACTIC ACID, PLASMA: Lactic Acid, Venous: 2.7 mmol/L (ref 0.5–1.9)

## 2019-07-08 LAB — GLUCOSE, CAPILLARY
Glucose-Capillary: 104 mg/dL — ABNORMAL HIGH (ref 70–99)
Glucose-Capillary: 99 mg/dL (ref 70–99)
Glucose-Capillary: 119 mg/dL — ABNORMAL HIGH (ref 70–99)
Glucose-Capillary: 115 mg/dL — ABNORMAL HIGH (ref 70–99)
Glucose-Capillary: 168 mg/dL — ABNORMAL HIGH (ref 70–99)

## 2019-07-08 SURGERY — IRRIGATION AND DEBRIDEMENT WOUND
Anesthesia: General | Site: Abdomen

## 2019-07-08 MED ORDER — FENTANYL CITRATE (PF) 250 MCG/5ML IJ SOLN
INTRAMUSCULAR | Status: DC | PRN
  Administered 2019-07-08 (×2): 50 ug via INTRAVENOUS
  Administered 2019-07-08: 100 ug via INTRAVENOUS
  Administered 2019-07-08 (×3): 50 ug via INTRAVENOUS

## 2019-07-08 MED ORDER — ALBUMIN HUMAN 5 % IV SOLN
25.0000 g | Freq: Once | INTRAVENOUS | Status: DC
  Filled 2019-07-08: qty 500

## 2019-07-08 MED ORDER — SODIUM CHLORIDE 0.9 % IV SOLN: INTRAVENOUS | Status: AC

## 2019-07-08 MED ORDER — CEFAZOLIN SODIUM-DEXTROSE 2-4 GM/100ML-% IV SOLN
2.0000 g | INTRAVENOUS | Status: AC
  Administered 2019-07-08: 2 g via INTRAVENOUS
  Filled 2019-07-08: qty 100

## 2019-07-08 MED ORDER — SUCCINYLCHOLINE CHLORIDE 200 MG/10ML IV SOSY
PREFILLED_SYRINGE | INTRAVENOUS | Status: AC
  Filled 2019-07-08: qty 10

## 2019-07-08 MED ORDER — MIDAZOLAM HCL 5 MG/5ML IJ SOLN
INTRAMUSCULAR | Status: DC | PRN
  Administered 2019-07-08: 2 mg via INTRAVENOUS

## 2019-07-08 MED ORDER — FENTANYL CITRATE (PF) 250 MCG/5ML IJ SOLN
INTRAMUSCULAR | Status: AC
  Filled 2019-07-08: qty 5

## 2019-07-08 MED ORDER — METRONIDAZOLE IN NACL 5-0.79 MG/ML-% IV SOLN
500.0000 mg | Freq: Three times a day (TID) | INTRAVENOUS | Status: DC
  Administered 2019-07-08 – 2019-07-09 (×2): 500 mg via INTRAVENOUS
  Filled 2019-07-08 (×2): qty 100

## 2019-07-08 MED ORDER — LACTATED RINGERS IV SOLN: INTRAVENOUS | Status: DC | PRN

## 2019-07-08 MED ORDER — SODIUM CHLORIDE 0.9 % IR SOLN
Status: DC | PRN
  Administered 2019-07-08: 1000 mL

## 2019-07-08 MED ORDER — HYDROMORPHONE HCL 1 MG/ML IJ SOLN
INTRAMUSCULAR | Status: AC
  Filled 2019-07-08: qty 0.5

## 2019-07-08 MED ORDER — PHENYLEPHRINE 40 MCG/ML (10ML) SYRINGE FOR IV PUSH (FOR BLOOD PRESSURE SUPPORT)
PREFILLED_SYRINGE | INTRAVENOUS | Status: DC | PRN
  Administered 2019-07-08 (×2): 120 ug via INTRAVENOUS
  Administered 2019-07-08 (×2): 80 ug via INTRAVENOUS

## 2019-07-08 MED ORDER — ROCURONIUM BROMIDE 10 MG/ML (PF) SYRINGE
PREFILLED_SYRINGE | INTRAVENOUS | Status: DC | PRN
  Administered 2019-07-08 (×2): 10 mg via INTRAVENOUS
  Administered 2019-07-08: 40 mg via INTRAVENOUS
  Administered 2019-07-08: 20 mg via INTRAVENOUS

## 2019-07-08 MED ORDER — SODIUM CHLORIDE 0.9 % IV SOLN
2.0000 g | Freq: Once | INTRAVENOUS | Status: DC
  Administered 2019-07-08: 2 g via INTRAVENOUS

## 2019-07-08 MED ORDER — LIDOCAINE 2% (20 MG/ML) 5 ML SYRINGE
INTRAMUSCULAR | Status: DC | PRN
  Administered 2019-07-08: 40 mg via INTRAVENOUS

## 2019-07-08 MED ORDER — ALBUMIN HUMAN 5 % IV SOLN: INTRAVENOUS | Status: DC | PRN

## 2019-07-08 MED ORDER — SODIUM CHLORIDE 0.9 % IV BOLUS
250.0000 mL | Freq: Once | INTRAVENOUS | Status: AC
  Administered 2019-07-08: 250 mL via INTRAVENOUS

## 2019-07-08 MED ORDER — PHENYLEPHRINE HCL (PRESSORS) 10 MG/ML IV SOLN
INTRAVENOUS | Status: AC
  Filled 2019-07-08: qty 2

## 2019-07-08 MED ORDER — LIDOCAINE 2% (20 MG/ML) 5 ML SYRINGE
INTRAMUSCULAR | Status: AC
  Filled 2019-07-08: qty 5

## 2019-07-08 MED ORDER — SODIUM CHLORIDE 0.9 % IV SOLN: INTRAVENOUS | Status: DC

## 2019-07-08 MED ORDER — SODIUM CHLORIDE 0.9 % IV SOLN: INTRAVENOUS | Status: DC | PRN

## 2019-07-08 MED ORDER — ONDANSETRON HCL 4 MG/2ML IJ SOLN
INTRAMUSCULAR | Status: DC | PRN
  Administered 2019-07-08: 4 mg via INTRAVENOUS

## 2019-07-08 MED ORDER — SODIUM CHLORIDE 0.9 % IV SOLN
2.0000 g | Freq: Three times a day (TID) | INTRAVENOUS | Status: DC
  Administered 2019-07-09 – 2019-07-12 (×10): 2 g via INTRAVENOUS
  Filled 2019-07-08 (×12): qty 2

## 2019-07-08 MED ORDER — PHENYLEPHRINE HCL-NACL 10-0.9 MG/250ML-% IV SOLN
INTRAVENOUS | Status: DC | PRN
  Administered 2019-07-08: 20 ug/min via INTRAVENOUS

## 2019-07-08 MED ORDER — IBUPROFEN 600 MG PO TABS
600.0000 mg | ORAL_TABLET | Freq: Once | ORAL | Status: AC | PRN
  Administered 2019-07-09: 600 mg via ORAL
  Filled 2019-07-08: qty 1

## 2019-07-08 MED ORDER — VANCOMYCIN HCL IN DEXTROSE 1-5 GM/200ML-% IV SOLN
1000.0000 mg | Freq: Two times a day (BID) | INTRAVENOUS | Status: DC
  Administered 2019-07-08 – 2019-07-11 (×7): 1000 mg via INTRAVENOUS
  Filled 2019-07-08 (×9): qty 200

## 2019-07-08 MED ORDER — 0.9 % SODIUM CHLORIDE (POUR BTL) OPTIME
TOPICAL | Status: DC | PRN
  Administered 2019-07-08 (×2): 1000 mL

## 2019-07-08 MED ORDER — LACTATED RINGERS IV BOLUS
1000.0000 mL | Freq: Once | INTRAVENOUS | Status: AC
  Administered 2019-07-08: 1000 mL via INTRAVENOUS

## 2019-07-08 MED ORDER — HYDROMORPHONE HCL 1 MG/ML IJ SOLN
INTRAMUSCULAR | Status: DC | PRN
  Administered 2019-07-08: .5 mg via INTRAVENOUS

## 2019-07-08 MED ORDER — MIDAZOLAM HCL 2 MG/2ML IJ SOLN
INTRAMUSCULAR | Status: AC
  Filled 2019-07-08: qty 2

## 2019-07-08 MED ORDER — PHENYLEPHRINE 40 MCG/ML (10ML) SYRINGE FOR IV PUSH (FOR BLOOD PRESSURE SUPPORT)
PREFILLED_SYRINGE | INTRAVENOUS | Status: AC
  Filled 2019-07-08: qty 10

## 2019-07-08 MED ORDER — SODIUM CHLORIDE 0.9 % IR SOLN
Status: DC | PRN
  Administered 2019-07-08: 3000 mL

## 2019-07-08 MED ORDER — METOPROLOL TARTRATE 5 MG/5ML IV SOLN
2.5000 mg | Freq: Once | INTRAVENOUS | Status: AC
  Administered 2019-07-08: 2.5 mg via INTRAVENOUS
  Filled 2019-07-08: qty 5

## 2019-07-08 MED ORDER — PROPOFOL 10 MG/ML IV BOLUS
INTRAVENOUS | Status: DC | PRN
  Administered 2019-07-08 (×2): 30 mg via INTRAVENOUS
  Administered 2019-07-08: 140 mg via INTRAVENOUS

## 2019-07-08 MED ORDER — VANCOMYCIN HCL IN DEXTROSE 1-5 GM/200ML-% IV SOLN: 1000.0000 mg | Freq: Once | INTRAVENOUS | Status: DC

## 2019-07-08 MED ORDER — SUCCINYLCHOLINE CHLORIDE 200 MG/10ML IV SOSY
PREFILLED_SYRINGE | INTRAVENOUS | Status: DC | PRN
  Administered 2019-07-08: 140 mg via INTRAVENOUS

## 2019-07-08 MED ORDER — SUGAMMADEX SODIUM 200 MG/2ML IV SOLN
INTRAVENOUS | Status: DC | PRN
  Administered 2019-07-08: 300 mg via INTRAVENOUS

## 2019-07-08 SURGICAL SUPPLY — 63 items
BLADE SURG 15 STRL LF DISP TIS (BLADE) ×1 IMPLANT
BLADE SURG 15 STRL SS (BLADE) ×3
BNDG GAUZE ELAST 4 BULKY (GAUZE/BANDAGES/DRESSINGS) ×8 IMPLANT
CANISTER SUCT 1200ML W/VALVE (MISCELLANEOUS) ×1 IMPLANT
CANISTER WOUND CARE 500ML ATS (WOUND CARE) IMPLANT
CHLORAPREP W/TINT 26 (MISCELLANEOUS) IMPLANT
COVER SURGICAL LIGHT HANDLE (MISCELLANEOUS) ×1 IMPLANT
COVER WAND RF STERILE (DRAPES) ×1 IMPLANT
DERMACARRIERS GRAFT 1 TO 1.5 (DISPOSABLE) ×3
DRAIN PENROSE 1/2X12 LTX STRL (WOUND CARE) ×2 IMPLANT
DRAIN PENROSE 1X12 (DRAIN) ×2 IMPLANT
DRAPE INCISE IOBAN 66X45 STRL (DRAPES) ×2 IMPLANT
DRAPE LAPAROTOMY T 98X78 PEDS (DRAPES) ×2 IMPLANT
DRAPE ORTHO SPLIT 77X108 STRL (DRAPES) ×9
DRAPE SURG ORHT 6 SPLT 77X108 (DRAPES) IMPLANT
DRSG EMULSION OIL 3X3 NADH (GAUZE/BANDAGES/DRESSINGS) IMPLANT
DRSG MEPITEL 4X7.2 (GAUZE/BANDAGES/DRESSINGS) ×8 IMPLANT
DRSG PAD ABDOMINAL 8X10 ST (GAUZE/BANDAGES/DRESSINGS) ×14 IMPLANT
DRSG TELFA 3X8 NADH (GAUZE/BANDAGES/DRESSINGS) IMPLANT
DRSG VAC ATS LRG SENSATRAC (GAUZE/BANDAGES/DRESSINGS) IMPLANT
DRSG VAC ATS MED SENSATRAC (GAUZE/BANDAGES/DRESSINGS) IMPLANT
ELECT CAUTERY BLADE 6.4 (BLADE) ×3 IMPLANT
ELECT REM PT RETURN 9FT ADLT (ELECTROSURGICAL) ×3
ELECTRODE REM PT RTRN 9FT ADLT (ELECTROSURGICAL) ×1 IMPLANT
GAUZE SPONGE 4X4 12PLY STRL (GAUZE/BANDAGES/DRESSINGS) ×1 IMPLANT
GAUZE XEROFORM 5X9 LF (GAUZE/BANDAGES/DRESSINGS) ×2 IMPLANT
GLOVE BIO SURGEON STRL SZ7 (GLOVE) ×10 IMPLANT
GLOVE BIOGEL M STRL SZ7.5 (GLOVE) ×4 IMPLANT
GLOVE BIOGEL PI IND STRL 6.5 (GLOVE) IMPLANT
GLOVE BIOGEL PI IND STRL 8 (GLOVE) ×1 IMPLANT
GLOVE BIOGEL PI INDICATOR 6.5 (GLOVE) ×4
GLOVE BIOGEL PI INDICATOR 8 (GLOVE) ×6
GLOVE SURG SS PI 6.0 STRL IVOR (GLOVE) ×2 IMPLANT
GOWN SPEC L3 XXLG W/TWL (GOWN DISPOSABLE) ×2 IMPLANT
GOWN STRL REUS W/ TWL LRG LVL3 (GOWN DISPOSABLE) ×2 IMPLANT
GOWN STRL REUS W/TWL LRG LVL3 (GOWN DISPOSABLE) ×12
GRAFT DERMACARRIERS 1 TO 1.5 (DISPOSABLE) IMPLANT
GRAFT MESHED PRIMATRIX 10X12 (Graft) ×2 IMPLANT
HANDPIECE INTERPULSE COAX TIP (DISPOSABLE) ×3
IV NS IRRIG 3000ML ARTHROMATIC (IV SOLUTION) ×2 IMPLANT
KIT BASIN OR (CUSTOM PROCEDURE TRAY) ×3 IMPLANT
KIT TURNOVER KIT A (KITS) ×3 IMPLANT
MICROMATRIX 1000MG (Tissue) ×3 IMPLANT
NS IRRIG 1000ML POUR BTL (IV SOLUTION) ×5 IMPLANT
PACK GENERAL/GYN (CUSTOM PROCEDURE TRAY) ×3 IMPLANT
PAD DRESSING TELFA 3X8 NADH (GAUZE/BANDAGES/DRESSINGS) IMPLANT
PENCIL BUTTON HOLSTER BLD 10FT (ELECTRODE) ×2 IMPLANT
PRIMATRIX AG 10X25 (Tissue) ×2 IMPLANT
PROBE DEBRIDE SONICVAC MISONIX (TIP) ×2 IMPLANT
SET HNDPC FAN SPRY TIP SCT (DISPOSABLE) IMPLANT
SOLUTION BETADINE 4OZ (MISCELLANEOUS) ×7 IMPLANT
SOLUTION PARTIC MCRMTRX 1000MG (Tissue) IMPLANT
SPONGE LAP 18X18 X RAY DECT (DISPOSABLE) ×4 IMPLANT
STAPLER VISISTAT 35W (STAPLE) ×6 IMPLANT
SUT ETHILON 3 0 FSL (SUTURE) ×4 IMPLANT
SUT PDS AB 3-0 SH 27 (SUTURE) ×10 IMPLANT
SWAB COLLECTION DEVICE MRSA (MISCELLANEOUS) IMPLANT
SYR CONTROL 10ML LL (SYRINGE) ×1 IMPLANT
TOWEL GREEN STERILE (TOWEL DISPOSABLE) ×3 IMPLANT
TOWEL GREEN STERILE FF (TOWEL DISPOSABLE) ×3 IMPLANT
TUBE CONNECTING 12'X1/4 (SUCTIONS) ×1
TUBE CONNECTING 12X1/4 (SUCTIONS) ×1 IMPLANT
TUBE IRRIGATION SET MISONIX (TUBING) ×2 IMPLANT

## 2019-07-08 NOTE — Progress Notes (Signed)
PROGRESS NOTE  Floride Hutmacher  ZYS:[id number] DOB: Oct 26, 1975 DOA: 06/23/2019 PCP: Patient, No Pcp Per   Brief Narrative:  Betzy Barbier is a 44 y.o. female with a history of schizophrenia, bipolar disorder, obesity and T2DM who presented to the ED 4/12 with altered mental status found to be in DKA and with necrotizing soft tissue infection over lower abdominal wall and upper RLE extending into perineum. She was taken for extensive debridement by surgery on 4/13 and subsequently developed shock requiring pressors. Transient oliguria was noted, improved with fluid resuscitation. Taken back to ED for further debridement 4/15 and ultimately liberated from pressors and extubated 4/16. DKA has resolved and the patient has been transferred to hospitalist service on 4/18, to PCU 4/19.   Subjective:  Still reports some pain at surgical site, at baseline, overall controlled , denies any nausea, chest pain or shortness of breath.    Assessment & Plan: Active Problems:   DKA (diabetic ketoacidoses) (HCC)   Necrotizing cellulitis   Septic shock ([place])   AKI (acute kidney injury) ([place])   Acute respiratory failure ([place])  Septic shock due to necrotizing soft tissue infection:  - Complicated by uncontrolled hyperglycemia due to diabetes. Blood cultures negative and wound cultures w/no dominant organism. - Continue IV antibiotics including vancomycin, cefepime.  Cultures are inconclusive, appears to be improving per general surgery, currently antibiotic stopped 4/23. - Continue po and IV pain control, this have been managed by general surgery - Wound care per general surgery. s/p large debridement (45cc x 30cm) on 4/13 and repeat debridement of 3-5cm additional tissue from bilateral edges 4/15.No current plans for repeat debridement by general surgery . -Plastic surgery input greatly appreciated, plan for surgerytoday  for some type of wound closure, and matrix application, will keep patient n.p.o. after  midnight . -very Low albumin, <1,I  have discussed at length with her to be compliant with her supplements, and improve her general status so her wound can heal. - continue with wound dressing per general surgery recommendation, has increased to 3 times daily, -continue vitamin C, zinc. -Nutritionist consulted to address nutrition status.  Acute blood loss anemia:  - Following extensive surgery, s/p 2 u PRBCs during hospital stay so far, most recent hemoglobin is 8.6, will continue to monitor closely and transfuse as needed, as she expected to need some further transfusion during hospital stay, is she having some expected small amount of oozing from her wound.  Acute hypoxic respiratory failure: s/p ETT 4/13 - 4/16.  - Continue supplemental oxygen if needed to maintain SpO2 90%.   DKA, uncontrolled T2DM: DKA resolved. HbA1c 14.4%. -He is currently on sliding scale, CBGs are controlled,  on low-dose Levemir 5 units  - Will benefit from diabetes education once outside scope of such severe illness.  AKI: Resolved.   Poor IV access: And will require IV therapies.  - PICC left brachial 4/19  Hypokalemia:  -potassium 3.4 this morning, repleted.  Hypernatremia:  - Hydrate po, monitor.   Acute metabolic encephalopathy: Due to ICU delirium, shock, metabolic imbalances. - Delirium precautions, continue home psychotropic medications as below.  - Continue tube feeds.  Bipolar disorder, schizophrenia:  - Continue haldol, temazepam, divalproex and cogentin. Mentation improving.  - Note valproic acid level undetectable at admission.  Loose stools: In setting of tube feeds.  - DCed clindamycin - Abd benign. Will follow exam, fever curve, leukocytosis, and output.  - Can DC flexiseal if/when stools no longer loose, continues to need this.  Hypoalbuminemia -Encouraged  to be compliant with her supplements  RN Pressure Injury Documentation: Pressure Injury 06/23/19 Buttocks Left (Active)    06/23/19 1945  Location: Buttocks  Location Orientation: Left  Staging:   Wound Description (Comments):   Present on Admission: Yes    Obesity: Estimated body mass index is 39.4 kg/m as calculated from the following:   Height as of this encounter: 5\' 7"  (1.702 m).   Weight as of this encounter: 114.1 kg.  DVT prophylaxis: Lovenox Code Status: Full Family Communication: Discussed with mother at bedside 4/25 Disposition Plan:  Status is: Inpatient  Remains inpatient appropriate because: She remains on IV antibiotics, remains with massive wound requiring further debridement.   Dispo: The patient is from: Home              Anticipated d/c is to: CIR              Anticipated d/c date is: > 3 days              Patient currently is not medically stable to d/c.  Consultants:   PCCM  General surgery  Plastic surgery  Procedures:   Central line placement 4/14  ETT 4/13 - 4/16 06/24/19 IRRIGATION AND DEBRIDEMENT RIGHT GROIN AND RIGHT BUTTOCK 06/26/19, MD  06/24/19 IRRIGATION AND DEBRIDEMENT OF 06/26/19 Archie Patten, MD  06/26/19 IRRIGATION AND DEBRIDEMENT ABDOMINAL WALL AND GROIN       Antimicrobials: Metronidazole x1 (4/12) Cefepime (4/12) >> Vancomycin (4/12) >> Clindamycin (4/12) - 4/18    Objective: Vitals:   07/08/19 1205 07/08/19 1210 07/08/19 1215 07/08/19 1220  BP: 114/60 (!) 116/55 (!) 120/52   Pulse: (!) 110 (!) 109 (!) 109 (!) 109  Resp: (!) 29 (!) 33 (!) 35 (!) 35  Temp:      TempSrc:      SpO2: 100% 100% 99% 100%  Weight:      Height:        Intake/Output Summary (Last 24 hours) at 07/08/2019 1413 Last data filed at 07/08/2019 1412 Gross per 24 hour  Intake 590 ml  Output 1310 ml  Net -720 ml   Filed Weights   07/06/19 0450 07/07/19 0500 07/08/19 0414  Weight: 118.4 kg 114.2 kg 114.1 kg     Awake Alert, Oriented X 3, No new F.N deficits, Normal affect Symmetrical Chest wall movement, Good air movement bilaterally, CTAB RRR,No  Gallops,Rubs or new Murmurs, No Parasternal Heave +ve B.Sounds, Abd Soft, No tenderness, No rebound - guarding or rigidity. No Cyanosis, Clubbing or edema.   Data Reviewed: I have personally reviewed following labs and imaging studies  CBC: Recent Labs  Lab 07/04/19 0317 07/05/19 0348 07/06/19 0344 07/07/19 0406 07/08/19 0413  WBC 11.3* 8.6 8.3 7.5 7.1  HGB 7.5* 7.5* 7.3* 8.6* 8.6*  HCT 25.1* 25.2* 24.5* 28.1* 28.5*  MCV 89.0 90.0 89.7 89.2 89.6  PLT 385 472* 494* 548* 527*   Basic Metabolic Panel: Recent Labs  Lab 07/04/19 0317 07/05/19 0348 07/06/19 0344 07/07/19 0406 07/08/19 0413  NA 136 138 138 140 137  K 3.4* 3.4* 3.7 3.7 3.6  CL 104 105 105 104 103  CO2 23 26 27 28 28   GLUCOSE 123* 108* 113* 99 107*  BUN 11 8 8  <5* 10  CREATININE 0.56 0.50 0.48 0.50 0.45  CALCIUM 7.6* 7.4* 7.4* 7.5* 7.5*   GFR: Estimated Creatinine Clearance: 117 mL/min (by C-G formula based on SCr of 0.45 mg/dL). Liver Function Tests: Recent Labs  Lab 07/07/19 0406  ALBUMIN <1.0*   No results for input(s): LIPASE, AMYLASE in the last 168 hours. No results for input(s): AMMONIA in the last 168 hours. Coagulation Profile: No results for input(s): INR, PROTIME in the last 168 hours. Cardiac Enzymes: No results for input(s): CKTOTAL, CKMB, CKMBINDEX, TROPONINI in the last 168 hours. BNP (last 3 results) No results for input(s): PROBNP in the last 8760 hours. HbA1C: No results for input(s): HGBA1C in the last 72 hours. CBG: Recent Labs  Lab 07/07/19 1233 07/07/19 1710 07/07/19 2114 07/08/19 0749 07/08/19 1104  GLUCAP 127* 136* 125* 104* 99   Lipid Profile: No results for input(s): CHOL, HDL, LDLCALC, TRIG, CHOLHDL, LDLDIRECT in the last 72 hours. Thyroid Function Tests: No results for input(s): TSH, T4TOTAL, FREET4, T3FREE, THYROIDAB in the last 72 hours. Anemia Panel: No results for input(s): VITAMINB12, FOLATE, FERRITIN, TIBC, IRON, RETICCTPCT in the last 72 hours. Urine  analysis:    Component Value Date/Time   COLORURINE YELLOW 06/23/2019 1715   APPEARANCEUR CLOUDY (A) 06/23/2019 1715   LABSPEC 1.028 06/23/2019 1715   PHURINE 6.0 06/23/2019 1715   GLUCOSEU >=500 (A) 06/23/2019 1715   HGBUR LARGE (A) 06/23/2019 1715   BILIRUBINUR NEGATIVE 06/23/2019 1715   KETONESUR 20 (A) 06/23/2019 1715   PROTEINUR 30 (A) 06/23/2019 1715   NITRITE NEGATIVE 06/23/2019 1715   LEUKOCYTESUR LARGE (A) 06/23/2019 1715   No results found for this or any previous visit (from the past 240 hour(s)).    Radiology Studies: No results found.  Scheduled Meds: . [MAR Hold] acetaminophen  1,000 mg Oral Q6H  . [MAR Hold] vitamin C  500 mg Oral BID  . [MAR Hold] benztropine  1 mg Oral BID  . [MAR Hold] chlorhexidine  15 mL Mouth Rinse BID  . [MAR Hold] Chlorhexidine Gluconate Cloth  6 each Topical Daily  . [MAR Hold] enoxaparin (LOVENOX) injection  40 mg Subcutaneous Q24H  . [MAR Hold] feeding supplement  1 Container Oral TID BM  . [MAR Hold] feeding supplement (GLUCERNA SHAKE)  237 mL Oral TID BM  . [MAR Hold] feeding supplement (PRO-STAT SUGAR FREE 64)  60 mL Oral BID  . [MAR Hold] Gerhardt's butt cream   Topical BID  . [MAR Hold] haloperidol  10 mg Oral q morning - 10a  . [MAR Hold] haloperidol  20 mg Oral QHS  . [MAR Hold] insulin aspart  0-5 Units Subcutaneous QHS  . [MAR Hold] insulin aspart  0-9 Units Subcutaneous TID WC  . [MAR Hold] insulin detemir  5 Units Subcutaneous Daily  . [MAR Hold] mouth rinse  15 mL Mouth Rinse q12n4p  . [MAR Hold] multivitamin with minerals  1 tablet Oral Daily  . [MAR Hold] nutrition supplement (JUVEN)  1 packet Oral BID BM  . [MAR Hold] pantoprazole sodium  40 mg Oral Daily  . [MAR Hold] sodium chloride flush  10-40 mL Intracatheter Q12H  . [MAR Hold] valproic acid  250 mg Oral BID  . [MAR Hold] zinc sulfate  220 mg Oral Daily   Continuous Infusions: . sodium chloride Stopped (07/03/19 2246)     LOS: 15 days    Huey Bienenstock, MD [facility] [email or web address] 07/08/2019, 2:13 PM

## 2019-07-08 NOTE — Anesthesia Preprocedure Evaluation (Addendum)
Anesthesia Evaluation  Patient identified by MRN, date of birth, ID band Patient awake    Reviewed: Allergy & Precautions, NPO status , Patient's Chart, lab work & pertinent test results  Airway Mallampati: III  TM Distance: >3 FB   Mouth opening: Limited Mouth Opening  Dental  (+) Poor Dentition, Chipped, Missing, Dental Advisory Given,    Pulmonary    Pulmonary exam normal        Cardiovascular  Rhythm:Regular Rate:Normal     Neuro/Psych    GI/Hepatic   Endo/Other    Renal/GU      Musculoskeletal   Abdominal (+) + obese,   Peds  Hematology   Anesthesia Other Findings   Reproductive/Obstetrics                            Anesthesia Physical Anesthesia Plan  ASA: III  Anesthesia Plan: General   Post-op Pain Management:    Induction: Intravenous  PONV Risk Score and Plan: 4 or greater and Ondansetron, Midazolam and Treatment may vary due to age or medical condition  Airway Management Planned: Oral ETT and Video Laryngoscope Planned  Additional Equipment: None  Intra-op Plan:   Post-operative Plan: Extubation in OR  Informed Consent: I have reviewed the patients History and Physical, chart, labs and discussed the procedure including the risks, benefits and alternatives for the proposed anesthesia with the patient or authorized representative who has indicated his/her understanding and acceptance.     Dental advisory given  Plan Discussed with: CRNA  Anesthesia Plan Comments:         Anesthesia Quick Evaluation

## 2019-07-08 NOTE — Op Note (Signed)
Operative Note   DATE OF OPERATION: 07/08/2019  SURGICAL DEPARTMENT: Plastic Surgery  PREOPERATIVE DIAGNOSES:  Abdominal and Right Thigh Wound  POSTOPERATIVE DIAGNOSES:  same  PROCEDURE:  1. Debridement of abdominal and right thigh wound including skin and subcutaneous tissue 60x23cm 2. Closure of right abdominal wound with adjacent tissue transfer totaling 15x15cm including the primary and secondary defect 3. Closure of left abdominal wound with adjacent tissue transfer totaling 22x15cm including the primary and secondary defect 4. Application of meshed primatrix Ag to right thigh wound totaling 23x23cm 5. Application of micromatrix powder to right and left abdominal wounds  SURGEON: Ancil Linsey, MD  ASSISTANT: Joni Fears, PA  ANESTHESIA:  General.   COMPLICATIONS: None.   INDICATIONS FOR PROCEDURE:  The patient, Kimberly Orozco is a 44 y.o. female born on [birth year], is here for treatment of complex abdominal and right thigh wound from necrotizing fasciitis.   MRN: [id number]  CONSENT:  Informed consent was obtained directly from the patient. Risks, benefits and alternatives were fully discussed. Specific risks including but not limited to bleeding, infection, hematoma, seroma, scarring, pain, contracture, asymmetry, wound healing problems, and need for further surgery were all discussed. The patient did have an ample opportunity to have questions answered to satisfaction.   DESCRIPTION OF PROCEDURE:  The patient was taken to the operating room. SCDs were placed and Ancef antibiotics were given. General anesthesia was administered.  The patient's operative site was prepped and draped in a sterile fashion. A time out was performed and all information was confirmed to be correct.  The majority of the wound appeared healthy.  Further excisional debridement was required in the lower abdominal area on both sides.  The non-viable skin and subcutaneous tissues were excised with  knife and cautery.  The entire wound surface was the debrided with the Misonix debridement device.  The wound was then irrigated with pulse lavage.  I then undermined the abdominal skin superficial to the fascia to advance and close the abdominal portion of the wound on both sides.  Micromatrix powder was placed before closure.  This was done with PDS mattress sutures over a penrose drain on each side.  Penrose was brought out through a separate incision and sutured with Nylon.  Meshed primatrix Ag was then brought on to the field and rinsed.  It was stapled in place over the right thigh wound.  Mepitel was then stapled over that.  Wound was dressed with surgilube, Kerlix, ABDs, and tape.  The patient tolerated the procedure well.  There were no complications. The patient was allowed to wake from anesthesia, extubated and taken to the recovery room in satisfactory condition.

## 2019-07-08 NOTE — Anesthesia Postprocedure Evaluation (Signed)
Anesthesia Post Note  Patient: Jolette Lana  Procedure(s) Performed: Debridement of abdominal and groin wound (N/A Abdomen) with placement of primatrix AG (N/A Abdomen)     Patient location during evaluation: PACU Anesthesia Type: General Level of consciousness: awake and alert Pain management: pain level controlled Vital Signs Assessment: post-procedure vital signs reviewed and stable Respiratory status: spontaneous breathing, nonlabored ventilation, respiratory function stable and patient connected to nasal cannula oxygen Cardiovascular status: blood pressure returned to baseline and stable Postop Assessment: no apparent nausea or vomiting Anesthetic complications: no    Last Vitals:  Vitals:   07/08/19 1632 07/08/19 1700  BP: 126/82 120/68  Pulse: (!) 124 (!) 121  Resp: (!) 25 20  Temp:  37.1 C  SpO2: 100% 98%    Last Pain:  Vitals:   07/08/19 1700  TempSrc: Axillary  PainSc:                  Shelton Silvas

## 2019-07-08 NOTE — Transfer of Care (Signed)
Immediate Anesthesia Transfer of Care Note  Patient: Kimberly Orozco  Procedure(s) Performed: Debridement of abdominal and groin wound (N/A Abdomen) with placement of primatrix AG (N/A Abdomen)  Patient Location: PACU  Anesthesia Type:General  Level of Consciousness: awake and alert   Airway & Oxygen Therapy: Patient Spontanous Breathing and Patient connected to face mask oxygen  Post-op Assessment: Report given to RN and Post -op Vital signs reviewed and stable  Post vital signs: Reviewed and stable  Last Vitals:  Vitals Value Taken Time  BP 142/106 07/08/19 1547  Temp    Pulse 132 07/08/19 1551  Resp 20 07/08/19 1551  SpO2 100 % 07/08/19 1551  Vitals shown include unvalidated device data.  Last Pain:  Vitals:   07/08/19 1041  TempSrc: Oral  PainSc:       Patients Stated Pain Goal: 0 (07/08/19 0930)  Complications: No apparent anesthesia complications

## 2019-07-08 NOTE — Progress Notes (Signed)
Patient is a 44 yr-old female who underwent debridement of abdominal and right thigh wound; closure of right abdominal wound and left abdominal wound; and application of meshed primatrix AG to the right thigh covered by mepital today with Dr. Arita Miss. Patient tolerated procedure well and is resting in the PACU. Dressing will be left in place until Friday. May replace ABDs and tape as needed. Leave Kerlix and remainder of dressing in place.

## 2019-07-08 NOTE — Anesthesia Procedure Notes (Signed)
Procedure Name: Intubation Date/Time: 07/08/2019 1:18 PM Performed by: Nils Pyle, CRNA Pre-anesthesia Checklist: Patient identified, Emergency Drugs available, Suction available and Patient being monitored Patient Re-evaluated:Patient Re-evaluated prior to induction Oxygen Delivery Method: Circle System Utilized Preoxygenation: Pre-oxygenation with 100% oxygen Induction Type: IV induction and Rapid sequence Ventilation: Mask ventilation without difficulty Laryngoscope Size: Glidescope and 3 Grade View: Grade I Tube type: Oral Tube size: 7.5 mm Number of attempts: 1 Airway Equipment and Method: Stylet and Oral airway Placement Confirmation: ETT inserted through vocal cords under direct vision,  positive ETCO2 and breath sounds checked- equal and bilateral Secured at: 22 cm Tube secured with: Tape Dental Injury: Teeth and Oropharynx as per pre-operative assessment

## 2019-07-08 NOTE — Progress Notes (Signed)
Pharmacy Antibiotic Note  Kimberly Orozco is a 44 y.o. female admitted on 06/23/2019 with necrotizing soft tissue infection. Pharmacy has been consulted to resume vancomycin/cefepime - last doses 4/23. SCr stable 0.45. Previous vancomycin trough was 14 on 1g IV q12h (used trough only due to national shortage of vancomycin assays).  Plan: Cefepime 2g IV q8h Resume vancomycin 1g IV Q 12 hrs. Goal AUC 400-550. Expected AUC: 456 SCr used: 0.8 Monitor clinical progress, c/s, renal function F/u de-escalation plan/LOT, vancomycin levels as indicated   Height: 5\' 7"  (170.2 cm) Weight: 114.1 kg (251 lb 8.7 oz) IBW/kg (Calculated) : 61.6  Temp (24hrs), Avg:99.6 F (37.6 C), Min:98.7 F (37.1 C), Max:101.5 F (38.6 C)  Recent Labs  Lab 07/04/19 0317 07/05/19 0348 07/06/19 0344 07/07/19 0406 07/08/19 0413  WBC 11.3* 8.6 8.3 7.5 7.1  CREATININE 0.56 0.50 0.48 0.50 0.45    Estimated Creatinine Clearance: 117 mL/min (by C-G formula based on SCr of 0.45 mg/dL).    No Known Allergies  Antimicrobials this admission: Vanc 4/12 >>4/23; 4/27>> Cefepime 4/12>>4/23; 4/27>> Clindamycin 4/12>>4/18 Flagyl 4/12 x1  Dose adjustments this admission: 4/17 VT: 14 - continue 1gm IV q12h  Microbiology results: 4/13 Surg cx (labia abscess): normal vaginal flora, no anaerobes 4/12 UCx - multiple species 4/12 MRSA PCR - positive 4/12 Covid - negative 4/12 Influenza - negative 4/12 BCx - neg   6/12, PharmD, BCPS Please check AMION for all [facility] contact numbers Clinical Pharmacist 07/08/2019 10:29 PM

## 2019-07-08 NOTE — Interval H&P Note (Signed)
History and Physical Interval Note:  07/08/2019 11:10 AM  Kimberly Orozco  has presented today for surgery, with the diagnosis of abdominal and groin wound.  The various methods of treatment have been discussed with the patient and family. After consideration of risks, benefits and other options for treatment, the patient has consented to  Procedure(s) with comments: Debridement of abdominal and groin wound (N/A) - 90 min with placement of primatrix AG (N/A) as a surgical intervention.  The patient's history has been reviewed, patient examined, no change in status, stable for surgery.  I have reviewed the patient's chart and labs.  Questions were answered to the patient's satisfaction.     Allena Napoleon

## 2019-07-08 NOTE — Brief Op Note (Signed)
06/23/2019 - 07/08/2019  3:31 PM  PATIENT:  Kimberly Orozco  44 y.o. female  PRE-OPERATIVE DIAGNOSIS:  abdominal and groin wound  POST-OPERATIVE DIAGNOSIS:  abdominal and groin wound  PROCEDURE:  Procedure(s): Debridement of abdominal and groin wound (N/A) with placement of primatrix AG (N/A)  SURGEON:  Surgeon(s) and Role:    * Cutberto Winfree, Wendy Poet, MD - Primary  PHYSICIAN ASSISTANT: Joni Fears, PA  ASSISTANTS: none   ANESTHESIA:   general  EBL:  450 mL   BLOOD ADMINISTERED:none  DRAINS: Penrose drain in the abdomen   LOCAL MEDICATIONS USED:  NONE  SPECIMEN:  No Specimen  DISPOSITION OF SPECIMEN:  PATHOLOGY  COUNTS:  YES  TOURNIQUET:  * No tourniquets in log *  DICTATION: .Dragon Dictation  PLAN OF CARE: Admit to inpatient   PATIENT DISPOSITION:  PACU - hemodynamically stable.   Delay start of Pharmacological VTE agent (>24hrs) due to surgical blood loss or risk of bleeding: not applicable

## 2019-07-09 ENCOUNTER — Inpatient Hospital Stay (HOSPITAL_COMMUNITY): Payer: Medicare Other

## 2019-07-09 ENCOUNTER — Inpatient Hospital Stay: Payer: Self-pay

## 2019-07-09 ENCOUNTER — Encounter: Payer: Self-pay | Admitting: *Deleted

## 2019-07-09 DIAGNOSIS — R52 Pain, unspecified: Secondary | ICD-10-CM

## 2019-07-09 LAB — BASIC METABOLIC PANEL
Anion gap: 8 (ref 5–15)
BUN: 13 mg/dL (ref 6–20)
CO2: 24 mmol/L (ref 22–32)
Calcium: 6.8 mg/dL — ABNORMAL LOW (ref 8.9–10.3)
Chloride: 106 mmol/L (ref 98–111)
Creatinine, Ser: 0.66 mg/dL (ref 0.44–1.00)
GFR calc Af Amer: >60 mL/min (ref >60)
GFR calc non Af Amer: >60 mL/min (ref >60)
Glucose, Bld: 121 mg/dL — ABNORMAL HIGH (ref 70–99)
Potassium: 3.6 mmol/L (ref 3.5–5.1)
Sodium: 138 mmol/L (ref 135–145)

## 2019-07-09 LAB — CBC
HCT: 20.7 % — ABNORMAL LOW (ref 36.0–46.0)
Hemoglobin: 6.1 g/dL — CL (ref 12.0–15.0)
MCH: 26.6 pg (ref 26.0–34.0)
MCHC: 29.5 g/dL — ABNORMAL LOW (ref 30.0–36.0)
MCV: 90.4 fL (ref 80.0–100.0)
Platelets: 397 10*3/uL (ref 150–400)
RBC: 2.29 MIL/uL — ABNORMAL LOW (ref 3.87–5.11)
RDW: 16.7 % — ABNORMAL HIGH (ref 11.5–15.5)
WBC: 7.6 10*3/uL (ref 4.0–10.5)
nRBC: 0 % (ref 0.0–0.2)

## 2019-07-09 LAB — GLUCOSE, CAPILLARY
Glucose-Capillary: 117 mg/dL — ABNORMAL HIGH (ref 70–99)
Glucose-Capillary: 130 mg/dL — ABNORMAL HIGH (ref 70–99)
Glucose-Capillary: 134 mg/dL — ABNORMAL HIGH (ref 70–99)
Glucose-Capillary: 176 mg/dL — ABNORMAL HIGH (ref 70–99)
Glucose-Capillary: 107 mg/dL — ABNORMAL HIGH (ref 70–99)

## 2019-07-09 LAB — HEMOGLOBIN AND HEMATOCRIT, BLOOD
HCT: 26.5 % — ABNORMAL LOW (ref 36.0–46.0)
Hemoglobin: 8.3 g/dL — ABNORMAL LOW (ref 12.0–15.0)

## 2019-07-09 LAB — PREPARE RBC (CROSSMATCH)

## 2019-07-09 LAB — LACTIC ACID, PLASMA: Lactic Acid, Venous: 2.4 mmol/L (ref 0.5–1.9)

## 2019-07-09 MED ORDER — LACTATED RINGERS IV BOLUS (SEPSIS)
1000.0000 mL | Freq: Once | INTRAVENOUS | Status: AC
  Administered 2019-07-09: 1000 mL via INTRAVENOUS

## 2019-07-09 MED ORDER — SODIUM CHLORIDE 0.9% FLUSH
10.0000 mL | INTRAVENOUS | Status: DC | PRN
  Administered 2019-07-22: 10 mL

## 2019-07-09 MED ORDER — SODIUM CHLORIDE 0.9% IV SOLUTION: Freq: Once | INTRAVENOUS | Status: DC

## 2019-07-09 MED ORDER — ALBUMIN HUMAN 5 % IV SOLN
25.0000 g | Freq: Once | INTRAVENOUS | Status: AC
  Administered 2019-07-09: 25 g via INTRAVENOUS
  Filled 2019-07-09: qty 500

## 2019-07-09 MED ORDER — SODIUM CHLORIDE 0.9% FLUSH
10.0000 mL | Freq: Two times a day (BID) | INTRAVENOUS | Status: DC
  Administered 2019-07-09 – 2019-07-15 (×9): 10 mL
  Administered 2019-07-16: 20 mL
  Administered 2019-07-16 – 2019-07-21 (×4): 10 mL

## 2019-07-09 MED ORDER — CLINDAMYCIN PHOSPHATE 900 MG/50ML IV SOLN
900.0000 mg | Freq: Three times a day (TID) | INTRAVENOUS | Status: DC
  Administered 2019-07-09 – 2019-07-11 (×7): 900 mg via INTRAVENOUS
  Filled 2019-07-09 (×9): qty 50

## 2019-07-09 MED ORDER — LACTATED RINGERS IV SOLN
INTRAVENOUS | Status: DC
  Administered 2019-07-22: 1000 mL via INTRAVENOUS

## 2019-07-09 MED ORDER — NOREPINEPHRINE 4 MG/250ML-% IV SOLN
0.0000 ug/min | INTRAVENOUS | Status: DC
  Administered 2019-07-09: 5 ug/min via INTRAVENOUS
  Filled 2019-07-09 (×2): qty 250

## 2019-07-09 NOTE — Significant Event (Addendum)
Rapid Response Event Note  Overview: Called originally at 2319 d/t MEWS RED d/t T-101.5, HR-126, RR-41. At that time, the MD had been notified and ordered 250 NS bolus, MIVF-NS @ 100, BMP,CBC, LA, PCXR, Maxipime/Flagy and 1L LR bolus.  Per RN, pt was alert and oriented.   RRT called again at 0058 d/t SBP-80s. MD already notified and had ordered 2 additional liters LR be given and pt was started on clindamycin.   Initial Focused Assessment: Pt laying in bed with eyes closed. Pt lethargic but will awaken easily. Pt is alert and oriented, denies chest pain and SOB but says she doesn't fell well. Lungs clear, diminished in bases. Skin warm and dry. Pt has had <10cc UOP since 9AM 4/27 per chart (RN flushed foley with 10cc NS). T-101, BP-82/39, HR-120(ST), RR-40, SpO2-98% on RA.    Interventions: 25g 5% Albumin  PCCM to come see pt-Will tx to [id number]  Plan of Care (if not transferred): Give Albumin. Await PCCM consultation. Call RRT if further assistance needed.   Update: 0220-Will tx to 57M 0313-Levo started, BP-62/29 Event Summary:  Dr. Antionette Char to bedside @ 0120 Called: 0058 Arrived: 0103    Terrilyn Saver

## 2019-07-09 NOTE — NC FL2 (Signed)
[place] LEVEL OF CARE SCREENING TOOL     IDENTIFICATION  Patient Name: Kimberly Orozco Birthdate: 13-Jul-1975 Sex: female Admission Date (Current Location): 06/23/2019  [facility] and [place] Number:  Herbalist and Address:  The [place], [place] [address] [place], [place] [address]      Provider Number: [id number]  Attending Physician Name and Address:  Audria Nine, DO  Relative Name and Phone Number:  [name] [phone]    Current Level of Care: Hospital Recommended Level of Care: [place] Prior Approval Number:    Date Approved/Denied: 07/09/19 PASRR Number: [id number]  Discharge Plan: SNF    Current Diagnoses: Patient Active Problem List   Diagnosis Date Noted  . Septic shock ([place])   . AKI (acute kidney injury) ([place])   . Acute respiratory failure ([place])   . Necrotizing cellulitis   . DKA (diabetic ketoacidoses) ([place]) 06/23/2019  . Diabetes mellitus type 2 in obese ([place]) 04/27/2019  . Bipolar disorder ([place]) 04/26/2019  . Schizophrenia ([place]) 04/25/2019    Orientation RESPIRATION BLADDER Height & Weight     Self, Time, Situation, Place  Normal Continent(Uretheral Catheter) Weight: 215 lb 9.8 oz (97.8 kg) Height:  5\' 7"  (170.2 cm)  BEHAVIORAL SYMPTOMS/MOOD NEUROLOGICAL BOWEL NUTRITION STATUS      Incontinent(type 6/Mushy consistency with ragged edges) Diet(See Discharge Summary)  AMBULATORY STATUS COMMUNICATION OF NEEDS Skin   Extensive Assist Verbally Surgical wounds, Other (Comment)(Pressure injury Buttocks left incision closed right leg, Incision closed abdomen wound/incision open or dehised thigh right open incision through right perineium,blister cracking eccymo. heel toe bil. anter. poster. arm left mosist. breast groin buttock)                       Personal Care Assistance Level of Assistance  Bathing, Feeding, Dressing Bathing Assistance: Maximum assistance Feeding assistance:  Independent(able to feed self/Carb modified) Dressing Assistance: Maximum assistance     Functional Limitations Info  Sight, Hearing, Speech Sight Info: Adequate Hearing Info: Adequate Speech Info: Adequate    SPECIAL CARE FACTORS FREQUENCY  PT (By licensed PT), OT (By licensed OT)     PT Frequency: 5x min weekly OT Frequency: 5x min weekly            Contractures Contractures Info: Not present    Additional Factors Info  Code Status Code Status Info: FULL             Current Medications (07/09/2019):  This is the current hospital active medication list Current Facility-Administered Medications  Medication Dose Route Frequency Provider Last Rate Last Admin  . 0.9 %  sodium chloride infusion (Manually program via Guardrails IV Fluids)   Intravenous Once Opyd, Ilene Qua, MD      . 0.9 %  sodium chloride infusion  250 mL Intravenous Continuous Neva Seat, MD   Stopped at 07/03/19 2246  . acetaminophen (TYLENOL) tablet 1,000 mg  1,000 mg Oral Q6H Jillyn Ledger, PA-C   1,000 mg at 07/09/19 1113  . ascorbic acid (VITAMIN C) tablet 500 mg  500 mg Oral BID Patrecia Pour, MD   500 mg at 07/09/19 1113  . benztropine (COGENTIN) tablet 1 mg  1 mg Oral BID Patrecia Pour, MD   1 mg at 07/08/19 2036  . ceFEPIme (MAXIPIME) 2 g in sodium chloride 0.9 % 100 mL IVPB  2 g Intravenous Q8H von Dohlen, Haley B, RPH 200 mL/hr at 07/09/19 0607 2 g at  07/09/19 1245  . chlorhexidine (PERIDEX) 0.12 % solution 15 mL  15 mL Mouth Rinse BID Beola Cord, MD   15 mL at 07/08/19 1721  . Chlorhexidine Gluconate Cloth 2 % PADS 6 each  6 each Topical Daily Beola Cord, MD   6 each at 07/08/19 0830  . clindamycin (CLEOCIN) IVPB 900 mg  900 mg Intravenous Q8H Opyd, Lavone Neri, MD 100 mL/hr at 07/09/19 0124 900 mg at 07/09/19 0124  . dextrose 50 % solution 0-50 mL  0-50 mL Intravenous PRN Beola Cord, MD      . feeding supplement (BOOST / RESOURCE BREEZE) liquid 1 Container  1  Container Oral TID BM Tyrone Nine, MD   1 Container at 07/08/19 1727  . feeding supplement (GLUCERNA SHAKE) (GLUCERNA SHAKE) liquid 237 mL  237 mL Oral TID BM Elgergawy, Leana Roe, MD   237 mL at 07/09/19 1119  . Gerhardt's butt cream   Topical BID Beola Cord, MD   Given at 07/07/19 2035  . haloperidol (HALDOL) tablet 10 mg  10 mg Oral q morning - 10a Tyrone Nine, MD   10 mg at 07/09/19 1112  . haloperidol (HALDOL) tablet 20 mg  20 mg Oral QHS Tyrone Nine, MD   20 mg at 07/07/19 2033  . insulin aspart (novoLOG) injection 0-5 Units  0-5 Units Subcutaneous QHS Hazeline Junker B, MD      . insulin aspart (novoLOG) injection 0-9 Units  0-9 Units Subcutaneous TID WC Tyrone Nine, MD   1 Units at 07/09/19 1132  . insulin detemir (LEVEMIR) injection 5 Units  5 Units Subcutaneous Daily Elgergawy, Leana Roe, MD   5 Units at 07/09/19 1135  . lactated ringers infusion   Intravenous Continuous Coralyn Helling, MD 125 mL/hr at 07/09/19 0600 Rate Verify at 07/09/19 0600  . MEDLINE mouth rinse  15 mL Mouth Rinse q12n4p Beola Cord, MD   15 mL at 07/07/19 1650  . methocarbamol (ROBAXIN) tablet 500 mg  500 mg Oral Q6H PRN Jacinto Halim, PA-C   500 mg at 07/08/19 2010  . morphine 2 MG/ML injection 2 mg  2 mg Intravenous Q2H PRN Jacinto Halim, PA-C   2 mg at 07/08/19 2254  . multivitamin with minerals tablet 1 tablet  1 tablet Oral Daily Tyrone Nine, MD   1 tablet at 07/09/19 1134  . norepinephrine (LEVOPHED) 4mg  in premix infusion  0-40 mcg/min Intravenous Titrated , MD 7.5 mL/hr at 07/09/19 0800 2 mcg/min at 07/09/19 0800  . nutrition supplement (JUVEN) (JUVEN) powder packet 1 packet  1 packet Oral BID BM 07/11/19, MD   1 packet at 07/09/19 1119  . oxyCODONE (Oxy IR/ROXICODONE) immediate release tablet 5-10 mg  5-10 mg Oral Q4H PRN 07/11/19, PA-C   10 mg at 07/09/19 1224  . pantoprazole sodium (PROTONIX) 40 mg/20 mL oral suspension 40 mg  40 mg Oral Daily 07/11/19, MD   40 mg at 07/09/19 1118  . polyethylene glycol (MIRALAX / GLYCOLAX) packet 17 g  17 g Oral Daily PRN 07/11/19 B, MD      . sodium chloride flush (NS) 0.9 % injection 10-40 mL  10-40 mL Intracatheter Q12H 09-02-1996, MD   10 mL at 07/08/19 2034  . sodium chloride flush (NS) 0.9 % injection 10-40 mL  10-40 mL Intracatheter PRN 09-02-1996, MD      . valproic acid (DEPAKENE) 250 MG/5ML solution  250 mg  250 mg Oral BID Hazeline Junker B, MD   250 mg at 07/09/19 1117  . vancomycin (VANCOCIN) IVPB 1000 mg/200 mL premix  1,000 mg Intravenous Q12H von Dohlen, Haley B, RPH 200 mL/hr at 07/09/19 1224 1,000 mg at 07/09/19 1224  . zinc sulfate capsule 220 mg  220 mg Oral Daily Tyrone Nine, MD   220 mg at 07/09/19 1113     Discharge Medications: Please see discharge summary for a list of discharge medications.  Relevant Imaging Results:  Relevant Lab Results:   Additional Information [id number]  Terrial Rhodes, LCSWA

## 2019-07-09 NOTE — Progress Notes (Addendum)
PROGRESS NOTE    Kimberly Orozco  EPP:[id number] DOB: 1976/01/12 DOA: 06/23/2019 PCP: Patient, No Pcp Per   Chief Complaint  Patient presents with  . Hyperglycemia    Brief Narrative:  Kimberly Orozco is a 44 y.o. female with a history of schizophrenia, bipolar disorder, obesity and T2DM who presented to the ED 4/12 with altered mental status found to be in DKA and with necrotizing soft tissue infection over lower abdominal wall and upper RLE extending into perineum. She was taken for extensive debridement by surgery on 4/13 and subsequently developed shock requiring pressors. Transient oliguria was noted, improved with fluid resuscitation. Taken back to OR for further debridement 4/15 and ultimately liberated from pressors and extubated 4/16. DKA resolved and the patient was transferred to hospitalist service on 4/18, to PCU 4/19.   Antibiotics were stopped 4/23.   Patient underwent another debridement of abdominal and right thigh wounds the afternoon of 4/27 with closure of abdominal wound. When she returned to [facility] [place], HR was in 140s (had been 110-range past 2 days), and she developed a fever (had been afebrile >1 week). She was given a liter of lactated ringers, blood cultures collected, and broad-spectrum antibiotics restarted. Chemistry panel, CBC, lactic acid, and CXR were ordered.    Assessment & Plan:   1. Sepsis secondary to unknown organism  - Admitted 15 days ago with septic shock secondary to necrotizing fasciitis, now off antibiotics since 4/23, underwent another debridement this afternoon and then developed fever and tachycardia to 140s  - Cultures were repeated, 1 liter lactated ringers was given, and broad-spectrum antibiotics resumed - BP came down to 87/44 -> give additional 2.5 liters LR to complete 30cc/kg bolus, trend lactate, follow closely in progressive unit and consult with PCCM if she fails to improve with these measures    ADDENDUM:  SBP low 80s despite 3 liters  crystalloid. Patient is alert and oriented, denies lightheadedness or chest pain, and is not in any apparent respiratory distress. Discussed with PCCM, their care much appreciated.  500 cc 5% albumin was recommended by PCCM, orders placed and plan discussed with RN.     Consultants:   PCCM  General surgery  Plastic surgery  Procedures:   Central line placement 4/14  ETT 4/13 - 4/16 06/24/19 IRRIGATION AND DEBRIDEMENT RIGHT GROIN AND RIGHT BUTTOCK Kimberly Ok, MD  06/24/19 IRRIGATION AND DEBRIDEMENT OF Kimberly Orozco Kimberly Ok, MD  06/26/19 IRRIGATION AND DEBRIDEMENT ABDOMINAL WALL AND GROIN   07/08/19 - Debridement of abdominal and right thigh wounds, closure of abdominal wounds  Antimicrobials: Metronidazole x1 (4/12), 4/27 >>  Cefepime (4/12) >> 4/23, 4/27 >>  Vancomycin (4/12) >> 4/23, 4/27 >>  Clindamycin (4/12) - 4/18, 4/27 >>     Subjective: Abdominal pain   Objective: Vitals:   07/08/19 2139 07/08/19 2300 07/09/19 0003 07/09/19 0005  BP:   (!) 92/45 (!) 87/44  Pulse: (!) 136 (!) 127 (!) 125 (!) 124  Resp: (!) 39 (!) 41 (!) 26 (!) 28  Temp: (!) 101.5 F (38.6 C)   (!) 101 F (38.3 C)  TempSrc: Oral   Axillary  SpO2: 96% 98% 97% 97%  Weight:      Height:        Intake/Output Summary (Last 24 hours) at 07/09/2019 0017 Last data filed at 07/08/2019 2300 Gross per 24 hour  Intake 2200 ml  Output 1225 ml  Net 975 ml   Filed Weights   07/06/19 0450 07/07/19 0500 07/08/19 0414  Weight: 118.4  kg 114.2 kg 114.1 kg    Examination:  General exam: Appears calm and comfortable  Respiratory system: Tachypneic. Respiratory effort normal. Cardiovascular system: Rate ~120 and regular. No pedal edema. Gastrointestinal system: Abdomen is nondistended, soft and nontender. Normal bowel sounds heard. Central nervous system: Alert and oriented. No focal neurological deficits. Extremities: Symmetric 5 x 5 power. Skin: Right thigh wound, abdominal incisions    Psychiatry: Judgement and insight appear normal. Mood & affect appropriate.     Data Reviewed: I have personally reviewed following labs and imaging studies  CBC: Recent Labs  Lab 07/05/19 0348 07/06/19 0344 07/07/19 0406 07/08/19 0413 07/08/19 2152  WBC 8.6 8.3 7.5 7.1 7.7  HGB 7.5* 7.3* 8.6* 8.6* 7.7*  HCT 25.2* 24.5* 28.1* 28.5* 25.5*  MCV 90.0 89.7 89.2 89.6 91.1  PLT 472* 494* 548* 527* 481*    Basic Metabolic Panel: Recent Labs  Lab 07/05/19 0348 07/06/19 0344 07/07/19 0406 07/08/19 0413 07/08/19 2152  NA 138 138 140 137 136  K 3.4* 3.7 3.7 3.6 3.9  CL 105 105 104 103 103  CO2 26 27 28 28 26   GLUCOSE 108* 113* 99 107* 174*  BUN 8 8 <5* 10 11  CREATININE 0.50 0.48 0.50 0.45 0.62  CALCIUM 7.4* 7.4* 7.5* 7.5* 6.8*    GFR: Estimated Creatinine Clearance: 117 mL/min (by C-G formula based on SCr of 0.62 mg/dL).  Liver Function Tests: Recent Labs  Lab 07/07/19 0406  ALBUMIN <1.0*    CBG: Recent Labs  Lab 07/08/19 0749 07/08/19 1104 07/08/19 1551 07/08/19 1706 07/08/19 2143  GLUCAP 104* 99 119* 115* 168*     No results found for this or any previous visit (from the past 240 hour(s)).       Radiology Studies: DG CHEST PORT 1 VIEW  Result Date: 07/08/2019 CLINICAL DATA:  Respiratory distress. EXAM: PORTABLE CHEST 1 VIEW COMPARISON:  June 25, 2019 FINDINGS: Decreased lung volumes are noted which is likely secondary to suboptimal patient inspiration. The endotracheal tube, nasogastric tube and right internal jugular venous catheter seen on the prior study have been removed. A left-sided PICC line is seen with its distal tip noted at the junction of the superior vena cava and right atrium. There is no evidence of acute infiltrate, pleural effusion or pneumothorax. A 9.9 cm long thin, linear radiopaque foreign body structure is seen overlying the mid to upper left lung and is not seen on the prior exam. The heart size and mediastinal contours are  within normal limits. The visualized skeletal structures are unremarkable. IMPRESSION: No active disease. Electronically Signed   By: June 27, 2019 M.D.   On: 07/08/2019 22:35        Scheduled Meds: . acetaminophen  1,000 mg Oral Q6H  . vitamin C  500 mg Oral BID  . benztropine  1 mg Oral BID  . chlorhexidine  15 mL Mouth Rinse BID  . Chlorhexidine Gluconate Cloth  6 each Topical Daily  . enoxaparin (LOVENOX) injection  40 mg Subcutaneous Q24H  . feeding supplement  1 Container Oral TID BM  . feeding supplement (GLUCERNA SHAKE)  237 mL Oral TID BM  . feeding supplement (PRO-STAT SUGAR FREE 64)  60 mL Oral BID  . Gerhardt's butt cream   Topical BID  . haloperidol  10 mg Oral q morning - 10a  . haloperidol  20 mg Oral QHS  . insulin aspart  0-5 Units Subcutaneous QHS  . insulin aspart  0-9 Units Subcutaneous TID WC  .  insulin detemir  5 Units Subcutaneous Daily  . mouth rinse  15 mL Mouth Rinse q12n4p  . multivitamin with minerals  1 tablet Oral Daily  . nutrition supplement (JUVEN)  1 packet Oral BID BM  . pantoprazole sodium  40 mg Oral Daily  . sodium chloride flush  10-40 mL Intracatheter Q12H  . valproic acid  250 mg Oral BID  . zinc sulfate  220 mg Oral Daily   Continuous Infusions: . sodium chloride Stopped (07/03/19 2246)  . sodium chloride 100 mL/hr at 07/08/19 2045  . ceFEPime (MAXIPIME) IV    . lactated ringers     And  . lactated ringers    . metronidazole 500 mg (07/08/19 2331)  . vancomycin 1,000 mg (07/08/19 2332)     LOS: 16 days   Patient is critically-ill and requiring highly complex decision making in order to prevent further life-threatening deterioration.   Time spent: 47 minutes.     Briscoe Deutscher, MD [facility]   To contact the attending provider between 7A-7P or the covering provider during after hours 7P-7A, please log into the web site [email or web address] and access using universal [place] password for that web site. If you do  not have the password, please call the hospital operator.  07/09/2019, 12:17 AM

## 2019-07-09 NOTE — Progress Notes (Signed)
Peripherally Inserted Central Catheter Placement  The IV Nurse has discussed with the patient and/or persons authorized to consent for the patient, the purpose of this procedure and the potential benefits and risks involved with this procedure.  The benefits include less needle sticks, lab draws from the catheter, and the patient may be discharged home with the catheter. Risks include, but not limited to, infection, bleeding, blood clot (thrombus formation), and puncture of an artery; nerve damage and irregular heartbeat and possibility to perform a PICC exchange if needed/ordered by physician.  Alternatives to this procedure were also discussed.  Bard Power PICC patient education guide, fact sheet on infection prevention and patient information card has been provided to patient /or left at bedside.  PICC exchange for DL  PICC Placement Documentation  PICC Double Lumen 07/09/19 PICC Left Brachial 41 cm 0 cm (Active)  Indication for Insertion or Continuance of Line Vasoactive infusions 07/09/19 2147  Exposed Catheter (cm) 0 cm 07/09/19 2147  Site Assessment Clean;Dry;Intact 07/09/19 2147  Lumen #1 Status Flushed;Saline locked;Blood return noted 07/09/19 2147  Lumen #2 Status Saline locked;Flushed;Blood return noted 07/09/19 2147  Dressing Type Transparent 07/09/19 2147  Dressing Status Clean;Dry;Intact;Antimicrobial disc in place 07/09/19 2147  Line Care Other (Comment) 07/09/19 2147  Dressing Intervention New dressing 07/09/19 2147  Dressing Change Due 07/16/19 07/09/19 2147       Ethelda Chick 07/09/2019, 9:48 PM

## 2019-07-09 NOTE — Progress Notes (Signed)
PT Cancellation Note  Patient Details Name: Kimberly Orozco MRN: [id number] DOB: 12-Jun-1975   Cancelled Treatment:    Reason Eval/Treat Not Completed: Patient not medically ready.  Due to Plastics having closed site and placed AG mesh/matrix, will not see today. 07/09/2019  Jacinto Halim., PT Acute Rehabilitation Services [phone]  (pager) [phone]  (office)   Kimberly Orozco 07/09/2019, 10:33 AM

## 2019-07-09 NOTE — Progress Notes (Signed)
eLink Physician-Brief Progress Note Patient Name: Kimberly Orozco DOB: 21-Jul-1975 MRN: [id number]   Date of Service  07/09/2019  HPI/Events of Note  Patient has single lumen PICC line. Nursing requests exchange for double lumen PICC line.   eICU Interventions  Will order: 1. Exchange PICC line for double lumen PICC line.      Intervention Category Major Interventions: Other:  Lenell Antu 07/09/2019, 8:45 PM

## 2019-07-09 NOTE — Consult Note (Signed)
Dear Doctor:  This patient has been identified as a candidate for PICC conversion from single lumen to triple lumen for the following reason (s): IV therapy over 48 hours, drug pH or osmolality (causing phlebitis, infiltration in 24 hours) and poor veins/poor circulatory system (CHF, COPD, emphysema, diabetes, steroid use, IV drug abuse, etc.) If you agree, please write an order for the indicated device.   Thank you for supporting the early vascular access assessment program.

## 2019-07-09 NOTE — Progress Notes (Signed)
Lactic acid 2.4, Hb 6.1.  PRBC transfusion orders placed by hospitalist.  Will hold lovenox, and use SCDs.  F/u Hb after transfusion.  Coralyn Helling, MD [facility] Pulmonary/Critical Care Pager - [phone] 07/09/2019, 3:40 AM

## 2019-07-09 NOTE — Progress Notes (Addendum)
1 Day Post-Op  Subjective: CC: S/p OR with Plastics yesterday where she underwent debridement of abdominal and right thigh wound; closure of right abdominal wound and the left abdominal wound; and application of meshed primatrix AG to the right thigh covered by Mepitel yesterday with Dr. Arita Miss.  Overnight was transferred to ICU after becoming febrile, tachycardic and hypotensive. CXR clear. Lactic acid elevated. Bcx pending. Plastics has evaluated wounds this am. Required 1U PRBC this am. She does complain of right calf pain. No other complaints.   Objective: Vital signs in last 24 hours: Temp:  [98.2 F (36.8 C)-101.5 F (38.6 C)] 98.2 F (36.8 C) (04/28 0750) Pulse Rate:  [100-141] 102 (04/28 0700) Resp:  [12-41] 31 (04/28 0700) BP: (62-142)/(29-106) 95/58 (04/28 0700) SpO2:  [94 %-100 %] 98 % (04/28 0700) Weight:  [97.8 kg] 97.8 kg (04/28 0420) Last BM Date: 07/09/19  Intake/Output from previous day: 04/27 0701 - 04/28 0700 In: 5130.6 [I.V.:2689.3; Blood:315; IV Piggyback:2116.3] Out: 705 [Urine:255; Blood:450] Intake/Output this shift: No intake/output data recorded.  PE: Gen: Awake and alert, NAD Heart: Tachycardic Lungs: CTA b/l Abd: Soft, appropriately tender near wound Wound: Dressings in place and just changed by RN MSK: Tenderness of the right calf without swelling or edema. No tenderness of the left calf.   Lab Results:  Recent Labs    07/08/19 2152 07/08/19 2152 07/09/19 0215 07/09/19 0738  WBC 7.7  --  7.6  --   HGB 7.7*   < > 6.1* 8.3*  HCT 25.5*   < > 20.7* 26.5*  PLT 481*  --  397  --    < > = values in this interval not displayed.   BMET Recent Labs    07/08/19 2152 07/09/19 0215  NA 136 138  K 3.9 3.6  CL 103 106  CO2 26 24  GLUCOSE 174* 121*  BUN 11 13  CREATININE 0.62 0.66  CALCIUM 6.8* 6.8*   PT/INR No results for input(s): LABPROT, INR in the last 72 hours. CMP     Component Value Date/Time   NA 138 07/09/2019 0215   K 3.6  07/09/2019 0215   CL 106 07/09/2019 0215   CO2 24 07/09/2019 0215   GLUCOSE 121 (H) 07/09/2019 0215   BUN 13 07/09/2019 0215   CREATININE 0.66 07/09/2019 0215   CALCIUM 6.8 (L) 07/09/2019 0215   PROT 6.2 (L) 06/30/2019 0409   ALBUMIN <1.0 (L) 07/07/2019 0406   AST 30 06/30/2019 0409   ALT 29 06/30/2019 0409   ALKPHOS 129 (H) 06/30/2019 0409   BILITOT 0.3 06/30/2019 0409   GFRNONAA >60 07/09/2019 0215   GFRAA >60 07/09/2019 0215   Lipase  No results found for: LIPASE     Studies/Results: DG CHEST PORT 1 VIEW  Result Date: 07/08/2019 CLINICAL DATA:  Respiratory distress. EXAM: PORTABLE CHEST 1 VIEW COMPARISON:  June 25, 2019 FINDINGS: Decreased lung volumes are noted which is likely secondary to suboptimal patient inspiration. The endotracheal tube, nasogastric tube and right internal jugular venous catheter seen on the prior study have been removed. A left-sided PICC line is seen with its distal tip noted at the junction of the superior vena cava and right atrium. There is no evidence of acute infiltrate, pleural effusion or pneumothorax. A 9.9 cm long thin, linear radiopaque foreign body structure is seen overlying the mid to upper left lung and is not seen on the prior exam. The heart size and mediastinal contours are within normal limits. The  visualized skeletal structures are unremarkable. IMPRESSION: No active disease. Electronically Signed   By: Aram Candela M.D.   On: 07/08/2019 22:35    Anti-infectives: Anti-infectives (From admission, onward)   Start     Dose/Rate Route Frequency Ordered Stop   07/09/19 0200  clindamycin (CLEOCIN) IVPB 900 mg     900 mg 100 mL/hr over 30 Minutes Intravenous Every 8 hours 07/09/19 0034     07/08/19 2245  vancomycin (VANCOCIN) IVPB 1000 mg/200 mL premix     1,000 mg 200 mL/hr over 60 Minutes Intravenous Every 12 hours 07/08/19 2231     07/08/19 2245  ceFEPIme (MAXIPIME) 2 g in sodium chloride 0.9 % 100 mL IVPB     2 g 200 mL/hr over  30 Minutes Intravenous Every 8 hours 07/08/19 2231     07/08/19 2215  ceFEPIme (MAXIPIME) 2 g in sodium chloride 0.9 % 100 mL IVPB  Status:  Discontinued     2 g 200 mL/hr over 30 Minutes Intravenous  Once 07/08/19 2210 07/09/19 0000   07/08/19 2215  metroNIDAZOLE (FLAGYL) IVPB 500 mg  Status:  Discontinued     500 mg 100 mL/hr over 60 Minutes Intravenous Every 8 hours 07/08/19 2210 07/09/19 0846   07/08/19 2215  vancomycin (VANCOCIN) IVPB 1000 mg/200 mL premix  Status:  Discontinued     1,000 mg 200 mL/hr over 60 Minutes Intravenous  Once 07/08/19 2210 07/09/19 0001   07/08/19 1115  ceFAZolin (ANCEF) IVPB 2g/100 mL premix     2 g 200 mL/hr over 30 Minutes Intravenous On call to O.R. 07/08/19 1104 07/08/19 1330   06/26/19 1200  Vancomycin (VANCOCIN) 1,500 mg in sodium chloride 0.9 % 500 mL IVPB  Status:  Discontinued     1,500 mg 250 mL/hr over 120 Minutes Intravenous Every 24 hours 06/25/19 1153 06/26/19 0455   06/26/19 1200  vancomycin (VANCOREADY) IVPB 1500 mg/300 mL  Status:  Discontinued     1,500 mg 150 mL/hr over 120 Minutes Intravenous Every 24 hours 06/26/19 0455 06/26/19 0730   06/26/19 0830  vancomycin (VANCOREADY) IVPB 1500 mg/300 mL  Status:  Discontinued     1,000 mg 100 mL/hr over 120 Minutes Intravenous Every 12 hours 06/26/19 0730 06/26/19 0741   06/26/19 0830  vancomycin (VANCOCIN) IVPB 1000 mg/200 mL premix  Status:  Discontinued     1,000 mg 200 mL/hr over 60 Minutes Intravenous Every 12 hours 06/26/19 0742 07/04/19 1238   06/25/19 1145  vancomycin (VANCOCIN) 500 mg in sodium chloride 0.9 % 100 mL IVPB     500 mg 100 mL/hr over 60 Minutes Intravenous NOW 06/25/19 1055 06/25/19 1241   06/24/19 1600  vancomycin (VANCOREADY) IVPB 750 mg/150 mL  Status:  Discontinued     750 mg 150 mL/hr over 60 Minutes Intravenous Every 12 hours 06/23/19 1700 06/24/19 0714   06/24/19 1600  vancomycin (VANCOCIN) IVPB 1000 mg/200 mL premix  Status:  Discontinued     1,000 mg 200 mL/hr  over 60 Minutes Intravenous Every 12 hours 06/24/19 0714 06/24/19 0750   06/24/19 1400  clindamycin (CLEOCIN) IVPB 900 mg  Status:  Discontinued     900 mg 100 mL/hr over 30 Minutes Intravenous Every 8 hours 06/24/19 0756 06/29/19 1137   06/24/19 1145  ceFEPIme (MAXIPIME) 2 g in sodium chloride 0.9 % 100 mL IVPB  Status:  Discontinued     2 g 200 mL/hr over 30 Minutes Intravenous To ShortStay Surgical 06/24/19 1131 06/24/19 1457   06/24/19  1030  ceFEPIme (MAXIPIME) 2 g in sodium chloride 0.9 % 100 mL IVPB  Status:  Discontinued     2 g 200 mL/hr over 30 Minutes Intravenous Every 8 hours 06/24/19 0714 07/04/19 1238   06/24/19 0800  vancomycin (VANCOCIN) IVPB 1000 mg/200 mL premix  Status:  Discontinued     1,000 mg 200 mL/hr over 60 Minutes Intravenous Every 12 hours 06/24/19 0750 06/25/19 1153   06/24/19 0300  ceFEPIme (MAXIPIME) 2 g in sodium chloride 0.9 % 100 mL IVPB  Status:  Discontinued     2 g 200 mL/hr over 30 Minutes Intravenous Every 12 hours 06/23/19 1700 06/24/19 0714   06/23/19 1700  clindamycin (CLEOCIN) IVPB 600 mg  Status:  Discontinued     600 mg 100 mL/hr over 30 Minutes Intravenous Every 8 hours 06/23/19 1649 06/24/19 0756   06/23/19 1500  vancomycin (VANCOREADY) IVPB 2000 mg/400 mL     2,000 mg 200 mL/hr over 120 Minutes Intravenous  Once 06/23/19 1454 06/23/19 1747   06/23/19 1445  ceFEPIme (MAXIPIME) 2 g in sodium chloride 0.9 % 100 mL IVPB     2 g 200 mL/hr over 30 Minutes Intravenous  Once 06/23/19 1434 06/23/19 1557   06/23/19 1445  metroNIDAZOLE (FLAGYL) IVPB 500 mg     500 mg 100 mL/hr over 60 Minutes Intravenous  Once 06/23/19 1434 06/23/19 1608   06/23/19 1445  vancomycin (VANCOCIN) IVPB 1000 mg/200 mL premix  Status:  Discontinued     1,000 mg 200 mL/hr over 60 Minutes Intravenous  Once 06/23/19 1434 06/23/19 1440   06/23/19 1445  vancomycin (VANCOCIN) 2,000 mg in sodium chloride 0.9 % 500 mL IVPB  Status:  Discontinued     2,000 mg 250 mL/hr over 120  Minutes Intravenous  Once 06/23/19 1440 06/23/19 1454       Assessment/Plan T2DMwith DKA  Acute renal failure - Crnormal, 0.50  Hypokalemia  Schizophrenia Bipolar disorder ABL anemia -hgb 8.3 s/p 1U this AM. 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 Rosendo Gros - S/p debridement of abdominal and right thigh wound; closure of right abdominal wound and the left abdominal wound; and application of meshed primatrix AG to the right thigh covered by Mepitel 4/27 with Dr. Claudia Desanctis Dr. Claudia Desanctis of plastics. They would like the dressing to stay in place until Friday - Will defer wound care to them at this time - We will follow peripherally and be available as needed. Please call back with questions or concerns  QTM:AUQJ for diet from our standpoint VTE: SCDs, chemical prophylaxis on hold for anemia  FH:LKTGYB 4/12; Clinda 4/13 - 4/18 . Cefepime/Vanc 4/12 - 4/23. Cefepime/Vanc/Clinda 4/27 >> Foley - in place; will defer timing of removal to plastics   LOS: 16 days    Jillyn Ledger , [facility] Surgery 07/09/2019, 9:47 AM Please see Amion for pager number during day hours 7:00am-4:30pm

## 2019-07-09 NOTE — Progress Notes (Signed)
Kimberly Orozco is a 44 y.o. female with a prolonged hospital course for DKA complicated by on going necrotizing fascitis and septic shock. She was originally admitted to the ICU on 4/12 and transferred out on 4/17. Subsequently, readmitted to the ICU overnight for worsening AMS 1 day s/p wound debridement with hypotension requiring fluid resuscitation and low doses of levo to maintain MAPS >65.   On evaluation today, the patient is alert and orient, but appears sleepy. She is off of pressors with soft blood pressures. She is saturating well at 98% on room air and is protecting her airway well. She has significant drainage form her abdominal wounds and continues to complain of abdominal pain. Although she has received close to 5 liters overnight, she has only produced 700 cc of urine, but has significant insensible loss form her wounds  Sepsis 2/2 to necrotizing fascitis: - Continue leo as need to maintain MAPs>65. - Continue broad spectrum antibiotics with Vancomycin, Cefepime, and Clindamycin. - Appreciate plastic surgery's assistance.  Anemia:  - Patient is s/p transfusion of 1 unit PRBC for an initial Hgb of 6.1. Post transfusion Hgb of 8.3 - Continue to monitor  - Transfuse for Hgb < 7.0  DM - continue SSI and Levemir  Dellia Cloud, D.O. [facility] Internal Medicine, PGY-1 Pager: [phone], Phone: [phone] Date 07/09/2019 Time 10:35 AM

## 2019-07-09 NOTE — Progress Notes (Signed)
PULMONARY / CRITICAL CARE MEDICINE   NAME:  Kimberly Orozco, MRN:  [id number], DOB:  [birth year], LOS: 16 ADMISSION DATE:  06/23/2019, CONSULTATION DATE:  06/23/2019 REFERRING MD:  Dr. Criss Alvine, CHIEF COMPLAINT:  AMS  BRIEF HISTORY:    44 yo female presented to ER with AMS from DKA in setting of necrotizing fasciitis of Rt groin, Lt inguinal crease and Rt medial thigh.  SIGNIFICANT PAST MEDICAL HISTORY   DM type 2, Schizophrenia, Bipolar  SIGNIFICANT EVENTS:  4/12 Admitted to the ICU 4/13 Debridement, remained on vent 4/15 Second Debridement 4/16 Extubated 4/17 transfer out of ICU 4/20 plastic surgery consulted 4/27 debridement of abdominal wound and groin with EBL 450 ml 4/28 febrile, hypotensive, oliguria >> transfer to ICU  CONSULTS:  Surgery necrotizing fasciitis 4/12 Plastic surgery abdominal wounds 4/20  STUDIES:    CT abd/pelvis 4/12 >> extensive dermal thickening and subcutaneous fat stranding in the lower anterior abdominal wall, b/l inguinal regions and Rt perineum.  Cholelithiasis, fibroid uterus.  CT Rt femur 4/12 >> dermal thickening and subcutaneous fat stranding from lower anterior abdominal wall through Rt inguinal region and perineum involving anterior proximal Rt thigh, significant osteoarthritis of Rt hip and knee  CULTURES:  SARS CoV2 PCR 4/12 >> negative Blood 4/12 >> negative Labia abscess 4/13 >> normal vaginal flora Blood 4/27 >>   ANTIBIOTICS:  Clindamycin 4/12 >> 4/17 Vancomycin 4/12 >> 4/23 Cefepime 4/12 >> 4/23  Cefepime 4/27 >> Flagyl 4/27 >> Vancomycin 4/27 >> Clindamycin 4/27 >>  LINES/TUBES:  ETT 4/13 >> 4/16 Lt single lumen PICC 4/19 >>  SUBJECTIVE:  Feels weak and achy.  OBJECTIVE:  BP (!) 91/50   Pulse (!) 110   Temp 100.2 F (37.9 C) (Axillary)   Resp (!) 31   Ht 5\' 7"  (1.702 m)   Wt 114.1 kg   SpO2 99%   BMI 39.40 kg/m   I/O last 3 completed shifts: In: 2440 [P.O.:240; I.V.:1850; IV Piggyback:350] Out: 1535  [Urine:1085; Blood:450]  PHYSICAL EXAM:  General - alert Eyes - pupils reactive ENT - no sinus tenderness, no stridor Cardiac - regular, tachycardic Chest - equal breath sounds b/l, no wheezing or rales Abdomen - soft, wound dressing on with drains Extremities - wound dressing around Rt thigh with oozing of serous fluid Neuro - normal strength, moves extremities, follows commands Psych - normal mood and behavior   RESOLVED PROBLEM LIST  DKA, Acute respiratory failure with hypoxia, Acute metabolic encephalopathy from sepsis, AKI from ATN in setting of sepsis, Diarrhea from tube feedings, Hypernatremia  ASSESSMENT AND PLAN    Sepsis from necrotizing fasciitis of abdominal wall, perineum and Rt thigh. - recurrent hypotension, fever after debridement on 4/27 - ABx resumed on 4/27 - check lactic acid - continue IV fluids - transfer to ICU for possible pressor needs, with goal MAP > 65 - surgery, plastic surgery following  Oliguria. - f/u BMET - monitor urine outpt - continue IV fluids  Anemia after surgery. - f/u CBC - transfuse for Hb < 7  DM type 2 poorly controlled with hyperglycemia. - SSI with levemir  Hx of Schizophrenia, Bipolar. - continue haldol, valproic acid, cogentin  Obesity. - Body mass index is 39.4 kg/m.   Pressure injury. - Lt buttock, present on admission   BEST PRACTICE:  Diet: NPO, sips with meds DVT prophylaxis: Lovenox GI prophylaxis: protonix Mobility: Bedrest Code Status: FULL Disposition: Transfer to ICU 4/27  LABS:   CMP Latest Ref Rng & Units 07/08/2019 07/08/2019 07/07/2019  Glucose 70 - 99 mg/dL 174(H) 107(H) 99  BUN 6 - 20 mg/dL 11 10 <5(L)  Creatinine 0.44 - 1.00 mg/dL 0.62 0.45 0.50  Sodium 135 - 145 mmol/L 136 137 140  Potassium 3.5 - 5.1 mmol/L 3.9 3.6 3.7  Chloride 98 - 111 mmol/L 103 103 104  CO2 22 - 32 mmol/L 26 28 28   Calcium 8.9 - 10.3 mg/dL 6.8(L) 7.5(L) 7.5(L)  Total Protein 6.5 - 8.1 g/dL - - -  Total Bilirubin  0.3 - 1.2 mg/dL - - -  Alkaline Phos 38 - 126 U/L - - -  AST 15 - 41 U/L - - -  ALT 0 - 44 U/L - - -    CBC Latest Ref Rng & Units 07/08/2019 07/08/2019 07/07/2019  WBC 4.0 - 10.5 K/uL 7.7 7.1 7.5  Hemoglobin 12.0 - 15.0 g/dL 7.7(L) 8.6(L) 8.6(L)  Hematocrit 36.0 - 46.0 % 25.5(L) 28.5(L) 28.1(L)  Platelets 150 - 400 K/uL 481(H) 527(H) 548(H)    ABG    Component Value Date/Time   PHART 7.437 06/25/2019 0625   PCO2ART 24.5 (L) 06/25/2019 0625   PO2ART 118.0 (H) 06/25/2019 0625   HCO3 16.3 (L) 06/25/2019 0625   TCO2 17 (L) 06/25/2019 0625   ACIDBASEDEF 6.0 (H) 06/25/2019 0625   O2SAT 99.0 06/25/2019 0625    CBG (last 3)  Recent Labs    07/08/19 1551 07/08/19 1706 07/08/19 2143  GLUCAP 119* 115* 168*    CRITICAL CARE TIME: 40 minutes   Chesley Mires, MD [place] Pager - [phone] 07/09/2019, 2:51 AM

## 2019-07-09 NOTE — Progress Notes (Signed)
1 Day Post-Op  Subjective: Patient is a 44 year old female who underwent debridement of abdominal and right thigh wound; closure of right abdominal wound and the left abdominal wound; and application of meshed primatrix AG to the right thigh covered by Mepitel yesterday with Dr. Arita Miss.  Overnight patient became tachycardic and hypotensive and was moved to ICU.  Order was placed early this morning by critical care team for PRBC transfusion.  They plan to hold Lovenox and use SCDs.  This morning patient was sleeping in bed.  Easily arousable.  She denies headache, fever, chest pain, shortness of breath, nausea/vomiting.  Reports the pain in her abdominal wall and leg is feeling a little better.  Dressings are in place clean and dry.  Objective: Vital signs in last 24 hours: Temp:  [98.2 F (36.8 C)-101.5 F (38.6 C)] 98.2 F (36.8 C) (04/28 0750) Pulse Rate:  [100-141] 102 (04/28 0700) Resp:  [12-41] 31 (04/28 0700) BP: (62-142)/(29-106) 95/58 (04/28 0700) SpO2:  [94 %-100 %] 98 % (04/28 0700) Weight:  [97.8 kg] 97.8 kg (04/28 0420) Last BM Date: 07/09/19  Intake/Output from previous day: 04/27 0701 - 04/28 0700 In: 5130.6 [I.V.:2689.3; Blood:315; IV Piggyback:2116.3] Out: 705 [Urine:255; Blood:450] Intake/Output this shift: No intake/output data recorded.  General appearance: cooperative, fatigued, no distress and easily arousable and responsive. Head: Normocephalic, without obvious abnormality, atraumatic Eyes: EOMs intact Resp: nonlabored Skin: Abdominal/thigh wound dressing in place; clean and dry.  Lab Results:  @LABLAST2 (wbc:2,hgb:2,hct:2,plt:2) BMET Recent Labs    07/08/19 2152 07/09/19 0215  NA 136 138  K 3.9 3.6  CL 103 106  CO2 26 24  GLUCOSE 174* 121*  BUN 11 13  CREATININE 0.62 0.66  CALCIUM 6.8* 6.8*   PT/INR No results for input(s): LABPROT, INR in the last 72 hours. ABG No results for input(s): PHART, HCO3 in the last 72 hours.  Invalid input(s): PCO2,  PO2  Studies/Results: DG CHEST PORT 1 VIEW  Result Date: 07/08/2019 CLINICAL DATA:  Respiratory distress. EXAM: PORTABLE CHEST 1 VIEW COMPARISON:  June 25, 2019 FINDINGS: Decreased lung volumes are noted which is likely secondary to suboptimal patient inspiration. The endotracheal tube, nasogastric tube and right internal jugular venous catheter seen on the prior study have been removed. A left-sided PICC line is seen with its distal tip noted at the junction of the superior vena cava and right atrium. There is no evidence of acute infiltrate, pleural effusion or pneumothorax. A 9.9 cm long thin, linear radiopaque foreign body structure is seen overlying the mid to upper left lung and is not seen on the prior exam. The heart size and mediastinal contours are within normal limits. The visualized skeletal structures are unremarkable. IMPRESSION: No active disease. Electronically Signed   By: June 27, 2019 M.D.   On: 07/08/2019 22:35    Anti-infectives: Anti-infectives (From admission, onward)   Start     Dose/Rate Route Frequency Ordered Stop   07/09/19 0200  clindamycin (CLEOCIN) IVPB 900 mg     900 mg 100 mL/hr over 30 Minutes Intravenous Every 8 hours 07/09/19 0034     07/08/19 2245  vancomycin (VANCOCIN) IVPB 1000 mg/200 mL premix     1,000 mg 200 mL/hr over 60 Minutes Intravenous Every 12 hours 07/08/19 2231     07/08/19 2245  ceFEPIme (MAXIPIME) 2 g in sodium chloride 0.9 % 100 mL IVPB     2 g 200 mL/hr over 30 Minutes Intravenous Every 8 hours 07/08/19 2231     07/08/19 2215  ceFEPIme (MAXIPIME) 2 g in sodium chloride 0.9 % 100 mL IVPB  Status:  Discontinued     2 g 200 mL/hr over 30 Minutes Intravenous  Once 07/08/19 2210 07/09/19 0000   07/08/19 2215  metroNIDAZOLE (FLAGYL) IVPB 500 mg     500 mg 100 mL/hr over 60 Minutes Intravenous Every 8 hours 07/08/19 2210     07/08/19 2215  vancomycin (VANCOCIN) IVPB 1000 mg/200 mL premix  Status:  Discontinued     1,000 mg 200 mL/hr  over 60 Minutes Intravenous  Once 07/08/19 2210 07/09/19 0001   07/08/19 1115  ceFAZolin (ANCEF) IVPB 2g/100 mL premix     2 g 200 mL/hr over 30 Minutes Intravenous On call to O.R. 07/08/19 1104 07/08/19 1330   06/26/19 1200  Vancomycin (VANCOCIN) 1,500 mg in sodium chloride 0.9 % 500 mL IVPB  Status:  Discontinued     1,500 mg 250 mL/hr over 120 Minutes Intravenous Every 24 hours 06/25/19 1153 06/26/19 0455   06/26/19 1200  vancomycin (VANCOREADY) IVPB 1500 mg/300 mL  Status:  Discontinued     1,500 mg 150 mL/hr over 120 Minutes Intravenous Every 24 hours 06/26/19 0455 06/26/19 0730   06/26/19 0830  vancomycin (VANCOREADY) IVPB 1500 mg/300 mL  Status:  Discontinued     1,000 mg 100 mL/hr over 120 Minutes Intravenous Every 12 hours 06/26/19 0730 06/26/19 0741   06/26/19 0830  vancomycin (VANCOCIN) IVPB 1000 mg/200 mL premix  Status:  Discontinued     1,000 mg 200 mL/hr over 60 Minutes Intravenous Every 12 hours 06/26/19 0742 07/04/19 1238   06/25/19 1145  vancomycin (VANCOCIN) 500 mg in sodium chloride 0.9 % 100 mL IVPB     500 mg 100 mL/hr over 60 Minutes Intravenous NOW 06/25/19 1055 06/25/19 1241   06/24/19 1600  vancomycin (VANCOREADY) IVPB 750 mg/150 mL  Status:  Discontinued     750 mg 150 mL/hr over 60 Minutes Intravenous Every 12 hours 06/23/19 1700 06/24/19 0714   06/24/19 1600  vancomycin (VANCOCIN) IVPB 1000 mg/200 mL premix  Status:  Discontinued     1,000 mg 200 mL/hr over 60 Minutes Intravenous Every 12 hours 06/24/19 0714 06/24/19 0750   06/24/19 1400  clindamycin (CLEOCIN) IVPB 900 mg  Status:  Discontinued     900 mg 100 mL/hr over 30 Minutes Intravenous Every 8 hours 06/24/19 0756 06/29/19 1137   06/24/19 1145  ceFEPIme (MAXIPIME) 2 g in sodium chloride 0.9 % 100 mL IVPB  Status:  Discontinued     2 g 200 mL/hr over 30 Minutes Intravenous To ShortStay Surgical 06/24/19 1131 06/24/19 1457   06/24/19 1030  ceFEPIme (MAXIPIME) 2 g in sodium chloride 0.9 % 100 mL IVPB   Status:  Discontinued     2 g 200 mL/hr over 30 Minutes Intravenous Every 8 hours 06/24/19 0714 07/04/19 1238   06/24/19 0800  vancomycin (VANCOCIN) IVPB 1000 mg/200 mL premix  Status:  Discontinued     1,000 mg 200 mL/hr over 60 Minutes Intravenous Every 12 hours 06/24/19 0750 06/25/19 1153   06/24/19 0300  ceFEPIme (MAXIPIME) 2 g in sodium chloride 0.9 % 100 mL IVPB  Status:  Discontinued     2 g 200 mL/hr over 30 Minutes Intravenous Every 12 hours 06/23/19 1700 06/24/19 0714   06/23/19 1700  clindamycin (CLEOCIN) IVPB 600 mg  Status:  Discontinued     600 mg 100 mL/hr over 30 Minutes Intravenous Every 8 hours 06/23/19 1649 06/24/19 0756   06/23/19  1500  vancomycin (VANCOREADY) IVPB 2000 mg/400 mL     2,000 mg 200 mL/hr over 120 Minutes Intravenous  Once 06/23/19 1454 06/23/19 1747   06/23/19 1445  ceFEPIme (MAXIPIME) 2 g in sodium chloride 0.9 % 100 mL IVPB     2 g 200 mL/hr over 30 Minutes Intravenous  Once 06/23/19 1434 06/23/19 1557   06/23/19 1445  metroNIDAZOLE (FLAGYL) IVPB 500 mg     500 mg 100 mL/hr over 60 Minutes Intravenous  Once 06/23/19 1434 06/23/19 1608   06/23/19 1445  vancomycin (VANCOCIN) IVPB 1000 mg/200 mL premix  Status:  Discontinued     1,000 mg 200 mL/hr over 60 Minutes Intravenous  Once 06/23/19 1434 06/23/19 1440   06/23/19 1445  vancomycin (VANCOCIN) 2,000 mg in sodium chloride 0.9 % 500 mL IVPB  Status:  Discontinued     2,000 mg 250 mL/hr over 120 Minutes Intravenous  Once 06/23/19 1440 06/23/19 1454      Assessment/Plan: s/p Procedure(s): Debridement of abdominal and groin wound with placement of primatrix AG Will continue to monitor patient.  Plan to leave dressing in place until Friday. May replace ABDs and Tape as needed, but leave Kerlix and materials underneath undisturbed. Recommend high protein and low sugar intake for optimal wound healing.  LOS: 16 days    Threasa Heads, [place] 07/09/2019

## 2019-07-09 NOTE — TOC Initial Note (Signed)
Transition of Care [facility]) - Initial/Assessment Note    Patient Details  Name: Kimberly Orozco MRN: [id number] Date of Birth: 07-14-1975  Transition of Care [facility]) CM/SW Contact:    Terrial Rhodes, LCSWA Phone Number: 07/09/2019, 11:46 AM  Clinical Narrative:                   CSW spoke with patient at bedside. Patient was agreeable to SNF placement. Patient agreed for CSW to fax out initial referrals to [place] area. CSW also spoke with patients mother Kimberly Orozco who also is agreeable for patient to go to SNF. Patients mother wants CSW to call her with SNF choices when available.  Pending Bed Offers.   CSW will continue to follow.  Expected Discharge Plan: Skilled Nursing Facility Barriers to Discharge: Continued Medical Work up   Patient Goals and CMS Choice Patient states their goals for this hospitalization and ongoing recovery are:: to go to skilled nursing facility CMS Medicare.gov Compare Post Acute Care list provided to:: Patient Choice offered to / list presented to : Patient  Expected Discharge Plan and Services Expected Discharge Plan: Skilled Nursing Facility       Living arrangements for the past 2 months: Single Family Home                                      Prior Living Arrangements/Services Living arrangements for the past 2 months: Single Family Home   Patient language and need for interpreter reviewed:: Yes Do you feel safe going back to the place where you live?: No   to go to skilled nursing facility  Need for Family Participation in Patient Care: Yes (Comment) Care giver support system in place?: Yes (comment)   Criminal Activity/Legal Involvement Pertinent to Current Situation/Hospitalization: No - Comment as needed  Activities of Daily Living      Permission Sought/Granted Permission sought to share information with : Case Manager, Family Supports, Magazine features editor Permission granted to share information with : Yes,  Verbal Permission Granted  Share Information with NAME: Kimberly Orozco  Permission granted to share info w AGENCY: SNFs  Permission granted to share info w Relationship: Mother  Permission granted to share info w Contact Information: Kimberly Orozco [phone]  Emotional Assessment Appearance:: Appears stated age Attitude/Demeanor/Rapport: Gracious Affect (typically observed): Calm Orientation: : Oriented to Self, Oriented to Place, Oriented to  Time, Oriented to Situation Alcohol / Substance Use: Not Applicable Psych Involvement: No (comment)  Admission diagnosis:  DKA (diabetic ketoacidoses) (HCC) [E11.10] Diabetic ketoacidosis without coma associated with type 2 diabetes mellitus (HCC) [E11.10] Leukocytosis, unspecified type [D72.829] Cellulitis, unspecified cellulitis site [L03.90] Patient Active Problem List   Diagnosis Date Noted  . Septic shock (HCC)   . AKI (acute kidney injury) (HCC)   . Acute respiratory failure (HCC)   . Necrotizing cellulitis   . DKA (diabetic ketoacidoses) (HCC) 06/23/2019  . Diabetes mellitus type 2 in obese (HCC) 04/27/2019  . Bipolar disorder (HCC) 04/26/2019  . Schizophrenia (HCC) 04/25/2019   PCP:  Patient, No Pcp Per Pharmacy:   [facility] [phone] - [place], [place] - [phone] AT [facility] [address] & [address] [address] Phone: [phone] Fax: [phone]     Social Determinants of Health (SDOH) Interventions    Readmission Risk Interventions No flowsheet data found.

## 2019-07-09 NOTE — Progress Notes (Signed)
CRITICAL VALUE ALERT  Critical Value:  Hgb 6.1  Date & Time Notied:  07/09/19 at 0258  Provider Notified: Opyd,MD  Orders Received/Actions taken: MD notified. RN Mendi also notified. Order for 1 unit of blood placed by Opyd,MD.

## 2019-07-09 NOTE — Progress Notes (Addendum)
Nutrition Follow-up  DOCUMENTATION CODES:   Obesity unspecified  INTERVENTION:   Recommend placing a Cortrak tube for supplemental TF to help meet patient's nutrition needs. Pivot 1.5 at 50 ml/h would provide 1800 kcal (70% of estimated kcal needs), 113 gm protein (94% of estimated protein needs). Cortrak service is available M-W-F 8am-4pm.  Continue Boost Breeze po TID, each supplement provides 250 kcal and 9 grams of protein.  Continue Glucerna Shake po TID, each supplement provides 220 kcal and 10 grams of protein.  Continue Juven BID, each packet provides 80-95 calories, 8 grams of carbohydrate, 2.5  grams of protein (collagen), 7 grams of L-arginine and 7 grams of L-glutamine; supplement contains CaHMB, Vitamins C, E, B12 and Zinc to promote wound healing.  Continue Magic cup TID with meals, each supplement provides 290 kcal and 9 grams of protein  Continue Vital Cuisine Shake TID, each supplement provides 520 kcal and 22 grams of protein  Continue MVI, vitamin C, and zinc.  D/C Pro-stat, patient is not taking.    NUTRITION DIAGNOSIS:   Increased nutrient needs related to wound healing, acute illness as evidenced by estimated needs; ongoing  GOAL:   Patient will meet greater than or equal to 90% of their needs; progressing  MONITOR:   PO intake, Supplement acceptance, Labs, Weight trends, Skin, I & O's  ASSESSMENT:   44 yo female admitted with DKA, necrotizing soft tissue infection of lower abdominal wall and upper thigh extending to perineum, septic shock. PMH includes DM, bipolar disorder, schizophrenia.  S/P debridement of abdominal and right thigh wound; closure of right abdominal wound and left abdominal wound 4/27.  Patient transferred to the ICU early this AM after becoming febrile, tachycardic, and hypotensive.   Patient reports that she is not feeling well right now. RN in room to adjust bed and positioning. Patient wants to rest. She does not like the  Pro-stat supplements. RN also tried Nepro shake and patient did not like it. She also doesn't like Ensure, her mother drinks it. She has been drinking Glucerna Shakes, Juven, Boost [place], [address], and magic cup supplements. She does not like the hospital food.   Patient is eating very poorly. RN is encouraging intake. PO intake is not meeting increased nutrition needs to support wound healing. Would benefit from supplemental nutrition support.   Labs reviewed.  CBG's: [id number]  Medications reviewed and include vitamin C and zinc, novolog, Levemir, MVI with minerals, levophed.  Weight down drastically from 114.1 kg 4/27 to 97.8 kg today. Weight change may be related to different scale with change in bed when she was transferred to the ICU early this AM.    Diet Order:   Diet Order            Diet Carb Modified Fluid consistency: Thin; Room service appropriate? Yes  Diet effective now              EDUCATION NEEDS:   Not appropriate for education at this time  Skin:  Skin Assessment: Skin Integrity Issues: Skin Integrity Issues:: Unstageable, Other (Comment) Unstageable: N/A Other: Necrotizing wound to lower abdominal wall and upper thigh extending to perineum  Last BM:  4/28 type 6  Height:   Ht Readings from Last 1 Encounters:  06/23/19 5\' 7"  (1.702 m)    Weight:   Wt Readings from Last 1 Encounters:  07/09/19 97.8 kg    BMI:  Body mass index is 33.77 kg/m.  Estimated Nutritional Needs:   Kcal:  2400-2700  kcals  Protein:  120-150 g  Fluid:  >/= 2 L    Molli Barrows, RD, LDN, CNSC Please refer to [facility] for contact information.

## 2019-07-09 NOTE — Progress Notes (Signed)
Lower extremity venous has been completed.   Preliminary results in CV Proc.   Blanch Media 07/09/2019 2:52 PM

## 2019-07-09 NOTE — Progress Notes (Signed)
Dear Doctor: This patient has been identified as a candidate for PICC exchange for double lumen for the following reason (s): has had multiple PIV restarts and incompatible infusions. If you agree, please write an order for the indicated device.  Thank you for supporting the early vascular access assessment program.  Tomasita Morrow, RN VAST

## 2019-07-10 LAB — BASIC METABOLIC PANEL
Anion gap: 9 (ref 5–15)
BUN: 10 mg/dL (ref 6–20)
CO2: 24 mmol/L (ref 22–32)
Calcium: 7.3 mg/dL — ABNORMAL LOW (ref 8.9–10.3)
Chloride: 105 mmol/L (ref 98–111)
Creatinine, Ser: 0.61 mg/dL (ref 0.44–1.00)
GFR calc Af Amer: >60 mL/min (ref >60)
GFR calc non Af Amer: >60 mL/min (ref >60)
Glucose, Bld: 131 mg/dL — ABNORMAL HIGH (ref 70–99)
Potassium: 3.2 mmol/L — ABNORMAL LOW (ref 3.5–5.1)
Sodium: 138 mmol/L (ref 135–145)

## 2019-07-10 LAB — GLUCOSE, CAPILLARY
Glucose-Capillary: 119 mg/dL — ABNORMAL HIGH (ref 70–99)
Glucose-Capillary: 91 mg/dL (ref 70–99)
Glucose-Capillary: 89 mg/dL (ref 70–99)
Glucose-Capillary: 96 mg/dL (ref 70–99)

## 2019-07-10 LAB — TYPE AND SCREEN
ABO/RH(D): O POS
Antibody Screen: NEGATIVE
Unit division: 0
Unit division: 0

## 2019-07-10 LAB — HEPATIC FUNCTION PANEL
ALT: 8 U/L (ref 0–44)
AST: 11 U/L — ABNORMAL LOW (ref 15–41)
Albumin: 1.2 g/dL — ABNORMAL LOW (ref 3.5–5.0)
Alkaline Phosphatase: 93 U/L (ref 38–126)
Bilirubin, Direct: <0.1 mg/dL (ref 0.0–0.2)
Total Bilirubin: 0.4 mg/dL (ref 0.3–1.2)
Total Protein: 5 g/dL — ABNORMAL LOW (ref 6.5–8.1)

## 2019-07-10 LAB — BPAM RBC
Blood Product Expiration Date: 202104282359
Blood Product Expiration Date: 202105192359
ISSUE DATE / TIME: 202104251058
ISSUE DATE / TIME: 202104280329
Unit Type and Rh: 5100
Unit Type and Rh: 9500

## 2019-07-10 LAB — CBC
HCT: 26.2 % — ABNORMAL LOW (ref 36.0–46.0)
Hemoglobin: 8.2 g/dL — ABNORMAL LOW (ref 12.0–15.0)
MCH: 27.3 pg (ref 26.0–34.0)
MCHC: 31.3 g/dL (ref 30.0–36.0)
MCV: 87.3 fL (ref 80.0–100.0)
Platelets: 409 10*3/uL — ABNORMAL HIGH (ref 150–400)
RBC: 3 MIL/uL — ABNORMAL LOW (ref 3.87–5.11)
RDW: 16.1 % — ABNORMAL HIGH (ref 11.5–15.5)
WBC: 6.5 10*3/uL (ref 4.0–10.5)
nRBC: 0 % (ref 0.0–0.2)

## 2019-07-10 MED ORDER — PANTOPRAZOLE SODIUM 40 MG PO TBEC
40.0000 mg | DELAYED_RELEASE_TABLET | Freq: Every day | ORAL | Status: DC
  Administered 2019-07-10 – 2019-07-24 (×15): 40 mg via ORAL
  Filled 2019-07-10 (×15): qty 1

## 2019-07-10 MED ORDER — POTASSIUM CHLORIDE CRYS ER 20 MEQ PO TBCR
40.0000 meq | EXTENDED_RELEASE_TABLET | Freq: Once | ORAL | Status: AC
  Administered 2019-07-10: 40 meq via ORAL
  Filled 2019-07-10: qty 2

## 2019-07-10 MED ORDER — ONDANSETRON HCL 4 MG/2ML IJ SOLN
4.0000 mg | Freq: Three times a day (TID) | INTRAMUSCULAR | Status: DC | PRN
  Administered 2019-07-10: 4 mg via INTRAVENOUS
  Filled 2019-07-10: qty 2

## 2019-07-10 MED ORDER — ALBUMIN HUMAN 25 % IV SOLN
12.5000 g | Freq: Four times a day (QID) | INTRAVENOUS | Status: AC
  Administered 2019-07-10 – 2019-07-12 (×8): 12.5 g via INTRAVENOUS
  Filled 2019-07-10 (×8): qty 50

## 2019-07-10 NOTE — Progress Notes (Signed)
Physical Therapy Treatment Patient Details Name: Kimberly Orozco MRN: [id number] DOB: 1975/08/27 Today's Date: 07/10/2019    History of Present Illness 44 year old female admitted 06/23/19 with altered mental status and found to be in DKA with necrotizing soft tissue infection lower abdominal wall and upper RLE extending into perineum. Patient had extensive debridement by surgery 06/24/19 and developed shock requiring pressors. Repeat debridement 06/26/19, no longer requiring pressors and extubated 06/27/19. BKA resolved and patient transferred to hospitalist service 4/18, PCU 4/19. Plastic surgery consulted. Patient declining surgery at this time. PMH: schizophrenia, bipolar disorder, obesity and T2DM    PT Comments    Pt admitted with above diagnosis. Pt was able to ambulate with the RW in room with min guard to min assist.  Pt progressing and needs max encouragement at times to participate.  Pt currently with functional limitations due to balance and endurance deficits. Pt will benefit from skilled PT to increase their independence and safety with mobility to allow discharge to the venue listed below.     Follow Up Recommendations  SNF;CIR     Equipment Recommendations  Rolling walker with 5" wheels;3in1 (PT);Hospital bed;Other (comment)(TBD at next level of care)    Recommendations for Other Services       Precautions / Restrictions Precautions Precautions: Fall;Other (comment) Precaution Comments: L PICC, HOB>30 degrees catheter, abdominal wound Restrictions Weight Bearing Restrictions: No    Mobility  Bed Mobility Overal bed mobility: Needs Assistance Bed Mobility: Sit to Supine;Supine to Sit     Supine to sit: Min assist;+2 for physical assistance;HOB elevated Sit to supine: +2 for safety/equipment;Min assist   General bed mobility comments: Encouraged use of bedrail.  Needed assist for LES into and out of bed.   Transfers Overall transfer level: Needs  assistance Equipment used: Rolling walker (2 wheeled) Transfers: Sit to/from Stand Sit to Stand: Min assist;+2 safety/equipment         General transfer comment: Pt needed cues for hand placement.  Pt able to stand to RW wtih min assist and cues.  Once up, steady.   Ambulation/Gait Ambulation/Gait assistance: Min assist;Min guard;+2 safety/equipment Gait Distance (Feet): 36 Feet(18 feet x 2) Assistive device: Rolling walker (2 wheeled) Gait Pattern/deviations: Decreased step length - right;Decreased step length - left;Step-to pattern Gait velocity: decreased Gait velocity interpretation: <1.31 ft/sec, indicative of household ambulator General Gait Details: Gait training with RW and cues and assist for safe use of RW especially on turns and in small spaces. Patient ambulated to door and back to bed x 2 with a sitting rest break in between.  Patient limited by pain. Pt refused to sit in chair therefore returned back to bed.    Stairs             Wheelchair Mobility    Modified Rankin (Stroke Patients Only)       Balance Overall balance assessment: Needs assistance Sitting-balance support: Feet supported Sitting balance-Leahy Scale: Fair     Standing balance support: Bilateral upper extremity supported Standing balance-Leahy Scale: Fair Standing balance comment: Pt can stand statically without UE support briefly.  Pt dynamic stance with min guard assist to min assist.                             Cognition Arousal/Alertness: Awake/alert;Lethargic Behavior During Therapy: WFL for tasks assessed/performed Overall Cognitive Status: Within Functional Limits for tasks assessed  Exercises General Exercises - Lower Extremity Ankle Circles/Pumps: AROM;Both;10 reps;Supine Long Arc Quad: AROM;Both;10 reps;Seated    General Comments        Pertinent Vitals/Pain Pain Assessment: 0-10 Faces Pain Scale:  Hurts little more Pain Location: vagina, abdomen Pain Descriptors / Indicators: Pressure Pain Intervention(s): Limited activity within patient's tolerance;Monitored during session;Premedicated before session;Repositioned    Home Living                      Prior Function            PT Goals (current goals can now be found in the care plan section) Acute Rehab PT Goals Patient Stated Goal: to do things for herself Progress towards PT goals: Progressing toward goals    Frequency    Min 3X/week      PT Plan Current plan remains appropriate    Co-evaluation              AM-PAC PT "6 Clicks" Mobility   Outcome Measure  Help needed turning from your back to your side while in a flat bed without using bedrails?: A Lot Help needed moving from lying on your back to sitting on the side of a flat bed without using bedrails?: A Little Help needed moving to and from a bed to a chair (including a wheelchair)?: A Little Help needed standing up from a chair using your arms (e.g., wheelchair or bedside chair)?: A Little Help needed to walk in hospital room?: A Lot Help needed climbing 3-5 steps with a railing? : Total 6 Click Score: 14    End of Session Equipment Utilized During Treatment: Gait belt((up high so not on abdominal wound)) Activity Tolerance: Patient limited by fatigue Patient left: in bed;with call bell/phone within reach;with bed alarm set Nurse Communication: Mobility status PT Visit Diagnosis: Other abnormalities of gait and mobility (R26.89);Muscle weakness (generalized) (M62.81);Pain Pain - part of body: (abdomen)     Time: 9147-8295 PT Time Calculation (min) (ACUTE ONLY): 21 min  Charges:  $Gait Training: 8-22 mins                     Kimberly Orozco W,PT Acute Rehabilitation Services Pager:  [phone]  Office:  [phone]     Berline Lopes 07/10/2019, 11:56 AM

## 2019-07-10 NOTE — Care Management Important Message (Signed)
Important Message  Patient Details  Name: Kimberly Orozco MRN: [id number] Date of Birth: [birth year]   Medicare Important Message Given:  Yes     Renie Ora 07/10/2019, 10:28 AM

## 2019-07-10 NOTE — Progress Notes (Signed)
Pt foley cath leaking, received order to flush. Difficulty flushing initially, was able to flush and get urine return. Pt has had mult large semi soft/liquid stools today. Had to change dressing on right thigh and abd many times. Clear dressing placed by md never removed. Irrigated with sterile water to irrigate wound. Cont to monitor. Emelda Brothers RN

## 2019-07-10 NOTE — Progress Notes (Signed)
eLink Physician-Brief Progress Note Patient Name: Kimberly Orozco DOB: March 27, 1975 MRN: [id number]   Date of Service  07/10/2019  HPI/Events of Note  Nausea - QTc interval = 0.42 seconds.   eICU Interventions  Will order: 1. Zofran 4 mg IV Q 8 hours PRN N/V     Intervention Category Major Interventions: Other:  Lenell Antu 07/10/2019, 12:24 AM

## 2019-07-10 NOTE — Progress Notes (Signed)
PROGRESS NOTE    Kimberly Orozco  FMB:[id number] DOB: 10-24-1975 DOA: 06/23/2019 PCP: Patient, No Pcp Per   Brief Narrative:  44 year old with history of DM2, schizophrenia, bipolar disorder admitted with DKA in the setting of necrotizing fasciitis of the right groin, left inguinal crease and right medial thigh.  She underwent debridement on 4/13 and then again on 4/15.  Initially intubated on 4/12, thereafter extubated on 4/16 and transferred out of the ICU on 4/17.  Thoracic surgery team was consulted, another debridement was performed on 4/27 with estimated blood loss 450 cc.  On 4/28 patient again became febrile, hypotensive oligoanuric requiring transfer to the ICU.  Has been receiving multiple course of antibiotics.   Assessment & Plan:   Active Problems:   DKA (diabetic ketoacidoses) (HCC)   Necrotizing cellulitis   Septic shock (HCC)   AKI (acute kidney injury) (HCC)   Acute respiratory failure (HCC)   Septic shock secondary to necrotizing fasciitis of abdominal wound, perineum and the right thigh -Shock physiology has improved. -Continue broad-spectrum antibiotics-cefepime, clindamycin, vancomycin -Briefly discussed with ID, will have them officially see the patient tomorrow to adjust further antibiotics. -Follow culture data -Seen by plastic surgery-recommending leaving dressing in place until Friday then replace.  High-protein low sugar diet. -Vitamin C, zinc, supplemental boost  Patient has Foley catheter in place to keep her lower abdominal and thigh wounds clean.  Oligoanuric Anasarca/hypoalbuminemia -Secondary to intravascular volume depletion.  Getting IV fluids.  Monitor urine output. -IV albumin  Acute blood loss anemia -Requiring frequent transfusions 3 U so far (4/18; 4/25; 4/28).  Maintain hemoglobin greater than 7.  Diabetes mellitus type 2 poorly controlled secondary to hyperglycemia -Sliding scale -Levemir 5 units daily  History of schizophrenia with  bipolar disorder -Continue Haldol, valproate and Cogentin  Obesity with BMI greater than 35 -Weight loss diet and exercise    DVT prophylaxis: SCDs Code Status: Full code Family Communication:    Status is: Inpatient  Remains inpatient appropriate because:Hemodynamically unstable   Dispo: The patient is from: Home              Anticipated d/c is to: CIR vs SNF              Anticipated d/c date is: > 3 days              Patient currently is not medically stable to d/c.  Subjective: Reporting of lower abdominal pain at the surgical site but overall better.  She is ambulating in the hallway with the assistance of physical therapy  Review of Systems Otherwise negative except as per HPI, including: General: Denies fever, chills, night sweats or unintended weight loss. Resp: Denies cough, wheezing, shortness of breath. Cardiac: Denies chest pain, palpitations, orthopnea, paroxysmal nocturnal dyspnea. GI: Denies abdominal pain, nausea, vomiting, diarrhea or constipation GU: Denies dysuria, frequency, hesitancy or incontinence MS: Denies muscle aches, joint pain or swelling Neuro: Denies headache, neurologic deficits (focal weakness, numbness, tingling), abnormal gait Psych: Denies anxiety, depression, SI/HI/AVH Skin: Denies new rashes or lesions ID: Denies sick contacts, exotic exposures, travel  Examination:  General exam: Appears chronically ill Respiratory system: Clear to auscultation. Respiratory effort normal. Cardiovascular system: Slight tachycardia, S1 & S2 heard, RRR. No JVD, murmurs, rubs, gallops or clicks. No pedal edema. Gastrointestinal system: Abdomen is nondistended, soft and nontender. No organomegaly or masses felt. Normal bowel sounds heard. Central nervous system: Alert and oriented. No focal neurological deficits. Extremities: Symmetric 5 x 5 power. Skin: Lower abdominal and  bilateral groin dressing noted Psychiatry: Judgement and insight appear normal.  Mood & affect appropriate.   PICC line in place Foley catheter in place  Objective: Vitals:   07/10/19 0016 07/10/19 0535 07/10/19 0549 07/10/19 0550  BP: 124/82 125/77  125/77  Pulse: (!) 117 (!) 118  (!) 113  Resp: (!) 25 (!) 36  (!) 23  Temp: 99.3 F (37.4 C)  99.6 F (37.6 C) 99.6 F (37.6 C)  TempSrc: Oral  Oral Oral  SpO2: 99% 99%  98%  Weight: 122.1 kg     Height: 5\' 6"  (1.676 m)       Intake/Output Summary (Last 24 hours) at 07/10/2019 0808 Last data filed at 07/09/2019 2300 Gross per 24 hour  Intake 1481.06 ml  Output 1660 ml  Net -178.94 ml   Filed Weights   07/08/19 0414 07/09/19 0420 07/10/19 0016  Weight: 114.1 kg 97.8 kg 122.1 kg     Data Reviewed:   CBC: Recent Labs  Lab 07/07/19 0406 07/07/19 0406 07/08/19 0413 07/08/19 2152 07/09/19 0215 07/09/19 0738 07/10/19 0544  WBC 7.5  --  7.1 7.7 7.6  --  6.5  HGB 8.6*   < > 8.6* 7.7* 6.1* 8.3* 8.2*  HCT 28.1*   < > 28.5* 25.5* 20.7* 26.5* 26.2*  MCV 89.2  --  89.6 91.1 90.4  --  87.3  PLT 548*  --  527* 481* 397  --  409*   < > = values in this interval not displayed.   Basic Metabolic Panel: Recent Labs  Lab 07/07/19 0406 07/08/19 0413 07/08/19 2152 07/09/19 0215 07/10/19 0544  NA 140 137 136 138 138  K 3.7 3.6 3.9 3.6 3.2*  CL 104 103 103 106 105  CO2 28 28 26 24 24   GLUCOSE 99 107* 174* 121* 131*  BUN <5* 10 11 13 10   CREATININE 0.50 0.45 0.62 0.66 0.61  CALCIUM 7.5* 7.5* 6.8* 6.8* 7.3*   GFR: Estimated Creatinine Clearance: 119.6 mL/min (by C-G formula based on SCr of 0.61 mg/dL). Liver Function Tests: Recent Labs  Lab 07/07/19 0406  ALBUMIN <1.0*   No results for input(s): LIPASE, AMYLASE in the last 168 hours. No results for input(s): AMMONIA in the last 168 hours. Coagulation Profile: No results for input(s): INR, PROTIME in the last 168 hours. Cardiac Enzymes: No results for input(s): CKTOTAL, CKMB, CKMBINDEX, TROPONINI in the last 168 hours. BNP (last 3 results) No  results for input(s): PROBNP in the last 8760 hours. HbA1C: No results for input(s): HGBA1C in the last 72 hours. CBG: Recent Labs  Lab 07/09/19 0418 07/09/19 0749 07/09/19 1125 07/09/19 1738 07/09/19 2256  GLUCAP 117* 130* 134* 176* 107*   Lipid Profile: No results for input(s): CHOL, HDL, LDLCALC, TRIG, CHOLHDL, LDLDIRECT in the last 72 hours. Thyroid Function Tests: No results for input(s): TSH, T4TOTAL, FREET4, T3FREE, THYROIDAB in the last 72 hours. Anemia Panel: No results for input(s): VITAMINB12, FOLATE, FERRITIN, TIBC, IRON, RETICCTPCT in the last 72 hours. Sepsis Labs: Recent Labs  Lab 07/08/19 2220 07/09/19 0215  LATICACIDVEN 2.7* 2.4*    Recent Results (from the past 240 hour(s))  Culture, blood (x 2)     Status: None (Preliminary result)   Collection Time: 07/08/19 10:42 PM   Specimen: BLOOD  Result Value Ref Range Status   Specimen Description BLOOD RIGHT ARM  Final   Special Requests   Final    BOTTLES DRAWN AEROBIC AND ANAEROBIC Blood Culture results may not be optimal  due to an excessive volume of blood received in culture bottles   Culture   Final    NO GROWTH < 24 HOURS Performed at [facility], [address] [place], [place] [address]    Report Status PENDING  Incomplete  Culture, blood (x 2)     Status: None (Preliminary result)   Collection Time: 07/08/19 10:50 PM   Specimen: BLOOD  Result Value Ref Range Status   Specimen Description BLOOD RIGHT HAND  Final   Special Requests   Final    BOTTLES DRAWN AEROBIC ONLY Blood Culture results may not be optimal due to an excessive volume of blood received in culture bottles   Culture   Final    NO GROWTH < 24 HOURS Performed at [facility], [address] [place], [place] [address]    Report Status PENDING  Incomplete         Radiology Studies: DG CHEST PORT 1 VIEW  Result Date: 07/08/2019 CLINICAL DATA:  Respiratory distress. EXAM: PORTABLE CHEST 1 VIEW COMPARISON:  June 25, 2019 FINDINGS: Decreased lung volumes are noted which is likely secondary to suboptimal patient inspiration. The endotracheal tube, nasogastric tube and right internal jugular venous catheter seen on the prior study have been removed. A left-sided PICC line is seen with its distal tip noted at the junction of the superior vena cava and right atrium. There is no evidence of acute infiltrate, pleural effusion or pneumothorax. A 9.9 cm long thin, linear radiopaque foreign body structure is seen overlying the mid to upper left lung and is not seen on the prior exam. The heart size and mediastinal contours are within normal limits. The visualized skeletal structures are unremarkable. IMPRESSION: No active disease. Electronically Signed   By: Aram Candelahaddeus  Houston M.D.   On: 07/08/2019 22:35   VAS US LOWER EXTREMITY VENOUS (DVT)  Result Date: 07/09/2019  Lower Venous DVTStudy Indications: Pain.  Limitations: Bilateral bandaging. Comparison Study: no prior Performing Technologist: Blanch MediaMegan Riddle RVS  Examination Guidelines: A complete evaluation includes B-mode imaging, spectral Doppler, color Doppler, and power Doppler as needed of all accessible portions of each vessel. Bilateral testing is considered an integral part of a complete examination. Limited examinations for reoccurring indications may be performed as noted. The reflux portion of the exam is performed with the patient in reverse Trendelenburg.  +---------+---------------+---------+-----------+----------+--------------+ RIGHT    CompressibilityPhasicitySpontaneityPropertiesThrombus Aging +---------+---------------+---------+-----------+----------+--------------+ CFV                                                   Not visualized +---------+---------------+---------+-----------+----------+--------------+ SFJ                                                   Not visualized  +---------+---------------+---------+-----------+----------+--------------+ FV Prox                                               Not visualized +---------+---------------+---------+-----------+----------+--------------+ FV Mid  Not visualized +---------+---------------+---------+-----------+----------+--------------+ FV Distal                                             Not visualized +---------+---------------+---------+-----------+----------+--------------+ PFV                                                   Not visualized +---------+---------------+---------+-----------+----------+--------------+ POP      Full           Yes      Yes                                 +---------+---------------+---------+-----------+----------+--------------+ PTV      Full                                                        +---------+---------------+---------+-----------+----------+--------------+ PERO     Full                                                        +---------+---------------+---------+-----------+----------+--------------+   +---------+---------------+---------+-----------+----------+--------------+ LEFT     CompressibilityPhasicitySpontaneityPropertiesThrombus Aging +---------+---------------+---------+-----------+----------+--------------+ CFV                                                   Not visualized +---------+---------------+---------+-----------+----------+--------------+ SFJ                                                   Not visualized +---------+---------------+---------+-----------+----------+--------------+ FV Prox                                               Not visualized +---------+---------------+---------+-----------+----------+--------------+ FV Mid                  Yes      Yes                                  +---------+---------------+---------+-----------+----------+--------------+ FV Distal               Yes      Yes                                 +---------+---------------+---------+-----------+----------+--------------+ PFV  Not visualized +---------+---------------+---------+-----------+----------+--------------+ POP      Full           Yes      Yes                                 +---------+---------------+---------+-----------+----------+--------------+ PTV      Full                                                        +---------+---------------+---------+-----------+----------+--------------+ PERO     Full                                                        +---------+---------------+---------+-----------+----------+--------------+     Summary: RIGHT: - There is no evidence of deep vein thrombosis in the lower extremity. However, portions of this examination were limited- see technologist comments above.  - No cystic structure found in the popliteal fossa.  LEFT: - There is no evidence of deep vein thrombosis in the lower extremity. However, portions of this examination were limited- see technologist comments above.  - No cystic structure found in the popliteal fossa.  *See table(s) above for measurements and observations. Electronically signed by Lemar Livings MD on 07/09/2019 at 8:19:35 PM.    Final    [place] EKG Site Rite  Result Date: 07/09/2019 If Site Rite image not attached, placement could not be confirmed due to current cardiac rhythm.       Scheduled Meds: . sodium chloride   Intravenous Once  . acetaminophen  1,000 mg Oral Q6H  . vitamin C  500 mg Oral BID  . benztropine  1 mg Oral BID  . chlorhexidine  15 mL Mouth Rinse BID  . Chlorhexidine Gluconate Cloth  6 each Topical Daily  . feeding supplement  1 Container Oral TID BM  . feeding supplement (GLUCERNA SHAKE)  237 mL Oral TID BM  . Gerhardt's  butt cream   Topical BID  . haloperidol  10 mg Oral q morning - 10a  . haloperidol  20 mg Oral QHS  . insulin aspart  0-5 Units Subcutaneous QHS  . insulin aspart  0-9 Units Subcutaneous TID WC  . insulin detemir  5 Units Subcutaneous Daily  . mouth rinse  15 mL Mouth Rinse q12n4p  . multivitamin with minerals  1 tablet Oral Daily  . nutrition supplement (JUVEN)  1 packet Oral BID BM  . pantoprazole sodium  40 mg Oral Daily  . sodium chloride flush  10-40 mL Intracatheter Q12H  . sodium chloride flush  10-40 mL Intracatheter Q12H  . valproic acid  250 mg Oral BID  . zinc sulfate  220 mg Oral Daily   Continuous Infusions: . sodium chloride Stopped (07/09/19 2238)  . ceFEPime (MAXIPIME) IV 2 g (07/10/19 0554)  . clindamycin (CLEOCIN) IV 900 mg (07/10/19 0205)  . lactated ringers 125 mL/hr at 07/10/19 0648  . norepinephrine (LEVOPHED) Adult infusion Stopped (07/09/19 0900)  . vancomycin 200 mL/hr at 07/09/19 2300     LOS: 17 days   Time spent= 35 mins    Emonte Dieujuste Chirag Staceyann Knouff,  MD [facility]  If 7PM-7AM, please contact night-coverage  07/10/2019, 8:08 AM

## 2019-07-11 ENCOUNTER — Encounter (HOSPITAL_COMMUNITY): Payer: Self-pay | Admitting: Internal Medicine

## 2019-07-11 DIAGNOSIS — E11628 Type 2 diabetes mellitus with other skin complications: Secondary | ICD-10-CM

## 2019-07-11 DIAGNOSIS — D72829 Elevated white blood cell count, unspecified: Secondary | ICD-10-CM

## 2019-07-11 LAB — CBC
HCT: 24.5 % — ABNORMAL LOW (ref 36.0–46.0)
Hemoglobin: 7.6 g/dL — ABNORMAL LOW (ref 12.0–15.0)
MCH: 27.6 pg (ref 26.0–34.0)
MCHC: 31 g/dL (ref 30.0–36.0)
MCV: 89.1 fL (ref 80.0–100.0)
Platelets: 410 10*3/uL — ABNORMAL HIGH (ref 150–400)
RBC: 2.75 MIL/uL — ABNORMAL LOW (ref 3.87–5.11)
RDW: 16.2 % — ABNORMAL HIGH (ref 11.5–15.5)
WBC: 6 10*3/uL (ref 4.0–10.5)
nRBC: 0 % (ref 0.0–0.2)

## 2019-07-11 LAB — BASIC METABOLIC PANEL
Anion gap: 6 (ref 5–15)
BUN: 9 mg/dL (ref 6–20)
CO2: 26 mmol/L (ref 22–32)
Calcium: 7.4 mg/dL — ABNORMAL LOW (ref 8.9–10.3)
Chloride: 107 mmol/L (ref 98–111)
Creatinine, Ser: 0.55 mg/dL (ref 0.44–1.00)
GFR calc Af Amer: >60 mL/min (ref >60)
GFR calc non Af Amer: >60 mL/min (ref >60)
Glucose, Bld: 94 mg/dL (ref 70–99)
Potassium: 3.3 mmol/L — ABNORMAL LOW (ref 3.5–5.1)
Sodium: 139 mmol/L (ref 135–145)

## 2019-07-11 LAB — BRAIN NATRIURETIC PEPTIDE: B Natriuretic Peptide: 144.8 pg/mL — ABNORMAL HIGH (ref 0.0–100.0)

## 2019-07-11 LAB — GLUCOSE, CAPILLARY
Glucose-Capillary: 85 mg/dL (ref 70–99)
Glucose-Capillary: 108 mg/dL — ABNORMAL HIGH (ref 70–99)
Glucose-Capillary: 111 mg/dL — ABNORMAL HIGH (ref 70–99)
Glucose-Capillary: 101 mg/dL — ABNORMAL HIGH (ref 70–99)

## 2019-07-11 LAB — MAGNESIUM: Magnesium: 1.4 mg/dL — ABNORMAL LOW (ref 1.7–2.4)

## 2019-07-11 MED ORDER — MAGNESIUM OXIDE 400 (241.3 MG) MG PO TABS: 800.0000 mg | ORAL_TABLET | Freq: Once | ORAL | Status: DC

## 2019-07-11 MED ORDER — MAGNESIUM SULFATE 4 GM/100ML IV SOLN
4.0000 g | Freq: Once | INTRAVENOUS | Status: AC
  Administered 2019-07-11: 4 g via INTRAVENOUS
  Filled 2019-07-11: qty 100

## 2019-07-11 MED ORDER — POTASSIUM CHLORIDE CRYS ER 20 MEQ PO TBCR: 40.0000 meq | EXTENDED_RELEASE_TABLET | Freq: Once | ORAL | Status: DC

## 2019-07-11 MED ORDER — POTASSIUM CHLORIDE 10 MEQ/100ML IV SOLN
10.0000 meq | INTRAVENOUS | Status: AC
  Administered 2019-07-11 (×5): 10 meq via INTRAVENOUS
  Filled 2019-07-11 (×3): qty 100

## 2019-07-11 NOTE — Progress Notes (Signed)
3 Days Post-Op  Subjective: Patient is a 44 year old female who underwent debridement of abdominal and right thigh wound, closure of right abdominal wound and the left abdominal wound, and application of meshed Primatrix AG to the right thigh covered by Mepitel on 07/08/2019 with Dr. Arita MissPace.  Today patient is sitting in bed taking medication with her RN. Denies F, CP, SOB, N/V. Reports some pain of wounds. RN reports they have had to change dressings a few times due to urine leakage and bowel movements. She will speak with Hospitalist about potential solutions. Dressing removed for examination. Right and left abdominal incisions are intact. No signs of infection, redness, seroma/hematoma. Left drain is in place. Right drain is no longer in place. Primatrix AG and Mepitel are in place.  Dressing changed.  Objective: Vital signs in last 24 hours: Temp:  [98 F (36.7 C)-100.7 F (38.2 C)] 98.2 F (36.8 C) (04/30 0701) Pulse Rate:  [97-112] 97 (04/30 0701) Resp:  [20-26] 26 (04/30 0701) BP: (106-128)/(63-82) 108/75 (04/30 0701) SpO2:  [97 %-99 %] 98 % (04/30 0701) Weight:  [120.6 kg] 120.6 kg (04/30 0701) Last BM Date: 07/09/19  Intake/Output from previous day: 04/29 0701 - 04/30 0700 In: 1384.5 [P.O.:240; I.V.:114.3; IV Piggyback:1030.2] Out: 100 [Urine:100] Intake/Output this shift: No intake/output data recorded.  General appearance: alert, cooperative and no distress Head: Normocephalic, without obvious abnormality, atraumatic Eyes: EOMs intact Resp: nonlabored Incision/Wound: Right and left abdominal incisions are intact. Left drain in place. Rigt drain no longer in place. Primatrix and Mepitel are in place on right thigh. No signs of infection, seroma/hematoma.  Right Thigh     Right abdominal incision   Left Abdominal incision     Lab Results:   BMET Recent Labs    07/10/19 0544 07/11/19 0500  NA 138 139  K 3.2* 3.3*  CL 105 107  CO2 24 26  GLUCOSE 131* 94    BUN 10 9  CREATININE 0.61 0.55  CALCIUM 7.3* 7.4*   PT/INR No results for input(s): LABPROT, INR in the last 72 hours. ABG No results for input(s): PHART, HCO3 in the last 72 hours.  Invalid input(s): PCO2, PO2  Studies/Results: VAS US LOWER EXTREMITY VENOUS (DVT)  Result Date: 07/09/2019  Lower Venous DVTStudy Indications: Pain.  Limitations: Bilateral bandaging. Comparison Study: no prior Performing Technologist: Blanch MediaMegan Riddle RVS  Examination Guidelines: A complete evaluation includes B-mode imaging, spectral Doppler, color Doppler, and power Doppler as needed of all accessible portions of each vessel. Bilateral testing is considered an integral part of a complete examination. Limited examinations for reoccurring indications may be performed as noted. The reflux portion of the exam is performed with the patient in reverse Trendelenburg.  +---------+---------------+---------+-----------+----------+--------------+ RIGHT    CompressibilityPhasicitySpontaneityPropertiesThrombus Aging +---------+---------------+---------+-----------+----------+--------------+ CFV                                                   Not visualized +---------+---------------+---------+-----------+----------+--------------+ SFJ                                                   Not visualized +---------+---------------+---------+-----------+----------+--------------+ FV Prox  Not visualized +---------+---------------+---------+-----------+----------+--------------+ FV Mid                                                Not visualized +---------+---------------+---------+-----------+----------+--------------+ FV Distal                                             Not visualized +---------+---------------+---------+-----------+----------+--------------+ PFV                                                   Not visualized  +---------+---------------+---------+-----------+----------+--------------+ POP      Full           Yes      Yes                                 +---------+---------------+---------+-----------+----------+--------------+ PTV      Full                                                        +---------+---------------+---------+-----------+----------+--------------+ PERO     Full                                                        +---------+---------------+---------+-----------+----------+--------------+   +---------+---------------+---------+-----------+----------+--------------+ LEFT     CompressibilityPhasicitySpontaneityPropertiesThrombus Aging +---------+---------------+---------+-----------+----------+--------------+ CFV                                                   Not visualized +---------+---------------+---------+-----------+----------+--------------+ SFJ                                                   Not visualized +---------+---------------+---------+-----------+----------+--------------+ FV Prox                                               Not visualized +---------+---------------+---------+-----------+----------+--------------+ FV Mid                  Yes      Yes                                 +---------+---------------+---------+-----------+----------+--------------+ FV Distal               Yes      Yes                                 +---------+---------------+---------+-----------+----------+--------------+  PFV                                                   Not visualized +---------+---------------+---------+-----------+----------+--------------+ POP      Full           Yes      Yes                                 +---------+---------------+---------+-----------+----------+--------------+ PTV      Full                                                         +---------+---------------+---------+-----------+----------+--------------+ PERO     Full                                                        +---------+---------------+---------+-----------+----------+--------------+     Summary: RIGHT: - There is no evidence of deep vein thrombosis in the lower extremity. However, portions of this examination were limited- see technologist comments above.  - No cystic structure found in the popliteal fossa.  LEFT: - There is no evidence of deep vein thrombosis in the lower extremity. However, portions of this examination were limited- see technologist comments above.  - No cystic structure found in the popliteal fossa.  *See table(s) above for measurements and observations. Electronically signed by Servando Snare MD on 07/09/2019 at 8:19:35 PM.    Final    [place] EKG Site Rite  Result Date: 07/09/2019 If Site Rite image not attached, placement could not be confirmed due to current cardiac rhythm.   Anti-infectives: Anti-infectives (From admission, onward)   Start     Dose/Rate Route Frequency Ordered Stop   07/09/19 0200  clindamycin (CLEOCIN) IVPB 900 mg  Status:  Discontinued     900 mg 100 mL/hr over 30 Minutes Intravenous Every 8 hours 07/09/19 0034 07/11/19 0812   07/08/19 2245  vancomycin (VANCOCIN) IVPB 1000 mg/200 mL premix     1,000 mg 200 mL/hr over 60 Minutes Intravenous Every 12 hours 07/08/19 2231     07/08/19 2245  ceFEPIme (MAXIPIME) 2 g in sodium chloride 0.9 % 100 mL IVPB     2 g 200 mL/hr over 30 Minutes Intravenous Every 8 hours 07/08/19 2231     07/08/19 2215  ceFEPIme (MAXIPIME) 2 g in sodium chloride 0.9 % 100 mL IVPB  Status:  Discontinued     2 g 200 mL/hr over 30 Minutes Intravenous  Once 07/08/19 2210 07/09/19 0000   07/08/19 2215  metroNIDAZOLE (FLAGYL) IVPB 500 mg  Status:  Discontinued     500 mg 100 mL/hr over 60 Minutes Intravenous Every 8 hours 07/08/19 2210 07/09/19 0846   07/08/19 2215  vancomycin (VANCOCIN) IVPB 1000  mg/200 mL premix  Status:  Discontinued     1,000 mg 200 mL/hr over 60 Minutes Intravenous  Once 07/08/19 2210 07/09/19 0001   07/08/19 1115  ceFAZolin (ANCEF) IVPB 2g/100 mL premix  2 g 200 mL/hr over 30 Minutes Intravenous On call to O.R. 07/08/19 1104 07/08/19 1330   06/26/19 1200  Vancomycin (VANCOCIN) 1,500 mg in sodium chloride 0.9 % 500 mL IVPB  Status:  Discontinued     1,500 mg 250 mL/hr over 120 Minutes Intravenous Every 24 hours 06/25/19 1153 06/26/19 0455   06/26/19 1200  vancomycin (VANCOREADY) IVPB 1500 mg/300 mL  Status:  Discontinued     1,500 mg 150 mL/hr over 120 Minutes Intravenous Every 24 hours 06/26/19 0455 06/26/19 0730   06/26/19 0830  vancomycin (VANCOREADY) IVPB 1500 mg/300 mL  Status:  Discontinued     1,000 mg 100 mL/hr over 120 Minutes Intravenous Every 12 hours 06/26/19 0730 06/26/19 0741   06/26/19 0830  vancomycin (VANCOCIN) IVPB 1000 mg/200 mL premix  Status:  Discontinued     1,000 mg 200 mL/hr over 60 Minutes Intravenous Every 12 hours 06/26/19 0742 07/04/19 1238   06/25/19 1145  vancomycin (VANCOCIN) 500 mg in sodium chloride 0.9 % 100 mL IVPB     500 mg 100 mL/hr over 60 Minutes Intravenous NOW 06/25/19 1055 06/25/19 1241   06/24/19 1600  vancomycin (VANCOREADY) IVPB 750 mg/150 mL  Status:  Discontinued     750 mg 150 mL/hr over 60 Minutes Intravenous Every 12 hours 06/23/19 1700 06/24/19 0714   06/24/19 1600  vancomycin (VANCOCIN) IVPB 1000 mg/200 mL premix  Status:  Discontinued     1,000 mg 200 mL/hr over 60 Minutes Intravenous Every 12 hours 06/24/19 0714 06/24/19 0750   06/24/19 1400  clindamycin (CLEOCIN) IVPB 900 mg  Status:  Discontinued     900 mg 100 mL/hr over 30 Minutes Intravenous Every 8 hours 06/24/19 0756 06/29/19 1137   06/24/19 1145  ceFEPIme (MAXIPIME) 2 g in sodium chloride 0.9 % 100 mL IVPB  Status:  Discontinued     2 g 200 mL/hr over 30 Minutes Intravenous To [facility]Stay [facility] 06/24/19 1131 06/24/19 1457   06/24/19 1030   ceFEPIme (MAXIPIME) 2 g in sodium chloride 0.9 % 100 mL IVPB  Status:  Discontinued     2 g 200 mL/hr over 30 Minutes Intravenous Every 8 hours 06/24/19 0714 07/04/19 1238   06/24/19 0800  vancomycin (VANCOCIN) IVPB 1000 mg/200 mL premix  Status:  Discontinued     1,000 mg 200 mL/hr over 60 Minutes Intravenous Every 12 hours 06/24/19 0750 06/25/19 1153   06/24/19 0300  ceFEPIme (MAXIPIME) 2 g in sodium chloride 0.9 % 100 mL IVPB  Status:  Discontinued     2 g 200 mL/hr over 30 Minutes Intravenous Every 12 hours 06/23/19 1700 06/24/19 0714   06/23/19 1700  clindamycin (CLEOCIN) IVPB 600 mg  Status:  Discontinued     600 mg 100 mL/hr over 30 Minutes Intravenous Every 8 hours 06/23/19 1649 06/24/19 0756   06/23/19 1500  vancomycin (VANCOREADY) IVPB 2000 mg/400 mL     2,000 mg 200 mL/hr over 120 Minutes Intravenous  Once 06/23/19 1454 06/23/19 1747   06/23/19 1445  ceFEPIme (MAXIPIME) 2 g in sodium chloride 0.9 % 100 mL IVPB     2 g 200 mL/hr over 30 Minutes Intravenous  Once 06/23/19 1434 06/23/19 1557   06/23/19 1445  metroNIDAZOLE (FLAGYL) IVPB 500 mg     500 mg 100 mL/hr over 60 Minutes Intravenous  Once 06/23/19 1434 06/23/19 1608   06/23/19 1445  vancomycin (VANCOCIN) IVPB 1000 mg/200 mL premix  Status:  Discontinued     1,000 mg 200 mL/hr  over 60 Minutes Intravenous  Once 06/23/19 1434 06/23/19 1440   06/23/19 1445  vancomycin (VANCOCIN) 2,000 mg in sodium chloride 0.9 % 500 mL IVPB  Status:  Discontinued     2,000 mg 250 mL/hr over 120 Minutes Intravenous  Once 06/23/19 1440 06/23/19 1454      Assessment/Plan: s/p Procedure(s): Debridement of abdominal and groin wound with placement of primatrix AG  Do daily dressing changes. Right thigh:  thin layer of Surgical Lube to cover mepitel mesh, cover with kerlix or other absorbant gauze and ABS, secure with tape. Right and Left Abdominal incisions: Cover with Xeroform and ABD, secure with Tape.  May change dressings more often as  needed. Mepitel on right thigh should be moist, but not soaking wet.    LOS: 18 days    Eldridge Abrahams, PA-C 07/11/2019

## 2019-07-11 NOTE — Progress Notes (Signed)
PROGRESS NOTE    Kimberly Orozco  PYP:[id number] DOB: March 03, 1976 DOA: 06/23/2019 PCP: Patient, No Pcp Per   Brief Narrative:  44 year old with history of DM2, schizophrenia, bipolar disorder admitted with DKA in the setting of necrotizing fasciitis of the right groin, left inguinal crease and right medial thigh.  She underwent debridement on 4/13 and then again on 4/15.  Initially intubated on 4/12, thereafter extubated on 4/16 and transferred out of the ICU on 4/17.  Thoracic surgery team was consulted, another debridement was performed on 4/27 with estimated blood loss 450 cc.  On 4/28 patient again became febrile, hypotensive oligoanuric requiring transfer to the ICU.  Has been receiving multiple course of antibiotics.   Assessment & Plan:   Active Problems:   DKA (diabetic ketoacidoses) (HCC)   Necrotizing cellulitis   Septic shock ([place])   AKI (acute kidney injury) ([place])   Acute respiratory failure (HCC)   Septic shock secondary to necrotizing fasciitis of abdominal wound, perineum and the right thigh -Shock physiology improved -Discontinue clindamycin.  ID consulted to consolidate antibiotics. -Continue to follow culture data. -Seen by plastic surgery-recommending leaving dressing in place until Friday then replace.  High-protein low sugar diet. -Vitamin C, zinc, supplemental boost -Wound care per plastic surgery  Patient has Foley catheter in place to keep her lower abdominal and thigh wounds clean.  Oligoanuric Anasarca/hypoalbuminemia -Continue IV albumin for 1 more day.  Acute blood loss anemia -Requiring frequent transfusions 3 U so far (4/18; 4/25; 4/28).  Maintain hemoglobin greater than 7.  Diabetes mellitus type 2, fairly well controlled now -Sliding scale -Levemir 5 units daily  History of schizophrenia with bipolar disorder -Continue Haldol, valproate and Cogentin  Obesity with BMI greater than 35 -Weight loss diet and exercise  PT recommending CIR,  rehab team consulted.  DVT prophylaxis: SCDs Code Status: Full code Family Communication:    Status is: Inpatient  Remains inpatient appropriate because:Hemodynamically unstable.  Still getting wound care dressing and IV antibiotics.  These will be adjusted over next 24 hours.   Dispo: The patient is from: Home              Anticipated d/c is to: CX[facility]              Anticipated d/c date is: > 3 days              Patient currently is not medically stable to d/c.  Subjective: Still having mild lower abdominal pain as expected but no other complaints.  No acute events overnight.  Review of Systems Otherwise negative except as per HPI, including: General: Denies fever, chills, night sweats or unintended weight loss. Resp: Denies cough, wheezing, shortness of breath. Cardiac: Denies chest pain, palpitations, orthopnea, paroxysmal nocturnal dyspnea. GI: Denies , nausea, vomiting, diarrhea or constipation GU: Denies dysuria, frequency, hesitancy or incontinence MS: Denies muscle aches, joint pain or swelling Neuro: Denies headache, neurologic deficits (focal weakness, numbness, tingling), abnormal gait Psych: Denies anxiety, depression, SI/HI/AVH Skin: Denies new rashes or lesions ID: Denies sick contacts, exotic exposures, travel  Examination:  Constitutional: Appears chronically ill Respiratory: Clear to auscultation bilaterally Cardiovascular: Normal sinus rhythm, no rubs Abdomen: Nontender nondistended good bowel sounds Musculoskeletal: No edema noted Skin: Bilateral groin dressing noted.  Appears to be clean dry and intact. Neurologic: CN 2-12 grossly intact.  And nonfocal Psychiatric: Normal judgment and insight. Alert and oriented x 3. Normal mood. PICC line in place Foley catheter in place  Objective: Vitals:   07/10/19 2033  07/11/19 0100 07/11/19 0107 07/11/19 0701  BP:   128/79 108/75  Pulse:   (!) 102 97  Resp:  20 20 (!) 26  Temp: 99.5 F (37.5 C)  98 F (36.7  C) 98.2 F (36.8 C)  TempSrc: Oral  Oral Oral  SpO2:   98% 98%  Weight:    120.6 kg  Height:        Intake/Output Summary (Last 24 hours) at 07/11/2019 1126 Last data filed at 07/11/2019 0500 Gross per 24 hour  Intake 1384.52 ml  Output 100 ml  Net 1284.52 ml   Filed Weights   07/09/19 0420 07/10/19 0016 07/11/19 0701  Weight: 97.8 kg 122.1 kg 120.6 kg     Data Reviewed:   CBC: Recent Labs  Lab 07/08/19 0413 07/08/19 0413 07/08/19 2152 07/09/19 0215 07/09/19 0738 07/10/19 0544 07/11/19 0500  WBC 7.1  --  7.7 7.6  --  6.5 6.0  HGB 8.6*   < > 7.7* 6.1* 8.3* 8.2* 7.6*  HCT 28.5*   < > 25.5* 20.7* 26.5* 26.2* 24.5*  MCV 89.6  --  91.1 90.4  --  87.3 89.1  PLT 527*  --  481* 397  --  409* 410*   < > = values in this interval not displayed.   Basic Metabolic Panel: Recent Labs  Lab 07/08/19 0413 07/08/19 2152 07/09/19 0215 07/10/19 0544 07/11/19 0500  NA 137 136 138 138 139  K 3.6 3.9 3.6 3.2* 3.3*  CL 103 103 106 105 107  CO2 28 26 24 24 26   GLUCOSE 107* 174* 121* 131* 94  BUN 10 11 13 10 9   CREATININE 0.45 0.62 0.66 0.61 0.55  CALCIUM 7.5* 6.8* 6.8* 7.3* 7.4*  MG  --   --   --   --  1.4*   GFR: Estimated Creatinine Clearance: 118.7 mL/min (by C-G formula based on SCr of 0.55 mg/dL). Liver Function Tests: Recent Labs  Lab 07/07/19 0406 07/10/19 0544  AST  --  11*  ALT  --  8  ALKPHOS  --  93  BILITOT  --  0.4  PROT  --  5.0*  ALBUMIN <1.0* 1.2*   No results for input(s): LIPASE, AMYLASE in the last 168 hours. No results for input(s): AMMONIA in the last 168 hours. Coagulation Profile: No results for input(s): INR, PROTIME in the last 168 hours. Cardiac Enzymes: No results for input(s): CKTOTAL, CKMB, CKMBINDEX, TROPONINI in the last 168 hours. BNP (last 3 results) No results for input(s): PROBNP in the last 8760 hours. HbA1C: No results for input(s): HGBA1C in the last 72 hours. CBG: Recent Labs  Lab 07/10/19 0823 07/10/19 1152  07/10/19 1732 07/10/19 2230 07/11/19 0828  GLUCAP 119* 91 89 96 85   Lipid Profile: No results for input(s): CHOL, HDL, LDLCALC, TRIG, CHOLHDL, LDLDIRECT in the last 72 hours. Thyroid Function Tests: No results for input(s): TSH, T4TOTAL, FREET4, T3FREE, THYROIDAB in the last 72 hours. Anemia Panel: No results for input(s): VITAMINB12, FOLATE, FERRITIN, TIBC, IRON, RETICCTPCT in the last 72 hours. Sepsis Labs: Recent Labs  Lab 07/08/19 2220 07/09/19 0215  LATICACIDVEN 2.7* 2.4*    Recent Results (from the past 240 hour(s))  Culture, blood (x 2)     Status: None (Preliminary result)   Collection Time: 07/08/19 10:42 PM   Specimen: BLOOD  Result Value Ref Range Status   Specimen Description BLOOD RIGHT ARM  Final   Special Requests   Final    BOTTLES DRAWN  AEROBIC AND ANAEROBIC Blood Culture results may not be optimal due to an excessive volume of blood received in culture bottles   Culture   Final    NO GROWTH 2 DAYS Performed at [facility], [address] [place], [place] [address]    Report Status PENDING  Incomplete  Culture, blood (x 2)     Status: None (Preliminary result)   Collection Time: 07/08/19 10:50 PM   Specimen: BLOOD  Result Value Ref Range Status   Specimen Description BLOOD RIGHT HAND  Final   Special Requests   Final    BOTTLES DRAWN AEROBIC ONLY Blood Culture results may not be optimal due to an excessive volume of blood received in culture bottles   Culture   Final    NO GROWTH 2 DAYS Performed at [facility], [address] [place], [place] [address]    Report Status PENDING  Incomplete         Radiology Studies: VAS US LOWER EXTREMITY VENOUS (DVT)  Result Date: 07/09/2019  Lower Venous DVTStudy Indications: Pain.  Limitations: Bilateral bandaging. Comparison Study: no prior Performing Technologist: Blanch MediaMegan Riddle RVS  Examination Guidelines: A complete evaluation includes B-mode imaging, spectral Doppler, color Doppler, and  power Doppler as needed of all accessible portions of each vessel. Bilateral testing is considered an integral part of a complete examination. Limited examinations for reoccurring indications may be performed as noted. The reflux portion of the exam is performed with the patient in reverse Trendelenburg.  +---------+---------------+---------+-----------+----------+--------------+ RIGHT    CompressibilityPhasicitySpontaneityPropertiesThrombus Aging +---------+---------------+---------+-----------+----------+--------------+ CFV                                                   Not visualized +---------+---------------+---------+-----------+----------+--------------+ SFJ                                                   Not visualized +---------+---------------+---------+-----------+----------+--------------+ FV Prox                                               Not visualized +---------+---------------+---------+-----------+----------+--------------+ FV Mid                                                Not visualized +---------+---------------+---------+-----------+----------+--------------+ FV Distal                                             Not visualized +---------+---------------+---------+-----------+----------+--------------+ PFV                                                   Not visualized +---------+---------------+---------+-----------+----------+--------------+ POP      Full  Yes      Yes                                 +---------+---------------+---------+-----------+----------+--------------+ PTV      Full                                                        +---------+---------------+---------+-----------+----------+--------------+ PERO     Full                                                        +---------+---------------+---------+-----------+----------+--------------+    +---------+---------------+---------+-----------+----------+--------------+ LEFT     CompressibilityPhasicitySpontaneityPropertiesThrombus Aging +---------+---------------+---------+-----------+----------+--------------+ CFV                                                   Not visualized +---------+---------------+---------+-----------+----------+--------------+ SFJ                                                   Not visualized +---------+---------------+---------+-----------+----------+--------------+ FV Prox                                               Not visualized +---------+---------------+---------+-----------+----------+--------------+ FV Mid                  Yes      Yes                                 +---------+---------------+---------+-----------+----------+--------------+ FV Distal               Yes      Yes                                 +---------+---------------+---------+-----------+----------+--------------+ PFV                                                   Not visualized +---------+---------------+---------+-----------+----------+--------------+ POP      Full           Yes      Yes                                 +---------+---------------+---------+-----------+----------+--------------+ PTV      Full                                                        +---------+---------------+---------+-----------+----------+--------------+  PERO     Full                                                        +---------+---------------+---------+-----------+----------+--------------+     Summary: RIGHT: - There is no evidence of deep vein thrombosis in the lower extremity. However, portions of this examination were limited- see technologist comments above.  - No cystic structure found in the popliteal fossa.  LEFT: - There is no evidence of deep vein thrombosis in the lower extremity. However, portions of this examination were  limited- see technologist comments above.  - No cystic structure found in the popliteal fossa.  *See table(s) above for measurements and observations. Electronically signed by Lemar Livings MD on 07/09/2019 at 8:19:35 PM.    Final    [place] EKG Site Rite  Result Date: 07/09/2019 If Site Rite image not attached, placement could not be confirmed due to current cardiac rhythm.       Scheduled Meds: . sodium chloride   Intravenous Once  . acetaminophen  1,000 mg Oral Q6H  . vitamin C  500 mg Oral BID  . benztropine  1 mg Oral BID  . chlorhexidine  15 mL Mouth Rinse BID  . Chlorhexidine Gluconate Cloth  6 each Topical Daily  . feeding supplement  1 Container Oral TID BM  . feeding supplement (GLUCERNA SHAKE)  237 mL Oral TID BM  . Gerhardt's butt cream   Topical BID  . haloperidol  10 mg Oral q morning - 10a  . haloperidol  20 mg Oral QHS  . insulin aspart  0-5 Units Subcutaneous QHS  . insulin aspart  0-9 Units Subcutaneous TID WC  . insulin detemir  5 Units Subcutaneous Daily  . mouth rinse  15 mL Mouth Rinse q12n4p  . multivitamin with minerals  1 tablet Oral Daily  . nutrition supplement (JUVEN)  1 packet Oral BID BM  . pantoprazole  40 mg Oral Daily  . sodium chloride flush  10-40 mL Intracatheter Q12H  . sodium chloride flush  10-40 mL Intracatheter Q12H  . valproic acid  250 mg Oral BID  . zinc sulfate  220 mg Oral Daily   Continuous Infusions: . sodium chloride 10 mL/hr at 07/10/19 0626  . albumin human 12.5 g (07/11/19 0700)  . ceFEPime (MAXIPIME) IV 2 g (07/11/19 0707)  . lactated ringers 125 mL/hr at 07/10/19 0648  . potassium chloride 10 mEq (07/11/19 1118)  . vancomycin 1,000 mg (07/11/19 1117)     LOS: 18 days   Time spent= 35 mins    Zoiey Christy Joline Maxcy, MD [facility]  If 7PM-7AM, please contact night-coverage  07/11/2019, 11:26 AM

## 2019-07-11 NOTE — Progress Notes (Signed)
Inpatient Rehabilitation-Admissions Coordinator   CIR consult received again. (please see my note from 4/21 for details regarding last visit).   Spoke with RN at the bedside. Pt continues to have extensive wound care needs with multiple daily changes needed. With permission, I again spoke with the patient's mother, Steward Drone regarding DC support. Per Steward Drone, she and her husband are unable to provide any hands on physical assistance at DC and cannot assist with wound care management. Based on limited caregiver assistance and extensive wound care needs, [facility] recommends SNF to give pt an extended period of time for wound care and a slower-paced rehab program. [facility] has notified her treatment team and will sign off. Did discuss possible LTAC options with SW/CM to see if she would qualify.   Cheri Rous, OTR/L  Rehab Admissions Coordinator  [phone] 07/11/2019 4:52 PM'

## 2019-07-11 NOTE — Consult Note (Signed)
[facility]    Date of Admission:  06/23/2019     Total days of antibiotics 14  Day 3 vanc + cefepime                Reason for Consult: necrotizing soft tissue wound     Referring Provider: Nelson Chimes  Primary Care Provider: Patient, No Pcp Per    Assessment: Kimberly Orozco is a 44 y.o. female with poorly controlled diabetes here for treatment of extensive necrotizing soft tissue wounds to the perineum, R thigh and lower abdomen. She has had serial debridements with last surgery day on 4/27. Only vaginal flora identified on material sent from abscess and nothing speciated or predominating described. While her MRSA PCR was positive we would have seen growth on the plate from this easily; will D/C vancomycin.  She has antibiotic associated diarrhea likely due to clindamycin. With no growth indicating pseudomonas will stop cefepime and convert to PO augmentin BID x 7 more days. Hopefully she can tolerate this better with regards to firming up her stool.   She is in a tough situation with regards to her complex wound care and inability to provide self care. She states her mother is ill herself and father is in and out of the hospital. Looking at [facility] placement for level of care needed with wounds.  She is obviously at high risk for re-infection of wounds given location and incontinence. Appreciate bedside team's efforts to clean wounds frequently and often to limit soilage as much as possible.     Plan: 1. Stop vanc / cefepime 2. Start Augmentin BID x 7d 3. Will need close wound follow up after hospitalization  4. Planning SNF at D/C per LCSW notes     Active Problems:   DKA (diabetic ketoacidoses) (HCC)   Necrotizing cellulitis   Septic shock (HCC)   AKI (acute kidney injury) (HCC)   Acute respiratory failure (HCC)   . sodium chloride   Intravenous Once  . acetaminophen  1,000 mg Oral Q6H  . vitamin C  500 mg Oral BID  . benztropine  1 mg Oral  BID  . chlorhexidine  15 mL Mouth Rinse BID  . Chlorhexidine Gluconate Cloth  6 each Topical Daily  . feeding supplement  1 Container Oral TID BM  . feeding supplement (GLUCERNA SHAKE)  237 mL Oral TID BM  . Gerhardt's butt cream   Topical BID  . haloperidol  10 mg Oral q morning - 10a  . haloperidol  20 mg Oral QHS  . insulin aspart  0-5 Units Subcutaneous QHS  . insulin aspart  0-9 Units Subcutaneous TID WC  . insulin detemir  5 Units Subcutaneous Daily  . mouth rinse  15 mL Mouth Rinse q12n4p  . multivitamin with minerals  1 tablet Oral Daily  . nutrition supplement (JUVEN)  1 packet Oral BID BM  . pantoprazole  40 mg Oral Daily  . sodium chloride flush  10-40 mL Intracatheter Q12H  . sodium chloride flush  10-40 mL Intracatheter Q12H  . valproic acid  250 mg Oral BID  . zinc sulfate  220 mg Oral Daily    HPI: Kimberly Orozco is a 44 y.o. female admitted on 06/23/2019 from home to evaluate altered mental status and necrotizing soft tissue wounds involving the lower abdomen and perineum/labia.   She has a pmhx including T2DM, schizophrenia, bipolar disorder.   Much of the history for her current  problem was obtained from the chart review as she was not able to provide much regarding specific details prior to admission. She was noted to have had some episodes of confusion over the past few days and was missing some of her diabetes medications (insulin dependent) and psychiatric medications for at least a month. She was also apparently unaware of any wounds to her groin/abd. Imaging with CT revealed no involvement of deep structure.  She was started on Vanc, Cefepime, Clindamycin and referred to General and Plastic surgery for care. She has had serial debridements with the last surgery on 4/27.  She has a large necrotic soft tissue infection of the R medial thigh, inferior R abdominal wall, R labia extending in to the mons pubis and over to the left labia. Underlying fascia was viable. She  required ICU care post op and has had some trouble with hypotension related to possible second sepsis episode vs acute bleeding from wounds. She had broad spectrum antibiotics restarted recently on 4/27 with return trip to ICU given hypotension and fever to 101 F . She is now 4 days into current round and on vancomycin + Cefepime and was up until late yesterday receiving clindamycin.   She is occasionally incontinent of stool. The stool is described to be mushy and not liquid enough for flexiseal. Urine is also leaking around her foley catheter and contaminates wounds.   She had one surgical culture taken from labial abscess that revealed normal vaginal flora on plates but abundant Gram positive rods, negative rods and variable rods with gram positive cocci. No MRSA identified.    Review of Systems: Review of Systems  Unable to perform ROS: Mental acuity    Past Medical History:  Diagnosis Date  . Bipolar disorder ([place])   . Diabetes mellitus without complication ([place])   . Schizophrenia ([place])     Social History   Tobacco Use  . Smoking status: Never Smoker  . Smokeless tobacco: Never Used  Substance Use Topics  . Alcohol use: Not on file  . Drug use: Not on file    Family History  Family history unknown: Yes   No Known Allergies  OBJECTIVE: Blood pressure 108/75, pulse 97, temperature 98.2 F (36.8 C), temperature source Oral, resp. rate (!) 26, height 5\' 6"  (1.676 m), weight 120.6 kg, SpO2 98 %.  Physical Exam Vitals and nursing note reviewed.  Constitutional:      General: She is not in acute distress.    Comments: Obese female resting in bed. Comfortable.   HENT:     Mouth/Throat:     Mouth: Mucous membranes are moist.     Pharynx: Oropharynx is clear.  Eyes:     General: No scleral icterus.    Pupils: Pupils are equal, round, and reactive to light.  Cardiovascular:     Rate and Rhythm: Normal rate and regular rhythm.  Pulmonary:     Effort: Pulmonary effort  is normal.     Breath sounds: Normal breath sounds.  Abdominal:     General: Bowel sounds are normal. There is no distension.     Palpations: Abdomen is soft.     Comments: Dressings currently clean/dry along abdomen and R thigh.   Musculoskeletal:     Cervical back: Normal range of motion.  Skin:    General: Skin is warm and dry.     Capillary Refill: Capillary refill takes less than 2 seconds.  Neurological:     Mental Status: Mental status is at  baseline.  Psychiatric:     Comments: Flat affect      Lab Results Lab Results  Component Value Date   WBC 6.0 07/11/2019   HGB 7.6 (L) 07/11/2019   HCT 24.5 (L) 07/11/2019   MCV 89.1 07/11/2019   PLT 410 (H) 07/11/2019    Lab Results  Component Value Date   CREATININE 0.55 07/11/2019   BUN 9 07/11/2019   NA 139 07/11/2019   K 3.3 (L) 07/11/2019   CL 107 07/11/2019   CO2 26 07/11/2019    Lab Results  Component Value Date   ALT 8 07/10/2019   AST 11 (L) 07/10/2019   ALKPHOS 93 07/10/2019   BILITOT 0.4 07/10/2019     Microbiology: Recent Results (from the past 240 hour(s))  Culture, blood (x 2)     Status: None (Preliminary result)   Collection Time: 07/08/19 10:42 PM   Specimen: BLOOD  Result Value Ref Range Status   Specimen Description BLOOD RIGHT ARM  Final   Special Requests   Final    BOTTLES DRAWN AEROBIC AND ANAEROBIC Blood Culture results may not be optimal due to an excessive volume of blood received in culture bottles   Culture   Final    NO GROWTH 2 DAYS Performed at [facility], [address], [place], [place] [address]    Report Status PENDING  Incomplete  Culture, blood (x 2)     Status: None (Preliminary result)   Collection Time: 07/08/19 10:50 PM   Specimen: BLOOD  Result Value Ref Range Status   Specimen Description BLOOD RIGHT HAND  Final   Special Requests   Final    BOTTLES DRAWN AEROBIC ONLY Blood Culture results may not be optimal due to an excessive volume of blood received in  culture bottles   Culture   Final    NO GROWTH 2 DAYS Performed at [facility], [address] [place], [place] [address]    Report Status PENDING  Incomplete     Rexene Alberts, MSN, NP-C [facility]  [email or web address] Pager: [phone] Office: [phone] [facility] Main Line: [phone]

## 2019-07-11 NOTE — Progress Notes (Signed)
Pharmacy Antibiotic Note  Kimberly Orozco is a 44 y.o. female admitted on 06/23/2019 with necrotizing soft tissue infection. Pharmacy has been consulted to dose vancomycin/cefepime. ID following and plans for Augmentin and d/c vancomycin. -SCr= 0.55  Plan: No antibiotic dose changes needed Cefepime 2g IV q8h vancomycin 1g IV Q 12 hrs. Goal AUC 400-550. No vancomycin levels planned as anticipate changes in therapy    Height: 5\' 6"  (167.6 cm) Weight: 120.6 kg (265 lb 14 oz) IBW/kg (Calculated) : 59.3  Temp (24hrs), Avg:99.1 F (37.3 C), Min:98 F (36.7 C), Max:100.7 F (38.2 C)  Recent Labs  Lab 07/08/19 0413 07/08/19 2152 07/08/19 2220 07/09/19 0215 07/10/19 0544 07/11/19 0500  WBC 7.1 7.7  --  7.6 6.5 6.0  CREATININE 0.45 0.62  --  0.66 0.61 0.55  LATICACIDVEN  --   --  2.7* 2.4*  --   --     Estimated Creatinine Clearance: 118.7 mL/min (by C-G formula based on SCr of 0.55 mg/dL).    No Known Allergies  Antimicrobials this admission: Vanc 4/12 >>4/23; 4/27>> Cefepime 4/12>>4/23; 4/27>> Clindamycin 4/12>>4/18 Flagyl 4/12 x1  Dose adjustments this admission: 4/17 VT: 14 - continue 1gm IV q12h  Microbiology results: 4/13 Surg cx (labia abscess): normal vaginal flora, no anaerobes 4/12 UCx - multiple species 4/12 MRSA PCR - positive 4/12 Covid - negative 4/12 Influenza - negative 4/12 BCx - neg  6/12, PharmD Clinical Pharmacist **Pharmacist phone directory can now be found on [email or web address] ([email or web address]).  Listed under [facility].

## 2019-07-11 NOTE — TOC Progression Note (Signed)
Transition of Care [facility]) - Progression Note    Patient Details  Name: Kimberly Orozco MRN: [id number] Date of Birth: [birth year]  Transition of Care [facility]) CM/SW Contact  Terrial Rhodes, LCSWA Phone Number: 07/11/2019, 3:18 PM  Clinical Narrative:     CSW spoke with Tresa Endo with CIR she is going to complete an assessment on patient to see if she is eligable for CIR. There are no SNF bed offers for patient at this time.  CSW will continue to follow    Expected Discharge Plan: Skilled Nursing Facility Barriers to Discharge: Continued Medical Work up  Expected Discharge Plan and Services Expected Discharge Plan: Skilled Nursing Facility       Living arrangements for the past 2 months: Single Family Home                                       Social Determinants of Health (SDOH) Interventions    Readmission Risk Interventions No flowsheet data found.

## 2019-07-12 DIAGNOSIS — E119 Type 2 diabetes mellitus without complications: Secondary | ICD-10-CM

## 2019-07-12 LAB — CBC
HCT: 26.2 % — ABNORMAL LOW (ref 36.0–46.0)
Hemoglobin: 7.9 g/dL — ABNORMAL LOW (ref 12.0–15.0)
MCH: 27.3 pg (ref 26.0–34.0)
MCHC: 30.2 g/dL (ref 30.0–36.0)
MCV: 90.7 fL (ref 80.0–100.0)
Platelets: 433 10*3/uL — ABNORMAL HIGH (ref 150–400)
RBC: 2.89 MIL/uL — ABNORMAL LOW (ref 3.87–5.11)
RDW: 16.1 % — ABNORMAL HIGH (ref 11.5–15.5)
WBC: 6.3 10*3/uL (ref 4.0–10.5)
nRBC: 0.3 % — ABNORMAL HIGH (ref 0.0–0.2)

## 2019-07-12 LAB — GLUCOSE, CAPILLARY
Glucose-Capillary: 91 mg/dL (ref 70–99)
Glucose-Capillary: 114 mg/dL — ABNORMAL HIGH (ref 70–99)
Glucose-Capillary: 109 mg/dL — ABNORMAL HIGH (ref 70–99)
Glucose-Capillary: 102 mg/dL — ABNORMAL HIGH (ref 70–99)
Glucose-Capillary: 108 mg/dL — ABNORMAL HIGH (ref 70–99)

## 2019-07-12 LAB — BASIC METABOLIC PANEL
Anion gap: 7 (ref 5–15)
BUN: 9 mg/dL (ref 6–20)
CO2: 26 mmol/L (ref 22–32)
Calcium: 7.7 mg/dL — ABNORMAL LOW (ref 8.9–10.3)
Chloride: 107 mmol/L (ref 98–111)
Creatinine, Ser: 0.51 mg/dL (ref 0.44–1.00)
GFR calc Af Amer: >60 mL/min (ref >60)
GFR calc non Af Amer: >60 mL/min (ref >60)
Glucose, Bld: 104 mg/dL — ABNORMAL HIGH (ref 70–99)
Potassium: 3.6 mmol/L (ref 3.5–5.1)
Sodium: 140 mmol/L (ref 135–145)

## 2019-07-12 LAB — MAGNESIUM: Magnesium: 1.7 mg/dL (ref 1.7–2.4)

## 2019-07-12 MED ORDER — LOPERAMIDE HCL 2 MG PO CAPS
4.0000 mg | ORAL_CAPSULE | Freq: Four times a day (QID) | ORAL | Status: DC | PRN
  Administered 2019-07-12 – 2019-07-16 (×2): 4 mg via ORAL
  Filled 2019-07-12 (×2): qty 2

## 2019-07-12 MED ORDER — AMOXICILLIN-POT CLAVULANATE 875-125 MG PO TABS
1.0000 | ORAL_TABLET | Freq: Two times a day (BID) | ORAL | Status: AC
  Administered 2019-07-12 – 2019-07-18 (×14): 1 via ORAL
  Filled 2019-07-12 (×14): qty 1

## 2019-07-12 MED ORDER — MAGNESIUM OXIDE 400 (241.3 MG) MG PO TABS
800.0000 mg | ORAL_TABLET | Freq: Once | ORAL | Status: AC
  Administered 2019-07-12: 800 mg via ORAL
  Filled 2019-07-12: qty 2

## 2019-07-12 MED ORDER — POTASSIUM CHLORIDE CRYS ER 20 MEQ PO TBCR
40.0000 meq | EXTENDED_RELEASE_TABLET | Freq: Once | ORAL | Status: AC
  Administered 2019-07-12: 40 meq via ORAL
  Filled 2019-07-12: qty 2

## 2019-07-12 NOTE — Progress Notes (Signed)
PROGRESS NOTE    Kimberly Orozco  MWN:[id number] DOB: 01/24/1976 DOA: 06/23/2019 PCP: Patient, No Pcp Per   Brief Narrative:  44 year old with history of DM2, schizophrenia, bipolar disorder admitted with DKA in the setting of necrotizing fasciitis of the right groin, left inguinal crease and right medial thigh.  She underwent debridement on 4/13 and then again on 4/15.  Initially intubated on 4/12, thereafter extubated on 4/16 and transferred out of the ICU on 4/17.  Thoracic surgery team was consulted, another debridement was performed on 4/27 with estimated blood loss 450 cc.  On 4/28 patient again became febrile, hypotensive oligoanuric requiring transfer to the ICU.  Has been receiving multiple course of antibiotics.  ID was consulted, antibiotics were consolidated down to Augmentin   Assessment & Plan:   Active Problems:   DKA (diabetic ketoacidoses) (HCC)   Necrotizing cellulitis   Septic shock (HCC)   AKI (acute kidney injury) (HCC)   Acute respiratory failure (HCC)   Leukocytosis   Septic shock secondary to necrotizing fasciitis of abdominal wound, perineum and the right thigh -Shock physiology improved -Discontinue vancomycin/cefepime/vancomycin.  Now on Augmentin, total of 7 days.  Last day 5/7 -Continue to follow culture data. -Seen by plastic surgery-recommending leaving dressing in place until Friday then replace.  High-protein low sugar diet. -Vitamin C, zinc, supplemental boost -Wound care per plastic surgery  Patient has Foley catheter in place to keep her lower abdominal and thigh wounds clean.  Oligoanuric Anasarca/hypoalbuminemia -Continue IV albumin for 1 more day.  Acute blood loss anemia -Requiring frequent transfusions 3 U so far (4/18; 4/25; 4/28).  Maintain hemoglobin greater than 7.  Diabetes mellitus type 2, fairly well controlled now -Sliding scale -Levemir 5 units daily  History of schizophrenia with bipolar disorder -Continue Haldol, valproate  and Cogentin  Obesity with BMI greater than 35 -Weight loss diet and exercise  PT recommending CIR, but deemed to be too aggressive for the patient therefore will need SNF  DVT prophylaxis: SCDs Code Status: Full code Family Communication:    Status is: Inpatient  Remains inpatient appropriate because:Hemodynamically unstable.  Still getting wound care dressing and IV antibiotics.  These will be adjusted over next 24 hours.   Dispo: The patient is from: Home              Anticipated d/c is to: SNF              Anticipated d/c date is: > 3 days              Patient currently is not medically stable to d/c.  Subjective: Feels better but still has lower abdominal pain as expected.  No different than before.  Review of Systems Otherwise negative except as per HPI, including: General: Denies fever, chills, night sweats or unintended weight loss. Resp: Denies cough, wheezing, shortness of breath. Cardiac: Denies chest pain, palpitations, orthopnea, paroxysmal nocturnal dyspnea. GI: Denies abdominal pain, nausea, vomiting, diarrhea or constipation GU: Denies dysuria, frequency, hesitancy or incontinence MS: Denies muscle aches, joint pain or swelling Neuro: Denies headache, neurologic deficits (focal weakness, numbness, tingling), abnormal gait Psych: Denies anxiety, depression, SI/HI/AVH Skin: Denies new rashes or lesions ID: Denies sick contacts, exotic exposures, travel  Examination:  Constitutional: Appears chronically ill Respiratory: Clear to auscultation bilaterally Cardiovascular: Normal sinus rhythm, no rubs Abdomen: Nontender nondistended good bowel sounds Musculoskeletal: No edema noted Skin: Bilateral groin dressing in place Neurologic: CN 2-12 grossly intact.  And nonfocal Psychiatric: Normal judgment and insight. Alert  and oriented x 3. Normal mood. PICC line in place Foley catheter in place  Objective: Vitals:   07/11/19 2030 07/12/19 0648 07/12/19 0657  07/12/19 0736  BP: 131/82  97/78 139/83  Pulse: 100  (!) 101 (!) 103  Resp: 18  (!) 21 20  Temp: 99 F (37.2 C)  98 F (36.7 C)   TempSrc: Oral  Oral   SpO2: 98%  98% 100%  Weight:  119.2 kg    Height:        Intake/Output Summary (Last 24 hours) at 07/12/2019 1002 Last data filed at 07/11/2019 2345 Gross per 24 hour  Intake 240 ml  Output 1150 ml  Net -910 ml   Filed Weights   07/10/19 0016 07/11/19 0701 07/12/19 0648  Weight: 122.1 kg 120.6 kg 119.2 kg     Data Reviewed:   CBC: Recent Labs  Lab 07/08/19 2152 07/08/19 2152 07/09/19 0215 07/09/19 0738 07/10/19 0544 07/11/19 0500 07/12/19 0500  WBC 7.7  --  7.6  --  6.5 6.0 6.3  HGB 7.7*   < > 6.1* 8.3* 8.2* 7.6* 7.9*  HCT 25.5*   < > 20.7* 26.5* 26.2* 24.5* 26.2*  MCV 91.1  --  90.4  --  87.3 89.1 90.7  PLT 481*  --  397  --  409* 410* 433*   < > = values in this interval not displayed.   Basic Metabolic Panel: Recent Labs  Lab 07/08/19 2152 07/09/19 0215 07/10/19 0544 07/11/19 0500 07/12/19 0500  NA 136 138 138 139 140  K 3.9 3.6 3.2* 3.3* 3.6  CL 103 106 105 107 107  CO2 26 24 24 26 26   GLUCOSE 174* 121* 131* 94 104*  BUN 11 13 10 9 9   CREATININE 0.62 0.66 0.61 0.55 0.51  CALCIUM 6.8* 6.8* 7.3* 7.4* 7.7*  MG  --   --   --  1.4* 1.7   GFR: Estimated Creatinine Clearance: 118 mL/min (by C-G formula based on SCr of 0.51 mg/dL). Liver Function Tests: Recent Labs  Lab 07/07/19 0406 07/10/19 0544  AST  --  11*  ALT  --  8  ALKPHOS  --  93  BILITOT  --  0.4  PROT  --  5.0*  ALBUMIN <1.0* 1.2*   No results for input(s): LIPASE, AMYLASE in the last 168 hours. No results for input(s): AMMONIA in the last 168 hours. Coagulation Profile: No results for input(s): INR, PROTIME in the last 168 hours. Cardiac Enzymes: No results for input(s): CKTOTAL, CKMB, CKMBINDEX, TROPONINI in the last 168 hours. BNP (last 3 results) No results for input(s): PROBNP in the last 8760 hours. HbA1C: No results for  input(s): HGBA1C in the last 72 hours. CBG: Recent Labs  Lab 07/11/19 0828 07/11/19 1130 07/11/19 1716 07/11/19 2109 07/12/19 0758  GLUCAP 85 108* 111* 101* 91   Lipid Profile: No results for input(s): CHOL, HDL, LDLCALC, TRIG, CHOLHDL, LDLDIRECT in the last 72 hours. Thyroid Function Tests: No results for input(s): TSH, T4TOTAL, FREET4, T3FREE, THYROIDAB in the last 72 hours. Anemia Panel: No results for input(s): VITAMINB12, FOLATE, FERRITIN, TIBC, IRON, RETICCTPCT in the last 72 hours. Sepsis Labs: Recent Labs  Lab 07/08/19 2220 07/09/19 0215  LATICACIDVEN 2.7* 2.4*    Recent Results (from the past 240 hour(s))  Culture, blood (x 2)     Status: None (Preliminary result)   Collection Time: 07/08/19 10:42 PM   Specimen: BLOOD  Result Value Ref Range Status   Specimen Description  BLOOD RIGHT ARM  Final   Special Requests   Final    BOTTLES DRAWN AEROBIC AND ANAEROBIC Blood Culture results may not be optimal due to an excessive volume of blood received in culture bottles   Culture   Final    NO GROWTH 4 DAYS Performed at [facility], [address] [place], [place] [address]    Report Status PENDING  Incomplete  Culture, blood (x 2)     Status: None (Preliminary result)   Collection Time: 07/08/19 10:50 PM   Specimen: BLOOD  Result Value Ref Range Status   Specimen Description BLOOD RIGHT HAND  Final   Special Requests   Final    BOTTLES DRAWN AEROBIC ONLY Blood Culture results may not be optimal due to an excessive volume of blood received in culture bottles   Culture   Final    NO GROWTH 4 DAYS Performed at [facility], [address], [place], [place] [address]    Report Status PENDING  Incomplete         Radiology Studies: No results found.      Scheduled Meds: . sodium chloride   Intravenous Once  . acetaminophen  1,000 mg Oral Q6H  . amoxicillin-clavulanate  1 tablet Oral Q12H  . vitamin C  500 mg Oral BID  . benztropine  1 mg  Oral BID  . chlorhexidine  15 mL Mouth Rinse BID  . Chlorhexidine Gluconate Cloth  6 each Topical Daily  . feeding supplement  1 Container Oral TID BM  . feeding supplement (GLUCERNA SHAKE)  237 mL Oral TID BM  . Gerhardt's butt cream   Topical BID  . haloperidol  10 mg Oral q morning - 10a  . haloperidol  20 mg Oral QHS  . insulin aspart  0-5 Units Subcutaneous QHS  . insulin aspart  0-9 Units Subcutaneous TID WC  . insulin detemir  5 Units Subcutaneous Daily  . magnesium oxide  800 mg Oral Once  . mouth rinse  15 mL Mouth Rinse q12n4p  . multivitamin with minerals  1 tablet Oral Daily  . nutrition supplement (JUVEN)  1 packet Oral BID BM  . pantoprazole  40 mg Oral Daily  . potassium chloride  40 mEq Oral Once  . sodium chloride flush  10-40 mL Intracatheter Q12H  . sodium chloride flush  10-40 mL Intracatheter Q12H  . valproic acid  250 mg Oral BID  . zinc sulfate  220 mg Oral Daily   Continuous Infusions: . sodium chloride 10 mL/hr at 07/10/19 0626  . lactated ringers 125 mL/hr at 07/10/19 0648     LOS: 19 days   Time spent= 35 mins    Evianna Chandran Joline Maxcy, MD [facility]  If 7PM-7AM, please contact night-coverage  07/12/2019, 10:02 AM

## 2019-07-13 ENCOUNTER — Encounter (HOSPITAL_COMMUNITY): Payer: Self-pay | Admitting: Internal Medicine

## 2019-07-13 ENCOUNTER — Inpatient Hospital Stay (HOSPITAL_COMMUNITY): Payer: Medicare Other

## 2019-07-13 LAB — BASIC METABOLIC PANEL
Anion gap: 7 (ref 5–15)
BUN: 7 mg/dL (ref 6–20)
CO2: 27 mmol/L (ref 22–32)
Calcium: 8 mg/dL — ABNORMAL LOW (ref 8.9–10.3)
Chloride: 107 mmol/L (ref 98–111)
Creatinine, Ser: 0.53 mg/dL (ref 0.44–1.00)
GFR calc Af Amer: >60 mL/min (ref >60)
GFR calc non Af Amer: >60 mL/min (ref >60)
Glucose, Bld: 103 mg/dL — ABNORMAL HIGH (ref 70–99)
Potassium: 3.8 mmol/L (ref 3.5–5.1)
Sodium: 141 mmol/L (ref 135–145)

## 2019-07-13 LAB — GLUCOSE, CAPILLARY
Glucose-Capillary: 92 mg/dL (ref 70–99)
Glucose-Capillary: 107 mg/dL — ABNORMAL HIGH (ref 70–99)
Glucose-Capillary: 80 mg/dL (ref 70–99)
Glucose-Capillary: 125 mg/dL — ABNORMAL HIGH (ref 70–99)

## 2019-07-13 LAB — CULTURE, BLOOD (ROUTINE X 2)
Culture: NO GROWTH
Culture: NO GROWTH

## 2019-07-13 LAB — MAGNESIUM: Magnesium: 1.5 mg/dL — ABNORMAL LOW (ref 1.7–2.4)

## 2019-07-13 LAB — CBC
HCT: 26.7 % — ABNORMAL LOW (ref 36.0–46.0)
Hemoglobin: 8.1 g/dL — ABNORMAL LOW (ref 12.0–15.0)
MCH: 27.2 pg (ref 26.0–34.0)
MCHC: 30.3 g/dL (ref 30.0–36.0)
MCV: 89.6 fL (ref 80.0–100.0)
Platelets: 437 10*3/uL — ABNORMAL HIGH (ref 150–400)
RBC: 2.98 MIL/uL — ABNORMAL LOW (ref 3.87–5.11)
RDW: 16.3 % — ABNORMAL HIGH (ref 11.5–15.5)
WBC: 7.8 10*3/uL (ref 4.0–10.5)
nRBC: 0 % (ref 0.0–0.2)

## 2019-07-13 LAB — TROPONIN I (HIGH SENSITIVITY): Troponin I (High Sensitivity): 5 ng/L (ref <18)

## 2019-07-13 MED ORDER — MAGNESIUM SULFATE 2 GM/50ML IV SOLN
2.0000 g | Freq: Once | INTRAVENOUS | Status: AC
  Administered 2019-07-13: 2 g via INTRAVENOUS
  Filled 2019-07-13: qty 50

## 2019-07-13 MED ORDER — IOHEXOL 300 MG/ML  SOLN
100.0000 mL | Freq: Once | INTRAMUSCULAR | Status: AC | PRN
  Administered 2019-07-13: 100 mL via INTRAVENOUS

## 2019-07-13 MED ORDER — PROPRANOLOL HCL ER 80 MG PO CP24
80.0000 mg | ORAL_CAPSULE | Freq: Every day | ORAL | Status: DC
  Administered 2019-07-13 – 2019-07-24 (×12): 80 mg via ORAL
  Filled 2019-07-13 (×12): qty 1

## 2019-07-13 MED ORDER — IOHEXOL 9 MG/ML PO SOLN
500.0000 mL | ORAL | Status: AC
  Administered 2019-07-13 (×2): 500 mL via ORAL

## 2019-07-13 MED ORDER — ALUM & MAG HYDROXIDE-SIMETH 200-200-20 MG/5ML PO SUSP
30.0000 mL | Freq: Four times a day (QID) | ORAL | Status: DC | PRN
  Administered 2019-07-16: 30 mL via ORAL
  Filled 2019-07-13: qty 30

## 2019-07-13 MED ORDER — TEMAZEPAM 15 MG PO CAPS
30.0000 mg | ORAL_CAPSULE | Freq: Every day | ORAL | Status: DC
  Administered 2019-07-13 – 2019-07-23 (×11): 30 mg via ORAL
  Filled 2019-07-13 (×11): qty 2

## 2019-07-13 NOTE — Progress Notes (Signed)
PROGRESS NOTE    Kimberly Orozco  BMW:[id number] DOB: Nov 19, 1975 DOA: 06/23/2019 PCP: Patient, No Pcp Per   Brief Narrative:  44 year old with history of DM2, schizophrenia, bipolar disorder admitted with DKA in the setting of necrotizing fasciitis of the right groin, left inguinal crease and right medial thigh.  She underwent debridement on 4/13 and then again on 4/15.  Initially intubated on 4/12, thereafter extubated on 4/16 and transferred out of the ICU on 4/17.  Thoracic surgery team was consulted, another debridement was performed on 4/27 with estimated blood loss 450 cc.  On 4/28 patient again became febrile, hypotensive oligoanuric requiring transfer to the ICU.  Has been receiving multiple course of antibiotics.  ID was consulted, antibiotics were consolidated down to Augmentin   Assessment & Plan:   Active Problems:   DKA (diabetic ketoacidoses) (HCC)   Necrotizing cellulitis   Septic shock (HCC)   AKI (acute kidney injury) (HCC)   Acute respiratory failure (HCC)   Leukocytosis   Septic shock secondary to necrotizing fasciitis of abdominal wound, perineum and the right thigh -Shock physiology improved -Discontinue vancomycin/cefepime/vancomycin.  Now on Augmentin, total of 7 days.  Last day 5/7 -Continue to follow culture data. -Seen by plastic surgery-plastic surgery following. -Vitamin C, zinc, supplemental boost -Wound care per plastic surgery  Patient has Foley catheter in place to keep her lower abdominal and thigh wounds clean.  Urine leakage -Despite of Foley catheter in place, patient keeps on leaking urine.  Concern about fistula.  Will order CT abdomen with and without contrast to rule this out.  Oligoanuric Anasarca/hypoalbuminemia -Encourage oral nutrition  Acute blood loss anemia -Requiring frequent transfusions 3 U so far (4/18; 4/25; 4/28).  Maintain hemoglobin greater than 7.  Sinus tachycardia -Resume her home propranolol  Diabetes mellitus type  2, fairly well controlled now -Sliding scale -Levemir 5 units daily  History of schizophrenia with bipolar disorder -Continue Haldol, valproate and Cogentin  Obesity with BMI greater than 35 -Weight loss diet and exercise  PT recommending CIR, but deemed to be too aggressive for the patient therefore will need SNF  DVT prophylaxis: SCDs Code Status: Full code Family Communication:    Status is: Inpatient  Remains inpatient appropriate because:Hemodynamically unstable.  Still getting wound care dressing and IV antibiotics.  These will be adjusted over next 24 hours.   Dispo: The patient is from: Home              Anticipated d/c is to: SNF              Anticipated d/c date is: > 3 days              Patient currently is not medically stable to d/c.  Subjective: Feels okay.  Continues to have urine leakage despite a Foley inside.  She is having quite a bit of urine on bedpan concern about fistula.  Review of Systems Otherwise negative except as per HPI, including: General: Denies fever, chills, night sweats or unintended weight loss. Resp: Denies cough, wheezing, shortness of breath. Cardiac: Denies chest pain, palpitations, orthopnea, paroxysmal nocturnal dyspnea. GI: Denies abdominal pain, nausea, vomiting, diarrhea or constipation GU: Denies dysuria, frequency, hesitancy or incontinence MS: Denies muscle aches, joint pain or swelling Neuro: Denies headache, neurologic deficits (focal weakness, numbness, tingling), abnormal gait Psych: Denies anxiety, depression, SI/HI/AVH Skin: Denies new rashes or lesions ID: Denies sick contacts, exotic exposures, travel  Examination:  Constitutional: Appears chronically ill Respiratory: Clear to auscultation bilaterally Cardiovascular: Normal  sinus rhythm, no rubs Abdomen: Nontender nondistended good bowel sounds Musculoskeletal: No edema noted Skin: Groin dressing in place Neurologic: CN 2-12 grossly intact.  And  nonfocal Psychiatric: Normal judgment and insight. Alert and oriented x 3. Normal mood. PICC line in place Foley catheter in place  Objective: Vitals:   07/12/19 1608 07/12/19 2110 07/13/19 0619 07/13/19 0810  BP: 133/82 134/83 (!) 146/83   Pulse: (!) 105 (!) 102 (!) 104   Resp: 17 (!) 24 20 20   Temp: 99.7 F (37.6 C) 98.4 F (36.9 C) 98.7 F (37.1 C)   TempSrc: Oral Oral Oral   SpO2: 99% 99% 99%   Weight:   117.2 kg   Height:        Intake/Output Summary (Last 24 hours) at 07/13/2019 1033 Last data filed at 07/12/2019 2110 Gross per 24 hour  Intake 600 ml  Output --  Net 600 ml   Filed Weights   07/11/19 0701 07/12/19 0648 07/13/19 0619  Weight: 120.6 kg 119.2 kg 117.2 kg     Data Reviewed:   CBC: Recent Labs  Lab 07/09/19 0215 07/09/19 0215 07/09/19 0738 07/10/19 0544 07/11/19 0500 07/12/19 0500 07/13/19 0248  WBC 7.6  --   --  6.5 6.0 6.3 7.8  HGB 6.1*   < > 8.3* 8.2* 7.6* 7.9* 8.1*  HCT 20.7*   < > 26.5* 26.2* 24.5* 26.2* 26.7*  MCV 90.4  --   --  87.3 89.1 90.7 89.6  PLT 397  --   --  409* 410* 433* 437*   < > = values in this interval not displayed.   Basic Metabolic Panel: Recent Labs  Lab 07/09/19 0215 07/10/19 0544 07/11/19 0500 07/12/19 0500 07/13/19 0248  NA 138 138 139 140 141  K 3.6 3.2* 3.3* 3.6 3.8  CL 106 105 107 107 107  CO2 24 24 26 26 27   GLUCOSE 121* 131* 94 104* 103*  BUN 13 10 9 9 7   CREATININE 0.66 0.61 0.55 0.51 0.53  CALCIUM 6.8* 7.3* 7.4* 7.7* 8.0*  MG  --   --  1.4* 1.7 1.5*   GFR: Estimated Creatinine Clearance: 116.9 mL/min (by C-G formula based on SCr of 0.53 mg/dL). Liver Function Tests: Recent Labs  Lab 07/07/19 0406 07/10/19 0544  AST  --  11*  ALT  --  8  ALKPHOS  --  93  BILITOT  --  0.4  PROT  --  5.0*  ALBUMIN <1.0* 1.2*   No results for input(s): LIPASE, AMYLASE in the last 168 hours. No results for input(s): AMMONIA in the last 168 hours. Coagulation Profile: No results for input(s): INR,  PROTIME in the last 168 hours. Cardiac Enzymes: No results for input(s): CKTOTAL, CKMB, CKMBINDEX, TROPONINI in the last 168 hours. BNP (last 3 results) No results for input(s): PROBNP in the last 8760 hours. HbA1C: No results for input(s): HGBA1C in the last 72 hours. CBG: Recent Labs  Lab 07/12/19 1204 07/12/19 1607 07/12/19 2120 07/12/19 2157 07/13/19 0804  GLUCAP 114* 109* 102* 108* 92   Lipid Profile: No results for input(s): CHOL, HDL, LDLCALC, TRIG, CHOLHDL, LDLDIRECT in the last 72 hours. Thyroid Function Tests: No results for input(s): TSH, T4TOTAL, FREET4, T3FREE, THYROIDAB in the last 72 hours. Anemia Panel: No results for input(s): VITAMINB12, FOLATE, FERRITIN, TIBC, IRON, RETICCTPCT in the last 72 hours. Sepsis Labs: Recent Labs  Lab 07/08/19 2220 07/09/19 0215  LATICACIDVEN 2.7* 2.4*    Recent Results (from the past 240  hour(s))  Culture, blood (x 2)     Status: None   Collection Time: 07/08/19 10:42 PM   Specimen: BLOOD  Result Value Ref Range Status   Specimen Description BLOOD RIGHT ARM  Final   Special Requests   Final    BOTTLES DRAWN AEROBIC AND ANAEROBIC Blood Culture results may not be optimal due to an excessive volume of blood received in culture bottles   Culture   Final    NO GROWTH 5 DAYS Performed at [facility], [address], [place], [place] [address]    Report Status 07/13/2019 FINAL  Final  Culture, blood (x 2)     Status: None   Collection Time: 07/08/19 10:50 PM   Specimen: BLOOD  Result Value Ref Range Status   Specimen Description BLOOD RIGHT HAND  Final   Special Requests   Final    BOTTLES DRAWN AEROBIC ONLY Blood Culture results may not be optimal due to an excessive volume of blood received in culture bottles   Culture   Final    NO GROWTH 5 DAYS Performed at [facility], [address] [place], [place] [address]    Report Status 07/13/2019 FINAL  Final         Radiology Studies: No results  found.      Scheduled Meds: . sodium chloride   Intravenous Once  . acetaminophen  1,000 mg Oral Q6H  . amoxicillin-clavulanate  1 tablet Oral Q12H  . vitamin C  500 mg Oral BID  . benztropine  1 mg Oral BID  . chlorhexidine  15 mL Mouth Rinse BID  . Chlorhexidine Gluconate Cloth  6 each Topical Daily  . feeding supplement  1 Container Oral TID BM  . feeding supplement (GLUCERNA SHAKE)  237 mL Oral TID BM  . Gerhardt's butt cream   Topical BID  . haloperidol  10 mg Oral q morning - 10a  . haloperidol  20 mg Oral QHS  . insulin aspart  0-5 Units Subcutaneous QHS  . insulin aspart  0-9 Units Subcutaneous TID WC  . insulin detemir  5 Units Subcutaneous Daily  . mouth rinse  15 mL Mouth Rinse q12n4p  . multivitamin with minerals  1 tablet Oral Daily  . nutrition supplement (JUVEN)  1 packet Oral BID BM  . pantoprazole  40 mg Oral Daily  . propranolol ER  80 mg Oral Daily  . sodium chloride flush  10-40 mL Intracatheter Q12H  . sodium chloride flush  10-40 mL Intracatheter Q12H  . temazepam  30 mg Oral QHS  . valproic acid  250 mg Oral BID  . zinc sulfate  220 mg Oral Daily   Continuous Infusions: . sodium chloride 10 mL/hr at 07/10/19 0626  . lactated ringers 125 mL/hr at 07/10/19 0648     LOS: 20 days   Time spent= 35 mins    Yanitza Shvartsman Joline Maxcy, MD [facility]  If 7PM-7AM, please contact night-coverage  07/13/2019, 10:33 AM

## 2019-07-13 NOTE — Progress Notes (Signed)
Pt complained of chest pain 9/10. Paged on call Hospitalist. EKG done. Received order to draw one time Troponin and give prn morphine. When returned to room pt advised pain subsided and did not want morphine.

## 2019-07-13 NOTE — Progress Notes (Signed)
Pt still without chest pain. Troponin resulted as negative

## 2019-07-13 NOTE — Progress Notes (Signed)
Patient called to say she needed to pee, I put her on the bedpa, she urinated and it appeared that she had some flaky, small amount of stool. I called to clarify with Dr. Nelson Chimes and he stated CT of Abd/pelvis ordered to rule out fistula and to leave catheter in until otherwise directed. Will closely monitor. I called CT, they had some stat orders then would get to her CT

## 2019-07-14 ENCOUNTER — Inpatient Hospital Stay (HOSPITAL_COMMUNITY): Payer: Medicare Other

## 2019-07-14 DIAGNOSIS — K810 Acute cholecystitis: Secondary | ICD-10-CM

## 2019-07-14 LAB — GLUCOSE, CAPILLARY
Glucose-Capillary: 113 mg/dL — ABNORMAL HIGH (ref 70–99)
Glucose-Capillary: 122 mg/dL — ABNORMAL HIGH (ref 70–99)
Glucose-Capillary: 116 mg/dL — ABNORMAL HIGH (ref 70–99)
Glucose-Capillary: 111 mg/dL — ABNORMAL HIGH (ref 70–99)

## 2019-07-14 LAB — MAGNESIUM: Magnesium: 1.7 mg/dL (ref 1.7–2.4)

## 2019-07-14 LAB — CBC
HCT: 27.7 % — ABNORMAL LOW (ref 36.0–46.0)
Hemoglobin: 8.3 g/dL — ABNORMAL LOW (ref 12.0–15.0)
MCH: 27 pg (ref 26.0–34.0)
MCHC: 30 g/dL (ref 30.0–36.0)
MCV: 90.2 fL (ref 80.0–100.0)
Platelets: 431 10*3/uL — ABNORMAL HIGH (ref 150–400)
RBC: 3.07 MIL/uL — ABNORMAL LOW (ref 3.87–5.11)
RDW: 16.8 % — ABNORMAL HIGH (ref 11.5–15.5)
WBC: 8.8 10*3/uL (ref 4.0–10.5)
nRBC: 0.3 % — ABNORMAL HIGH (ref 0.0–0.2)

## 2019-07-14 LAB — BASIC METABOLIC PANEL
Anion gap: 9 (ref 5–15)
BUN: 5 mg/dL — ABNORMAL LOW (ref 6–20)
CO2: 27 mmol/L (ref 22–32)
Calcium: 7.8 mg/dL — ABNORMAL LOW (ref 8.9–10.3)
Chloride: 104 mmol/L (ref 98–111)
Creatinine, Ser: 0.49 mg/dL (ref 0.44–1.00)
GFR calc Af Amer: >60 mL/min (ref >60)
GFR calc non Af Amer: >60 mL/min (ref >60)
Glucose, Bld: 123 mg/dL — ABNORMAL HIGH (ref 70–99)
Potassium: 4.1 mmol/L (ref 3.5–5.1)
Sodium: 140 mmol/L (ref 135–145)

## 2019-07-14 LAB — HEPATIC FUNCTION PANEL
ALT: 8 U/L (ref 0–44)
AST: 18 U/L (ref 15–41)
Albumin: 1.6 g/dL — ABNORMAL LOW (ref 3.5–5.0)
Alkaline Phosphatase: 86 U/L (ref 38–126)
Bilirubin, Direct: <0.1 mg/dL (ref 0.0–0.2)
Total Bilirubin: 0.4 mg/dL (ref 0.3–1.2)
Total Protein: 5.7 g/dL — ABNORMAL LOW (ref 6.5–8.1)

## 2019-07-14 NOTE — Progress Notes (Signed)
Patient ID: Kimberly Orozco, female   DOB: 1976-02-25, 44 y.o.   MRN: [id number]    6 Days Post-Op  Subjective: Patient known to our service for necrotizing soft tissue infection and multiple operations in the OR for debridement.  She has since undergone plastic surgery for closure of some of these wounds along with some placement of meshed primatrix Ag as well.  She apparently had some abdominal pain this weekend and a CT scan was ordered.  She has a small fluid collection under her left inguinal incision as well as some wall thickening of her gallbladder and a stone stuck in her gallbladder neck.  An [place] was ordered that reveals wall thickening and a stone in the neck of her gallbladder as well.  Her WBC is normal.  She is currently eating and denies N/V.  Her abdominal pain is currently better.  We have been asked to see her regarding the fluid collection in her abdomen.  ROS: See above, otherwise other systems negative  Objective: Vital signs in last 24 hours: Temp:  [98.1 F (36.7 C)-99 F (37.2 C)] 98.1 F (36.7 C) (05/03 0639) Pulse Rate:  [84-89] 89 (05/03 1050) Resp:  [14-23] 23 (05/03 0639) BP: (113-148)/(71-83) 148/83 (05/03 1050) SpO2:  [97 %-99 %] 97 % (05/03 0639) Weight:  [114.9 kg] 114.9 kg (05/03 0639) Last BM Date: 07/12/19  Intake/Output from previous day: 05/02 0701 - 05/03 0700 In: -  Out: 1350 [Urine:1350] Intake/Output this shift: No intake/output data recorded.  PE: Heart: regular Lungs: CTAB Abd: soft, +BS, minimal epigastric/central tender, but no Murphy's sign, ND, but obese. Skin: large wound on right side is clean with mepitel appearing dressing in place.  Lateral right edge is closed as well as left lateral edge.  No erythema or drainage noted from the left side.  Penrose drain in place.   Lab Results:  Recent Labs    07/13/19 0248 07/14/19 0500  WBC 7.8 8.8  HGB 8.1* 8.3*  HCT 26.7* 27.7*  PLT 437* 431*   BMET Recent Labs    07/13/19 0248  07/14/19 0500  NA 141 140  K 3.8 4.1  CL 107 104  CO2 27 27  GLUCOSE 103* 123*  BUN 7 5*  CREATININE 0.53 0.49  CALCIUM 8.0* 7.8*   PT/INR No results for input(s): LABPROT, INR in the last 72 hours. CMP     Component Value Date/Time   NA 140 07/14/2019 0500   K 4.1 07/14/2019 0500   CL 104 07/14/2019 0500   CO2 27 07/14/2019 0500   GLUCOSE 123 (H) 07/14/2019 0500   BUN 5 (L) 07/14/2019 0500   CREATININE 0.49 07/14/2019 0500   CALCIUM 7.8 (L) 07/14/2019 0500   PROT 5.0 (L) 07/10/2019 0544   ALBUMIN 1.2 (L) 07/10/2019 0544   AST 11 (L) 07/10/2019 0544   ALT 8 07/10/2019 0544   ALKPHOS 93 07/10/2019 0544   BILITOT 0.4 07/10/2019 0544   GFRNONAA >60 07/14/2019 0500   GFRAA >60 07/14/2019 0500   Lipase  No results found for: LIPASE     Studies/Results: CT ABDOMEN PELVIS W CONTRAST  Result Date: 07/13/2019 CLINICAL DATA:  Diffuse abdominal pain EXAM: CT ABDOMEN AND PELVIS WITH CONTRAST TECHNIQUE: Multidetector CT imaging of the abdomen and pelvis was performed using the standard protocol following bolus administration of intravenous contrast. CONTRAST:  132mL OMNIPAQUE IOHEXOL 300 MG/ML  SOLN COMPARISON:  06/23/2019 FINDINGS: Lower chest: There are small bilateral pleural effusions with adjacent atelectasis.The heart size is  normal. Hepatobiliary: The liver is normal. There is diffuse gallbladder wall thickening. A gallstone is noted. The gallbladder is mildly distended.There is no biliary ductal dilation. Pancreas: Normal contours without ductal dilatation. No peripancreatic fluid collection. Spleen: Unremarkable. Adrenals/Urinary Tract: --Adrenal glands: Unremarkable. --Right kidney/ureter: No hydronephrosis or radiopaque kidney stones. --Left kidney/ureter: No hydronephrosis or radiopaque kidney stones. --Urinary bladder: There is a Foley catheter in place. Stomach/Bowel: --Stomach/Duodenum: No hiatal hernia or other gastric abnormality. Normal duodenal course and caliber.  --Small bowel: Unremarkable. --Colon: Unremarkable. --Appendix: Normal. Vascular/Lymphatic: Normal course and caliber of the major abdominal vessels. There is a possible filling defect within the right common femoral vein (axial series 3, image 96). --No retroperitoneal lymphadenopathy. --No mesenteric lymphadenopathy. --reactive lymphadenopathy is noted within the patient's pelvis and bilateral inguinal regions. Reproductive: There is a large fibroid uterus. Other: No ascites or free air. The patient is status post prior debridement of the right inguinal region. A surgical drain is noted coursing from the patient's left inguinal region across the midline. Inferior to the surgical drain there is a 4 x 1.5 cm abscess in the subcutaneous fat overlying the low left inguinal region (axial series 3, image 98). Pockets of subcutaneous gas are noted in the patient's anterior abdominal wall and pannus which may be postsurgical or post infectious in etiology. There is skin thickening overlying the lateral right proximal thigh. Musculoskeletal. No acute displaced fractures. IMPRESSION: 1. Cholelithiasis with diffuse gallbladder wall thickening and a gallstone. Findings could represent acute cholecystitis in the appropriate clinical setting. If there is concern for acute cholecystitis, follow-up with ultrasound is recommended. 2. Possible filling defect within the right common femoral vein. Recommend correlation with lower extremity venous Doppler ultrasound to help exclude an underlying DVT. 3. Small bilateral pleural effusions with adjacent atelectasis. 4. Probable 3.5 cm abscess in the subcutaneous fat overlying the left inguinal region, inferior to the left lateral pro chin surgical drain. 5. Extensive postsurgical changes overlying the right inguinal region related to interval debridement. 6. Fibroid uterus. These results will be called to the ordering clinician or representative by the Radiologist Assistant, and  communication documented in the PACS or [employer]. Electronically Signed   By: Katherine Mantle M.D.   On: 07/13/2019 16:47   US Abdomen Limited RUQ  Result Date: 07/14/2019 CLINICAL DATA:  Abdominal pain EXAM: ULTRASOUND ABDOMEN LIMITED RIGHT UPPER QUADRANT COMPARISON:  07/13/2019 FINDINGS: Gallbladder: There is diffuse gallbladder wall thickening which measures 5.5 mm in maximum thickness. There is a stone within the neck of gallbladder measuring 2 cm. Gallbladder sludge is present. Positive sonographic Murphy's sign. Common bile duct: Diameter: 6.5 mm Liver: No focal lesion identified. Within normal limits in parenchymal echogenicity. Portal vein is patent on color Doppler imaging with normal direction of blood flow towards the liver. Other: None. IMPRESSION: 1. Diffuse gallbladder wall thickening with gallstone and positive sonographic Murphy's sign. Imaging findings are compatible with acute cholecystitis. 2. Small right pleural effusion. Electronically Signed   By: Signa Kell M.D.   On: 07/14/2019 11:34    Anti-infectives: Anti-infectives (From admission, onward)   Start     Dose/Rate Route Frequency Ordered Stop   07/12/19 1000  amoxicillin-clavulanate (AUGMENTIN) 875-125 MG per tablet 1 tablet     1 tablet Oral Every 12 hours 07/12/19 0740 07/19/19 0959   07/09/19 0200  clindamycin (CLEOCIN) IVPB 900 mg  Status:  Discontinued     900 mg 100 mL/hr over 30 Minutes Intravenous Every 8 hours 07/09/19 0034 07/11/19 1610  07/08/19 2245  vancomycin (VANCOCIN) IVPB 1000 mg/200 mL premix  Status:  Discontinued     1,000 mg 200 mL/hr over 60 Minutes Intravenous Every 12 hours 07/08/19 2231 07/12/19 0740   07/08/19 2245  ceFEPIme (MAXIPIME) 2 g in sodium chloride 0.9 % 100 mL IVPB  Status:  Discontinued     2 g 200 mL/hr over 30 Minutes Intravenous Every 8 hours 07/08/19 2231 07/12/19 0740   07/08/19 2215  ceFEPIme (MAXIPIME) 2 g in sodium chloride 0.9 % 100 mL IVPB  Status:   Discontinued     2 g 200 mL/hr over 30 Minutes Intravenous  Once 07/08/19 2210 07/09/19 0000   07/08/19 2215  metroNIDAZOLE (FLAGYL) IVPB 500 mg  Status:  Discontinued     500 mg 100 mL/hr over 60 Minutes Intravenous Every 8 hours 07/08/19 2210 07/09/19 0846   07/08/19 2215  vancomycin (VANCOCIN) IVPB 1000 mg/200 mL premix  Status:  Discontinued     1,000 mg 200 mL/hr over 60 Minutes Intravenous  Once 07/08/19 2210 07/09/19 0001   07/08/19 1115  ceFAZolin (ANCEF) IVPB 2g/100 mL premix     2 g 200 mL/hr over 30 Minutes Intravenous On call to O.R. 07/08/19 1104 07/08/19 1330   06/26/19 1200  Vancomycin (VANCOCIN) 1,500 mg in sodium chloride 0.9 % 500 mL IVPB  Status:  Discontinued     1,500 mg 250 mL/hr over 120 Minutes Intravenous Every 24 hours 06/25/19 1153 06/26/19 0455   06/26/19 1200  vancomycin (VANCOREADY) IVPB 1500 mg/300 mL  Status:  Discontinued     1,500 mg 150 mL/hr over 120 Minutes Intravenous Every 24 hours 06/26/19 0455 06/26/19 0730   06/26/19 0830  vancomycin (VANCOREADY) IVPB 1500 mg/300 mL  Status:  Discontinued     1,000 mg 100 mL/hr over 120 Minutes Intravenous Every 12 hours 06/26/19 0730 06/26/19 0741   06/26/19 0830  vancomycin (VANCOCIN) IVPB 1000 mg/200 mL premix  Status:  Discontinued     1,000 mg 200 mL/hr over 60 Minutes Intravenous Every 12 hours 06/26/19 0742 07/04/19 1238   06/25/19 1145  vancomycin (VANCOCIN) 500 mg in sodium chloride 0.9 % 100 mL IVPB     500 mg 100 mL/hr over 60 Minutes Intravenous NOW 06/25/19 1055 06/25/19 1241   06/24/19 1600  vancomycin (VANCOREADY) IVPB 750 mg/150 mL  Status:  Discontinued     750 mg 150 mL/hr over 60 Minutes Intravenous Every 12 hours 06/23/19 1700 06/24/19 0714   06/24/19 1600  vancomycin (VANCOCIN) IVPB 1000 mg/200 mL premix  Status:  Discontinued     1,000 mg 200 mL/hr over 60 Minutes Intravenous Every 12 hours 06/24/19 0714 06/24/19 0750   06/24/19 1400  clindamycin (CLEOCIN) IVPB 900 mg  Status:   Discontinued     900 mg 100 mL/hr over 30 Minutes Intravenous Every 8 hours 06/24/19 0756 06/29/19 1137   06/24/19 1145  ceFEPIme (MAXIPIME) 2 g in sodium chloride 0.9 % 100 mL IVPB  Status:  Discontinued     2 g 200 mL/hr over 30 Minutes Intravenous To ShortStay Surgical 06/24/19 1131 06/24/19 1457   06/24/19 1030  ceFEPIme (MAXIPIME) 2 g in sodium chloride 0.9 % 100 mL IVPB  Status:  Discontinued     2 g 200 mL/hr over 30 Minutes Intravenous Every 8 hours 06/24/19 0714 07/04/19 1238   06/24/19 0800  vancomycin (VANCOCIN) IVPB 1000 mg/200 mL premix  Status:  Discontinued     1,000 mg 200 mL/hr over 60 Minutes Intravenous Every  12 hours 06/24/19 0750 06/25/19 1153   06/24/19 0300  ceFEPIme (MAXIPIME) 2 g in sodium chloride 0.9 % 100 mL IVPB  Status:  Discontinued     2 g 200 mL/hr over 30 Minutes Intravenous Every 12 hours 06/23/19 1700 06/24/19 0714   06/23/19 1700  clindamycin (CLEOCIN) IVPB 600 mg  Status:  Discontinued     600 mg 100 mL/hr over 30 Minutes Intravenous Every 8 hours 06/23/19 1649 06/24/19 0756   06/23/19 1500  vancomycin (VANCOREADY) IVPB 2000 mg/400 mL     2,000 mg 200 mL/hr over 120 Minutes Intravenous  Once 06/23/19 1454 06/23/19 1747   06/23/19 1445  ceFEPIme (MAXIPIME) 2 g in sodium chloride 0.9 % 100 mL IVPB     2 g 200 mL/hr over 30 Minutes Intravenous  Once 06/23/19 1434 06/23/19 1557   06/23/19 1445  metroNIDAZOLE (FLAGYL) IVPB 500 mg     500 mg 100 mL/hr over 60 Minutes Intravenous  Once 06/23/19 1434 06/23/19 1608   06/23/19 1445  vancomycin (VANCOCIN) IVPB 1000 mg/200 mL premix  Status:  Discontinued     1,000 mg 200 mL/hr over 60 Minutes Intravenous  Once 06/23/19 1434 06/23/19 1440   06/23/19 1445  vancomycin (VANCOCIN) 2,000 mg in sodium chloride 0.9 % 500 mL IVPB  Status:  Discontinued     2,000 mg 250 mL/hr over 120 Minutes Intravenous  Once 06/23/19 1440 06/23/19 1454       Assessment/Plan T2DMwith DKA  Acute renal failure - Crnormal,  0.50 Hypokalemia  Schizophrenia Bipolar disorder ABL anemia -hgb 8.3 s/p 1U this AM. 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 Derrell Lolling - S/p debridement of abdominal and right thigh wound; closure of right abdominal wound and the left abdominal wound; and application of meshed primatrix AGto the right thigh covered by Mepitel 4/27 with Dr. Arita Miss Dr. Arita Miss of plastics. They would like the dressing to stay in place until Friday - Will defer wound care to them at this time - New findings on CT scan discussed with plastics today.  No indications for drainage or anything else at this point.  abx therapy for now.  Possible cholecystitis The patient has a normal WBC currently and her pain seems improved from this weekend.  [place] concerning for cholecystitis.  We will order a HIDA scan to eval cholecystitis as the patient does not want an operation if she can avoid one.  This will allow [place] to be able to better determine how to proceed with this.  XKP:VVZS mod, NPO p MN for HIDA tomorrow VTE: SCDs, chemical prophylaxis ok from our standpoint MO:LMBEML 4/12; Clinda 4/13 - 4/18 . Cefepime/Vanc 4/12- 4/23. Cefepime/Vanc/Clinda 4/27 >> completed.  Augmentin 5/1 --> Foley - in place; will defer timing of removal to plastics   LOS: 21 days    Letha Cape , [facility] 07/14/2019, 12:46 PM Please see Amion for pager number during day hours 7:00am-4:30pm or 7:00am -11:30am on weekends

## 2019-07-14 NOTE — Progress Notes (Signed)
PROGRESS NOTE    Analy Orozco  YQM:[id number] DOB: [birth year] DOA: 06/23/2019 PCP: Patient, No Pcp Per   Brief Narrative:  44 year old with history of DM2, schizophrenia, bipolar disorder admitted with DKA in the setting of necrotizing fasciitis of the right groin, left inguinal crease and right medial thigh.  She underwent debridement on 4/13 and then again on 4/15.  Initially intubated on 4/12, thereafter extubated on 4/16 and transferred out of the ICU on 4/17.  Thoracic surgery team was consulted, another debridement was performed on 4/27 with estimated blood loss 450 cc.  On 4/28 patient again became febrile, hypotensive oligoanuric requiring transfer to the ICU.  Has been receiving multiple course of antibiotics.  ID was consulted, antibiotics were consolidated down to Augmentin.   Assessment & Plan:   Active Problems:   DKA (diabetic ketoacidoses) (HCC)   Necrotizing cellulitis   Septic shock (HCC)   AKI (acute kidney injury) (HCC)   Acute respiratory failure (HCC)   Leukocytosis   Septic shock secondary to necrotizing fasciitis of abdominal wound, perineum and the right thigh Persistent abdominal pain -Shock physiology improved -Discontinue vancomycin/cefepime/vancomycin.  Now on Augmentin, total of 7 days.  Last day 5/7 -Continue to follow culture data. -Seen by plastic surgery-plastic surgery following. -Vitamin C, zinc, supplemental boost -Wound care per plastic surgery  Patient has Foley catheter in place to keep her lower abdominal and thigh wounds clean.  3.5 cm subcutaneous abscess in the left inguinal region -General surgery to see the patient again today to see if this needs any further management.  Cholelithiasis with gallbladder wall thickening; acute cholecystitis -Suspicion for acute cholecystitis.  Will obtain right upper quadrant ultrasound.  General surgery reconsulted  Urine leakage -CTA has ruled out any fistula.  We will flush the Foley catheter  and see if this helps again.  Previously it has not helped much.  Oligoanuric Anasarca/hypoalbuminemia -Encourage oral nutrition  Right common femoral vein filling defect -Suspect secondary to extensive surgery.  Prior will be difficult to obtain ultrasound of this region given the extent of problems.  Acute blood loss anemia, subsided for now -Requiring frequent transfusions 3 U so far (4/18; 4/25; 4/28).  Maintain hemoglobin greater than 7.  Sinus tachycardia -Resume her home propranolol  Diabetes mellitus type 2, fairly well controlled now -Sliding scale -Levemir 5 units daily  History of schizophrenia with bipolar disorder -Continue Haldol, valproate and Cogentin  Obesity with BMI greater than 35 -Weight loss diet and exercise  PT recommending CIR, but deemed to be too aggressive for the patient therefore will need SNF  DVT prophylaxis: SCDs Code Status: Full code Family Communication:    Status is: Inpatient  Remains inpatient appropriate because:Hemodynamically unstable.  Still getting wound care dressing, oral antibiotics and management for generalized abdominal pain   Dispo: The patient is from: Home              Anticipated d/c is to: SNF              Anticipated d/c date is: > 3 days              Patient currently is not medically stable to d/c.  Still need to address generalized abdominal pain concerns for cholecystitis, abscess on the left side  Subjective: Still having abdominal discomfort which is more diffuse.  Review of Systems Otherwise negative except as per HPI, including: General: Denies fever, chills, night sweats or unintended weight loss. Resp: Denies cough, wheezing, shortness of breath. Cardiac:  Denies chest pain, palpitations, orthopnea, paroxysmal nocturnal dyspnea. GI: Denies  nausea, vomiting, diarrhea or constipation GU: Denies dysuria, frequency, hesitancy or incontinence MS: Denies muscle aches, joint pain or swelling Neuro: Denies  headache, neurologic deficits (focal weakness, numbness, tingling), abnormal gait Psych: Denies anxiety, depression, SI/HI/AVH Skin: Denies new rashes or lesions ID: Denies sick contacts, exotic exposures, travel  Examination: Constitutional: Not in acute distress Respiratory: Clear to auscultation bilaterally Cardiovascular: Normal sinus rhythm, no rubs Abdomen: Nontender nondistended good bowel sounds Musculoskeletal: No edema noted Skin: Bilateral groin dressing noted Neurologic: CN 2-12 grossly intact.  And nonfocal Psychiatric: Normal judgment and insight. Alert and oriented x 3. Normal mood.  PICC line in place Foley catheter in place  Objective: Vitals:   07/13/19 1119 07/13/19 1431 07/13/19 1949 07/14/19 0639  BP: 121/66 129/81 113/71 131/74  Pulse: (!) 106 84 87 89  Resp:   14 (!) 23  Temp:  99 F (37.2 C) 98.6 F (37 C) 98.1 F (36.7 C)  TempSrc:  Oral Oral Oral  SpO2:  99% 98% 97%  Weight:    114.9 kg  Height:        Intake/Output Summary (Last 24 hours) at 07/14/2019 0832 Last data filed at 07/14/2019 0500 Gross per 24 hour  Intake --  Output 1350 ml  Net -1350 ml   Filed Weights   07/12/19 0648 07/13/19 0619 07/14/19 0639  Weight: 119.2 kg 117.2 kg 114.9 kg     Data Reviewed:   CBC: Recent Labs  Lab 07/10/19 0544 07/11/19 0500 07/12/19 0500 07/13/19 0248 07/14/19 0500  WBC 6.5 6.0 6.3 7.8 8.8  HGB 8.2* 7.6* 7.9* 8.1* 8.3*  HCT 26.2* 24.5* 26.2* 26.7* 27.7*  MCV 87.3 89.1 90.7 89.6 90.2  PLT 409* 410* 433* 437* 431*   Basic Metabolic Panel: Recent Labs  Lab 07/10/19 0544 07/11/19 0500 07/12/19 0500 07/13/19 0248 07/14/19 0500  NA 138 139 140 141 140  K 3.2* 3.3* 3.6 3.8 4.1  CL 105 107 107 107 104  CO2 24 26 26 27 27   GLUCOSE 131* 94 104* 103* 123*  BUN 10 9 9 7  5*  CREATININE 0.61 0.55 0.51 0.53 0.49  CALCIUM 7.3* 7.4* 7.7* 8.0* 7.8*  MG  --  1.4* 1.7 1.5* 1.7   GFR: Estimated Creatinine Clearance: 115.5 mL/min (by C-G formula  based on SCr of 0.49 mg/dL). Liver Function Tests: Recent Labs  Lab 07/10/19 0544  AST 11*  ALT 8  ALKPHOS 93  BILITOT 0.4  PROT 5.0*  ALBUMIN 1.2*   No results for input(s): LIPASE, AMYLASE in the last 168 hours. No results for input(s): AMMONIA in the last 168 hours. Coagulation Profile: No results for input(s): INR, PROTIME in the last 168 hours. Cardiac Enzymes: No results for input(s): CKTOTAL, CKMB, CKMBINDEX, TROPONINI in the last 168 hours. BNP (last 3 results) No results for input(s): PROBNP in the last 8760 hours. HbA1C: No results for input(s): HGBA1C in the last 72 hours. CBG: Recent Labs  Lab 07/13/19 0804 07/13/19 1216 07/13/19 1625 07/13/19 2131 07/14/19 0822  GLUCAP 92 107* 80 125* 113*   Lipid Profile: No results for input(s): CHOL, HDL, LDLCALC, TRIG, CHOLHDL, LDLDIRECT in the last 72 hours. Thyroid Function Tests: No results for input(s): TSH, T4TOTAL, FREET4, T3FREE, THYROIDAB in the last 72 hours. Anemia Panel: No results for input(s): VITAMINB12, FOLATE, FERRITIN, TIBC, IRON, RETICCTPCT in the last 72 hours. Sepsis Labs: Recent Labs  Lab 07/08/19 2220 07/09/19 0215  LATICACIDVEN 2.7* 2.4*  Recent Results (from the past 240 hour(s))  Culture, blood (x 2)     Status: None   Collection Time: 07/08/19 10:42 PM   Specimen: BLOOD  Result Value Ref Range Status   Specimen Description BLOOD RIGHT ARM  Final   Special Requests   Final    BOTTLES DRAWN AEROBIC AND ANAEROBIC Blood Culture results may not be optimal due to an excessive volume of blood received in culture bottles   Culture   Final    NO GROWTH 5 DAYS Performed at [facility], [place] [address] [place], [place] [address]    Report Status 07/13/2019 FINAL  Final  Culture, blood (x 2)     Status: None   Collection Time: 07/08/19 10:50 PM   Specimen: BLOOD  Result Value Ref Range Status   Specimen Description BLOOD RIGHT HAND  Final   Special Requests   Final    BOTTLES  DRAWN AEROBIC ONLY Blood Culture results may not be optimal due to an excessive volume of blood received in culture bottles   Culture   Final    NO GROWTH 5 DAYS Performed at [facility], [place] [address], [place], [place] [address]    Report Status 07/13/2019 FINAL  Final         Radiology Studies: CT ABDOMEN PELVIS W CONTRAST  Result Date: 07/13/2019 CLINICAL DATA:  Diffuse abdominal pain EXAM: CT ABDOMEN AND PELVIS WITH CONTRAST TECHNIQUE: Multidetector CT imaging of the abdomen and pelvis was performed using the standard protocol following bolus administration of intravenous contrast. CONTRAST:  168mL OMNIPAQUE IOHEXOL 300 MG/ML  SOLN COMPARISON:  06/23/2019 FINDINGS: Lower chest: There are small bilateral pleural effusions with adjacent atelectasis.The heart size is normal. Hepatobiliary: The liver is normal. There is diffuse gallbladder wall thickening. A gallstone is noted. The gallbladder is mildly distended.There is no biliary ductal dilation. Pancreas: Normal contours without ductal dilatation. No peripancreatic fluid collection. Spleen: Unremarkable. Adrenals/Urinary Tract: --Adrenal glands: Unremarkable. --Right kidney/ureter: No hydronephrosis or radiopaque kidney stones. --Left kidney/ureter: No hydronephrosis or radiopaque kidney stones. --Urinary bladder: There is a Foley catheter in place. Stomach/Bowel: --Stomach/Duodenum: No hiatal hernia or other gastric abnormality. Normal duodenal course and caliber. --Small bowel: Unremarkable. --Colon: Unremarkable. --Appendix: Normal. Vascular/Lymphatic: Normal course and caliber of the major abdominal vessels. There is a possible filling defect within the right common femoral vein (axial series 3, image 96). --No retroperitoneal lymphadenopathy. --No mesenteric lymphadenopathy. --reactive lymphadenopathy is noted within the patient's pelvis and bilateral inguinal regions. Reproductive: There is a large fibroid uterus. Other: No ascites or  free air. The patient is status post prior debridement of the right inguinal region. A surgical drain is noted coursing from the patient's left inguinal region across the midline. Inferior to the surgical drain there is a 4 x 1.5 cm abscess in the subcutaneous fat overlying the low left inguinal region (axial series 3, image 98). Pockets of subcutaneous gas are noted in the patient's anterior abdominal wall and pannus which may be postsurgical or post infectious in etiology. There is skin thickening overlying the lateral right proximal thigh. Musculoskeletal. No acute displaced fractures. IMPRESSION: 1. Cholelithiasis with diffuse gallbladder wall thickening and a gallstone. Findings could represent acute cholecystitis in the appropriate clinical setting. If there is concern for acute cholecystitis, follow-up with ultrasound is recommended. 2. Possible filling defect within the right common femoral vein. Recommend correlation with lower extremity venous Doppler ultrasound to help exclude an underlying DVT. 3. Small bilateral pleural effusions  with adjacent atelectasis. 4. Probable 3.5 cm abscess in the subcutaneous fat overlying the left inguinal region, inferior to the left lateral pro chin surgical drain. 5. Extensive postsurgical changes overlying the right inguinal region related to interval debridement. 6. Fibroid uterus. These results will be called to the ordering clinician or representative by the Radiologist Assistant, and communication documented in the PACS or [employer]. Electronically Signed   By: Katherine Mantle M.D.   On: 07/13/2019 16:47        Scheduled Meds: . sodium chloride   Intravenous Once  . acetaminophen  1,000 mg Oral Q6H  . amoxicillin-clavulanate  1 tablet Oral Q12H  . vitamin C  500 mg Oral BID  . benztropine  1 mg Oral BID  . chlorhexidine  15 mL Mouth Rinse BID  . Chlorhexidine Gluconate Cloth  6 each Topical Daily  . feeding supplement  1 Container Oral TID BM    . feeding supplement (GLUCERNA SHAKE)  237 mL Oral TID BM  . Gerhardt's butt cream   Topical BID  . haloperidol  10 mg Oral q morning - 10a  . haloperidol  20 mg Oral QHS  . insulin aspart  0-5 Units Subcutaneous QHS  . insulin aspart  0-9 Units Subcutaneous TID WC  . insulin detemir  5 Units Subcutaneous Daily  . mouth rinse  15 mL Mouth Rinse q12n4p  . multivitamin with minerals  1 tablet Oral Daily  . nutrition supplement (JUVEN)  1 packet Oral BID BM  . pantoprazole  40 mg Oral Daily  . propranolol ER  80 mg Oral Daily  . sodium chloride flush  10-40 mL Intracatheter Q12H  . sodium chloride flush  10-40 mL Intracatheter Q12H  . temazepam  30 mg Oral QHS  . valproic acid  250 mg Oral BID  . zinc sulfate  220 mg Oral Daily   Continuous Infusions: . sodium chloride 10 mL/hr at 07/10/19 0626  . lactated ringers 125 mL/hr at 07/10/19 0648     LOS: 21 days   Time spent= 35 mins    Kimberly Orozco Joline Maxcy, MD [facility]  If 7PM-7AM, please contact night-coverage  07/14/2019, 8:32 AM

## 2019-07-14 NOTE — Progress Notes (Signed)
Nutrition Follow-up  DOCUMENTATION CODES:   Obesity unspecified  INTERVENTION:   D/C Boost Breeze po TID, patient refusing.  D/C Glucerna Shake po TID, patient refusing.  Continue Juven BID, each packet provides 80-95 calories, 8 grams of carbohydrate, 2.5  grams of protein (collagen), 7 grams of L-arginine and 7 grams of L-glutamine; supplement contains CaHMB, Vitamins C, E, B12 and Zinc to promote wound healing.  Continue vanilla Magic cup TID with meals, each supplement provides 290 kcal and 9 grams of protein  Continue vanilla Vital Cuisine Shake TID, each supplement provides 520 kcal and 22 grams of protein  Continue MVI, vitamin C, and zinc.   NUTRITION DIAGNOSIS:   Increased nutrient needs related to wound healing, acute illness as evidenced by estimated needs; ongoing  GOAL:   Patient will meet greater than or equal to 90% of their needs; progressing  MONITOR:   PO intake, Supplement acceptance, Labs, Weight trends, Skin, I & O's  ASSESSMENT:   44 yo female admitted with DKA, necrotizing soft tissue infection of lower abdominal wall and upper thigh extending to perineum, septic shock. PMH includes DM, bipolar disorder, schizophrenia.  S/P debridement of abdominal and right thigh wound; closure of right abdominal wound and left abdominal wound 4/27.  Patient complains that the Boost Breeze and Glucerna Shake supplements give her diarrhea. She does not like Nepro or Ensure supplements. She has been accepting the Juven supplements BID. She also receives Magic cup and Vital Cuisine shakes with meals.   PO intake is variable, but patient says she has been eating better. 0-100% meal completion per documentation. She ate 2 pieces of bacon and drank juice for breakfast.   Labs reviewed.  CBG's: 113-122  Medications reviewed and include vitamin C and zinc, novolog, Levemir, MVI with minerals.  Current weight 114.9 kg. Admission weight 108.6 kg.  Diet Order:   Diet  Order            Diet Carb Modified Fluid consistency: Thin; Room service appropriate? Yes  Diet effective now              EDUCATION NEEDS:   Not appropriate for education at this time  Skin:  Skin Assessment: Skin Integrity Issues: Skin Integrity Issues:: Unstageable, Other (Comment) Unstageable: N/A Other: Necrotizing wound to lower abdominal wall and upper thigh extending to perineum  Last BM:  5/1  Height:   Ht Readings from Last 1 Encounters:  07/10/19 5\' 6"  (1.676 m)    Weight:   Wt Readings from Last 1 Encounters:  07/14/19 114.9 kg    BMI:  Body mass index is 40.9 kg/m.  Estimated Nutritional Needs:   Kcal:  2400-2700 kcals  Protein:  120-150 g  Fluid:  >/= 2 L    09/13/19, RD, LDN, CNSC Please refer to Amion for contact information.

## 2019-07-14 NOTE — Care Management (Signed)
07-14-19 Patient will not be a candidate for LTAC- patient has Medicare A only and no part B coverage. Graves-Bigelow, Lamar Laundry, RN, BSN Case Manager

## 2019-07-14 NOTE — Progress Notes (Signed)
PT Cancellation Note  Patient Details Name: Kimberly Orozco MRN: [id number] DOB: Jul 04, 1975   Cancelled Treatment:    Reason Eval/Treat Not Completed: Patient at procedure or test/unavailableWill re-attempt later if time allows.    Rube Sanchez 07/14/2019, 10:29 AM

## 2019-07-14 NOTE — Progress Notes (Signed)
6 Days Post-Op  Subjective: Patient is a 44 yr-old female who underwent debridement of abdominal and right thigh wound, closure of right and left abdominal wounds and application of meshed Primatrix to the right thigh covered by Mepitel on 07/08/19 with Dr. Arita Miss.  Today she is lying in bed getting a bath by nursing. Changed dressing with RN. Patient reports her abdominal pain has resolved. Reports feeling sore. Right and left incisions intact. Left drain in place. No signs of infection.  Good pink tissue observed under Mepitel on right thigh. All wounds producing significant drainage. Nursing is doing a great job changing dressing multiple times a day to keep wounds well cared for.     Objective: Vital signs in last 24 hours: Temp:  [98.1 F (36.7 C)-98.6 F (37 C)] 98.1 F (36.7 C) (05/03 0639) Pulse Rate:  [87-89] 89 (05/03 1050) Resp:  [14-23] 23 (05/03 0639) BP: (113-148)/(71-83) 148/83 (05/03 1050) SpO2:  [97 %-98 %] 97 % (05/03 0639) Weight:  [114.9 kg] 114.9 kg (05/03 0639) Last BM Date: 07/12/19  Intake/Output from previous day: 05/02 0701 - 05/03 0700 In: -  Out: 1350 [Urine:1350] Intake/Output this shift: No intake/output data recorded.  General appearance: alert, cooperative and no distress Head: Normocephalic, without obvious abnormality, atraumatic Eyes: EOMs intact Resp: nonlabored Incision/Wound: Right and left abdominal insions are intact. Left drain in place. Mepitel in place on right thigh wound with good pink tissue visible under mesh. No signs of infection. Drainage present.   Lab Results:  @LABLAST2 (wbc:2,hgb:2,hct:2,plt:2) BMET Recent Labs    07/13/19 0248 07/14/19 0500  NA 141 140  K 3.8 4.1  CL 107 104  CO2 27 27  GLUCOSE 103* 123*  BUN 7 5*  CREATININE 0.53 0.49  CALCIUM 8.0* 7.8*   PT/INR No results for input(s): LABPROT, INR in the last 72 hours. ABG No results for input(s): PHART, HCO3 in the last 72 hours.  Invalid input(s): PCO2,  PO2  Studies/Results: CT ABDOMEN PELVIS W CONTRAST  Result Date: 07/13/2019 CLINICAL DATA:  Diffuse abdominal pain EXAM: CT ABDOMEN AND PELVIS WITH CONTRAST TECHNIQUE: Multidetector CT imaging of the abdomen and pelvis was performed using the standard protocol following bolus administration of intravenous contrast. CONTRAST:  09/12/2019 OMNIPAQUE IOHEXOL 300 MG/ML  SOLN COMPARISON:  06/23/2019 FINDINGS: Lower chest: There are small bilateral pleural effusions with adjacent atelectasis.The heart size is normal. Hepatobiliary: The liver is normal. There is diffuse gallbladder wall thickening. A gallstone is noted. The gallbladder is mildly distended.There is no biliary ductal dilation. Pancreas: Normal contours without ductal dilatation. No peripancreatic fluid collection. Spleen: Unremarkable. Adrenals/Urinary Tract: --Adrenal glands: Unremarkable. --Right kidney/ureter: No hydronephrosis or radiopaque kidney stones. --Left kidney/ureter: No hydronephrosis or radiopaque kidney stones. --Urinary bladder: There is a Foley catheter in place. Stomach/Bowel: --Stomach/Duodenum: No hiatal hernia or other gastric abnormality. Normal duodenal course and caliber. --Small bowel: Unremarkable. --Colon: Unremarkable. --Appendix: Normal. Vascular/Lymphatic: Normal course and caliber of the major abdominal vessels. There is a possible filling defect within the right common femoral vein (axial series 3, image 96). --No retroperitoneal lymphadenopathy. --No mesenteric lymphadenopathy. --reactive lymphadenopathy is noted within the patient's pelvis and bilateral inguinal regions. Reproductive: There is a large fibroid uterus. Other: No ascites or free air. The patient is status post prior debridement of the right inguinal region. A surgical drain is noted coursing from the patient's left inguinal region across the midline. Inferior to the surgical drain there is a 4 x 1.5 cm abscess in the subcutaneous fat overlying the  low left  inguinal region (axial series 3, image 98). Pockets of subcutaneous gas are noted in the patient's anterior abdominal wall and pannus which may be postsurgical or post infectious in etiology. There is skin thickening overlying the lateral right proximal thigh. Musculoskeletal. No acute displaced fractures. IMPRESSION: 1. Cholelithiasis with diffuse gallbladder wall thickening and a gallstone. Findings could represent acute cholecystitis in the appropriate clinical setting. If there is concern for acute cholecystitis, follow-up with ultrasound is recommended. 2. Possible filling defect within the right common femoral vein. Recommend correlation with lower extremity venous Doppler ultrasound to help exclude an underlying DVT. 3. Small bilateral pleural effusions with adjacent atelectasis. 4. Probable 3.5 cm abscess in the subcutaneous fat overlying the left inguinal region, inferior to the left lateral pro chin surgical drain. 5. Extensive postsurgical changes overlying the right inguinal region related to interval debridement. 6. Fibroid uterus. These results will be called to the ordering clinician or representative by the Radiologist Assistant, and communication documented in the PACS or [employer]. Electronically Signed   By: Katherine Mantle M.D.   On: 07/13/2019 16:47   US Abdomen Limited RUQ  Result Date: 07/14/2019 CLINICAL DATA:  Abdominal pain EXAM: ULTRASOUND ABDOMEN LIMITED RIGHT UPPER QUADRANT COMPARISON:  07/13/2019 FINDINGS: Gallbladder: There is diffuse gallbladder wall thickening which measures 5.5 mm in maximum thickness. There is a stone within the neck of gallbladder measuring 2 cm. Gallbladder sludge is present. Positive sonographic Murphy's sign. Common bile duct: Diameter: 6.5 mm Liver: No focal lesion identified. Within normal limits in parenchymal echogenicity. Portal vein is patent on color Doppler imaging with normal direction of blood flow towards the liver. Other: None.  IMPRESSION: 1. Diffuse gallbladder wall thickening with gallstone and positive sonographic Murphy's sign. Imaging findings are compatible with acute cholecystitis. 2. Small right pleural effusion. Electronically Signed   By: Signa Kell M.D.   On: 07/14/2019 11:34    Anti-infectives: Anti-infectives (From admission, onward)   Start     Dose/Rate Route Frequency Ordered Stop   07/12/19 1000  amoxicillin-clavulanate (AUGMENTIN) 875-125 MG per tablet 1 tablet     1 tablet Oral Every 12 hours 07/12/19 0740 07/19/19 0959   07/09/19 0200  clindamycin (CLEOCIN) IVPB 900 mg  Status:  Discontinued     900 mg 100 mL/hr over 30 Minutes Intravenous Every 8 hours 07/09/19 0034 07/11/19 0812   07/08/19 2245  vancomycin (VANCOCIN) IVPB 1000 mg/200 mL premix  Status:  Discontinued     1,000 mg 200 mL/hr over 60 Minutes Intravenous Every 12 hours 07/08/19 2231 07/12/19 0740   07/08/19 2245  ceFEPIme (MAXIPIME) 2 g in sodium chloride 0.9 % 100 mL IVPB  Status:  Discontinued     2 g 200 mL/hr over 30 Minutes Intravenous Every 8 hours 07/08/19 2231 07/12/19 0740   07/08/19 2215  ceFEPIme (MAXIPIME) 2 g in sodium chloride 0.9 % 100 mL IVPB  Status:  Discontinued     2 g 200 mL/hr over 30 Minutes Intravenous  Once 07/08/19 2210 07/09/19 0000   07/08/19 2215  metroNIDAZOLE (FLAGYL) IVPB 500 mg  Status:  Discontinued     500 mg 100 mL/hr over 60 Minutes Intravenous Every 8 hours 07/08/19 2210 07/09/19 0846   07/08/19 2215  vancomycin (VANCOCIN) IVPB 1000 mg/200 mL premix  Status:  Discontinued     1,000 mg 200 mL/hr over 60 Minutes Intravenous  Once 07/08/19 2210 07/09/19 0001   07/08/19 1115  ceFAZolin (ANCEF) IVPB 2g/100 mL premix  2 g 200 mL/hr over 30 Minutes Intravenous On call to O.R. 07/08/19 1104 07/08/19 1330   06/26/19 1200  Vancomycin (VANCOCIN) 1,500 mg in sodium chloride 0.9 % 500 mL IVPB  Status:  Discontinued     1,500 mg 250 mL/hr over 120 Minutes Intravenous Every 24 hours 06/25/19 1153  06/26/19 0455   06/26/19 1200  vancomycin (VANCOREADY) IVPB 1500 mg/300 mL  Status:  Discontinued     1,500 mg 150 mL/hr over 120 Minutes Intravenous Every 24 hours 06/26/19 0455 06/26/19 0730   06/26/19 0830  vancomycin (VANCOREADY) IVPB 1500 mg/300 mL  Status:  Discontinued     1,000 mg 100 mL/hr over 120 Minutes Intravenous Every 12 hours 06/26/19 0730 06/26/19 0741   06/26/19 0830  vancomycin (VANCOCIN) IVPB 1000 mg/200 mL premix  Status:  Discontinued     1,000 mg 200 mL/hr over 60 Minutes Intravenous Every 12 hours 06/26/19 0742 07/04/19 1238   06/25/19 1145  vancomycin (VANCOCIN) 500 mg in sodium chloride 0.9 % 100 mL IVPB     500 mg 100 mL/hr over 60 Minutes Intravenous NOW 06/25/19 1055 06/25/19 1241   06/24/19 1600  vancomycin (VANCOREADY) IVPB 750 mg/150 mL  Status:  Discontinued     750 mg 150 mL/hr over 60 Minutes Intravenous Every 12 hours 06/23/19 1700 06/24/19 0714   06/24/19 1600  vancomycin (VANCOCIN) IVPB 1000 mg/200 mL premix  Status:  Discontinued     1,000 mg 200 mL/hr over 60 Minutes Intravenous Every 12 hours 06/24/19 0714 06/24/19 0750   06/24/19 1400  clindamycin (CLEOCIN) IVPB 900 mg  Status:  Discontinued     900 mg 100 mL/hr over 30 Minutes Intravenous Every 8 hours 06/24/19 0756 06/29/19 1137   06/24/19 1145  ceFEPIme (MAXIPIME) 2 g in sodium chloride 0.9 % 100 mL IVPB  Status:  Discontinued     2 g 200 mL/hr over 30 Minutes Intravenous To [facility] 06/24/19 1131 06/24/19 1457   06/24/19 1030  ceFEPIme (MAXIPIME) 2 g in sodium chloride 0.9 % 100 mL IVPB  Status:  Discontinued     2 g 200 mL/hr over 30 Minutes Intravenous Every 8 hours 06/24/19 0714 07/04/19 1238   06/24/19 0800  vancomycin (VANCOCIN) IVPB 1000 mg/200 mL premix  Status:  Discontinued     1,000 mg 200 mL/hr over 60 Minutes Intravenous Every 12 hours 06/24/19 0750 06/25/19 1153   06/24/19 0300  ceFEPIme (MAXIPIME) 2 g in sodium chloride 0.9 % 100 mL IVPB  Status:  Discontinued     2  g 200 mL/hr over 30 Minutes Intravenous Every 12 hours 06/23/19 1700 06/24/19 0714   06/23/19 1700  clindamycin (CLEOCIN) IVPB 600 mg  Status:  Discontinued     600 mg 100 mL/hr over 30 Minutes Intravenous Every 8 hours 06/23/19 1649 06/24/19 0756   06/23/19 1500  vancomycin (VANCOREADY) IVPB 2000 mg/400 mL     2,000 mg 200 mL/hr over 120 Minutes Intravenous  Once 06/23/19 1454 06/23/19 1747   06/23/19 1445  ceFEPIme (MAXIPIME) 2 g in sodium chloride 0.9 % 100 mL IVPB     2 g 200 mL/hr over 30 Minutes Intravenous  Once 06/23/19 1434 06/23/19 1557   06/23/19 1445  metroNIDAZOLE (FLAGYL) IVPB 500 mg     500 mg 100 mL/hr over 60 Minutes Intravenous  Once 06/23/19 1434 06/23/19 1608   06/23/19 1445  vancomycin (VANCOCIN) IVPB 1000 mg/200 mL premix  Status:  Discontinued     1,000 mg 200  mL/hr over 60 Minutes Intravenous  Once 06/23/19 1434 06/23/19 1440   06/23/19 1445  vancomycin (VANCOCIN) 2,000 mg in sodium chloride 0.9 % 500 mL IVPB  Status:  Discontinued     2,000 mg 250 mL/hr over 120 Minutes Intravenous  Once 06/23/19 1440 06/23/19 1454      Assessment/Plan: s/p Procedure(s): Debridement of abdominal and groin wound with placement of primatrix AG Continue with dressing changes as needed (minimum once a day): Right thigh: thin layer of surgical lube to cover mepitel mesh, cover with kerlix or other absorbant gause and ABDs, secure with tape. (Mepitel should be moist, but not soaking wet.) Right and left abdominal incisions: Cover with Zeroform and ABD, secure with tape.     Tentative plan for split thickness skin graft to right thigh wound on 5/11.  Discussed CT findings with General Surgery; no indications for drainage or other surgical intervention at this time. Agree with Abx therapy for now.  LOS: 21 days    Eldridge Abrahams, PA-C 07/14/2019

## 2019-07-15 ENCOUNTER — Inpatient Hospital Stay (HOSPITAL_COMMUNITY): Payer: Medicare Other

## 2019-07-15 LAB — BASIC METABOLIC PANEL
Anion gap: 7 (ref 5–15)
BUN: 8 mg/dL (ref 6–20)
CO2: 29 mmol/L (ref 22–32)
Calcium: 8 mg/dL — ABNORMAL LOW (ref 8.9–10.3)
Chloride: 105 mmol/L (ref 98–111)
Creatinine, Ser: 0.46 mg/dL (ref 0.44–1.00)
GFR calc Af Amer: >60 mL/min (ref >60)
GFR calc non Af Amer: >60 mL/min (ref >60)
Glucose, Bld: 122 mg/dL — ABNORMAL HIGH (ref 70–99)
Potassium: 3.9 mmol/L (ref 3.5–5.1)
Sodium: 141 mmol/L (ref 135–145)

## 2019-07-15 LAB — CBC
HCT: 28.5 % — ABNORMAL LOW (ref 36.0–46.0)
Hemoglobin: 8.6 g/dL — ABNORMAL LOW (ref 12.0–15.0)
MCH: 27.9 pg (ref 26.0–34.0)
MCHC: 30.2 g/dL (ref 30.0–36.0)
MCV: 92.5 fL (ref 80.0–100.0)
Platelets: 448 10*3/uL — ABNORMAL HIGH (ref 150–400)
RBC: 3.08 MIL/uL — ABNORMAL LOW (ref 3.87–5.11)
RDW: 17.1 % — ABNORMAL HIGH (ref 11.5–15.5)
WBC: 10.6 10*3/uL — ABNORMAL HIGH (ref 4.0–10.5)
nRBC: 0.2 % (ref 0.0–0.2)

## 2019-07-15 LAB — HEPATIC FUNCTION PANEL
ALT: 10 U/L (ref 0–44)
AST: 14 U/L — ABNORMAL LOW (ref 15–41)
Albumin: 1.5 g/dL — ABNORMAL LOW (ref 3.5–5.0)
Alkaline Phosphatase: 82 U/L (ref 38–126)
Bilirubin, Direct: <0.1 mg/dL (ref 0.0–0.2)
Total Bilirubin: 0.4 mg/dL (ref 0.3–1.2)
Total Protein: 5.5 g/dL — ABNORMAL LOW (ref 6.5–8.1)

## 2019-07-15 LAB — GLUCOSE, CAPILLARY
Glucose-Capillary: 106 mg/dL — ABNORMAL HIGH (ref 70–99)
Glucose-Capillary: 172 mg/dL — ABNORMAL HIGH (ref 70–99)
Glucose-Capillary: 107 mg/dL — ABNORMAL HIGH (ref 70–99)
Glucose-Capillary: 92 mg/dL (ref 70–99)
Glucose-Capillary: 91 mg/dL (ref 70–99)

## 2019-07-15 LAB — MAGNESIUM: Magnesium: 1.4 mg/dL — ABNORMAL LOW (ref 1.7–2.4)

## 2019-07-15 MED ORDER — MORPHINE SULFATE (PF) 4 MG/ML IV SOLN
INTRAVENOUS | Status: AC
  Filled 2019-07-15: qty 1

## 2019-07-15 MED ORDER — MORPHINE SULFATE (PF) 4 MG/ML IV SOLN
3.0000 mg | Freq: Once | INTRAVENOUS | Status: AC
  Administered 2019-07-15: 3 mg via INTRAVENOUS

## 2019-07-15 MED ORDER — TECHNETIUM TC 99M MEBROFENIN IV KIT
5.0000 | PACK | Freq: Once | INTRAVENOUS | Status: AC | PRN
  Administered 2019-07-15: 5 via INTRAVENOUS

## 2019-07-15 MED ORDER — MAGNESIUM SULFATE 4 GM/100ML IV SOLN
4.0000 g | Freq: Once | INTRAVENOUS | Status: AC
  Administered 2019-07-15: 4 g via INTRAVENOUS
  Filled 2019-07-15: qty 100

## 2019-07-15 NOTE — Progress Notes (Signed)
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 Patient had extensive debridement by surgery 06/24/19 and developed shock requiring pressors. Repeat debridement 06/26/19, no longer requiring pressors and extubated 06/27/19. BKA resolved and patient transferred to hospitalist service 4/18, PCU 4/19. Plastic surgery consulted. Patient declining surgery at this time. PMH: schizophrenia, bipolar disorder, obesity and T2DM    PT Comments    Patient received in bed, agrees to PT session. She required min assist for supine to sit and required min/mod assist with sit to stand transfer with elevation of bed. Ambulation of 30 feet with RW. Fair safety awareness, cues needed for safe use of RW and negotiation of obstacles in room. After ambulation she reports she feels like her legs are heavy. She will continue to benefit from skilled PT while here to improve strength, safety and functional independence.     Follow Up Recommendations  SNF;Supervision for mobility/OOB     Equipment Recommendations  Rolling walker with 5" wheels;3in1 (PT);Hospital bed;Other (comment)    Recommendations for Other Services       Precautions / Restrictions Precautions Precautions: Fall Precaution Comments: L PICC, HOB>30 degrees catheter, abdominal wound Restrictions Weight Bearing Restrictions: No    Mobility  Bed Mobility Overal bed mobility: Needs Assistance Bed Mobility: Supine to Sit     Supine to sit: Min assist     General bed mobility comments: requires assist for moving LEs out of bed and assist to raise trunk to seated position  Transfers Overall transfer level: Needs assistance Equipment used: Rolling walker (2  wheeled) Transfers: Sit to/from Stand Sit to Stand: Min assist Stand pivot transfers: From elevated surface       General transfer comment: Attempted to stand from low bed initially x 2 attempts and was unable to get to standing, with bed slightly elevated she was able to get to standing  Ambulation/Gait Ambulation/Gait assistance: Min guard Gait Distance (Feet): 30 Feet Assistive device: Rolling walker (2 wheeled) Gait Pattern/deviations: Step-to pattern;Decreased stride length Gait velocity: decreased   General Gait Details: Patient ambulated to door and then to recliner. Did not want to walk further at this time. Cues for safety with use of AD.   Stairs             Wheelchair Mobility    Modified Rankin (Stroke Patients Only)       Balance Overall balance assessment: Needs assistance Sitting-balance support: Feet supported Sitting balance-Leahy Scale: Good     Standing balance support: Bilateral upper extremity supported;During functional activity Standing balance-Leahy Scale: Fair Standing balance comment: Pt can stand statically without UE support briefly.  Pt dynamic stance with min guard assist to min assist.                             Cognition Arousal/Alertness: Awake/alert Behavior During Therapy: WFL for tasks assessed/performed Overall Cognitive Status: Within Functional Limits for tasks assessed                                 General Comments: Lethargic at first but once started moving, alert and awake      Exercises  General Comments        Pertinent Vitals/Pain Pain Assessment: Faces Faces Pain Scale: Hurts a little bit Pain Location: abdomen Pain Descriptors / Indicators: Discomfort Pain Intervention(s): Monitored during session    Home Living                      Prior Function            PT Goals (current goals can now be found in the care plan section) Acute Rehab PT Goals Patient  Stated Goal: to do things for herself PT Goal Formulation: With patient Time For Goal Achievement: 07/29/19 Potential to Achieve Goals: Fair Progress towards PT goals: Progressing toward goals    Frequency    Min 3X/week      PT Plan Current plan remains appropriate    Co-evaluation              AM-PAC PT "6 Clicks" Mobility   Outcome Measure  Help needed turning from your back to your side while in a flat bed without using bedrails?: A Lot Help needed moving from lying on your back to sitting on the side of a flat bed without using bedrails?: A Little Help needed moving to and from a bed to a chair (including a wheelchair)?: A Little Help needed standing up from a chair using your arms (e.g., wheelchair or bedside chair)?: A Little Help needed to walk in hospital room?: A Little Help needed climbing 3-5 steps with a railing? : A Lot 6 Click Score: 16    End of Session Equipment Utilized During Treatment: Gait belt Activity Tolerance: Patient limited by fatigue Patient left: in chair;with chair alarm set;with call bell/phone within reach Nurse Communication: Mobility status PT Visit Diagnosis: Other abnormalities of gait and mobility (R26.89);Muscle weakness (generalized) (M62.81);Pain Pain - part of body: (abdomen)     Time: 1030-1046 PT Time Calculation (min) (ACUTE ONLY): 16 min  Charges:  $Gait Training: 8-22 mins                     Anyeli Hockenbury, PT, GCS 07/15/19,12:12 PM

## 2019-07-15 NOTE — Progress Notes (Signed)
Patient taken to nuclear medicine department at 0710hrs, returned to 6E at 0946hrs.

## 2019-07-15 NOTE — Progress Notes (Signed)
PROGRESS NOTE    Kimberly Orozco  ION:[id number] DOB: [birth year] DOA: 06/23/2019 PCP: Patient, No Pcp Per   Brief Narrative:  44 year old with history of DM2, schizophrenia, bipolar disorder admitted with DKA in the setting of necrotizing fasciitis of the right groin, left inguinal crease and right medial thigh.  She underwent debridement on 4/13 and then again on 4/15.  Initially intubated on 4/12, thereafter extubated on 4/16 and transferred out of the ICU on 4/17.  Thoracic surgery team was consulted, another debridement was performed on 4/27 with estimated blood loss 450 cc.  On 4/28 patient again became febrile, hypotensive oligoanuric requiring transfer to the ICU.  Has been receiving multiple course of antibiotics.  ID was consulted, antibiotics were consolidated down to Augmentin. Due to Abd pain, repeat Ab/Pelvis was performed which showed small abscess in the left ground and concerns of acute cholecystitis. Gen surgery reconsulted, RUQ [place] confirmed Acute Cholecystitis, HIDA scan done - results pending.    Assessment & Plan:   Active Problems:   DKA (diabetic ketoacidoses) (HCC)   Necrotizing cellulitis   Septic shock (HCC)   AKI (acute kidney injury) (HCC)   Acute respiratory failure (HCC)   Leukocytosis   Septic shock secondary to necrotizing fasciitis of abdominal wound, perineum and the right thigh Persistent abdominal pain -Shock physiology improved -Per ID on Augmentin, last day 5/7 -Continue to follow culture data. -Seen by plastic surgery-plastic surgery following. -Vitamin C, zinc, supplemental boost -Wound care per plastic surgery  Patient has Foley catheter in place to keep her lower abdominal and thigh wounds clean.  3.5 cm subcutaneous abscess in the left inguinal region -General surgery- conservative management for now.   Cholelithiasis with gallbladder wall thickening; acute cholecystitis -Suspicion for acute cholecystitis.  Confirmed on RUQ [place]. HIDA scan  done- results pending. Gen Surgery following.   Urine leakage; improved.  -CT A/P = no fistula seen.   Oligoanuric Anasarca/hypoalbuminemia -Encourage oral nutrition  Right common femoral vein filling defect -Suspect secondary to extensive surgery.  Prior will be difficult to obtain ultrasound of this region given the extent of problems.  Acute blood loss anemia, subsided for now -Requiring frequent transfusions 3 U so far (4/18; 4/25; 4/28).  Maintain hemoglobin greater than 7.  Sinus tachycardia; resolved  -resolved with resumption of Propranolol  Diabetes mellitus type 2, fairly well controlled now -Sliding scale -Levemir 5 units daily  History of schizophrenia with bipolar disorder -Continue Haldol, valproate and Cogentin  Obesity with BMI greater than 35 -Weight loss diet and exercise  PT recommending CIR, but deemed to be too aggressive for the patient therefore will need SNF  DVT prophylaxis: SCDs Code Status: Full code Family Communication:    Status is: Inpatient  Remains inpatient appropriate because:Hemodynamically unstable.  Still getting wound care dressing, oral antibiotics and management for generalized abdominal pain   Dispo: The patient is from: Home              Anticipated d/c is to: SNF              Anticipated d/c date is: > 3 days              Patient currently is not medically stable to d/c.  Still need to address generalized abdominal pain concerns for cholecystitis, abscess on the left side  Subjective: Still has some diffuse abd pain. No other complaints.   Review of Systems Otherwise negative except as per HPI, including: General = no fevers, chills, dizziness,  fatigue HEENT/EYES = negative for loss of vision, double vision, blurred vision,  sore throa Cardiovascular= negative for chest pain, palpitation Respiratory/lungs= negative for shortness of breath, cough, wheezing; hemoptysis,  Gastrointestinal= negative for nausea, vomiting,   Genitourinary= negative for Dysuria MSK = Negative for arthralgia, myalgias Neurology= Negative for headache, numbness, tingling  Psychiatry= Negative for suicidal and homocidal ideation Skin= Negative for Rash   Examination: Constitutional: Not in acute distress Respiratory: Clear to auscultation bilaterally Cardiovascular: Normal sinus rhythm, no rubs Abdomen: Nontender nondistended good bowel sounds Musculoskeletal: No edema noted Skin: b/l groin dressing notes.  Neurologic: CN 2-12 grossly intact.  And nonfocal Psychiatric: Normal judgment and insight. Alert and oriented x 3. Normal mood.    PICC line in place Foley catheter in place  Objective: Vitals:   07/14/19 1552 07/14/19 2020 07/15/19 0500 07/15/19 0936  BP: 136/80 116/70 120/71 130/80  Pulse: 90 94 89 84  Resp:  (!) 23 20 20   Temp: 98.4 F (36.9 C) 98.1 F (36.7 C) 98.6 F (37 C)   TempSrc: Oral Oral Oral   SpO2: 100% 97% 99% 98%  Weight:   114.5 kg   Height:        Intake/Output Summary (Last 24 hours) at 07/15/2019 1202 Last data filed at 07/15/2019 1200 Gross per 24 hour  Intake 360 ml  Output 200 ml  Net 160 ml   Filed Weights   07/13/19 0619 07/14/19 0639 07/15/19 0500  Weight: 117.2 kg 114.9 kg 114.5 kg     Data Reviewed:   CBC: Recent Labs  Lab 07/11/19 0500 07/12/19 0500 07/13/19 0248 07/14/19 0500 07/15/19 0456  WBC 6.0 6.3 7.8 8.8 10.6*  HGB 7.6* 7.9* 8.1* 8.3* 8.6*  HCT 24.5* 26.2* 26.7* 27.7* 28.5*  MCV 89.1 90.7 89.6 90.2 92.5  PLT 410* 433* 437* 431* 448*   Basic Metabolic Panel: Recent Labs  Lab 07/11/19 0500 07/12/19 0500 07/13/19 0248 07/14/19 0500 07/15/19 0456  NA 139 140 141 140 141  K 3.3* 3.6 3.8 4.1 3.9  CL 107 107 107 104 105  CO2 26 26 27 27 29   GLUCOSE 94 104* 103* 123* 122*  BUN 9 9 7  5* 8  CREATININE 0.55 0.51 0.53 0.49 0.46  CALCIUM 7.4* 7.7* 8.0* 7.8* 8.0*  MG 1.4* 1.7 1.5* 1.7 1.4*   GFR: Estimated Creatinine Clearance: 115.3 mL/min (by C-G  formula based on SCr of 0.46 mg/dL). Liver Function Tests: Recent Labs  Lab 07/10/19 0544 07/14/19 1331 07/15/19 0456  AST 11* 18 14*  ALT 8 8 10   ALKPHOS 93 86 82  BILITOT 0.4 0.4 0.4  PROT 5.0* 5.7* 5.5*  ALBUMIN 1.2* 1.6* 1.5*   No results for input(s): LIPASE, AMYLASE in the last 168 hours. No results for input(s): AMMONIA in the last 168 hours. Coagulation Profile: No results for input(s): INR, PROTIME in the last 168 hours. Cardiac Enzymes: No results for input(s): CKTOTAL, CKMB, CKMBINDEX, TROPONINI in the last 168 hours. BNP (last 3 results) No results for input(s): PROBNP in the last 8760 hours. HbA1C: No results for input(s): HGBA1C in the last 72 hours. CBG: Recent Labs  Lab 07/14/19 1142 07/14/19 1712 07/14/19 2133 07/15/19 0929 07/15/19 1137  GLUCAP 122* 116* 111* 106* 172*   Lipid Profile: No results for input(s): CHOL, HDL, LDLCALC, TRIG, CHOLHDL, LDLDIRECT in the last 72 hours. Thyroid Function Tests: No results for input(s): TSH, T4TOTAL, FREET4, T3FREE, THYROIDAB in the last 72 hours. Anemia Panel: No results for input(s): VITAMINB12, FOLATE,  FERRITIN, TIBC, IRON, RETICCTPCT in the last 72 hours. Sepsis Labs: Recent Labs  Lab 07/08/19 2220 07/09/19 0215  LATICACIDVEN 2.7* 2.4*    Recent Results (from the past 240 hour(s))  Culture, blood (x 2)     Status: None   Collection Time: 07/08/19 10:42 PM   Specimen: BLOOD  Result Value Ref Range Status   Specimen Description BLOOD RIGHT ARM  Final   Special Requests   Final    BOTTLES DRAWN AEROBIC AND ANAEROBIC Blood Culture results may not be optimal due to an excessive volume of blood received in culture bottles   Culture   Final    NO GROWTH 5 DAYS Performed at [facility], [place] [address], [place], [place] [address]    Report Status 07/13/2019 FINAL  Final  Culture, blood (x 2)     Status: None   Collection Time: 07/08/19 10:50 PM   Specimen: BLOOD  Result Value Ref Range Status    Specimen Description BLOOD RIGHT HAND  Final   Special Requests   Final    BOTTLES DRAWN AEROBIC ONLY Blood Culture results may not be optimal due to an excessive volume of blood received in culture bottles   Culture   Final    NO GROWTH 5 DAYS Performed at [facility], [place] [address] [place], [place] [address]    Report Status 07/13/2019 FINAL  Final         Radiology Studies: CT ABDOMEN PELVIS W CONTRAST  Result Date: 07/13/2019 CLINICAL DATA:  Diffuse abdominal pain EXAM: CT ABDOMEN AND PELVIS WITH CONTRAST TECHNIQUE: Multidetector CT imaging of the abdomen and pelvis was performed using the standard protocol following bolus administration of intravenous contrast. CONTRAST:  110mL OMNIPAQUE IOHEXOL 300 MG/ML  SOLN COMPARISON:  06/23/2019 FINDINGS: Lower chest: There are small bilateral pleural effusions with adjacent atelectasis.The heart size is normal. Hepatobiliary: The liver is normal. There is diffuse gallbladder wall thickening. A gallstone is noted. The gallbladder is mildly distended.There is no biliary ductal dilation. Pancreas: Normal contours without ductal dilatation. No peripancreatic fluid collection. Spleen: Unremarkable. Adrenals/Urinary Tract: --Adrenal glands: Unremarkable. --Right kidney/ureter: No hydronephrosis or radiopaque kidney stones. --Left kidney/ureter: No hydronephrosis or radiopaque kidney stones. --Urinary bladder: There is a Foley catheter in place. Stomach/Bowel: --Stomach/Duodenum: No hiatal hernia or other gastric abnormality. Normal duodenal course and caliber. --Small bowel: Unremarkable. --Colon: Unremarkable. --Appendix: Normal. Vascular/Lymphatic: Normal course and caliber of the major abdominal vessels. There is a possible filling defect within the right common femoral vein (axial series 3, image 96). --No retroperitoneal lymphadenopathy. --No mesenteric lymphadenopathy. --reactive lymphadenopathy is noted within the patient's pelvis and bilateral  inguinal regions. Reproductive: There is a large fibroid uterus. Other: No ascites or free air. The patient is status post prior debridement of the right inguinal region. A surgical drain is noted coursing from the patient's left inguinal region across the midline. Inferior to the surgical drain there is a 4 x 1.5 cm abscess in the subcutaneous fat overlying the low left inguinal region (axial series 3, image 98). Pockets of subcutaneous gas are noted in the patient's anterior abdominal wall and pannus which may be postsurgical or post infectious in etiology. There is skin thickening overlying the lateral right proximal thigh. Musculoskeletal. No acute displaced fractures. IMPRESSION: 1. Cholelithiasis with diffuse gallbladder wall thickening and a gallstone. Findings could represent acute cholecystitis in the appropriate clinical setting. If there is concern for acute cholecystitis, follow-up with ultrasound is recommended. 2. Possible filling  defect within the right common femoral vein. Recommend correlation with lower extremity venous Doppler ultrasound to help exclude an underlying DVT. 3. Small bilateral pleural effusions with adjacent atelectasis. 4. Probable 3.5 cm abscess in the subcutaneous fat overlying the left inguinal region, inferior to the left lateral pro chin surgical drain. 5. Extensive postsurgical changes overlying the right inguinal region related to interval debridement. 6. Fibroid uterus. These results will be called to the ordering clinician or representative by the Radiologist Assistant, and communication documented in the PACS or [employer]. Electronically Signed   By: Katherine Mantle M.D.   On: 07/13/2019 16:47   US Abdomen Limited RUQ  Result Date: 07/14/2019 CLINICAL DATA:  Abdominal pain EXAM: ULTRASOUND ABDOMEN LIMITED RIGHT UPPER QUADRANT COMPARISON:  07/13/2019 FINDINGS: Gallbladder: There is diffuse gallbladder wall thickening which measures 5.5 mm in maximum thickness.  There is a stone within the neck of gallbladder measuring 2 cm. Gallbladder sludge is present. Positive sonographic Murphy's sign. Common bile duct: Diameter: 6.5 mm Liver: No focal lesion identified. Within normal limits in parenchymal echogenicity. Portal vein is patent on color Doppler imaging with normal direction of blood flow towards the liver. Other: None. IMPRESSION: 1. Diffuse gallbladder wall thickening with gallstone and positive sonographic Murphy's sign. Imaging findings are compatible with acute cholecystitis. 2. Small right pleural effusion. Electronically Signed   By: Signa Kell M.D.   On: 07/14/2019 11:34        Scheduled Meds: . sodium chloride   Intravenous Once  . acetaminophen  1,000 mg Oral Q6H  . amoxicillin-clavulanate  1 tablet Oral Q12H  . vitamin C  500 mg Oral BID  . benztropine  1 mg Oral BID  . chlorhexidine  15 mL Mouth Rinse BID  . Chlorhexidine Gluconate Cloth  6 each Topical Daily  . Gerhardt's butt cream   Topical BID  . haloperidol  10 mg Oral q morning - 10a  . haloperidol  20 mg Oral QHS  . insulin aspart  0-5 Units Subcutaneous QHS  . insulin aspart  0-9 Units Subcutaneous TID WC  . insulin detemir  5 Units Subcutaneous Daily  . mouth rinse  15 mL Mouth Rinse q12n4p  . morphine      . multivitamin with minerals  1 tablet Oral Daily  . nutrition supplement (JUVEN)  1 packet Oral BID BM  . pantoprazole  40 mg Oral Daily  . propranolol ER  80 mg Oral Daily  . sodium chloride flush  10-40 mL Intracatheter Q12H  . sodium chloride flush  10-40 mL Intracatheter Q12H  . temazepam  30 mg Oral QHS  . valproic acid  250 mg Oral BID  . zinc sulfate  220 mg Oral Daily   Continuous Infusions: . sodium chloride 10 mL/hr at 07/10/19 0626  . lactated ringers Stopped (07/15/19 0624)     LOS: 22 days   Time spent= 35 mins    Siarra Gilkerson Joline Maxcy, MD [facility]  If 7PM-7AM, please contact night-coverage  07/15/2019, 12:02 PM

## 2019-07-15 NOTE — Progress Notes (Signed)
Patient ID: Kimberly Orozco, female   DOB: 01-26-1976, 44 y.o.   MRN: [id number]    7 Days Post-Op  Subjective: Patient feeling better today.  No abdominal pain at this time.  Just got back from HIDA scan  ROS: See above, otherwise other systems negative  Objective: Vital signs in last 24 hours: Temp:  [98.1 F (36.7 C)-98.6 F (37 C)] 98.6 F (37 C) (05/04 0500) Pulse Rate:  [84-94] 84 (05/04 0936) Resp:  [20-23] 20 (05/04 0936) BP: (116-136)/(70-80) 130/80 (05/04 0936) SpO2:  [97 %-100 %] 98 % (05/04 0936) Weight:  [114.5 kg] 114.5 kg (05/04 0500) Last BM Date: 07/14/19  Intake/Output from previous day: 05/03 0701 - 05/04 0700 In: 130 [P.O.:120; I.V.:10] Out: 200 [Urine:200] Intake/Output this shift: Total I/O In: 230 [P.O.:120; I.V.:10; IV Piggyback:100] Out: -   PE: Abd: soft, Nt currently, obese, +BS, wounds are all bandaged  Lab Results:  Recent Labs    07/14/19 0500 07/15/19 0456  WBC 8.8 10.6*  HGB 8.3* 8.6*  HCT 27.7* 28.5*  PLT 431* 448*   BMET Recent Labs    07/14/19 0500 07/15/19 0456  NA 140 141  K 4.1 3.9  CL 104 105  CO2 27 29  GLUCOSE 123* 122*  BUN 5* 8  CREATININE 0.49 0.46  CALCIUM 7.8* 8.0*   PT/INR No results for input(s): LABPROT, INR in the last 72 hours. CMP     Component Value Date/Time   NA 141 07/15/2019 0456   K 3.9 07/15/2019 0456   CL 105 07/15/2019 0456   CO2 29 07/15/2019 0456   GLUCOSE 122 (H) 07/15/2019 0456   BUN 8 07/15/2019 0456   CREATININE 0.46 07/15/2019 0456   CALCIUM 8.0 (L) 07/15/2019 0456   PROT 5.5 (L) 07/15/2019 0456   ALBUMIN 1.5 (L) 07/15/2019 0456   AST 14 (L) 07/15/2019 0456   ALT 10 07/15/2019 0456   ALKPHOS 82 07/15/2019 0456   BILITOT 0.4 07/15/2019 0456   GFRNONAA >60 07/15/2019 0456   GFRAA >60 07/15/2019 0456   Lipase  No results found for: LIPASE     Studies/Results: NM Hepatobiliary Liver Func  Result Date: 07/15/2019 CLINICAL DATA:  RIGHT upper quadrant pain.   Cholelithiasis. EXAM: NUCLEAR MEDICINE HEPATOBILIARY IMAGING TECHNIQUE: Sequential images of the abdomen were obtained out to 60 minutes following intravenous administration of radiopharmaceutical. RADIOPHARMACEUTICALS:  5.4 mCi Tc-19m  Choletec IV COMPARISON:  Ultrasound 07/14/2019 FINDINGS: Prompt clearance radiotracer from the blood pool and homogeneous uptake liver. Counts are present in the common bile duct and small bowel by 25 minutes. No filling of the gallbladder at 40 minutes. 3.0 mg of IV morphine were administered to contract the sphincter of Odi and promote gallbladder filling. Following morphine administration, the gallbladder filled. IMPRESSION: 1. Gallbladder filling indicating patent cystic duct. 2. Patent common bile duct. Electronically Signed   By: Suzy Bouchard M.D.   On: 07/15/2019 12:36   CT ABDOMEN PELVIS W CONTRAST  Result Date: 07/13/2019 CLINICAL DATA:  Diffuse abdominal pain EXAM: CT ABDOMEN AND PELVIS WITH CONTRAST TECHNIQUE: Multidetector CT imaging of the abdomen and pelvis was performed using the standard protocol following bolus administration of intravenous contrast. CONTRAST:  176mL OMNIPAQUE IOHEXOL 300 MG/ML  SOLN COMPARISON:  06/23/2019 FINDINGS: Lower chest: There are small bilateral pleural effusions with adjacent atelectasis.The heart size is normal. Hepatobiliary: The liver is normal. There is diffuse gallbladder wall thickening. A gallstone is noted. The gallbladder is mildly distended.There is no biliary ductal dilation. Pancreas: Normal  contours without ductal dilatation. No peripancreatic fluid collection. Spleen: Unremarkable. Adrenals/Urinary Tract: --Adrenal glands: Unremarkable. --Right kidney/ureter: No hydronephrosis or radiopaque kidney stones. --Left kidney/ureter: No hydronephrosis or radiopaque kidney stones. --Urinary bladder: There is a Foley catheter in place. Stomach/Bowel: --Stomach/Duodenum: No hiatal hernia or other gastric abnormality. Normal  duodenal course and caliber. --Small bowel: Unremarkable. --Colon: Unremarkable. --Appendix: Normal. Vascular/Lymphatic: Normal course and caliber of the major abdominal vessels. There is a possible filling defect within the right common femoral vein (axial series 3, image 96). --No retroperitoneal lymphadenopathy. --No mesenteric lymphadenopathy. --reactive lymphadenopathy is noted within the patient's pelvis and bilateral inguinal regions. Reproductive: There is a large fibroid uterus. Other: No ascites or free air. The patient is status post prior debridement of the right inguinal region. A surgical drain is noted coursing from the patient's left inguinal region across the midline. Inferior to the surgical drain there is a 4 x 1.5 cm abscess in the subcutaneous fat overlying the low left inguinal region (axial series 3, image 98). Pockets of subcutaneous gas are noted in the patient's anterior abdominal wall and pannus which may be postsurgical or post infectious in etiology. There is skin thickening overlying the lateral right proximal thigh. Musculoskeletal. No acute displaced fractures. IMPRESSION: 1. Cholelithiasis with diffuse gallbladder wall thickening and a gallstone. Findings could represent acute cholecystitis in the appropriate clinical setting. If there is concern for acute cholecystitis, follow-up with ultrasound is recommended. 2. Possible filling defect within the right common femoral vein. Recommend correlation with lower extremity venous Doppler ultrasound to help exclude an underlying DVT. 3. Small bilateral pleural effusions with adjacent atelectasis. 4. Probable 3.5 cm abscess in the subcutaneous fat overlying the left inguinal region, inferior to the left lateral pro chin surgical drain. 5. Extensive postsurgical changes overlying the right inguinal region related to interval debridement. 6. Fibroid uterus. These results will be called to the ordering clinician or representative by the  Radiologist Assistant, and communication documented in the PACS or [employer]. Electronically Signed   By: Katherine Mantle M.D.   On: 07/13/2019 16:47   US Abdomen Limited RUQ  Result Date: 07/14/2019 CLINICAL DATA:  Abdominal pain EXAM: ULTRASOUND ABDOMEN LIMITED RIGHT UPPER QUADRANT COMPARISON:  07/13/2019 FINDINGS: Gallbladder: There is diffuse gallbladder wall thickening which measures 5.5 mm in maximum thickness. There is a stone within the neck of gallbladder measuring 2 cm. Gallbladder sludge is present. Positive sonographic Murphy's sign. Common bile duct: Diameter: 6.5 mm Liver: No focal lesion identified. Within normal limits in parenchymal echogenicity. Portal vein is patent on color Doppler imaging with normal direction of blood flow towards the liver. Other: None. IMPRESSION: 1. Diffuse gallbladder wall thickening with gallstone and positive sonographic Murphy's sign. Imaging findings are compatible with acute cholecystitis. 2. Small right pleural effusion. Electronically Signed   By: Signa Kell M.D.   On: 07/14/2019 11:34    Anti-infectives: Anti-infectives (From admission, onward)   Start     Dose/Rate Route Frequency Ordered Stop   07/12/19 1000  amoxicillin-clavulanate (AUGMENTIN) 875-125 MG per tablet 1 tablet     1 tablet Oral Every 12 hours 07/12/19 0740 07/19/19 0959   07/09/19 0200  clindamycin (CLEOCIN) IVPB 900 mg  Status:  Discontinued     900 mg 100 mL/hr over 30 Minutes Intravenous Every 8 hours 07/09/19 0034 07/11/19 0812   07/08/19 2245  vancomycin (VANCOCIN) IVPB 1000 mg/200 mL premix  Status:  Discontinued     1,000 mg 200 mL/hr over 60 Minutes Intravenous Every  12 hours 07/08/19 2231 07/12/19 0740   07/08/19 2245  ceFEPIme (MAXIPIME) 2 g in sodium chloride 0.9 % 100 mL IVPB  Status:  Discontinued     2 g 200 mL/hr over 30 Minutes Intravenous Every 8 hours 07/08/19 2231 07/12/19 0740   07/08/19 2215  ceFEPIme (MAXIPIME) 2 g in sodium chloride 0.9 % 100  mL IVPB  Status:  Discontinued     2 g 200 mL/hr over 30 Minutes Intravenous  Once 07/08/19 2210 07/09/19 0000   07/08/19 2215  metroNIDAZOLE (FLAGYL) IVPB 500 mg  Status:  Discontinued     500 mg 100 mL/hr over 60 Minutes Intravenous Every 8 hours 07/08/19 2210 07/09/19 0846   07/08/19 2215  vancomycin (VANCOCIN) IVPB 1000 mg/200 mL premix  Status:  Discontinued     1,000 mg 200 mL/hr over 60 Minutes Intravenous  Once 07/08/19 2210 07/09/19 0001   07/08/19 1115  ceFAZolin (ANCEF) IVPB 2g/100 mL premix     2 g 200 mL/hr over 30 Minutes Intravenous On call to O.R. 07/08/19 1104 07/08/19 1330   06/26/19 1200  Vancomycin (VANCOCIN) 1,500 mg in sodium chloride 0.9 % 500 mL IVPB  Status:  Discontinued     1,500 mg 250 mL/hr over 120 Minutes Intravenous Every 24 hours 06/25/19 1153 06/26/19 0455   06/26/19 1200  vancomycin (VANCOREADY) IVPB 1500 mg/300 mL  Status:  Discontinued     1,500 mg 150 mL/hr over 120 Minutes Intravenous Every 24 hours 06/26/19 0455 06/26/19 0730   06/26/19 0830  vancomycin (VANCOREADY) IVPB 1500 mg/300 mL  Status:  Discontinued     1,000 mg 100 mL/hr over 120 Minutes Intravenous Every 12 hours 06/26/19 0730 06/26/19 0741   06/26/19 0830  vancomycin (VANCOCIN) IVPB 1000 mg/200 mL premix  Status:  Discontinued     1,000 mg 200 mL/hr over 60 Minutes Intravenous Every 12 hours 06/26/19 0742 07/04/19 1238   06/25/19 1145  vancomycin (VANCOCIN) 500 mg in sodium chloride 0.9 % 100 mL IVPB     500 mg 100 mL/hr over 60 Minutes Intravenous NOW 06/25/19 1055 06/25/19 1241   06/24/19 1600  vancomycin (VANCOREADY) IVPB 750 mg/150 mL  Status:  Discontinued     750 mg 150 mL/hr over 60 Minutes Intravenous Every 12 hours 06/23/19 1700 06/24/19 0714   06/24/19 1600  vancomycin (VANCOCIN) IVPB 1000 mg/200 mL premix  Status:  Discontinued     1,000 mg 200 mL/hr over 60 Minutes Intravenous Every 12 hours 06/24/19 0714 06/24/19 0750   06/24/19 1400  clindamycin (CLEOCIN) IVPB 900 mg   Status:  Discontinued     900 mg 100 mL/hr over 30 Minutes Intravenous Every 8 hours 06/24/19 0756 06/29/19 1137   06/24/19 1145  ceFEPIme (MAXIPIME) 2 g in sodium chloride 0.9 % 100 mL IVPB  Status:  Discontinued     2 g 200 mL/hr over 30 Minutes Intravenous To [facility]Stay [facility] 06/24/19 1131 06/24/19 1457   06/24/19 1030  ceFEPIme (MAXIPIME) 2 g in sodium chloride 0.9 % 100 mL IVPB  Status:  Discontinued     2 g 200 mL/hr over 30 Minutes Intravenous Every 8 hours 06/24/19 0714 07/04/19 1238   06/24/19 0800  vancomycin (VANCOCIN) IVPB 1000 mg/200 mL premix  Status:  Discontinued     1,000 mg 200 mL/hr over 60 Minutes Intravenous Every 12 hours 06/24/19 0750 06/25/19 1153   06/24/19 0300  ceFEPIme (MAXIPIME) 2 g in sodium chloride 0.9 % 100 mL IVPB  Status:  Discontinued  2 g 200 mL/hr over 30 Minutes Intravenous Every 12 hours 06/23/19 1700 06/24/19 0714   06/23/19 1700  clindamycin (CLEOCIN) IVPB 600 mg  Status:  Discontinued     600 mg 100 mL/hr over 30 Minutes Intravenous Every 8 hours 06/23/19 1649 06/24/19 0756   06/23/19 1500  vancomycin (VANCOREADY) IVPB 2000 mg/400 mL     2,000 mg 200 mL/hr over 120 Minutes Intravenous  Once 06/23/19 1454 06/23/19 1747   06/23/19 1445  ceFEPIme (MAXIPIME) 2 g in sodium chloride 0.9 % 100 mL IVPB     2 g 200 mL/hr over 30 Minutes Intravenous  Once 06/23/19 1434 06/23/19 1557   06/23/19 1445  metroNIDAZOLE (FLAGYL) IVPB 500 mg     500 mg 100 mL/hr over 60 Minutes Intravenous  Once 06/23/19 1434 06/23/19 1608   06/23/19 1445  vancomycin (VANCOCIN) IVPB 1000 mg/200 mL premix  Status:  Discontinued     1,000 mg 200 mL/hr over 60 Minutes Intravenous  Once 06/23/19 1434 06/23/19 1440   06/23/19 1445  vancomycin (VANCOCIN) 2,000 mg in sodium chloride 0.9 % 500 mL IVPB  Status:  Discontinued     2,000 mg 250 mL/hr over 120 Minutes Intravenous  Once 06/23/19 1440 06/23/19 1454       Assessment/Plan T2DMwith DKA  Acute renal failure -  Crnormal, 0.50 Hypokalemia  Schizophrenia Bipolar disorder ABL anemia -hgb 8.3s/p 1U this AM. Also required transfusions 4/18 &4/25 SIRS +Lactic Acidosis RLE pain - DVT US  Necrotizing fasciitis of abdominal wall and groin S/P I&D 45 x 30 x 4 cm 06/24/19 and 4/15 by Dr. Derrell Lolling - S/pdebridement of abdominal and right thigh wound; closure of right abdominal wound and the left abdominal wound; and application of meshed primatrix AGto the right thigh covered by Mepitel4/27with Dr. Mamie Levers. Arita Miss of plastics. They would like the dressing to stay in place until Friday - Will defer wound care to them at this time - New findings on CT scan discussed with plastics.  No indications for drainage or anything else at this point.  abx therapy for now.  Possible cholecystitis -HIDA scan negative for cholecystitis. -no plans for surgical intervention -may resume diet  DDU:KGUR mod VTE: SCDs, chemical prophylaxis ok from our standpoint [id number] 4/12; Clinda 4/13 - 4/18 . Cefepime/Vanc 4/12- 4/23.Cefepime/Vanc/Clinda 4/27 >> completed.  Augmentin 5/1 --> Foley - in place; will defer timing of removal to plastics  We will sign off at this time.  LOS: 22 days    Letha Cape , [facility] 07/15/2019, 2:19 PM Please see Amion for pager number during day hours 7:00am-4:30pm or 7:00am -11:30am on weekends

## 2019-07-15 NOTE — Progress Notes (Addendum)
         RE:  Kimberly Orozco         Date of Birth:  1975/10/17       Date: 07/15/2019               To Whom It May Concern:      Please be advised that the above-named patient will require a short-term nursing home stay - anticipated 30 days or less for rehabilitation and strengthening.  The plan is for return home.

## 2019-07-16 DIAGNOSIS — D72829 Elevated white blood cell count, unspecified: Secondary | ICD-10-CM

## 2019-07-16 LAB — COMPREHENSIVE METABOLIC PANEL
ALT: 7 U/L (ref 0–44)
AST: 11 U/L — ABNORMAL LOW (ref 15–41)
Albumin: 1.4 g/dL — ABNORMAL LOW (ref 3.5–5.0)
Alkaline Phosphatase: 84 U/L (ref 38–126)
Anion gap: 7 (ref 5–15)
BUN: 6 mg/dL (ref 6–20)
CO2: 28 mmol/L (ref 22–32)
Calcium: 8 mg/dL — ABNORMAL LOW (ref 8.9–10.3)
Chloride: 102 mmol/L (ref 98–111)
Creatinine, Ser: 0.47 mg/dL (ref 0.44–1.00)
GFR calc Af Amer: >60 mL/min (ref >60)
GFR calc non Af Amer: >60 mL/min (ref >60)
Glucose, Bld: 94 mg/dL (ref 70–99)
Potassium: 3.9 mmol/L (ref 3.5–5.1)
Sodium: 137 mmol/L (ref 135–145)
Total Bilirubin: 0.2 mg/dL — ABNORMAL LOW (ref 0.3–1.2)
Total Protein: 5.3 g/dL — ABNORMAL LOW (ref 6.5–8.1)

## 2019-07-16 LAB — GLUCOSE, CAPILLARY
Glucose-Capillary: 89 mg/dL (ref 70–99)
Glucose-Capillary: 89 mg/dL (ref 70–99)
Glucose-Capillary: 87 mg/dL (ref 70–99)
Glucose-Capillary: 113 mg/dL — ABNORMAL HIGH (ref 70–99)

## 2019-07-16 LAB — CBC
HCT: 27.3 % — ABNORMAL LOW (ref 36.0–46.0)
Hemoglobin: 8.2 g/dL — ABNORMAL LOW (ref 12.0–15.0)
MCH: 28.1 pg (ref 26.0–34.0)
MCHC: 30 g/dL (ref 30.0–36.0)
MCV: 93.5 fL (ref 80.0–100.0)
Platelets: 443 10*3/uL — ABNORMAL HIGH (ref 150–400)
RBC: 2.92 MIL/uL — ABNORMAL LOW (ref 3.87–5.11)
RDW: 17.3 % — ABNORMAL HIGH (ref 11.5–15.5)
WBC: 10.5 10*3/uL (ref 4.0–10.5)
nRBC: 0 % (ref 0.0–0.2)

## 2019-07-16 LAB — MAGNESIUM: Magnesium: 1.7 mg/dL (ref 1.7–2.4)

## 2019-07-16 NOTE — Progress Notes (Signed)
PROGRESS NOTE    Kimberly Orozco  BOF:[id number] DOB: 1976-03-02 DOA: 06/23/2019 PCP: Patient, No Pcp Per   Brief Narrative:  44 year old with history of DM2, schizophrenia, bipolar disorder admitted with DKA in the setting of necrotizing fasciitis of the right groin, left inguinal crease and right medial thigh.  She underwent debridement on 4/13 and then again on 4/15.  Initially intubated on 4/12, thereafter extubated on 4/16 and transferred out of the ICU on 4/17.  Thoracic surgery team was consulted, another debridement was performed on 4/27 with estimated blood loss 450 cc.  On 4/28 patient again became febrile, hypotensive oligoanuric requiring transfer to the ICU.  Has been receiving multiple course of antibiotics.  ID was consulted, antibiotics were consolidated down to Augmentin. Due to Abd pain, repeat Ab/Pelvis was performed which showed small abscess in the left ground and concerns of acute cholecystitis. Gen surgery reconsulted, RUQ [place] confirmed Acute Cholecystitis, HIDA scan done - results pending.    Assessment & Plan:   Active Problems:   DKA (diabetic ketoacidoses) (HCC)   Necrotizing cellulitis   Septic shock (HCC)   AKI (acute kidney injury) (HCC)   Acute respiratory failure (HCC)   Leukocytosis   Septic shock secondary to necrotizing fasciitis of abdominal wound, perineum and the right thigh Persistent abdominal pain - Right inguinal wound dehisced overnight -surgery to evaluate  - shock physiology improved - Per ID on Augmentin, last day 5/7 - Continue to follow culture data. - Seen by plastic surgery-plastic surgery following. - Vitamin C, zinc, supplemental boost - Wound care per plastic surgery - Patient has Foley catheter in place to keep her lower abdominal and thigh wounds clean.  3.5 cm subcutaneous abscess in the left inguinal region -General surgery- conservative management for now.   Cholelithiasis with gallbladder wall thickening Questionable acute  cholecystitis -Suspicion for acute cholecystitis.  Confirmed on RUQ [place]. HIDA scan done- results remarkable for patent ducts, will continue to follow clinically, no indication for surgery at this time  Urine leakage; improved.  -CT A/P = no fistula seen.   Oliguria Anasarca/hypoalbuminemia -Encourage adequate diet, follow I's and O's:  Intake/Output Summary (Last 24 hours) at 07/16/2019 1106 Last data filed at 07/15/2019 1800 Gross per 24 hour  Intake 340 ml  Output 400 ml  Net -60 ml   Right common femoral vein filling defect -Suspect secondary to extensive surgery.  Will be difficult to obtain ultrasound of this region given the extent of superficial problems given above.  Acute blood loss anemia, stable -Requiring frequent transfusions 3 U so far (4/18; 4/25; 4/28).  Maintain hemoglobin greater than 7.  Ambulatory dysfunction  -Likely secondary to above, acute infection, weight gain, edema and prolonged hospitalization -PT evaluating initially recommending CIR placement however patient does not meet CIR criteria, pending SNF placement at this time  Sinus tachycardia; resolved  -resolved with resumption of Propranolol  Diabetes mellitus type 2, fairly well controlled now -Sliding scale -Levemir 5 units daily  History of schizophrenia with bipolar disorder -Continue Haldol, valproate and Cogentin  Obesity with BMI greater than 35 -Weight loss diet and exercise  DVT prophylaxis: SCDs Code Status: Full code Family Communication:    Status is: Inpatient  Remains inpatient appropriate because:Hemodynamically unstable.  Still getting wound care dressing, oral antibiotics and management for generalized abdominal pain Dispo: The patient is from: Home              Anticipated d/c is to: SNF  Anticipated d/c date is: > 3 days              Patient currently is not medically stable to d/c.  Still need to address generalized abdominal pain concerns for cholecystitis,  abscess on the left side  Subjective: No acute issues or events overnight, otherwise declines fevers, chills, nausea, vomiting, diarrhea, constipation, headache.  Examination: Constitutional: Not in acute distress Respiratory: Clear to auscultation bilaterally Cardiovascular: Normal sinus rhythm, no rubs Abdomen: Nontender nondistended good bowel sounds Musculoskeletal: No edema noted Skin: b/l groin dressing notes.  Neurologic: CN 2-12 grossly intact.  And nonfocal Psychiatric: Normal judgment and insight. Alert and oriented x 3. Normal mood.    PICC line 07/09/2019--> ongoing Foley catheter 06/24/2019--> ongoing  Objective: Vitals:   07/15/19 1453 07/15/19 1632 07/15/19 2027 07/16/19 0500  BP: 116/68 108/81 113/67 120/84  Pulse: 79 82 84 89  Resp: 19 18 (!) 21 (!) 21  Temp: 98 F (36.7 C)  98 F (36.7 C) 98.4 F (36.9 C)  TempSrc: Oral  Oral Oral  SpO2: 99% 97% 97% 97%  Weight:    112.4 kg  Height:        Intake/Output Summary (Last 24 hours) at 07/16/2019 0727 Last data filed at 07/15/2019 1800 Gross per 24 hour  Intake 470 ml  Output 400 ml  Net 70 ml   Filed Weights   07/14/19 0639 07/15/19 0500 07/16/19 0500  Weight: 114.9 kg 114.5 kg 112.4 kg     Data Reviewed:   CBC: Recent Labs  Lab 07/12/19 0500 07/13/19 0248 07/14/19 0500 07/15/19 0456 07/16/19 0437  WBC 6.3 7.8 8.8 10.6* 10.5  HGB 7.9* 8.1* 8.3* 8.6* 8.2*  HCT 26.2* 26.7* 27.7* 28.5* 27.3*  MCV 90.7 89.6 90.2 92.5 93.5  PLT 433* 437* 431* 448* 505*   Basic Metabolic Panel: Recent Labs  Lab 07/12/19 0500 07/13/19 0248 07/14/19 0500 07/15/19 0456 07/16/19 0437  NA 140 141 140 141 137  K 3.6 3.8 4.1 3.9 3.9  CL 107 107 104 105 102  CO2 26 27 27 29 28   GLUCOSE 104* 103* 123* 122* 94  BUN 9 7 5* 8 6  CREATININE 0.51 0.53 0.49 0.46 0.47  CALCIUM 7.7* 8.0* 7.8* 8.0* 8.0*  MG 1.7 1.5* 1.7 1.4* 1.7   GFR: Estimated Creatinine Clearance: 114 mL/min (by C-G formula based on SCr of 0.47  mg/dL). Liver Function Tests: Recent Labs  Lab 07/10/19 0544 07/14/19 1331 07/15/19 0456 07/16/19 0437  AST 11* 18 14* 11*  ALT 8 8 10 7   ALKPHOS 93 86 82 84  BILITOT 0.4 0.4 0.4 0.2*  PROT 5.0* 5.7* 5.5* 5.3*  ALBUMIN 1.2* 1.6* 1.5* 1.4*   No results for input(s): LIPASE, AMYLASE in the last 168 hours. No results for input(s): AMMONIA in the last 168 hours. Coagulation Profile: No results for input(s): INR, PROTIME in the last 168 hours. Cardiac Enzymes: No results for input(s): CKTOTAL, CKMB, CKMBINDEX, TROPONINI in the last 168 hours. BNP (last 3 results) No results for input(s): PROBNP in the last 8760 hours. HbA1C: No results for input(s): HGBA1C in the last 72 hours. CBG: Recent Labs  Lab 07/15/19 0929 07/15/19 1137 07/15/19 1627 07/15/19 1645 07/15/19 2051  GLUCAP 106* 172* 107* 92 91   Lipid Profile: No results for input(s): CHOL, HDL, LDLCALC, TRIG, CHOLHDL, LDLDIRECT in the last 72 hours. Thyroid Function Tests: No results for input(s): TSH, T4TOTAL, FREET4, T3FREE, THYROIDAB in the last 72 hours. Anemia Panel: No results  for input(s): VITAMINB12, FOLATE, FERRITIN, TIBC, IRON, RETICCTPCT in the last 72 hours. Sepsis Labs: No results for input(s): PROCALCITON, LATICACIDVEN in the last 168 hours.  Recent Results (from the past 240 hour(s))  Culture, blood (x 2)     Status: None   Collection Time: 07/08/19 10:42 PM   Specimen: BLOOD  Result Value Ref Range Status   Specimen Description BLOOD RIGHT ARM  Final   Special Requests   Final    BOTTLES DRAWN AEROBIC AND ANAEROBIC Blood Culture results may not be optimal due to an excessive volume of blood received in culture bottles   Culture   Final    NO GROWTH 5 DAYS Performed at [facility], [address] [place], [place] [address]    Report Status 07/13/2019 FINAL  Final  Culture, blood (x 2)     Status: None   Collection Time: 07/08/19 10:50 PM   Specimen: BLOOD  Result Value Ref Range Status    Specimen Description BLOOD RIGHT HAND  Final   Special Requests   Final    BOTTLES DRAWN AEROBIC ONLY Blood Culture results may not be optimal due to an excessive volume of blood received in culture bottles   Culture   Final    NO GROWTH 5 DAYS Performed at  [facility], [address] [place], [place] [address]    Report Status 07/13/2019 FINAL  Final     Radiology Studies: NM Hepatobiliary Liver Func  Result Date: 07/15/2019 CLINICAL DATA:  RIGHT upper quadrant pain.  Cholelithiasis. EXAM: NUCLEAR MEDICINE HEPATOBILIARY IMAGING TECHNIQUE: Sequential images of the abdomen were obtained out to 60 minutes following intravenous administration of radiopharmaceutical. RADIOPHARMACEUTICALS:  5.4 mCi Tc-42m  Choletec IV COMPARISON:  Ultrasound 07/14/2019 FINDINGS: Prompt clearance radiotracer from the blood pool and homogeneous uptake liver. Counts are present in the common bile duct and small bowel by 25 minutes. No filling of the gallbladder at 40 minutes. 3.0 mg of IV morphine were administered to contract the sphincter of Odi and promote gallbladder filling. Following morphine administration, the gallbladder filled. IMPRESSION: 1. Gallbladder filling indicating patent cystic duct. 2. Patent common bile duct. Electronically Signed   By: Genevive Bi M.D.   On: 07/15/2019 12:36   US Abdomen Limited RUQ  Result Date: 07/14/2019 CLINICAL DATA:  Abdominal pain EXAM: ULTRASOUND ABDOMEN LIMITED RIGHT UPPER QUADRANT COMPARISON:  07/13/2019 FINDINGS: Gallbladder: There is diffuse gallbladder wall thickening which measures 5.5 mm in maximum thickness. There is a stone within the neck of gallbladder measuring 2 cm. Gallbladder sludge is present. Positive sonographic Murphy's sign. Common bile duct: Diameter: 6.5 mm Liver: No focal lesion identified. Within normal limits in parenchymal echogenicity. Portal vein is patent on color Doppler imaging with normal direction of blood flow towards the liver.  Other: None. IMPRESSION: 1. Diffuse gallbladder wall thickening with gallstone and positive sonographic Murphy's sign. Imaging findings are compatible with acute cholecystitis. 2. Small right pleural effusion. Electronically Signed   By: Signa Kell M.D.   On: 07/14/2019 11:34     Scheduled Meds: . sodium chloride   Intravenous Once  . acetaminophen  1,000 mg Oral Q6H  . amoxicillin-clavulanate  1 tablet Oral Q12H  . vitamin C  500 mg Oral BID  . benztropine  1 mg Oral BID  . Chlorhexidine Gluconate Cloth  6 each Topical Daily  . Gerhardt's butt cream   Topical BID  . haloperidol  10 mg Oral q morning - 10a  . haloperidol  20 mg Oral QHS  . insulin aspart  0-5 Units Subcutaneous QHS  . insulin aspart  0-9 Units Subcutaneous TID WC  . insulin detemir  5 Units Subcutaneous Daily  . multivitamin with minerals  1 tablet Oral Daily  . nutrition supplement (JUVEN)  1 packet Oral BID BM  . pantoprazole  40 mg Oral Daily  . propranolol ER  80 mg Oral Daily  . sodium chloride flush  10-40 mL Intracatheter Q12H  . sodium chloride flush  10-40 mL Intracatheter Q12H  . temazepam  30 mg Oral QHS  . valproic acid  250 mg Oral BID  . zinc sulfate  220 mg Oral Daily   Continuous Infusions: . sodium chloride 10 mL/hr at 07/10/19 0626  . lactated ringers 75 mL/hr at 07/15/19 1933     LOS: 23 days   Time spent > 35 mins  Azucena Fallen, DO [facility]  If 7PM-7AM, please contact night-coverage  07/16/2019, 7:27 AM

## 2019-07-16 NOTE — Progress Notes (Signed)
Patient's right inguinal wound appears to be dehisced, assessed during a.m dressing change on 07/15/19. Will notify surgeon and continue to monitor.   Bari Edward, RN

## 2019-07-16 NOTE — TOC Progression Note (Signed)
Transition of Care [facility]) - Progression Note    Patient Details  Name: Kimberly Orozco MRN: [id number] Date of Birth: 1976-02-15  [facility]) CM/SW Contact  Terrial Rhodes, LCSWA Phone Number: 07/16/2019, 11:56 AM  Clinical Narrative:     No bed offers at this time. PASARR number still under review. CSW still trying to find possible SNF placement for patient.  CSW will continue to follow.    Expected Discharge Plan: Skilled Nursing Facility Barriers to Discharge: Continued Medical Work up  Expected Discharge Plan and Services Expected Discharge Plan: Skilled Nursing Facility       Living arrangements for the past 2 months: Single Family Home                                       Social Determinants of Health (SDOH) Interventions    Readmission Risk Interventions No flowsheet data found.

## 2019-07-16 NOTE — Progress Notes (Signed)
Notified Joni Fears PA of report from night nurse that right ing wound was dehiscing. Young stated to cont current dressing change at this time. Emelda Brothers RN

## 2019-07-17 ENCOUNTER — Encounter (HOSPITAL_COMMUNITY): Payer: Self-pay | Admitting: Internal Medicine

## 2019-07-17 LAB — GLUCOSE, CAPILLARY
Glucose-Capillary: 100 mg/dL — ABNORMAL HIGH (ref 70–99)
Glucose-Capillary: 154 mg/dL — ABNORMAL HIGH (ref 70–99)
Glucose-Capillary: 104 mg/dL — ABNORMAL HIGH (ref 70–99)
Glucose-Capillary: 107 mg/dL — ABNORMAL HIGH (ref 70–99)

## 2019-07-17 NOTE — Progress Notes (Signed)
RN questioning if foley is leaking due to right thigh dressing abdominal pads and roll gauze had moderate amount of yellow drainage both times this shift when RN changed dressing and no yellow drainage noted draining from wound when dressing removed.  This morning patient stated she felt like she had to urinate.  RN placed patient on bed pan and patient voided clear, yellow urine into bed pan.  Will pass onto day shift RN to update day MD(s).

## 2019-07-17 NOTE — TOC Progression Note (Signed)
Transition of Care [facility]) - Progression Note    Patient Details  Name: Kimberly Orozco MRN: [id number] Date of Birth: 1975/11/12  Transition of Care [facility]) CM/SW Contact  Terrial Rhodes, LCSWA Phone Number: 07/17/2019, 12:55 PM  Clinical Narrative:     CSW spoke with patients mother Steward Drone. Patients mother was agreeable to SNF placement at [facility] in [facility]. CSW called [facility] to check on PASARR. PASARR has gone to level 2. They will call with furthur instructions today.  Patient has bed at [facility] in [facility]. PASARR number pending.   Expected Discharge Plan: Skilled Nursing Facility Barriers to Discharge: Continued Medical Work up  Expected Discharge Plan and Services Expected Discharge Plan: Skilled Nursing Facility       Living arrangements for the past 2 months: Single Family Home                                       Social Determinants of Health (SDOH) Interventions    Readmission Risk Interventions No flowsheet data found.

## 2019-07-17 NOTE — Progress Notes (Signed)
PROGRESS NOTE    Kimberly Orozco  ZDG:[id number] DOB: 1975-04-14 DOA: 06/23/2019 PCP: Patient, No Pcp Per   Brief Narrative:  44 year old with history of DM2, schizophrenia, bipolar disorder admitted with DKA in the setting of necrotizing fasciitis of the right groin, left inguinal crease and right medial thigh.  She underwent debridement on 4/13 and then again on 4/15.  Initially intubated on 4/12, thereafter extubated on 4/16 and transferred out of the ICU on 4/17.  Thoracic surgery team was consulted, another debridement was performed on 4/27 with estimated blood loss 450 cc.  On 4/28 patient again became febrile, hypotensive oligoanuric requiring transfer to the ICU.  Has been receiving multiple course of antibiotics.  ID was consulted, antibiotics were consolidated down to Augmentin. Due to Abd pain, repeat Ab/Pelvis was performed which showed small abscess in the left ground and concerns of acute cholecystitis. Gen surgery reconsulted, RUQ [place] confirmed Acute Cholecystitis, HIDA scan done - results pending.    Assessment & Plan:   Active Problems:   DKA (diabetic ketoacidoses) (HCC)   Necrotizing cellulitis   Septic shock ([place])   AKI (acute kidney injury) ([place])   Acute respiratory failure (HCC)   Leukocytosis   Septic shock secondary to necrotizing fasciitis of abdominal wound, perineum and the right thigh Persistent abdominal pain - Right inguinal wound dehisced overnight - Plastics aware:           Tentative plan for split thickness skin graft to right thigh wound on 5/11. - Shock physiology improved - Per ID on Augmentin, last day 5/7 - Continue to follow culture data. - Vitamin C, zinc, supplemental boost - Wound care per plastic surgery - Foley removed - leaking onto bandages overnight - defeating the purpose of initial reason for placement.  3.5 cm subcutaneous abscess in the left inguinal region -General surgery- conservative management for now.   Cholelithiasis with  gallbladder wall thickening Questionable acute cholecystitis -Suspicion for acute cholecystitis.  Confirmed on RUQ [place]. HIDA scan done- results remarkable for patent ducts, will continue to follow clinically, no indication for surgery at this time  Urine leakage; improved.  -CT A/P = no fistula seen.   Oliguria Anasarca/hypoalbuminemia -Catheter pulled 07/17/19 - issues with obstruction/inability to flush -Encourage adequate diet, follow I's and O's:  Intake/Output Summary (Last 24 hours) at 07/17/2019 0728 Last data filed at 07/17/2019 0540 Gross per 24 hour  Intake 130 ml  Output 300 ml  Net -170 ml   Right common femoral vein filling defect -Suspect secondary to extensive surgery.  Will be difficult to obtain ultrasound of this region given the extent of superficial problems given above.  Acute blood loss anemia, stable -Requiring frequent transfusions 3 U so far (4/18; 4/25; 4/28).  Maintain hemoglobin greater than 7.  Ambulatory dysfunction  -Likely secondary to above, acute infection, weight gain, edema and prolonged hospitalization -PT evaluating initially recommending CIR placement however patient does not meet CIR criteria, pending SNF placement at this time  Sinus tachycardia; resolved  -resolved with resumption of Propranolol  Diabetes mellitus type 2, fairly well controlled now -Sliding scale -Levemir 5 units daily  History of schizophrenia with bipolar disorder -Continue Haldol, valproate and Cogentin  Obesity with BMI greater than 35 -Weight loss diet and exercise  DVT prophylaxis: SCDs Code Status: Full code Family Communication:    Status is: Inpatient  Remains inpatient appropriate because:Hemodynamically unstable.  Still getting wound care dressing, oral antibiotics and management for generalized abdominal pain Dispo: The patient is from: Home  Anticipated d/c is to: SNF              Anticipated d/c date is: > 3 days              Patient  currently is not medically stable to d/c.  Plastic surgery pending repeat surgery 07/22/19  Subjective: No acute issues or events overnight, otherwise declines fevers, chills, nausea, vomiting, diarrhea, constipation, headache.  Examination: Constitutional: Not in acute distress Respiratory: Clear to auscultation bilaterally Cardiovascular: Normal sinus rhythm, no rubs Abdomen: Nontender nondistended good bowel sounds Musculoskeletal: No edema noted Skin: b/l groin dressing notes.  Neurologic: CN 2-12 grossly intact.  And nonfocal Psychiatric: Normal judgment and insight. Alert and oriented x 3. Normal mood.    PICC line 07/09/2019--> ongoing Foley catheter 06/24/2019--> ongoing  Objective: Vitals:   07/16/19 1133 07/16/19 1651 07/16/19 1953 07/17/19 0422  BP: 131/79 134/85 113/77 111/71  Pulse: 80  83 84  Resp: 20  (!) 22 (!) 22  Temp: 98.2 F (36.8 C) 98.1 F (36.7 C) 98.6 F (37 C) 98.8 F (37.1 C)  TempSrc: Oral Oral Oral Oral  SpO2: 98%  97% 96%  Weight:    110.8 kg  Height:        Intake/Output Summary (Last 24 hours) at 07/17/2019 0728 Last data filed at 07/17/2019 0540 Gross per 24 hour  Intake 130 ml  Output 300 ml  Net -170 ml   Filed Weights   07/15/19 0500 07/16/19 0500 07/17/19 0422  Weight: 114.5 kg 112.4 kg 110.8 kg     Data Reviewed:   CBC: Recent Labs  Lab 07/12/19 0500 07/13/19 0248 07/14/19 0500 07/15/19 0456 07/16/19 0437  WBC 6.3 7.8 8.8 10.6* 10.5  HGB 7.9* 8.1* 8.3* 8.6* 8.2*  HCT 26.2* 26.7* 27.7* 28.5* 27.3*  MCV 90.7 89.6 90.2 92.5 93.5  PLT 433* 437* 431* 448* 443*   Basic Metabolic Panel: Recent Labs  Lab 07/12/19 0500 07/13/19 0248 07/14/19 0500 07/15/19 0456 07/16/19 0437  NA 140 141 140 141 137  K 3.6 3.8 4.1 3.9 3.9  CL 107 107 104 105 102  CO2 26 27 27 29 28   GLUCOSE 104* 103* 123* 122* 94  BUN 9 7 5* 8 6  CREATININE 0.51 0.53 0.49 0.46 0.47  CALCIUM 7.7* 8.0* 7.8* 8.0* 8.0*  MG 1.7 1.5* 1.7 1.4* 1.7    GFR: Estimated Creatinine Clearance: 113.2 mL/min (by C-G formula based on SCr of 0.47 mg/dL). Liver Function Tests: Recent Labs  Lab 07/14/19 1331 07/15/19 0456 07/16/19 0437  AST 18 14* 11*  ALT 8 10 7   ALKPHOS 86 82 84  BILITOT 0.4 0.4 0.2*  PROT 5.7* 5.5* 5.3*  ALBUMIN 1.6* 1.5* 1.4*   No results for input(s): LIPASE, AMYLASE in the last 168 hours. No results for input(s): AMMONIA in the last 168 hours. Coagulation Profile: No results for input(s): INR, PROTIME in the last 168 hours. Cardiac Enzymes: No results for input(s): CKTOTAL, CKMB, CKMBINDEX, TROPONINI in the last 168 hours. BNP (last 3 results) No results for input(s): PROBNP in the last 8760 hours. HbA1C: No results for input(s): HGBA1C in the last 72 hours. CBG: Recent Labs  Lab 07/15/19 2051 07/16/19 0744 07/16/19 1129 07/16/19 1609 07/16/19 2055  GLUCAP 91 89 89 87 113*   Lipid Profile: No results for input(s): CHOL, HDL, LDLCALC, TRIG, CHOLHDL, LDLDIRECT in the last 72 hours. Thyroid Function Tests: No results for input(s): TSH, T4TOTAL, FREET4, T3FREE, THYROIDAB in the last 72 hours.  Anemia Panel: No results for input(s): VITAMINB12, FOLATE, FERRITIN, TIBC, IRON, RETICCTPCT in the last 72 hours. Sepsis Labs: No results for input(s): PROCALCITON, LATICACIDVEN in the last 168 hours.  Recent Results (from the past 240 hour(s))  Culture, blood (x 2)     Status: None   Collection Time: 07/08/19 10:42 PM   Specimen: BLOOD  Result Value Ref Range Status   Specimen Description BLOOD RIGHT ARM  Final   Special Requests   Final    BOTTLES DRAWN AEROBIC AND ANAEROBIC Blood Culture results may not be optimal due to an excessive volume of blood received in culture bottles   Culture   Final    NO GROWTH 5 DAYS Performed at [facility], [address] [place], [place] [address]    Report Status 07/13/2019 FINAL  Final  Culture, blood (x 2)     Status: None   Collection Time: 07/08/19 10:50 PM    Specimen: BLOOD  Result Value Ref Range Status   Specimen Description BLOOD RIGHT HAND  Final   Special Requests   Final    BOTTLES DRAWN AEROBIC ONLY Blood Culture results may not be optimal due to an excessive volume of blood received in culture bottles   Culture   Final    NO GROWTH 5 DAYS Performed at [facility], [address], [place], [place] [address]    Report Status 07/13/2019 FINAL  Final     Radiology Studies: NM Hepatobiliary Liver Func  Result Date: 07/15/2019 CLINICAL DATA:  RIGHT upper quadrant pain.  Cholelithiasis. EXAM: NUCLEAR MEDICINE HEPATOBILIARY IMAGING TECHNIQUE: Sequential images of the abdomen were obtained out to 60 minutes following intravenous administration of radiopharmaceutical. RADIOPHARMACEUTICALS:  5.4 mCi Tc-50m  Choletec IV COMPARISON:  Ultrasound 07/14/2019 FINDINGS: Prompt clearance radiotracer from the blood pool and homogeneous uptake liver. Counts are present in the common bile duct and small bowel by 25 minutes. No filling of the gallbladder at 40 minutes. 3.0 mg of IV morphine were administered to contract the sphincter of Odi and promote gallbladder filling. Following morphine administration, the gallbladder filled. IMPRESSION: 1. Gallbladder filling indicating patent cystic duct. 2. Patent common bile duct. Electronically Signed   By: Genevive Bi M.D.   On: 07/15/2019 12:36     Scheduled Meds: . sodium chloride   Intravenous Once  . acetaminophen  1,000 mg Oral Q6H  . amoxicillin-clavulanate  1 tablet Oral Q12H  . vitamin C  500 mg Oral BID  . benztropine  1 mg Oral BID  . Chlorhexidine Gluconate Cloth  6 each Topical Daily  . Gerhardt's butt cream   Topical BID  . haloperidol  10 mg Oral q morning - 10a  . haloperidol  20 mg Oral QHS  . insulin aspart  0-5 Units Subcutaneous QHS  . insulin aspart  0-9 Units Subcutaneous TID WC  . insulin detemir  5 Units Subcutaneous Daily  . multivitamin with minerals  1 tablet Oral Daily   . nutrition supplement (JUVEN)  1 packet Oral BID BM  . pantoprazole  40 mg Oral Daily  . propranolol ER  80 mg Oral Daily  . sodium chloride flush  10-40 mL Intracatheter Q12H  . sodium chloride flush  10-40 mL Intracatheter Q12H  . temazepam  30 mg Oral QHS  . valproic acid  250 mg Oral BID  . zinc sulfate  220 mg Oral Daily   Continuous Infusions: . sodium chloride 10 mL/hr at 07/10/19 0626  .  lactated ringers 75 mL/hr at 07/17/19 0206     LOS: 24 days   Time spent > 35 mins  Azucena Fallen, DO [facility]  If 7PM-7AM, please contact night-coverage  07/17/2019, 7:28 AM

## 2019-07-17 NOTE — Progress Notes (Signed)
Previously spoke to pharmacy at approximately 2300 hours regarding this patient's medications: cogentin, haldol, and depakene as they were not available in either pyxis.  Pharmacy indicated they would send or bring them up.  At this time they have not arrived.

## 2019-07-17 NOTE — Progress Notes (Signed)
Lunch tray ordered 

## 2019-07-17 NOTE — Progress Notes (Signed)
Nutrition Follow-up  DOCUMENTATION CODES:   Obesity unspecified  INTERVENTION:   Continue Juven BID, each packet provides 80-95 calories, 8 grams of carbohydrate, 2.5 grams of protein (collagen), 7 grams of L-arginine and 7 grams of L-glutamine; supplement contains CaHMB, Vitamins C, E, B12 and Zinc to promote wound healing.  Continue vanilla Magic cup TID with meals, each supplement provides 290 kcal and 9 grams of protein  Continue vanilla Vital Cuisine Shake TID, each supplement provides 520 kcal and 22 grams of protein  Continue MVI, vitamin C, and zinc.  NUTRITION DIAGNOSIS:   Increased nutrient needs related to wound healing, acute illness as evidenced by estimated needs.  Ongoing  GOAL:   Patient will meet greater than or equal to 90% of their needs  Progressing   MONITOR:   PO intake, Supplement acceptance, Labs, Weight trends, Skin, I & O's  REASON FOR ASSESSMENT:   Consult, Ventilator Enteral/tube feeding initiation and management  ASSESSMENT:   44 yo female admitted with DKA, necrotizing soft tissue infection of lower abdominal wall and upper thigh extending to perineum, septic shock. PMH includes DM, bipolar disorder, schizophrenia  Reviewed I/O's: -170 ml x 24 hours and +3.7 L since 07/03/19  UOP: 300 ml x 24 hours  Attempted to speak with pt via phone, however, no answer.   Per nursing notes, rt inguina wound dehiscing; plan to continue current dressing changes at this time.   Per prior RD notes, pt does not like Glucerna and Boost Breeze supplements, and they have been d/c. Pt is intermittently refusing Juven supplements. She is also receiving Magic Cups and Vital Cuisine shakes with her meal trays. Intake remains variable; noted meal completion 25-100%  Medications reviewed and include vitamin C  Per TOC team notes, awaiting PASSAR number. Pt has bed available at [facility].   Labs reviewed: CBGS: 87-154 (inpatient orders  for glycemic control are 0-5 units insulin aspart q HS, 0-9 units insulin aspart TID with meals, and 5 units insulin detemir daily).   Diet Order:   Diet Order            Diet heart healthy/carb modified Room service appropriate? Yes with Assist; Fluid consistency: Thin  Diet effective now              EDUCATION NEEDS:   Not appropriate for education at this time  Skin:  Skin Assessment: Skin Integrity Issues: Skin Integrity Issues:: Unstageable, Other (Comment) Unstageable: N/A Other: Necrotizing wound to lower abdominal wall and upper thigh extending to perineum  Last BM:  07/17/19  Height:   Ht Readings from Last 1 Encounters:  07/10/19 5\' 6"  (1.676 m)    Weight:   Wt Readings from Last 1 Encounters:  07/17/19 110.8 kg   BMI:  Body mass index is 39.43 kg/m.  Estimated Nutritional Needs:   Kcal:  2400-2700 kcals  Protein:  120-150 g  Fluid:  >/= 2 L    09/16/19, RD, LDN, CDCES Registered Dietitian II Certified Diabetes Care and Education Specialist Please refer to [facility] for RD and/or RD on-call/weekend/after hours pager

## 2019-07-17 NOTE — Progress Notes (Signed)
Received patient from 6E as transfer. Patient alert and oriented x4. Very flat affect stating she is ready to get out of the hospital. No complaints of pain at this time. Patient made comfortable in bed, call bell within reach. Will continue to monitor.

## 2019-07-17 NOTE — Progress Notes (Signed)
Physical Therapy Treatment Patient Details Name: Kimberly Orozco MRN: [id number] DOB: January 10, 1976 Today's Date: 07/17/2019    History of Present Illness 44 year old female admitted 06/23/19 with altered mental status and found to be in DKA with necrotizing soft tissue infection lower abdominal wall and upper RLE extending into perineum. Patient had extensive debridement by surgery 06/24/19 and developed shock requiring pressors. Repeat debridement 06/26/19, no longer requiring pressors and extubated 06/27/19. DKA resolved and patient transferred to hospitalist service 4/18, PCU 4/19. Plastic surgery consulted. Patient declining surgery at this time. PMH: schizophrenia, bipolar disorder, obesity and T2DM    PT Comments    Pt admitted with above diagnosis. Pt was able to ambulate with RW with min guard assist in room. Pt progressed distance. Some pain but better each day. Pt continues with flat affect.   Pt currently with functional limitations due to balance and endurance deficits. Pt will benefit from skilled PT to increase their independence and safety with mobility to allow discharge to the venue listed below.     Follow Up Recommendations  SNF;Supervision for mobility/OOB     Equipment Recommendations  Rolling walker with 5" wheels;3in1 (PT);Hospital bed;Other (comment)    Recommendations for Other Services       Precautions / Restrictions Precautions Precautions: Fall Precaution Comments: L PICC, HOB>30 degrees catheter, abdominal wound Restrictions Weight Bearing Restrictions: No    Mobility  Bed Mobility Overal bed mobility: Needs Assistance Bed Mobility: Supine to Sit     Supine to sit: Min assist     General bed mobility comments: requires assist for moving LEs out of bed and assist to raise trunk to seated position  Transfers Overall transfer level: Needs assistance Equipment used: Rolling walker (2 wheeled) Transfers: Sit to/from Stand Sit to Stand: Min assist;From  elevated surface         General transfer comment: Attempted to stand from low bed initially x 2 attempts and was unable to get to standing, with bed slightly elevated she was able to get to standing  Ambulation/Gait Ambulation/Gait assistance: Min guard Gait Distance (Feet): 55 Feet Assistive device: Rolling walker (2 wheeled) Gait Pattern/deviations: Step-to pattern;Decreased stride length Gait velocity: decreased Gait velocity interpretation: <1.31 ft/sec, indicative of household ambulator General Gait Details: Patient ambulated in room doing laps as she did not want to go in hallway. Cues for safety with use of AD.   Stairs             Wheelchair Mobility    Modified Rankin (Stroke Patients Only)       Balance Overall balance assessment: Needs assistance Sitting-balance support: Feet supported Sitting balance-Leahy Scale: Good     Standing balance support: Bilateral upper extremity supported;During functional activity Standing balance-Leahy Scale: Fair Standing balance comment: Pt can stand statically without UE support briefly.  Pt dynamic stance with min guard assist to min assist.                             Cognition Arousal/Alertness: Awake/alert Behavior During Therapy: WFL for tasks assessed/performed Overall Cognitive Status: Within Functional Limits for tasks assessed                                        Exercises General Exercises - Lower Extremity Ankle Circles/Pumps: AROM;Both;10 reps;Supine Long Arc Quad: AROM;Both;10 reps;Seated Hip Flexion/Marching: AROM;Both;10 reps;Seated  General Comments        Pertinent Vitals/Pain Pain Assessment: Faces Faces Pain Scale: Hurts even more Pain Location: abdomen Pain Descriptors / Indicators: Discomfort Pain Intervention(s): Limited activity within patient's tolerance;Monitored during session;Repositioned    Home Living                      Prior  Function            PT Goals (current goals can now be found in the care plan section) Acute Rehab PT Goals Patient Stated Goal: to do things for herself Potential to Achieve Goals: Fair Progress towards PT goals: Progressing toward goals    Frequency    Min 3X/week      PT Plan Current plan remains appropriate    Co-evaluation              AM-PAC PT "6 Clicks" Mobility   Outcome Measure  Help needed turning from your back to your side while in a flat bed without using bedrails?: A Little Help needed moving from lying on your back to sitting on the side of a flat bed without using bedrails?: A Little Help needed moving to and from a bed to a chair (including a wheelchair)?: A Little Help needed standing up from a chair using your arms (e.g., wheelchair or bedside chair)?: A Little Help needed to walk in hospital room?: A Little Help needed climbing 3-5 steps with a railing? : A Lot 6 Click Score: 17    End of Session Equipment Utilized During Treatment: Gait belt Activity Tolerance: Patient limited by fatigue Patient left: in chair;with chair alarm set;with call bell/phone within reach Nurse Communication: Mobility status PT Visit Diagnosis: Other abnormalities of gait and mobility (R26.89);Muscle weakness (generalized) (M62.81);Pain Pain - part of body: (abdomen)     Time: 2130-8657 PT Time Calculation (min) (ACUTE ONLY): 14 min  Charges:  $Gait Training: 8-22 mins                     Taksh Hjort W,PT Acute Rehabilitation Services Pager:  [phone]  Office:  [phone]     Berline Lopes 07/17/2019, 1:53 PM

## 2019-07-17 NOTE — Progress Notes (Signed)
Pt had very little output this am in foley, leaking and voiding around it last night. MD asked to removed foley and do voiding trial. Foley removed and pt requesting bedpan. Pt made aware of transfer to [address]. Kimberly Orozco.

## 2019-07-18 LAB — GLUCOSE, CAPILLARY
Glucose-Capillary: 100 mg/dL — ABNORMAL HIGH (ref 70–99)
Glucose-Capillary: 101 mg/dL — ABNORMAL HIGH (ref 70–99)
Glucose-Capillary: 101 mg/dL — ABNORMAL HIGH (ref 70–99)
Glucose-Capillary: 102 mg/dL — ABNORMAL HIGH (ref 70–99)
Glucose-Capillary: 120 mg/dL — ABNORMAL HIGH (ref 70–99)
Glucose-Capillary: 91 mg/dL (ref 70–99)

## 2019-07-18 NOTE — TOC Progression Note (Signed)
Transition of Care [facility]) - Progression Note    Patient Details  Name: Mckinzee Spirito MRN: [id number] Date of Birth: 1975-04-17  Transition of [facility]) CM/SW Contact  Doy Hut[place], [place] Phone Number: 07/18/2019, 2:43 PM  Clinical Narrative:    Pt PASRR review completed by [facility], under review. Pt w/ ongoing medical work up.    Expected Discharge Plan: Skilled Nursing Facility Barriers to Discharge: Continued Medical Work up  Expected Discharge Plan and Services Expected Discharge Plan: Skilled Nursing Facility Living arrangements for the past 2 months: Single Family Home  Readmission Risk Interventions No flowsheet data found.

## 2019-07-18 NOTE — Progress Notes (Signed)
10 Days Post-Op  Subjective: Patient is a 44 year old female who underwent debridement of abdominal and right thigh wound, closure of right and left abdominal wounds, and application of mesh Primatrix to the right thigh covered by Mepitel on 07/08/19 with Dr. Arita Miss.  Today Patient is lying in bed with nursing changing her dressing. Patient reports she feels okay. Denies fevers and N/V. Left incision is intact. Right incision has some dehiscence. Mepitel is still covering her right thigh wound, good pink tissue visible underneath. No signs of infection.   Objective: Vital signs in last 24 hours: Temp:  [97.7 F (36.5 C)-98.9 F (37.2 C)] 98.6 F (37 C) (05/07 0402) Pulse Rate:  [84-87] 85 (05/07 0402) Resp:  [17-20] 17 (05/07 0402) BP: (103-118)/(56-67) 108/56 (05/07 0402) SpO2:  [97 %-100 %] 100 % (05/07 0402) Weight:  [111.5 kg] 111.5 kg (05/07 0402) Last BM Date: 07/17/19  Intake/Output from previous day: No intake/output data recorded. Intake/Output this shift: No intake/output data recorded.  General appearance: alert, cooperative, no distress and flat affect with a little slower responses today Head: Normocephalic, without obvious abnormality, atraumatic Eyes: EOMs intact Resp: nonlabored Incision/Wound: Left incision intact. Right incision has some dehiscence. Right thigh wound has Mepitel in place with good pink tissue visible underneath. No signs of infection.  Lab Results:   CBC    Component Value Date/Time   WBC 10.5 07/16/2019 0437   RBC 2.92 (L) 07/16/2019 0437   HGB 8.2 (L) 07/16/2019 0437   HCT 27.3 (L) 07/16/2019 0437   PLT 443 (H) 07/16/2019 0437   MCV 93.5 07/16/2019 0437   MCH 28.1 07/16/2019 0437   MCHC 30.0 07/16/2019 0437   RDW 17.3 (H) 07/16/2019 0437   LYMPHSABS 0.7 06/23/2019 1500   MONOABS 1.6 (H) 06/23/2019 1500   EOSABS 0.0 06/23/2019 1500   BASOSABS 0.0 06/23/2019 1500    BMET Recent Labs    07/16/19 0437  NA 137  K 3.9  CL 102  CO2  28  GLUCOSE 94  BUN 6  CREATININE 0.47  CALCIUM 8.0*   PT/INR No results for input(s): LABPROT, INR in the last 72 hours. ABG No results for input(s): PHART, HCO3 in the last 72 hours.  Invalid input(s): PCO2, PO2  Studies/Results: No results found.  Anti-infectives: Anti-infectives (From admission, onward)   Start     Dose/Rate Route Frequency Ordered Stop   07/12/19 1000  amoxicillin-clavulanate (AUGMENTIN) 875-125 MG per tablet 1 tablet     1 tablet Oral Every 12 hours 07/12/19 0740 07/19/19 0959   07/09/19 0200  clindamycin (CLEOCIN) IVPB 900 mg  Status:  Discontinued     900 mg 100 mL/hr over 30 Minutes Intravenous Every 8 hours 07/09/19 0034 07/11/19 0812   07/08/19 2245  vancomycin (VANCOCIN) IVPB 1000 mg/200 mL premix  Status:  Discontinued     1,000 mg 200 mL/hr over 60 Minutes Intravenous Every 12 hours 07/08/19 2231 07/12/19 0740   07/08/19 2245  ceFEPIme (MAXIPIME) 2 g in sodium chloride 0.9 % 100 mL IVPB  Status:  Discontinued     2 g 200 mL/hr over 30 Minutes Intravenous Every 8 hours 07/08/19 2231 07/12/19 0740   07/08/19 2215  ceFEPIme (MAXIPIME) 2 g in sodium chloride 0.9 % 100 mL IVPB  Status:  Discontinued     2 g 200 mL/hr over 30 Minutes Intravenous  Once 07/08/19 2210 07/09/19 0000   07/08/19 2215  metroNIDAZOLE (FLAGYL) IVPB 500 mg  Status:  Discontinued  500 mg 100 mL/hr over 60 Minutes Intravenous Every 8 hours 07/08/19 2210 07/09/19 0846   07/08/19 2215  vancomycin (VANCOCIN) IVPB 1000 mg/200 mL premix  Status:  Discontinued     1,000 mg 200 mL/hr over 60 Minutes Intravenous  Once 07/08/19 2210 07/09/19 0001   07/08/19 1115  ceFAZolin (ANCEF) IVPB 2g/100 mL premix     2 g 200 mL/hr over 30 Minutes Intravenous On call to O.R. 07/08/19 1104 07/08/19 1330   06/26/19 1200  Vancomycin (VANCOCIN) 1,500 mg in sodium chloride 0.9 % 500 mL IVPB  Status:  Discontinued     1,500 mg 250 mL/hr over 120 Minutes Intravenous Every 24 hours 06/25/19 1153  06/26/19 0455   06/26/19 1200  vancomycin (VANCOREADY) IVPB 1500 mg/300 mL  Status:  Discontinued     1,500 mg 150 mL/hr over 120 Minutes Intravenous Every 24 hours 06/26/19 0455 06/26/19 0730   06/26/19 0830  vancomycin (VANCOREADY) IVPB 1500 mg/300 mL  Status:  Discontinued     1,000 mg 100 mL/hr over 120 Minutes Intravenous Every 12 hours 06/26/19 0730 06/26/19 0741   06/26/19 0830  vancomycin (VANCOCIN) IVPB 1000 mg/200 mL premix  Status:  Discontinued     1,000 mg 200 mL/hr over 60 Minutes Intravenous Every 12 hours 06/26/19 0742 07/04/19 1238   06/25/19 1145  vancomycin (VANCOCIN) 500 mg in sodium chloride 0.9 % 100 mL IVPB     500 mg 100 mL/hr over 60 Minutes Intravenous NOW 06/25/19 1055 06/25/19 1241   06/24/19 1600  vancomycin (VANCOREADY) IVPB 750 mg/150 mL  Status:  Discontinued     750 mg 150 mL/hr over 60 Minutes Intravenous Every 12 hours 06/23/19 1700 06/24/19 0714   06/24/19 1600  vancomycin (VANCOCIN) IVPB 1000 mg/200 mL premix  Status:  Discontinued     1,000 mg 200 mL/hr over 60 Minutes Intravenous Every 12 hours 06/24/19 0714 06/24/19 0750   06/24/19 1400  clindamycin (CLEOCIN) IVPB 900 mg  Status:  Discontinued     900 mg 100 mL/hr over 30 Minutes Intravenous Every 8 hours 06/24/19 0756 06/29/19 1137   06/24/19 1145  ceFEPIme (MAXIPIME) 2 g in sodium chloride 0.9 % 100 mL IVPB  Status:  Discontinued     2 g 200 mL/hr over 30 Minutes Intravenous To ShortStay Surgical 06/24/19 1131 06/24/19 1457   06/24/19 1030  ceFEPIme (MAXIPIME) 2 g in sodium chloride 0.9 % 100 mL IVPB  Status:  Discontinued     2 g 200 mL/hr over 30 Minutes Intravenous Every 8 hours 06/24/19 0714 07/04/19 1238   06/24/19 0800  vancomycin (VANCOCIN) IVPB 1000 mg/200 mL premix  Status:  Discontinued     1,000 mg 200 mL/hr over 60 Minutes Intravenous Every 12 hours 06/24/19 0750 06/25/19 1153   06/24/19 0300  ceFEPIme (MAXIPIME) 2 g in sodium chloride 0.9 % 100 mL IVPB  Status:  Discontinued     2  g 200 mL/hr over 30 Minutes Intravenous Every 12 hours 06/23/19 1700 06/24/19 0714   06/23/19 1700  clindamycin (CLEOCIN) IVPB 600 mg  Status:  Discontinued     600 mg 100 mL/hr over 30 Minutes Intravenous Every 8 hours 06/23/19 1649 06/24/19 0756   06/23/19 1500  vancomycin (VANCOREADY) IVPB 2000 mg/400 mL     2,000 mg 200 mL/hr over 120 Minutes Intravenous  Once 06/23/19 1454 06/23/19 1747   06/23/19 1445  ceFEPIme (MAXIPIME) 2 g in sodium chloride 0.9 % 100 mL IVPB     2  g 200 mL/hr over 30 Minutes Intravenous  Once 06/23/19 1434 06/23/19 1557   06/23/19 1445  metroNIDAZOLE (FLAGYL) IVPB 500 mg     500 mg 100 mL/hr over 60 Minutes Intravenous  Once 06/23/19 1434 06/23/19 1608   06/23/19 1445  vancomycin (VANCOCIN) IVPB 1000 mg/200 mL premix  Status:  Discontinued     1,000 mg 200 mL/hr over 60 Minutes Intravenous  Once 06/23/19 1434 06/23/19 1440   06/23/19 1445  vancomycin (VANCOCIN) 2,000 mg in sodium chloride 0.9 % 500 mL IVPB  Status:  Discontinued     2,000 mg 250 mL/hr over 120 Minutes Intravenous  Once 06/23/19 1440 06/23/19 1454      Assessment/Plan: s/p Procedure(s): Debridement of abdominal and groin wound with placement of primatrix AG  Continue with dressing changes as needed (minimum once a day). Right thigh wound: Thin layer of surgical lube to cover Mepitel mesh, cover with Kerlix or other absorbent gauze and ABDs, secure with tape. (Note: Mepitel should be moist, but not soaking wet.) Right and left abdominal incisions: Covered with Xeroform and ABD, secure with tape.  Plan to return to the OR on 5/11 for possible split thickness skin graft to right thigh wound.   LOS: 25 days    Threasa Heads, PA-C 07/18/2019

## 2019-07-18 NOTE — Progress Notes (Signed)
PROGRESS NOTE    Kimberly Orozco  UUV:[id number] DOB: 18-Aug-1975 DOA: 06/23/2019 PCP: Patient, No Pcp Per   Brief Narrative:  44 year old with history of DM2, schizophrenia, bipolar disorder admitted with DKA in the setting of necrotizing fasciitis of the right groin, left inguinal crease and right medial thigh.  She underwent debridement on 4/13 and then again on 4/15.  Initially intubated on 4/12, thereafter extubated on 4/16 and transferred out of the ICU on 4/17.  Thoracic surgery team was consulted, another debridement was performed on 4/27 with estimated blood loss 450 cc.  On 4/28 patient again became febrile, hypotensive oligoanuric requiring transfer to the ICU.  Has been receiving multiple course of antibiotics.  ID was consulted, antibiotics were consolidated down to Augmentin. Due to Abd pain, repeat Ab/Pelvis was performed which showed small abscess in the left ground and concerns of acute cholecystitis. Gen surgery reconsulted, RUQ [place] confirmed Acute Cholecystitis, HIDA scan done - results unremarkable as below.  Patient having issues with wound dehiscence currently -being evaluated for skin grafting on 07/22/2019.   Assessment & Plan:   Active Problems:   DKA (diabetic ketoacidoses) (HCC)   Necrotizing cellulitis   Septic shock (HCC)   AKI (acute kidney injury) (HCC)   Acute respiratory failure (HCC)   Leukocytosis   Septic shock secondary to necrotizing fasciitis of abdominal wound, perineum and the right thigh Persistent abdominal pain - Right inguinal wound dehisced overnight - Plastics aware:           Tentative plan for split thickness skin graft to right thigh wound on 5/11. - Shock physiology improved - Per ID on Augmentin, last day 5/7 - Continue to follow culture data. - Vitamin C, zinc, supplemental boost - Wound care per plastic surgery - Foley removed - leaking/obstruction/overflow previously - defeating the purpose of initial reason for placement (which was to  keep bandages clean).  3.5 cm subcutaneous abscess in the left inguinal region -General surgery- conservative management for now.   Cholelithiasis with gallbladder wall thickening Questionable acute cholecystitis -Suspicion for acute cholecystitis.RUQ [place] concerning as well. HIDA scan done given atypical symptoms - results remarkable for patent ducts, will continue to follow clinically, no indication for surgery at this time  Urine leakage; improved.  -CT A/P = no fistula seen - Foley removed previously given concern for overflow/obstruction   Oliguria Anasarca/hypoalbuminemia, ongoing -Catheter pulled 07/17/19 - issues with obstruction/inability to flush -Encourage increasing diet, follow I's and O's - none recorded over past 24h.  Right common femoral vein filling defect -Suspect secondary to extensive surgery.  Unable to obtain ultrasound of this region given the extent of superficial problems given above.  Acute blood loss anemia, stable -Requiring frequent transfusions 3 U so far (4/18; 4/25; 4/28).  Maintain hemoglobin greater than 7.  Ambulatory dysfunction  -Likely secondary to above, acute infection, weight gain, edema and prolonged hospitalization -PT evaluating initially recommending CIR placement however patient does not meet CIR criteria, pending SNF placement at this time  Sinus tachycardia; resolved  -resolved with resumption of Propranolol  Diabetes mellitus type 2, fairly well controlled now -Sliding scale -Levemir 5 units daily  History of schizophrenia with bipolar disorder -Continue Haldol, valproate and Cogentin  Obesity with BMI greater than 35 -Weight loss diet and exercise  DVT prophylaxis: SCDs Code Status: Full code Family Communication:    Status is: Inpatient  Remains inpatient appropriate because:Hemodynamically unstable.  Still getting wound care dressing, oral antibiotics and management for generalized abdominal pain Dispo: The  patient is  from: Home              Anticipated d/c is to: SNF              Anticipated d/c date is: > 3 days              Patient currently is not medically stable to d/c.  Plastic surgery pending repeat surgery 07/22/19  Subjective: No acute issues or events overnight, otherwise declines fevers, chills, nausea, vomiting, diarrhea, constipation, headache.  Examination: Constitutional: Not in acute distress Respiratory: Clear to auscultation bilaterally Cardiovascular: Normal sinus rhythm, no rubs Abdomen: Nontender nondistended good bowel sounds Musculoskeletal: No edema noted Skin: b/l groin dressing notes.  Neurologic: CN 2-12 grossly intact.  And nonfocal Psychiatric: Normal judgment and insight. Alert and oriented x 3. Normal mood.    PICC line 07/09/2019--> ongoing Foley catheter 06/24/2019-->07/17/19  Objective: Vitals:   07/17/19 1231 07/17/19 1719 07/17/19 2002 07/18/19 0402  BP: 118/67 103/64 112/61 (!) 108/56  Pulse: 84 85 87 85  Resp: 20 18 17 17   Temp: 98.9 F (37.2 C) 98.4 F (36.9 C) 97.7 F (36.5 C) 98.6 F (37 C)  TempSrc: Oral Oral Oral Oral  SpO2: 97% 98% 98% 100%  Weight:    111.5 kg  Height:       No intake or output data in the 24 hours ending 07/18/19 0758 Filed Weights   07/16/19 0500 07/17/19 0422 07/18/19 0402  Weight: 112.4 kg 110.8 kg 111.5 kg   Data Reviewed:   CBC: Recent Labs  Lab 07/12/19 0500 07/13/19 0248 07/14/19 0500 07/15/19 0456 07/16/19 0437  WBC 6.3 7.8 8.8 10.6* 10.5  HGB 7.9* 8.1* 8.3* 8.6* 8.2*  HCT 26.2* 26.7* 27.7* 28.5* 27.3*  MCV 90.7 89.6 90.2 92.5 93.5  PLT 433* 437* 431* 448* 025*   Basic Metabolic Panel: Recent Labs  Lab 07/12/19 0500 07/13/19 0248 07/14/19 0500 07/15/19 0456 07/16/19 0437  NA 140 141 140 141 137  K 3.6 3.8 4.1 3.9 3.9  CL 107 107 104 105 102  CO2 26 27 27 29 28   GLUCOSE 104* 103* 123* 122* 94  BUN 9 7 5* 8 6  CREATININE 0.51 0.53 0.49 0.46 0.47  CALCIUM 7.7* 8.0* 7.8* 8.0* 8.0*  MG 1.7 1.5*  1.7 1.4* 1.7   GFR: Estimated Creatinine Clearance: 113.6 mL/min (by C-G formula based on SCr of 0.47 mg/dL). Liver Function Tests: Recent Labs  Lab 07/14/19 1331 07/15/19 0456 07/16/19 0437  AST 18 14* 11*  ALT 8 10 7   ALKPHOS 86 82 84  BILITOT 0.4 0.4 0.2*  PROT 5.7* 5.5* 5.3*  ALBUMIN 1.6* 1.5* 1.4*   No results for input(s): LIPASE, AMYLASE in the last 168 hours. No results for input(s): AMMONIA in the last 168 hours. Coagulation Profile: No results for input(s): INR, PROTIME in the last 168 hours. Cardiac Enzymes: No results for input(s): CKTOTAL, CKMB, CKMBINDEX, TROPONINI in the last 168 hours. BNP (last 3 results) No results for input(s): PROBNP in the last 8760 hours. HbA1C: No results for input(s): HGBA1C in the last 72 hours. CBG: Recent Labs  Lab 07/17/19 1123 07/17/19 1711 07/17/19 1959 07/18/19 0037 07/18/19 0358  GLUCAP 154* 104* 107* 101* 101*   Lipid Profile: No results for input(s): CHOL, HDL, LDLCALC, TRIG, CHOLHDL, LDLDIRECT in the last 72 hours. Thyroid Function Tests: No results for input(s): TSH, T4TOTAL, FREET4, T3FREE, THYROIDAB in the last 72 hours. Anemia Panel: No results for input(s): VITAMINB12, FOLATE,  FERRITIN, TIBC, IRON, RETICCTPCT in the last 72 hours. Sepsis Labs: No results for input(s): PROCALCITON, LATICACIDVEN in the last 168 hours.  Recent Results (from the past 240 hour(s))  Culture, blood (x 2)     Status: None   Collection Time: 07/08/19 10:42 PM   Specimen: BLOOD  Result Value Ref Range Status   Specimen Description BLOOD RIGHT ARM  Final   Special Requests   Final    BOTTLES DRAWN AEROBIC AND ANAEROBIC Blood Culture results may not be optimal due to an excessive volume of blood received in culture bottles   Culture   Final    NO GROWTH 5 DAYS Performed at [facility], [address] [place], [place] [address]    Report Status 07/13/2019 FINAL  Final  Culture, blood (x 2)     Status: None   Collection  Time: 07/08/19 10:50 PM   Specimen: BLOOD  Result Value Ref Range Status   Specimen Description BLOOD RIGHT HAND  Final   Special Requests   Final    BOTTLES DRAWN AEROBIC ONLY Blood Culture results may not be optimal due to an excessive volume of blood received in culture bottles   Culture   Final    NO GROWTH 5 DAYS Performed at [facility], [address] [place], [place] [address]    Report Status 07/13/2019 FINAL  Final     Radiology Studies: No results found.   Scheduled Meds: . sodium chloride   Intravenous Once  . acetaminophen  1,000 mg Oral Q6H  . amoxicillin-clavulanate  1 tablet Oral Q12H  . vitamin C  500 mg Oral BID  . benztropine  1 mg Oral BID  . Chlorhexidine Gluconate Cloth  6 each Topical Daily  . Gerhardt's butt cream   Topical BID  . haloperidol  10 mg Oral q morning - 10a  . haloperidol  20 mg Oral QHS  . insulin aspart  0-5 Units Subcutaneous QHS  . insulin aspart  0-9 Units Subcutaneous TID WC  . insulin detemir  5 Units Subcutaneous Daily  . multivitamin with minerals  1 tablet Oral Daily  . nutrition supplement (JUVEN)  1 packet Oral BID BM  . pantoprazole  40 mg Oral Daily  . propranolol ER  80 mg Oral Daily  . sodium chloride flush  10-40 mL Intracatheter Q12H  . sodium chloride flush  10-40 mL Intracatheter Q12H  . temazepam  30 mg Oral QHS  . valproic acid  250 mg Oral BID  . zinc sulfate  220 mg Oral Daily   Continuous Infusions: . sodium chloride 10 mL/hr at 07/10/19 0626  . lactated ringers 75 mL/hr at 07/18/19 0320     LOS: 25 days   Time spent > 35 mins  Azucena Fallen, DO [facility]  If 7PM-7AM, please contact night-coverage  07/18/2019, 7:58 AM

## 2019-07-18 NOTE — Progress Notes (Signed)
Physical Therapy Treatment Patient Details Name: Kimberly Orozco MRN: [id number] DOB: 1976/02/18 Today's Date: 07/18/2019    History of Present Illness 44 year old female admitted 06/23/19 with altered mental status and found to be in DKA with necrotizing soft tissue infection lower abdominal wall and upper RLE extending into perineum. Patient had extensive debridement by surgery 06/24/19 and developed shock requiring pressors. Repeat debridement 06/26/19, no longer requiring pressors and extubated 06/27/19. DKA resolved and patient transferred to hospitalist service 4/18, PCU 4/19. Plastic surgery consulted. Plastic sx tentatively planned 5/11. PMH: schizophrenia, bipolar disorder, obesity and T2DM    PT Comments    Pt demonstrates improvement through increased amb distance out to hallway during session min guard RW. Pt requires consistent verbal cues for safety with use of DME and transfers. Educated Pt on LE seated therex and importance of movement for increasing LE strength for functional mobility. Pt continues to be limited by generalized weakness, decreased activity tolerance, poor problem solving; high risk for falls. Continue to recommend SNF level therapies to to maximize functional mobility and improve independence level until safe to return home. Pt will continue to benefit from physical therapy while in acute care setting to address generalized weakness and decreased functional mobility for greater independence until d/c to next level of care.  Follow Up Recommendations  SNF;Supervision for mobility/OOB     Equipment Recommendations  Rolling walker with 5" wheels;3in1 (PT);Hospital bed;Other (comment)    Recommendations for Other Services       Precautions / Restrictions Precautions Precautions: Fall Precaution Comments: L PICC, abdominal wound Restrictions Weight Bearing Restrictions: No    Mobility  Bed Mobility Overal bed mobility: Needs Assistance Bed Mobility: Supine to  Sit     Supine to sit: Min guard Sit to supine: HOB elevated      Transfers Overall transfer level: Needs assistance Equipment used: Rolling walker (2 wheeled) Transfers: Sit to/from Stand Sit to Stand: From elevated surface;Min guard         General transfer comment: Attempted to stand from lower bed, Pt was unable. Raised bed, Pt able to stand min guard  Ambulation/Gait Ambulation/Gait assistance: Min guard Gait Distance (Feet): 86 Feet Assistive device: Rolling walker (2 wheeled) Gait Pattern/deviations: Step-to pattern;Decreased stride length;Trunk flexed Gait velocity: decreased   General Gait Details: Pt agreeable to amb in hallway. Took 2 rest breaks throughout amb bout. Amb from bed to bathroom, bathroom to recliner, recliner to hallway and back.   Stairs             Wheelchair Mobility    Modified Rankin (Stroke Patients Only)       Balance Overall balance assessment: Needs assistance Sitting-balance support: Feet supported Sitting balance-Leahy Scale: Good     Standing balance support: Bilateral upper extremity supported;During functional activity Standing balance-Leahy Scale: Fair Standing balance comment: Pt can stand statically without UE support briefly.                            Cognition Arousal/Alertness: Awake/alert Behavior During Therapy: Flat affect Overall Cognitive Status: No family/caregiver present to determine baseline cognitive functioning Area of Impairment: Attention;Following commands;Safety/judgement;Awareness;Problem solving                   Current Attention Level: Sustained   Following Commands: Follows multi-step commands consistently;Follows multi-step commands with increased time Safety/Judgement: Decreased awareness of safety;Decreased awareness of deficits   Problem Solving: Slow processing;Decreased initiation;Requires verbal cues General Comments:  Pt alert for session, motivated to amb in  hallway. Pt required verbal cues for hand placement on RW, sit to stand and amb while negotiating room. Pt required increased time for movement and was inconsistent with requests.      Exercises Total Joint Exercises Ankle Circles/Pumps: AROM;10 reps;Both;Seated Long Arc Quad: AROM;10 reps;Both;Seated    General Comments General comments (skin integrity, edema, etc.): Pt appeared to have peeling skin on hands, wounds on legs, abdomen. Pt required verbal cues for hand placement on RW, sit to stand and amb while negotiating room      Pertinent Vitals/Pain Pain Assessment: No/denies pain    Home Living                      Prior Function            PT Goals (current goals can now be found in the care plan section) Acute Rehab PT Goals Patient Stated Goal: to do things for herself PT Goal Formulation: With patient Time For Goal Achievement: 07/29/19 Potential to Achieve Goals: Good Progress towards PT goals: Progressing toward goals    Frequency    Min 3X/week      PT Plan Current plan remains appropriate    Co-evaluation              AM-PAC PT "6 Clicks" Mobility   Outcome Measure  Help needed turning from your back to your side while in a flat bed without using bedrails?: A Little Help needed moving from lying on your back to sitting on the side of a flat bed without using bedrails?: A Little Help needed moving to and from a bed to a chair (including a wheelchair)?: A Little Help needed standing up from a chair using your arms (e.g., wheelchair or bedside chair)?: A Little Help needed to walk in hospital room?: A Little Help needed climbing 3-5 steps with a railing? : A Lot 6 Click Score: 17    End of Session Equipment Utilized During Treatment: Gait belt Activity Tolerance: Patient tolerated treatment well Patient left: in chair;with chair alarm set;with call bell/phone within reach Nurse Communication: Mobility status;Other (comment)(Dressings  coming off) PT Visit Diagnosis: Other abnormalities of gait and mobility (R26.89);Muscle weakness (generalized) (M62.81);Pain     Time: 0981-1914 PT Time Calculation (min) (ACUTE ONLY): 24 min  Charges:  $Gait Training: 8-22 mins $Therapeutic Exercise: 8-22 mins                     [employer] SPT 07/18/2019    Sanjuana Letters 07/18/2019, 5:54 PM

## 2019-07-18 NOTE — Progress Notes (Signed)
Dressing soiled and new dressing applied. Pt tolerated well. Will continue to monitor.

## 2019-07-18 NOTE — Progress Notes (Signed)
Bed linens changed and patient's right abdominal dressing changed as well. Patient left resting and not in distress.

## 2019-07-19 ENCOUNTER — Other Ambulatory Visit: Payer: Self-pay

## 2019-07-19 LAB — COMPREHENSIVE METABOLIC PANEL WITH GFR
ALT: 10 U/L (ref 0–44)
AST: 15 U/L (ref 15–41)
Albumin: 1.6 g/dL — ABNORMAL LOW (ref 3.5–5.0)
Alkaline Phosphatase: 78 U/L (ref 38–126)
Anion gap: 10 (ref 5–15)
BUN: <5 mg/dL — ABNORMAL LOW (ref 6–20)
CO2: 28 mmol/L (ref 22–32)
Calcium: 8.2 mg/dL — ABNORMAL LOW (ref 8.9–10.3)
Chloride: 103 mmol/L (ref 98–111)
Creatinine, Ser: 0.42 mg/dL — ABNORMAL LOW (ref 0.44–1.00)
GFR calc Af Amer: >60 mL/min (ref >60)
GFR calc non Af Amer: >60 mL/min (ref >60)
Glucose, Bld: 118 mg/dL — ABNORMAL HIGH (ref 70–99)
Potassium: 3.7 mmol/L (ref 3.5–5.1)
Sodium: 141 mmol/L (ref 135–145)
Total Bilirubin: 0.4 mg/dL (ref 0.3–1.2)
Total Protein: 5.9 g/dL — ABNORMAL LOW (ref 6.5–8.1)

## 2019-07-19 LAB — CBC
HCT: 29.3 % — ABNORMAL LOW (ref 36.0–46.0)
Hemoglobin: 8.7 g/dL — ABNORMAL LOW (ref 12.0–15.0)
MCH: 27.6 pg (ref 26.0–34.0)
MCHC: 29.7 g/dL — ABNORMAL LOW (ref 30.0–36.0)
MCV: 93 fL (ref 80.0–100.0)
Platelets: 454 10*3/uL — ABNORMAL HIGH (ref 150–400)
RBC: 3.15 MIL/uL — ABNORMAL LOW (ref 3.87–5.11)
RDW: 17.7 % — ABNORMAL HIGH (ref 11.5–15.5)
WBC: 8.9 10*3/uL (ref 4.0–10.5)
nRBC: 0 % (ref 0.0–0.2)

## 2019-07-19 LAB — GLUCOSE, CAPILLARY
Glucose-Capillary: 100 mg/dL — ABNORMAL HIGH (ref 70–99)
Glucose-Capillary: 105 mg/dL — ABNORMAL HIGH (ref 70–99)
Glucose-Capillary: 107 mg/dL — ABNORMAL HIGH (ref 70–99)
Glucose-Capillary: 114 mg/dL — ABNORMAL HIGH (ref 70–99)
Glucose-Capillary: 117 mg/dL — ABNORMAL HIGH (ref 70–99)
Glucose-Capillary: 99 mg/dL (ref 70–99)

## 2019-07-19 NOTE — Progress Notes (Signed)
PROGRESS NOTE    Kimberly Orozco  SWN:[id number] DOB: 1975/12/03 DOA: 06/23/2019 PCP: Patient, No Pcp Per   Brief Narrative:  44 year old with history of DM2, schizophrenia, bipolar disorder admitted with DKA in the setting of necrotizing fasciitis of the right groin, left inguinal crease and right medial thigh.  She underwent debridement on 4/13 and then again on 4/15.  Initially intubated on 4/12, thereafter extubated on 4/16 and transferred out of the ICU on 4/17.  Thoracic surgery team was consulted, another debridement was performed on 4/27 with estimated blood loss 450 cc.  On 4/28 patient again became febrile, hypotensive oligoanuric requiring transfer to the ICU.  Has been receiving multiple course of antibiotics.  ID was consulted, antibiotics were consolidated down to Augmentin. Due to Abd pain, repeat Ab/Pelvis was performed which showed small abscess in the left ground and concerns of acute cholecystitis. Gen surgery reconsulted, RUQ [place] confirmed Acute Cholecystitis, HIDA scan done - results unremarkable as below.  Patient having issues with wound dehiscence currently -being evaluated for skin grafting on 07/22/2019.  Assessment & Plan:   Active Problems:   DKA (diabetic ketoacidoses) (HCC)   Necrotizing cellulitis   Septic shock (HCC)   AKI (acute kidney injury) (HCC)   Acute respiratory failure (HCC)   Leukocytosis   Septic shock secondary to necrotizing fasciitis of abdominal wound, perineum and the right thigh Persistent abdominal pain - Right inguinal wound dehisced overnight - Plastics aware:           Tentative plan for split thickness skin graft to right thigh wound on 5/11. - Shock physiology improved - Per ID on Augmentin, last day 5/7 - Continue to follow culture data. - Vitamin C, zinc, supplemental boost - Wound care per plastic surgery - Foley removed - leaking/obstruction/overflow previously - defeating the purpose of initial reason for placement (which was to  keep bandages clean).  3.5 cm subcutaneous abscess in the left inguinal region -General surgery- conservative management for now.   Cholelithiasis with gallbladder wall thickening Questionable acute cholecystitis -Suspicion for acute cholecystitis.RUQ [place] concerning as well. HIDA scan done given atypical symptoms - results remarkable for patent ducts, will continue to follow clinically, no indication for surgery at this time  Urine leakage; improved.  -CT A/P = no fistula seen - Foley removed previously given concern for overflow/obstruction   Oliguria Anasarca/hypoalbuminemia, ongoing -Catheter pulled 07/17/19 - issues with obstruction/inability to flush -Encourage increasing diet, follow I's and O's - none recorded over past 24h.  Right common femoral vein filling defect -Suspect secondary to extensive surgery.  Unable to obtain ultrasound of this region given the extent of superficial problems given above.  Acute blood loss anemia, stable -Requiring frequent transfusions 3 U so far (4/18; 4/25; 4/28).  Maintain hemoglobin greater than 7.  Ambulatory dysfunction  -Likely secondary to above, acute infection, weight gain, edema and prolonged hospitalization -PT evaluating initially recommending CIR placement however patient does not meet CIR criteria, pending SNF placement at this time  Sinus tachycardia; resolved  -resolved with resumption of Propranolol  Diabetes mellitus type 2, fairly well controlled now -Sliding scale -Levemir 5 units daily  History of schizophrenia with bipolar disorder -Continue Haldol, valproate and Cogentin  Obesity with BMI greater than 35 -Weight loss diet and exercise  DVT prophylaxis: SCDs Code Status: Full code Family Communication:    Status is: Inpatient  Remains inpatient appropriate because:Hemodynamically unstable.  Still getting wound care dressing, oral antibiotics and management for generalized abdominal pain Dispo: The patient  is  from: Home              Anticipated d/c is to: SNF              Anticipated d/c date is: > 3 days              Patient currently is not medically stable to d/c.  Plastic surgery pending repeat surgery 07/22/19  Subjective: No acute issues or events overnight, declines fevers, chills, nausea, vomiting, diarrhea, constipation, headache.  Examination: Constitutional: Not in acute distress Respiratory: Clear to auscultation bilaterally Cardiovascular: Normal sinus rhythm, no rubs Abdomen: Nontender nondistended good bowel sounds Musculoskeletal: No edema noted Skin: b/l groin dressing notes.  Neurologic: CN 2-12 grossly intact.  And nonfocal Psychiatric: Normal judgment and insight. Alert and oriented x 3. Normal mood.    PICC line 07/09/2019--> ongoing Foley catheter 06/24/2019-->07/17/19  Objective: Vitals:   07/18/19 1451 07/18/19 2012 07/19/19 0414 07/19/19 0500  BP: 115/77 129/81 126/75   Pulse: 86 91 88   Resp: 19 16 15    Temp: 98.7 F (37.1 C) 98.9 F (37.2 C) (!) 97.5 F (36.4 C)   TempSrc: Oral Oral Oral   SpO2: 99% 99% 100%   Weight:    110.4 kg  Height:        Intake/Output Summary (Last 24 hours) at 07/19/2019 0757 Last data filed at 07/19/2019 0500 Gross per 24 hour  Intake 2058.76 ml  Output 500 ml  Net 1558.76 ml   Filed Weights   07/17/19 0422 07/18/19 0402 07/19/19 0500  Weight: 110.8 kg 111.5 kg 110.4 kg   Data Reviewed:   CBC: Recent Labs  Lab 07/13/19 0248 07/14/19 0500 07/15/19 0456 07/16/19 0437  WBC 7.8 8.8 10.6* 10.5  HGB 8.1* 8.3* 8.6* 8.2*  HCT 26.7* 27.7* 28.5* 27.3*  MCV 89.6 90.2 92.5 93.5  PLT 437* 431* 448* 443*   Basic Metabolic Panel: Recent Labs  Lab 07/13/19 0248 07/14/19 0500 07/15/19 0456 07/16/19 0437  NA 141 140 141 137  K 3.8 4.1 3.9 3.9  CL 107 104 105 102  CO2 27 27 29 28   GLUCOSE 103* 123* 122* 94  BUN 7 5* 8 6  CREATININE 0.53 0.49 0.46 0.47  CALCIUM 8.0* 7.8* 8.0* 8.0*  MG 1.5* 1.7 1.4* 1.7   GFR:  Estimated Creatinine Clearance: 112.9 mL/min (by C-G formula based on SCr of 0.47 mg/dL). Liver Function Tests: Recent Labs  Lab 07/14/19 1331 07/15/19 0456 07/16/19 0437  AST 18 14* 11*  ALT 8 10 7   ALKPHOS 86 82 84  BILITOT 0.4 0.4 0.2*  PROT 5.7* 5.5* 5.3*  ALBUMIN 1.6* 1.5* 1.4*   No results for input(s): LIPASE, AMYLASE in the last 168 hours. No results for input(s): AMMONIA in the last 168 hours. Coagulation Profile: No results for input(s): INR, PROTIME in the last 168 hours. Cardiac Enzymes: No results for input(s): CKTOTAL, CKMB, CKMBINDEX, TROPONINI in the last 168 hours. BNP (last 3 results) No results for input(s): PROBNP in the last 8760 hours. HbA1C: No results for input(s): HGBA1C in the last 72 hours. CBG: Recent Labs  Lab 07/18/19 1625 07/18/19 2014 07/19/19 0015 07/19/19 0416 07/19/19 0744  GLUCAP 102* 91 100* 99 117*   Lipid Profile: No results for input(s): CHOL, HDL, LDLCALC, TRIG, CHOLHDL, LDLDIRECT in the last 72 hours. Thyroid Function Tests: No results for input(s): TSH, T4TOTAL, FREET4, T3FREE, THYROIDAB in the last 72 hours. Anemia Panel: No results for input(s): VITAMINB12, FOLATE, FERRITIN, TIBC,  IRON, RETICCTPCT in the last 72 hours. Sepsis Labs: No results for input(s): PROCALCITON, LATICACIDVEN in the last 168 hours.  No results found for this or any previous visit (from the past 240 hour(s)).   Radiology Studies: No results found.   Scheduled Meds: . sodium chloride   Intravenous Once  . acetaminophen  1,000 mg Oral Q6H  . vitamin C  500 mg Oral BID  . benztropine  1 mg Oral BID  . Chlorhexidine Gluconate Cloth  6 each Topical Daily  . Gerhardt's butt cream   Topical BID  . haloperidol  10 mg Oral q morning - 10a  . haloperidol  20 mg Oral QHS  . insulin aspart  0-5 Units Subcutaneous QHS  . insulin aspart  0-9 Units Subcutaneous TID WC  . insulin detemir  5 Units Subcutaneous Daily  . multivitamin with minerals  1 tablet  Oral Daily  . nutrition supplement (JUVEN)  1 packet Oral BID BM  . pantoprazole  40 mg Oral Daily  . propranolol ER  80 mg Oral Daily  . sodium chloride flush  10-40 mL Intracatheter Q12H  . sodium chloride flush  10-40 mL Intracatheter Q12H  . temazepam  30 mg Oral QHS  . valproic acid  250 mg Oral BID  . zinc sulfate  220 mg Oral Daily   Continuous Infusions: . sodium chloride 10 mL/hr at 07/10/19 0626  . lactated ringers 75 mL/hr at 07/19/19 0726     LOS: 26 days   Time spent > 35 mins  Little Ishikawa, DO [facility]  If 7PM-7AM, please contact night-coverage  07/19/2019, 7:57 AM

## 2019-07-20 LAB — GLUCOSE, CAPILLARY
Glucose-Capillary: 108 mg/dL — ABNORMAL HIGH (ref 70–99)
Glucose-Capillary: 155 mg/dL — ABNORMAL HIGH (ref 70–99)
Glucose-Capillary: 142 mg/dL — ABNORMAL HIGH (ref 70–99)
Glucose-Capillary: 105 mg/dL — ABNORMAL HIGH (ref 70–99)

## 2019-07-20 MED ORDER — ALTEPLASE 2 MG IJ SOLR: 2.0000 mg | Freq: Once | INTRAMUSCULAR | Status: DC

## 2019-07-20 NOTE — Plan of Care (Signed)
  Problem: Clinical Measurements: Goal: Ability to maintain clinical measurements within normal limits will improve Outcome: Progressing   Problem: Nutrition: Goal: Adequate nutrition will be maintained Outcome: Progressing   

## 2019-07-20 NOTE — Progress Notes (Signed)
PROGRESS NOTE    Kimberly Orozco  SWN:[id number] DOB: 1975/12/03 DOA: 06/23/2019 PCP: Patient, No Pcp Per   Brief Narrative:  44 year old with history of DM2, schizophrenia, bipolar disorder admitted with DKA in the setting of necrotizing fasciitis of the right groin, left inguinal crease and right medial thigh.  She underwent debridement on 4/13 and then again on 4/15.  Initially intubated on 4/12, thereafter extubated on 4/16 and transferred out of the ICU on 4/17.  Thoracic surgery team was consulted, another debridement was performed on 4/27 with estimated blood loss 450 cc.  On 4/28 patient again became febrile, hypotensive oligoanuric requiring transfer to the ICU.  Has been receiving multiple course of antibiotics.  ID was consulted, antibiotics were consolidated down to Augmentin. Due to Abd pain, repeat Ab/Pelvis was performed which showed small abscess in the left ground and concerns of acute cholecystitis. Gen surgery reconsulted, RUQ [place] confirmed Acute Cholecystitis, HIDA scan done - results unremarkable as below.  Patient having issues with wound dehiscence currently -being evaluated for skin grafting on 07/22/2019.  Assessment & Plan:   Active Problems:   DKA (diabetic ketoacidoses) (HCC)   Necrotizing cellulitis   Septic shock (HCC)   AKI (acute kidney injury) (HCC)   Acute respiratory failure (HCC)   Leukocytosis   Septic shock secondary to necrotizing fasciitis of abdominal wound, perineum and the right thigh Persistent abdominal pain - Right inguinal wound dehisced overnight - Plastics aware:           Tentative plan for split thickness skin graft to right thigh wound on 5/11. - Shock physiology improved - Per ID on Augmentin, last day 5/7 - Continue to follow culture data. - Vitamin C, zinc, supplemental boost - Wound care per plastic surgery - Foley removed - leaking/obstruction/overflow previously - defeating the purpose of initial reason for placement (which was to  keep bandages clean).  3.5 cm subcutaneous abscess in the left inguinal region -General surgery- conservative management for now.   Cholelithiasis with gallbladder wall thickening Questionable acute cholecystitis -Suspicion for acute cholecystitis.RUQ [place] concerning as well. HIDA scan done given atypical symptoms - results remarkable for patent ducts, will continue to follow clinically, no indication for surgery at this time  Urine leakage; improved.  -CT A/P = no fistula seen - Foley removed previously given concern for overflow/obstruction   Oliguria Anasarca/hypoalbuminemia, ongoing -Catheter pulled 07/17/19 - issues with obstruction/inability to flush -Encourage increasing diet, follow I's and O's - none recorded over past 24h.  Right common femoral vein filling defect -Suspect secondary to extensive surgery.  Unable to obtain ultrasound of this region given the extent of superficial problems given above.  Acute blood loss anemia, stable -Requiring frequent transfusions 3 U so far (4/18; 4/25; 4/28).  Maintain hemoglobin greater than 7.  Ambulatory dysfunction  -Likely secondary to above, acute infection, weight gain, edema and prolonged hospitalization -PT evaluating initially recommending CIR placement however patient does not meet CIR criteria, pending SNF placement at this time  Sinus tachycardia; resolved  -resolved with resumption of Propranolol  Diabetes mellitus type 2, fairly well controlled now -Sliding scale -Levemir 5 units daily  History of schizophrenia with bipolar disorder -Continue Haldol, valproate and Cogentin  Obesity with BMI greater than 35 -Weight loss diet and exercise  DVT prophylaxis: SCDs Code Status: Full code Family Communication:    Status is: Inpatient  Remains inpatient appropriate because:Hemodynamically unstable.  Still getting wound care dressing, oral antibiotics and management for generalized abdominal pain Dispo: The patient  is  from: Home              Anticipated d/c is to: SNF              Anticipated d/c date is: > 3 days              Patient currently is not medically stable to d/c.  Plastic surgery pending repeat surgery 07/22/19  Subjective: No acute issues or events overnight, declines fevers, chills, nausea, vomiting, diarrhea, constipation, headache.  Examination: Constitutional: Not in acute distress Respiratory: Clear to auscultation bilaterally Cardiovascular: Normal sinus rhythm, no rubs Abdomen: Nontender nondistended good bowel sounds Musculoskeletal: No edema noted Skin: b/l groin dressing per wound notes. Clean dry intact  Neurologic: CN 2-12 grossly intact.  And nonfocal Psychiatric: Normal judgment and insight. Alert and oriented x 3. Normal mood.    PICC line 07/09/2019--> ongoing Foley catheter 06/24/2019-->07/17/19  Objective: Vitals:   07/19/19 1356 07/19/19 2013 07/20/19 0420 07/20/19 0615  BP: (!) 93/59 117/77  117/76  Pulse: 90 79  82  Resp: 18 15  16   Temp: 98.8 F (37.1 C) 98.7 F (37.1 C)  98.2 F (36.8 C)  TempSrc: Oral Oral    SpO2: 100% 100%  100%  Weight:   109 kg   Height:        Intake/Output Summary (Last 24 hours) at 07/20/2019 0734 Last data filed at 07/20/2019 6073 Gross per 24 hour  Intake 2153.15 ml  Output --  Net 2153.15 ml   Filed Weights   07/18/19 0402 07/19/19 0500 07/20/19 0420  Weight: 111.5 kg 110.4 kg 109 kg   Data Reviewed:   CBC: Recent Labs  Lab 07/14/19 0500 07/15/19 0456 07/16/19 0437 07/19/19 0834  WBC 8.8 10.6* 10.5 8.9  HGB 8.3* 8.6* 8.2* 8.7*  HCT 27.7* 28.5* 27.3* 29.3*  MCV 90.2 92.5 93.5 93.0  PLT 431* 448* 443* 710*   Basic Metabolic Panel: Recent Labs  Lab 07/14/19 0500 07/15/19 0456 07/16/19 0437 07/19/19 0834  NA 140 141 137 141  K 4.1 3.9 3.9 3.7  CL 104 105 102 103  CO2 27 29 28 28   GLUCOSE 123* 122* 94 118*  BUN 5* 8 6 <5*  CREATININE 0.49 0.46 0.47 0.42*  CALCIUM 7.8* 8.0* 8.0* 8.2*  MG 1.7 1.4* 1.7   --    GFR: Estimated Creatinine Clearance: 112.2 mL/min (A) (by C-G formula based on SCr of 0.42 mg/dL (L)). Liver Function Tests: Recent Labs  Lab 07/14/19 1331 07/15/19 0456 07/16/19 0437 07/19/19 0834  AST 18 14* 11* 15  ALT 8 10 7 10   ALKPHOS 86 82 84 78  BILITOT 0.4 0.4 0.2* 0.4  PROT 5.7* 5.5* 5.3* 5.9*  ALBUMIN 1.6* 1.5* 1.4* 1.6*   No results for input(s): LIPASE, AMYLASE in the last 168 hours. No results for input(s): AMMONIA in the last 168 hours. Coagulation Profile: No results for input(s): INR, PROTIME in the last 168 hours. Cardiac Enzymes: No results for input(s): CKTOTAL, CKMB, CKMBINDEX, TROPONINI in the last 168 hours. BNP (last 3 results) No results for input(s): PROBNP in the last 8760 hours. HbA1C: No results for input(s): HGBA1C in the last 72 hours. CBG: Recent Labs  Lab 07/19/19 0416 07/19/19 0744 07/19/19 1201 07/19/19 1651 07/19/19 2010  GLUCAP 99 117* 114* 105* 107*   Lipid Profile: No results for input(s): CHOL, HDL, LDLCALC, TRIG, CHOLHDL, LDLDIRECT in the last 72 hours. Thyroid Function Tests: No results for input(s): TSH, T4TOTAL, FREET4, T3FREE,  THYROIDAB in the last 72 hours. Anemia Panel: No results for input(s): VITAMINB12, FOLATE, FERRITIN, TIBC, IRON, RETICCTPCT in the last 72 hours. Sepsis Labs: No results for input(s): PROCALCITON, LATICACIDVEN in the last 168 hours.  No results found for this or any previous visit (from the past 240 hour(s)).   Radiology Studies: No results found.   Scheduled Meds: . sodium chloride   Intravenous Once  . acetaminophen  1,000 mg Oral Q6H  . vitamin C  500 mg Oral BID  . benztropine  1 mg Oral BID  . Chlorhexidine Gluconate Cloth  6 each Topical Daily  . Gerhardt's butt cream   Topical BID  . haloperidol  10 mg Oral q morning - 10a  . haloperidol  20 mg Oral QHS  . insulin aspart  0-5 Units Subcutaneous QHS  . insulin aspart  0-9 Units Subcutaneous TID WC  . insulin detemir  5 Units  Subcutaneous Daily  . multivitamin with minerals  1 tablet Oral Daily  . nutrition supplement (JUVEN)  1 packet Oral BID BM  . pantoprazole  40 mg Oral Daily  . propranolol ER  80 mg Oral Daily  . sodium chloride flush  10-40 mL Intracatheter Q12H  . sodium chloride flush  10-40 mL Intracatheter Q12H  . temazepam  30 mg Oral QHS  . valproic acid  250 mg Oral BID  . zinc sulfate  220 mg Oral Daily   Continuous Infusions: . sodium chloride 10 mL/hr at 07/10/19 0626  . lactated ringers 75 mL/hr at 07/19/19 2115     LOS: 27 days   Time spent > 25 mins  Azucena Fallen, DO [facility]  If 7PM-7AM, please contact night-coverage  07/20/2019, 7:34 AM

## 2019-07-21 LAB — GLUCOSE, CAPILLARY
Glucose-Capillary: 95 mg/dL (ref 70–99)
Glucose-Capillary: 89 mg/dL (ref 70–99)
Glucose-Capillary: 107 mg/dL — ABNORMAL HIGH (ref 70–99)
Glucose-Capillary: 109 mg/dL — ABNORMAL HIGH (ref 70–99)

## 2019-07-21 MED ORDER — VANCOMYCIN HCL 1500 MG/300ML IV SOLN
1500.0000 mg | Freq: Once | INTRAVENOUS | Status: DC
  Filled 2019-07-21: qty 300

## 2019-07-21 NOTE — Progress Notes (Signed)
PT Cancellation Note  Patient Details Name: Kimberly Orozco MRN: [id number] DOB: 26-Jan-1976   Cancelled Treatment:    Reason Eval/Treat Not Completed: Patient declined, no reason specified.  States she does not want to do therapy now, will retry at another time.   Ivar Drape 07/21/2019, 10:46 AM   Samul Dada, PT MS Acute Rehab Dept. Number: [facility] [id number] and [facility] [phone]

## 2019-07-21 NOTE — Progress Notes (Signed)
Inpatient Diabetes Program Recommendations  AACE/ADA: New Consensus Statement on Inpatient Glycemic Control (2015)  Target Ranges:  Prepandial:   less than 140 mg/dL      Peak postprandial:   less than 180 mg/dL (1-2 hours)      Critically ill patients:  140 - 180 mg/dL   Lab Results  Component Value Date   GLUCAP 95 07/21/2019   HGBA1C 14.4 (H) 04/27/2019    Review of Glycemic Control Results for Kimberly Orozco, Kimberly Orozco (MRN [id number]) as of 07/21/2019 10:17  Ref. Range 07/19/2019 20:10 07/20/2019 08:15 07/20/2019 12:19 07/20/2019 16:54 07/20/2019 21:34 07/21/2019 07:43  Glucose-Capillary Latest Ref Range: 70 - 99 mg/dL 371 (H) 696 (H) 789 (H) 142 (H) 105 (H) 95   Diabetes history: DM 2 Outpatient Diabetes medications:  Novolog 4 units tid with meals Metformin 500 mg daily Current orders for Inpatient glycemic control:  Novolog sensitive tid with meals and HS Levemir 5 units daily Inpatient Diabetes Program Recommendations:    Blood sugars well controlled on current regimen.  This is not a new diagnosis for her.Diabetes coordinator spoke with patient in February, 2021, and she refused insulin.  Will have Diabetes coordinator speak to patient today as well.  Interestingly blood sugars are great? Note plans for d/c to SNF, which will be helpful in the short-term for monitoring, insulin, etc.  However she will need very close follow-up after d/c from SNF.   Thanks,  Beryl Meager, RN, BC-ADM Inpatient Diabetes Coordinator Pager [phone] (8a-5p)

## 2019-07-21 NOTE — TOC Progression Note (Signed)
Transition of Care [facility]) - Progression Note    Patient Details  Name: Kimberly Orozco MRN: [id number] Date of Birth: [birth year]  [facility] CM/SW Contact  Doy Hutching, [place] Phone Number:  07/21/2019, 11:36 AM  Clinical Narrative:    PASRR received, ongoing medical work up.  [facility] following for admissions when pt is stable. Will need new COVID prior to d/c.    Expected Discharge Plan: Skilled Nursing Facility Barriers to Discharge: Continued Medical Work up, Awaiting State Approval Financial controller)  Expected Discharge Plan and Services Expected Discharge Plan: Skilled Nursing Facility   Living arrangements for the past 2 months: Single Family Home     Readmission Risk Interventions Readmission Risk Prevention Plan 07/18/2019  Transportation Screening Complete  PCP or Specialist Appt within 5-7 Days Not Complete  Not Complete comments plan for SNF  Home Care Screening Complete  Medication Review (RN CM) Referral to Pharmacy  Some recent data might be hidden

## 2019-07-21 NOTE — Progress Notes (Signed)
PROGRESS NOTE    Kimberly Orozco  JQB:[id number] DOB: 04-Jul-1975 DOA: 06/23/2019 PCP: Patient, No Pcp Per   Brief Narrative:  44 year old with history of DM2, schizophrenia, bipolar disorder admitted with DKA in the setting of necrotizing fasciitis of the right groin, left inguinal crease and right medial thigh.  She underwent debridement on 4/13 and then again on 4/15.  Initially intubated on 4/12, thereafter extubated on 4/16 and transferred out of the ICU on 4/17.  Thoracic surgery team was consulted, another debridement was performed on 4/27 with estimated blood loss 450 cc.  On 4/28 patient again became febrile, hypotensive oligoanuric requiring transfer to the ICU.  Has been receiving multiple course of antibiotics.  ID was consulted, antibiotics were consolidated down to Augmentin. Due to Abd pain, repeat Ab/Pelvis was performed which showed small abscess in the left ground and concerns of acute cholecystitis. Gen surgery reconsulted, RUQ [place] confirmed Acute Cholecystitis, HIDA scan done - results unremarkable as below.  Patient having issues with wound dehiscence currently -being evaluated for skin grafting on 07/22/2019.  Assessment & Plan:   Active Problems:   DKA (diabetic ketoacidoses) (HCC)   Necrotizing cellulitis   Septic shock (HCC)   AKI (acute kidney injury) (HCC)   Acute respiratory failure (HCC)   Leukocytosis   Septic shock secondary to necrotizing fasciitis of abdominal wound, perineum and the right thigh Persistent abdominal pain - Right inguinal wound dehisced overnight - Plastics aware:           Tentative plan for split thickness skin graft to right thigh wound on 5/11. - Shock physiology improved - Per ID on Augmentin, last day 5/7 - Continue to follow culture data. - Vitamin C, zinc, supplement - Wound care per plastic surgery - Foley removed - leaking/obstruction/overflow previously - defeating the purpose of initial reason for placement (which was to keep  bandages clean).  3.5 cm subcutaneous abscess in the left inguinal region -General surgery following- conservative management of this area for now.   Cholelithiasis with gallbladder wall thickening Questionable acute cholecystitis -Suspicion for acute cholecystitis.RUQ [place] concerning as well. HIDA scan done given atypical symptoms - results remarkable for patent ducts, will continue to follow clinically, no indication for surgery at this time  Urine leakage; improved.  -CT A/P = no fistula seen - Foley removed previously given concern for overflow/obstruction   Oliguria Anasarca/hypoalbuminemia, ongoing -Catheter pulled 07/17/19 - issues with obstruction/inability to flush Foley at that time -Encourage increasing diet, follow I's and O's - difficulty recording given patient's complaints  Right common femoral vein filling defect -Suspect secondary to extensive surgery.  Unable to obtain ultrasound of this region given the extent of superficial problems given above.  Acute blood loss anemia, stable -Requiring frequent transfusions 3 U so far (4/18; 4/25; 4/28).  Maintain hemoglobin greater than 7.  Ambulatory dysfunction  -Likely secondary to above, acute infection, weight gain, edema and prolonged hospitalization -PT evaluating initially recommending CIR placement however patient does not meet CIR criteria, pending SNF placement at this time  Sinus tachycardia; resolved  -resolved with resumption of Propranolol  Diabetes mellitus type 2, fairly well controlled now -Sliding scale -Levemir 5 units daily  History of schizophrenia with bipolar disorder -Continue Haldol, valproate and Cogentin  Obesity with BMI greater than 35 -Weight loss diet and exercise  DVT prophylaxis: SCDs Code Status: Full code Family Communication:    Status is: Inpatient  Remains inpatient appropriate because:Hemodynamically unstable.  Still getting wound care dressing, oral antibiotics and management  for generalized abdominal pain Dispo: The patient is from: Home              Anticipated d/c is to: SNF              Anticipated d/c date is: > 3 days              Patient currently is not medically stable to d/c.  Plastic surgery pending repeat surgery 07/22/19  Subjective: No acute issues or events overnight, declines fevers, chills, nausea, vomiting, diarrhea, constipation, headache. Somewhat anxious for procedure tomorrow.  Examination: Constitutional: Not in acute distress Respiratory: Clear to auscultation bilaterally Cardiovascular: Normal sinus rhythm, no rubs Abdomen: Nontender nondistended good bowel sounds Musculoskeletal: No edema noted Skin: b/l groin dressing per wound notes. Clean dry intact  Neurologic: CN 2-12 grossly intact.  And nonfocal Psychiatric: Normal judgment and insight. Alert and oriented x 3. Normal mood.    PICC line 07/09/2019--> ongoing Foley catheter 06/24/2019-->07/17/19  Objective: Vitals:   07/20/19 0615 07/20/19 1531 07/20/19 2131 07/21/19 0454  BP: 117/76 113/63 137/87 109/77  Pulse: 82 86 85 87  Resp: 16 18 17 18   Temp: 98.2 F (36.8 C) 99.6 F (37.6 C) 98.2 F (36.8 C) 98.2 F (36.8 C)  TempSrc:  Oral Oral Oral  SpO2: 100% 100% 100% 99%  Weight:      Height:        Intake/Output Summary (Last 24 hours) at 07/21/2019 0730 Last data filed at 07/21/2019 0600 Gross per 24 hour  Intake 2075.1 ml  Output -  Net 2075.1 ml   Filed Weights   07/18/19 0402 07/19/19 0500 07/20/19 0420  Weight: 111.5 kg 110.4 kg 109 kg   Data Reviewed:   CBC: Recent Labs  Lab 07/15/19 0456 07/16/19 0437 07/19/19 0834  WBC 10.6* 10.5 8.9  HGB 8.6* 8.2* 8.7*  HCT 28.5* 27.3* 29.3*  MCV 92.5 93.5 93.0  PLT 448* 443* 381*   Basic Metabolic Panel: Recent Labs  Lab 07/15/19 0456 07/16/19 0437 07/19/19 0834  NA 141 137 141  K 3.9 3.9 3.7  CL 105 102 103  CO2 29 28 28   GLUCOSE 122* 94 118*  BUN 8 6 <5*  CREATININE 0.46 0.47 0.42*  CALCIUM 8.0*  8.0* 8.2*  MG 1.4* 1.7  --    GFR: Estimated Creatinine Clearance: 112.2 mL/min (A) (by C-G formula based on SCr of 0.42 mg/dL (L)). Liver Function Tests: Recent Labs  Lab 07/14/19 1331 07/15/19 0456 07/16/19 0437 07/19/19 0834  AST 18 14* 11* 15  ALT 8 10 7 10   ALKPHOS 86 82 84 78  BILITOT 0.4 0.4 0.2* 0.4  PROT 5.7* 5.5* 5.3* 5.9*  ALBUMIN 1.6* 1.5* 1.4* 1.6*   No results for input(s): LIPASE, AMYLASE in the last 168 hours. No results for input(s): AMMONIA in the last 168 hours. Coagulation Profile: No results for input(s): INR, PROTIME in the last 168 hours. Cardiac Enzymes: No results for input(s): CKTOTAL, CKMB, CKMBINDEX, TROPONINI in the last 168 hours. BNP (last 3 results) No results for input(s): PROBNP in the last 8760 hours. HbA1C: No results for input(s): HGBA1C in the last 72 hours. CBG: Recent Labs  Lab 07/19/19 2010 07/20/19 0815 07/20/19 1219 07/20/19 1654 07/20/19 2134  GLUCAP 107* 108* 155* 142* 105*   Lipid Profile: No results for input(s): CHOL, HDL, LDLCALC, TRIG, CHOLHDL, LDLDIRECT in the last 72 hours. Thyroid Function Tests: No results for input(s): TSH, T4TOTAL, FREET4, T3FREE, THYROIDAB in the last 72 hours.  Anemia Panel: No results for input(s): VITAMINB12, FOLATE, FERRITIN, TIBC, IRON, RETICCTPCT in the last 72 hours. Sepsis Labs: No results for input(s): PROCALCITON, LATICACIDVEN in the last 168 hours.  No results found for this or any previous visit (from the past 240 hour(s)).   Radiology Studies: No results found.   Scheduled Meds: . sodium chloride   Intravenous Once  . acetaminophen  1,000 mg Oral Q6H  . alteplase  2 mg Intracatheter Once  . alteplase  2 mg Intracatheter Once  . vitamin C  500 mg Oral BID  . benztropine  1 mg Oral BID  . Chlorhexidine Gluconate Cloth  6 each Topical Daily  . Gerhardt's butt cream   Topical BID  . haloperidol  10 mg Oral q morning - 10a  . haloperidol  20 mg Oral QHS  . insulin aspart   0-5 Units Subcutaneous QHS  . insulin aspart  0-9 Units Subcutaneous TID WC  . insulin detemir  5 Units Subcutaneous Daily  . multivitamin with minerals  1 tablet Oral Daily  . nutrition supplement (JUVEN)  1 packet Oral BID BM  . pantoprazole  40 mg Oral Daily  . propranolol ER  80 mg Oral Daily  . sodium chloride flush  10-40 mL Intracatheter Q12H  . sodium chloride flush  10-40 mL Intracatheter Q12H  . temazepam  30 mg Oral QHS  . valproic acid  250 mg Oral BID  . zinc sulfate  220 mg Oral Daily   Continuous Infusions: . sodium chloride 10 mL/hr at 07/10/19 0626  . lactated ringers 75 mL/hr at 07/21/19 0053     LOS: 28 days   Time spent > 25 mins  Azucena Fallen, DO [facility]  If 7PM-7AM, please contact night-coverage  07/21/2019, 7:30 AM

## 2019-07-21 NOTE — Progress Notes (Addendum)
Inpatient Diabetes Program Recommendations  AACE/ADA: New Consensus Statement on Inpatient Glycemic Control (2015)  Target Ranges:  Prepandial:   less than 140 mg/dL      Peak postprandial:   less than 180 mg/dL (1-2 hours)      Critically ill patients:  140 - 180 mg/dL   Lab Results  Component Value Date   GLUCAP 95 07/21/2019   HGBA1C 14.4 (H) 04/27/2019    Spoke with pt at bedside regarding DM control at home and glucose trends while in the hospital. This Coordinator also spoke with pt in February and attempted to get pt on insulin. Pt, however, d/c'd only on metformin daily instead of recommended max doses of glipizide and metformin doses.  Pt does not even have a glucose meter at home. Discussed with pt current glucose trends and minimal insulin required at this time.   Discussed importance of controlling diet at home and monitoring glucose levels and contacting PCP if glucose trends elevated.   Spoke with pt about insulin injection daily versus pills at home. Pt requests insulin pill at home.  Pt was unengaged with flat affect during conversation. Pt interacted minimally. Did show briefly insulin pen.  Will need glucose meter at time of d/c, however pt to possibly go to SNF.  Pt high risk for readmission.  Thanks,  Christena Deem RN, MSN, BC-ADM Inpatient Diabetes Coordinator Team Pager [phone] (8a-5p)

## 2019-07-22 ENCOUNTER — Encounter (HOSPITAL_COMMUNITY)
Admission: EM | Disposition: A | Discharge status: Skilled Nursing Facility | Payer: Self-pay | Source: Home / Self Care | Attending: Internal Medicine

## 2019-07-22 ENCOUNTER — Inpatient Hospital Stay (HOSPITAL_COMMUNITY): Payer: Medicare Other | Admitting: Certified Registered Nurse Anesthetist

## 2019-07-22 ENCOUNTER — Ambulatory Visit (HOSPITAL_COMMUNITY): Admission: RE | Admit: 2019-07-22 | Payer: Self-pay | Source: Home / Self Care | Admitting: Plastic Surgery

## 2019-07-22 ENCOUNTER — Encounter (HOSPITAL_COMMUNITY): Payer: Self-pay | Admitting: Internal Medicine

## 2019-07-22 DIAGNOSIS — L0390 Cellulitis, unspecified: Secondary | ICD-10-CM

## 2019-07-22 HISTORY — PX: APPLICATION OF A-CELL OF EXTREMITY: SHX6303

## 2019-07-22 HISTORY — PX: IRRIGATION AND DEBRIDEMENT OF WOUND WITH SPLIT THICKNESS SKIN GRAFT: SHX5879

## 2019-07-22 LAB — COMPREHENSIVE METABOLIC PANEL
ALT: 10 U/L (ref 0–44)
AST: 13 U/L — ABNORMAL LOW (ref 15–41)
Albumin: 1.6 g/dL — ABNORMAL LOW (ref 3.5–5.0)
Alkaline Phosphatase: 74 U/L (ref 38–126)
Anion gap: 8 (ref 5–15)
BUN: <5 mg/dL — ABNORMAL LOW (ref 6–20)
CO2: 29 mmol/L (ref 22–32)
Calcium: 8 mg/dL — ABNORMAL LOW (ref 8.9–10.3)
Chloride: 105 mmol/L (ref 98–111)
Creatinine, Ser: 0.42 mg/dL — ABNORMAL LOW (ref 0.44–1.00)
GFR calc Af Amer: >60 mL/min (ref >60)
GFR calc non Af Amer: >60 mL/min (ref >60)
Glucose, Bld: 118 mg/dL — ABNORMAL HIGH (ref 70–99)
Potassium: 3.3 mmol/L — ABNORMAL LOW (ref 3.5–5.1)
Sodium: 142 mmol/L (ref 135–145)
Total Bilirubin: 0.5 mg/dL (ref 0.3–1.2)
Total Protein: 5.5 g/dL — ABNORMAL LOW (ref 6.5–8.1)

## 2019-07-22 LAB — GLUCOSE, CAPILLARY
Glucose-Capillary: 96 mg/dL (ref 70–99)
Glucose-Capillary: 117 mg/dL — ABNORMAL HIGH (ref 70–99)
Glucose-Capillary: 126 mg/dL — ABNORMAL HIGH (ref 70–99)
Glucose-Capillary: 130 mg/dL — ABNORMAL HIGH (ref 70–99)
Glucose-Capillary: 264 mg/dL — ABNORMAL HIGH (ref 70–99)
Glucose-Capillary: 178 mg/dL — ABNORMAL HIGH (ref 70–99)

## 2019-07-22 LAB — CBC
HCT: 29.2 % — ABNORMAL LOW (ref 36.0–46.0)
Hemoglobin: 8.5 g/dL — ABNORMAL LOW (ref 12.0–15.0)
MCH: 27.2 pg (ref 26.0–34.0)
MCHC: 29.1 g/dL — ABNORMAL LOW (ref 30.0–36.0)
MCV: 93.6 fL (ref 80.0–100.0)
Platelets: 447 10*3/uL — ABNORMAL HIGH (ref 150–400)
RBC: 3.12 MIL/uL — ABNORMAL LOW (ref 3.87–5.11)
RDW: 17.6 % — ABNORMAL HIGH (ref 11.5–15.5)
WBC: 9.4 10*3/uL (ref 4.0–10.5)
nRBC: 0 % (ref 0.0–0.2)

## 2019-07-22 SURGERY — IRRIGATION AND DEBRIDEMENT OF WOUND WITH SPLIT THICKNESS SKIN GRAFT
Anesthesia: General | Site: Thigh | Laterality: Right

## 2019-07-22 MED ORDER — CEFAZOLIN SODIUM-DEXTROSE 2-4 GM/100ML-% IV SOLN
2.0000 g | Freq: Once | INTRAVENOUS | Status: AC
  Administered 2019-07-22: 2 g via INTRAVENOUS
  Filled 2019-07-22: qty 100

## 2019-07-22 MED ORDER — FENTANYL CITRATE (PF) 100 MCG/2ML IJ SOLN
INTRAMUSCULAR | Status: AC
  Filled 2019-07-22: qty 2

## 2019-07-22 MED ORDER — OXYCODONE HCL 5 MG PO TABS: 5.0000 mg | ORAL_TABLET | Freq: Once | ORAL | Status: DC | PRN

## 2019-07-22 MED ORDER — POTASSIUM CHLORIDE 10 MEQ/100ML IV SOLN
10.0000 meq | INTRAVENOUS | Status: AC
  Administered 2019-07-22: 10 meq via INTRAVENOUS
  Filled 2019-07-22 (×2): qty 300
  Filled 2019-07-22: qty 100
  Filled 2019-07-22: qty 300

## 2019-07-22 MED ORDER — POTASSIUM CHLORIDE 10 MEQ/100ML IV SOLN
10.0000 meq | INTRAVENOUS | Status: AC
  Administered 2019-07-22 (×3): 10 meq via INTRAVENOUS
  Filled 2019-07-22 (×4): qty 100

## 2019-07-22 MED ORDER — BUPIVACAINE HCL (PF) 0.25 % IJ SOLN
INTRAMUSCULAR | Status: AC
  Filled 2019-07-22: qty 30

## 2019-07-22 MED ORDER — MIDAZOLAM HCL 2 MG/2ML IJ SOLN
INTRAMUSCULAR | Status: DC | PRN
  Administered 2019-07-22: 1 mg via INTRAVENOUS

## 2019-07-22 MED ORDER — FENTANYL CITRATE (PF) 250 MCG/5ML IJ SOLN
INTRAMUSCULAR | Status: DC | PRN
  Administered 2019-07-22 (×2): 25 ug via INTRAVENOUS
  Administered 2019-07-22: 50 ug via INTRAVENOUS
  Administered 2019-07-22 (×4): 25 ug via INTRAVENOUS

## 2019-07-22 MED ORDER — PHENYLEPHRINE HCL-NACL 10-0.9 MG/250ML-% IV SOLN
INTRAVENOUS | Status: DC | PRN
  Administered 2019-07-22: 25 ug/min via INTRAVENOUS

## 2019-07-22 MED ORDER — PROPOFOL 10 MG/ML IV BOLUS
INTRAVENOUS | Status: DC | PRN
  Administered 2019-07-22: 130 mg via INTRAVENOUS

## 2019-07-22 MED ORDER — FENTANYL CITRATE (PF) 100 MCG/2ML IJ SOLN
25.0000 ug | INTRAMUSCULAR | Status: DC | PRN
  Administered 2019-07-22 (×2): 50 ug via INTRAVENOUS

## 2019-07-22 MED ORDER — OXYCODONE HCL 5 MG/5ML PO SOLN: 5.0000 mg | Freq: Once | ORAL | Status: DC | PRN

## 2019-07-22 MED ORDER — DEXAMETHASONE SODIUM PHOSPHATE 10 MG/ML IJ SOLN
INTRAMUSCULAR | Status: DC | PRN
  Administered 2019-07-22: 5 mg via INTRAVENOUS

## 2019-07-22 MED ORDER — ALBUMIN HUMAN 5 % IV SOLN: INTRAVENOUS | Status: DC | PRN

## 2019-07-22 MED ORDER — SODIUM CHLORIDE 0.9 % IR SOLN
Status: DC | PRN
  Administered 2019-07-22: 1000 mL
  Administered 2019-07-22: 3000 mL

## 2019-07-22 MED ORDER — LIDOCAINE 2% (20 MG/ML) 5 ML SYRINGE
INTRAMUSCULAR | Status: DC | PRN
  Administered 2019-07-22: 60 mg via INTRAVENOUS

## 2019-07-22 MED ORDER — CEFAZOLIN SODIUM-DEXTROSE 2-4 GM/100ML-% IV SOLN
INTRAVENOUS | Status: AC
  Filled 2019-07-22: qty 100

## 2019-07-22 MED ORDER — ONDANSETRON HCL 4 MG/2ML IJ SOLN: 4.0000 mg | Freq: Four times a day (QID) | INTRAMUSCULAR | Status: DC | PRN

## 2019-07-22 SURGICAL SUPPLY — 47 items
BNDG CONFORM 2 STRL LF (GAUZE/BANDAGES/DRESSINGS) IMPLANT
BNDG ELASTIC 4X5.8 VLCR STR LF (GAUZE/BANDAGES/DRESSINGS) ×3 IMPLANT
BNDG GAUZE ELAST 4 BULKY (GAUZE/BANDAGES/DRESSINGS) ×3 IMPLANT
COVER SURGICAL LIGHT HANDLE (MISCELLANEOUS) ×3 IMPLANT
COVER WAND RF STERILE (DRAPES) ×3 IMPLANT
DRAIN PENROSE 0.5X18 (DRAIN) ×3 IMPLANT
DRSG ADAPTIC 3X8 NADH LF (GAUZE/BANDAGES/DRESSINGS) IMPLANT
DRSG MEPITEL 4X7.2 (GAUZE/BANDAGES/DRESSINGS) ×3 IMPLANT
DRSG MEPITEL 8X12 (GAUZE/BANDAGES/DRESSINGS) ×2 IMPLANT
GAUZE SPONGE 4X4 12PLY STRL (GAUZE/BANDAGES/DRESSINGS) ×3 IMPLANT
GLOVE BIO SURGEON STRL SZ8 (GLOVE) ×3 IMPLANT
GLOVE BIOGEL M STRL SZ7.5 (GLOVE) ×3 IMPLANT
GOWN STRL REUS W/ TWL LRG LVL3 (GOWN DISPOSABLE) ×2 IMPLANT
GOWN STRL REUS W/TWL LRG LVL3 (GOWN DISPOSABLE) ×6
GRAFT MESHED PRIMATRIX 8X12 (Graft) ×3 IMPLANT
KIT BASIN OR (CUSTOM PROCEDURE TRAY) ×3 IMPLANT
KIT TURNOVER KIT B (KITS) ×3 IMPLANT
MANIFOLD NEPTUNE II (INSTRUMENTS) ×3 IMPLANT
MEPITEL 8X12 (GAUZE/BANDAGES/DRESSINGS) ×1
MICROMATRIX 1000MG (Tissue) ×3 IMPLANT
NEEDLE HYPO 25GX1X1/2 BEV (NEEDLE) IMPLANT
NS IRRIG 1000ML POUR BTL (IV SOLUTION) ×3 IMPLANT
PACK ORTHO EXTREMITY (CUSTOM PROCEDURE TRAY) ×3 IMPLANT
PAD ABD 8X10 STRL (GAUZE/BANDAGES/DRESSINGS) ×3 IMPLANT
PAD ARMBOARD 7.5X6 YLW CONV (MISCELLANEOUS) ×3 IMPLANT
PRIMATRIX AG 10X25 (Tissue) ×3 IMPLANT
PULSAVAC PLUS IRRIG FAN TIP (DISPOSABLE) ×3
SET CYSTO W/LG BORE CLAMP LF (SET/KITS/TRAYS/PACK) IMPLANT
SOL PREP POV-IOD 4OZ 10% (MISCELLANEOUS) ×6 IMPLANT
SOLUTION PARTIC MCRMTRX 1000MG (Tissue) ×1 IMPLANT
STAPLER VISISTAT (STAPLE) ×9 IMPLANT
STAPLER VISISTAT 35W (STAPLE) IMPLANT
SUT ETHILON 2 0 FS 18 (SUTURE) ×6 IMPLANT
SUT PDS AB 3-0 SH 27 (SUTURE) ×3 IMPLANT
SUT VIC AB 2-0 CT1 27 (SUTURE) ×6
SUT VIC AB 2-0 CT1 TAPERPNT 27 (SUTURE) ×2 IMPLANT
SUT VIC AB 4-0 PS2 18 (SUTURE) IMPLANT
SWAB CULTURE ESWAB REG 1ML (MISCELLANEOUS) IMPLANT
SYR CONTROL 10ML LL (SYRINGE) IMPLANT
TAPE CLOTH SURG 6X10 WHT LF (GAUZE/BANDAGES/DRESSINGS) ×3 IMPLANT
TIP FAN IRRIG PULSAVAC PLUS (DISPOSABLE) ×1 IMPLANT
TOWEL GREEN STERILE (TOWEL DISPOSABLE) ×3 IMPLANT
TOWEL GREEN STERILE FF (TOWEL DISPOSABLE) ×3 IMPLANT
TUBE CONNECTING 12'X1/4 (SUCTIONS) ×1
TUBE CONNECTING 12X1/4 (SUCTIONS) ×2 IMPLANT
WATER STERILE IRR 1000ML POUR (IV SOLUTION) ×3 IMPLANT
YANKAUER SUCT BULB TIP NO VENT (SUCTIONS) ×3 IMPLANT

## 2019-07-22 NOTE — Progress Notes (Signed)
PROGRESS NOTE    Kimberly Orozco  BMS:[id number] DOB: March 09, 1976 DOA: 06/23/2019 PCP: Patient, No Pcp Per   Brief Narrative:  44 year old with history of DM2, schizophrenia, bipolar disorder admitted with DKA in the setting of necrotizing fasciitis of the right groin, left inguinal crease and right medial thigh.  She underwent debridement on 4/13 and then again on 4/15.  Initially intubated on 4/12, thereafter extubated on 4/16 and transferred out of the ICU on 4/17.  Thoracic surgery team was consulted, another debridement was performed on 4/27 with estimated blood loss 450 cc.  On 4/28 patient again became febrile, hypotensive oligoanuric requiring transfer to the ICU.  Has been receiving multiple course of antibiotics.  ID was consulted, antibiotics were consolidated down to Augmentin. Due to Abd pain, repeat Ab/Pelvis was performed which showed small abscess in the left ground and concerns of acute cholecystitis. Gen surgery reconsulted, RUQ [place] confirmed Acute Cholecystitis, HIDA scan done - results unremarkable as below.  Patient having issues with wound dehiscence currently -being evaluated for skin grafting on 07/22/2019.   *Afternoon update - Contacted post-operatively by Dr Arita Miss - given inability to perform skin graft will delay surgery for an additional 2 weeks; Foley replaced intraoperatively in hopes to reduce soaking of bandages with urine. Stable for discharge and outpatient follow up for repeat surgical evaluation from their standpoint. Will contact case management for placement planning.  Assessment & Plan:   Active Problems:   DKA (diabetic ketoacidoses) (HCC)   Necrotizing cellulitis   Septic shock (HCC)   AKI (acute kidney injury) (HCC)   Acute respiratory failure (HCC)   Leukocytosis   Septic shock secondary to necrotizing fasciitis of abdominal wound, perineum and the right thigh Persistent abdominal pain - Tentative plan for split thickness skin graft to right thigh  wound later today` - Shock physiology improved - Per ID on Augmentin, last day 5/7 - Continue to follow culture data. - Vitamin C, zinc, supplement - Wound care per plastic surgery  3.5 cm subcutaneous abscess in the left inguinal region - General surgery previously following- conservative management of this area for now.   Cholelithiasis with gallbladder wall thickening Questionable acute cholecystitis - Suspicion for acute cholecystitis.RUQ [place] concerning as well. HIDA scan done given atypical symptoms - results remarkable for patent ducts, will continue to follow clinically, no indication for surgery at this time  Urine leakage; improved.  -CT A/P = no fistula seen - Foley removed previously given concern for overflow/obstruction   Oliguria Anasarca/hypoalbuminemia, ongoing -Catheter pulled 07/17/19 - issues with obstruction/inability to flush Foley at that time -Encourage increasing diet, follow I's and O's - difficulty recording given patient's complaints  Right common femoral vein filling defect -Suspect secondary to extensive surgery.  Unable to obtain ultrasound of this region given the extent of superficial problems given above.  Acute blood loss anemia, stable -Requiring frequent transfusions 3 U so far (4/18; 4/25; 4/28).  Maintain hemoglobin greater than 7.  Ambulatory dysfunction  -Likely secondary to above, acute infection, weight gain, edema and prolonged hospitalization -PT evaluating initially recommending CIR placement however patient does not meet CIR criteria, pending SNF placement at this time  Sinus tachycardia; resolved  -resolved with resumption of Propranolol  Diabetes mellitus type 2, fairly well controlled now -Sliding scale -Levemir 5 units daily  History of schizophrenia with bipolar disorder -Continue Haldol, valproate and Cogentin  Obesity with BMI greater than 35 -Weight loss diet and exercise  DVT prophylaxis: SCDs Code Status: Full  code Family  Communication:    Status is: Inpatient  Remains inpatient appropriate because:Hemodynamically unstable.  Still getting wound care dressing, oral antibiotics and management for generalized abdominal pain Dispo: The patient is from: Home              Anticipated d/c is to: SNF              Anticipated d/c date is: > 3 days              Patient currently is not medically stable to d/c.  Plastic surgery pending repeat surgery 07/22/19  Subjective: No acute issues or events overnight, declines fevers, chills, nausea, vomiting, diarrhea, constipation, headache. Somewhat anxious for procedure tomorrow.  Examination: Constitutional: Not in acute distress Respiratory: Clear to auscultation bilaterally Cardiovascular: Normal sinus rhythm, no rubs Abdomen: Nontender nondistended good bowel sounds Musculoskeletal: No edema noted Skin: b/l groin dressing per wound notes. Clean dry intact  Neurologic: CN 2-12 grossly intact.  And nonfocal Psychiatric: Normal judgment and insight. Alert and oriented x 3. Normal mood.    PICC line 07/09/2019--> ongoing Foley catheter 06/24/2019-->07/17/19  Objective: Vitals:   07/21/19 1421 07/21/19 2030 07/22/19 0500 07/22/19 0548  BP: (!) 107/58 107/73  96/64  Pulse: 86 84  62  Resp: 17   18  Temp: 97.8 F (36.6 C) 98.3 F (36.8 C)  98.8 F (37.1 C)  TempSrc: Axillary Oral  Oral  SpO2: 100% 99%  99%  Weight:   111.9 kg   Height:        Intake/Output Summary (Last 24 hours) at 07/22/2019 0732 Last data filed at 07/22/2019 0424 Gross per 24 hour  Intake 1168.39 ml  Output --  Net 1168.39 ml   Filed Weights   07/19/19 0500 07/20/19 0420 07/22/19 0500  Weight: 110.4 kg 109 kg 111.9 kg   Data Reviewed:   CBC: Recent Labs  Lab 07/16/19 0437 07/19/19 0834 07/22/19 0417  WBC 10.5 8.9 9.4  HGB 8.2* 8.7* 8.5*  HCT 27.3* 29.3* 29.2*  MCV 93.5 93.0 93.6  PLT 443* 454* 947*   Basic Metabolic Panel: Recent Labs  Lab 07/16/19 0437  07/19/19 0834 07/22/19 0417  NA 137 141 142  K 3.9 3.7 3.3*  CL 102 103 105  CO2 28 28 29   GLUCOSE 94 118* 118*  BUN 6 <5* <5*  CREATININE 0.47 0.42* 0.42*  CALCIUM 8.0* 8.2* 8.0*  MG 1.7  --   --    GFR: Estimated Creatinine Clearance: 113.8 mL/min (A) (by C-G formula based on SCr of 0.42 mg/dL (L)). Liver Function Tests: Recent Labs  Lab 07/16/19 0437 07/19/19 0834 07/22/19 0417  AST 11* 15 13*  ALT 7 10 10   ALKPHOS 84 78 74  BILITOT 0.2* 0.4 0.5  PROT 5.3* 5.9* 5.5*  ALBUMIN 1.4* 1.6* 1.6*   No results for input(s): LIPASE, AMYLASE in the last 168 hours. No results for input(s): AMMONIA in the last 168 hours. Coagulation Profile: No results for input(s): INR, PROTIME in the last 168 hours. Cardiac Enzymes: No results for input(s): CKTOTAL, CKMB, CKMBINDEX, TROPONINI in the last 168 hours. BNP (last 3 results) No results for input(s): PROBNP in the last 8760 hours. HbA1C: No results for input(s): HGBA1C in the last 72 hours. CBG: Recent Labs  Lab 07/20/19 2134 07/21/19 0743 07/21/19 1215 07/21/19 1647 07/21/19 1956  GLUCAP 105* 95 89 107* 109*   Lipid Profile: No results for input(s): CHOL, HDL, LDLCALC, TRIG, CHOLHDL, LDLDIRECT in the last 72 hours. Thyroid  Function Tests: No results for input(s): TSH, T4TOTAL, FREET4, T3FREE, THYROIDAB in the last 72 hours. Anemia Panel: No results for input(s): VITAMINB12, FOLATE, FERRITIN, TIBC, IRON, RETICCTPCT in the last 72 hours. Sepsis Labs: No results for input(s): PROCALCITON, LATICACIDVEN in the last 168 hours.  No results found for this or any previous visit (from the past 240 hour(s)).   Radiology Studies: No results found.   Scheduled Meds: . sodium chloride   Intravenous Once  . acetaminophen  1,000 mg Oral Q6H  . alteplase  2 mg Intracatheter Once  . alteplase  2 mg Intracatheter Once  . vitamin C  500 mg Oral BID  . benztropine  1 mg Oral BID  . Chlorhexidine Gluconate Cloth  6 each Topical  Daily  . Gerhardt's butt cream   Topical BID  . haloperidol  10 mg Oral q morning - 10a  . haloperidol  20 mg Oral QHS  . insulin aspart  0-5 Units Subcutaneous QHS  . insulin aspart  0-9 Units Subcutaneous TID WC  . insulin detemir  5 Units Subcutaneous Daily  . multivitamin with minerals  1 tablet Oral Daily  . nutrition supplement (JUVEN)  1 packet Oral BID BM  . pantoprazole  40 mg Oral Daily  . propranolol ER  80 mg Oral Daily  . sodium chloride flush  10-40 mL Intracatheter Q12H  . sodium chloride flush  10-40 mL Intracatheter Q12H  . temazepam  30 mg Oral QHS  . valproic acid  250 mg Oral BID  . zinc sulfate  220 mg Oral Daily   Continuous Infusions: . sodium chloride 10 mL/hr at 07/10/19 0626  . lactated ringers 1,000 mL (07/22/19 0101)     LOS: 29 days   Time spent > 25 mins  Azucena Fallen, DO [facility]  If 7PM-7AM, please contact night-coverage  07/22/2019, 7:32 AM

## 2019-07-22 NOTE — Op Note (Signed)
Operative Note   DATE OF OPERATION: 07/22/2019  SURGICAL DEPARTMENT: Plastic Surgery   PREOPERATIVE DIAGNOSES: Right thigh and abdominal wounds from necrotizing fasciitis  POSTOPERATIVE DIAGNOSES:  same  PROCEDURE: 1.  Debridement of right thigh and abdominal wounds totaling 33 x 23 cm 2.  Application of micro matrix powder to right abdominal wound 3.  Application of meshed prime matrix AG to the right thigh wound totaling 23 x 23 cm 4.  Complex closure of right abdominal wound totaling 10 cm in length  SURGEON: Ancil Linsey, MD  ASSISTANT: Joni Fears, PA The advanced practice practitioner (APP) assisted throughout the case.  The APP was essential in retraction and counter traction when needed to make the case progress smoothly.  This retraction and assistance made it possible to see the tissue plans for the procedure.  The assistance was needed for blood control, tissue re-approximation and assisted with closure of the incision site.  ANESTHESIA:  General.   COMPLICATIONS: None.   INDICATIONS FOR PROCEDURE:  The patient, Kimberly Orozco is a 44 y.o. female born on 21-Oct-1975, is here for treatment of complex wound from necrotizing fasciitis debridement MRN: [id number]  CONSENT:  Informed consent was obtained directly from the patient. Risks, benefits and alternatives were fully discussed. Specific risks including but not limited to bleeding, infection, hematoma, seroma, scarring, pain, contracture, asymmetry, wound healing problems, and need for further surgery were all discussed. The patient did have an ample opportunity to have questions answered to satisfaction.   DESCRIPTION OF PROCEDURE:  The patient was taken to the operating room. SCDs were placed and Ancef antibiotics were given.  General anesthesia was administered.  The patient's operative site was prepped and draped in a sterile fashion. A time out was performed and all information was confirmed to be correct.   All the staples and previous Mepitel was removed.  The wound was evaluated and appeared overall healthy.  There was still quite a bit of drainage saturating the dressings preoperatively it appeared to be coming from the undermined abdominal flaps.  Due to the excessive drainage I elected not to proceed with split-thickness skin grafting into the another round of mesh application.  The right abdominal wound had dehisced partially.  This area was debrided sharply with a 10 blade and resutured under minimal tension with buried 2-0 Vicryl sutures and interrupted 3-0 PDS mattress sutures.  This cover the length of about 10 cm.  I did replace the Penrose drain that was in the undermined lateral tissues and secured this with two 2-0 nylon sutures.  Additionally the Penrose drain that was in the left abdominal wound cavity was replaced and secured with two 2-0 nylon sutures.  The remainder of the right thigh wound was debrided with pulse lavage.  I also performed sharp debridement where necessary in an excisional fashion.  This was done so that all nonviable tissue was removed.  Hemostasis was obtained.  I was able to place micro matrix powder underneath the abdominal flap superiorly and towards the right side.  The prime matrix AG was then brought onto the field and stapled over the remainder of the right thigh wounds.  Mepitel was then placed over this and secured with staples.  The wound was dressed with surgical lube, dry Kerlix, ABDs and tape.  The patient tolerated the procedure well.  There were no complications. The patient was allowed to wake from anesthesia, extubated and taken to the recovery room in satisfactory condition.

## 2019-07-22 NOTE — Interval H&P Note (Signed)
History and Physical Interval Note:  07/22/2019 10:37 AM  Kimberly Orozco  has presented today for surgery, with the diagnosis of groin wound.  The various methods of treatment have been discussed with the patient and family. After consideration of risks, benefits and other options for treatment, the patient has consented to  Procedure(s): Debridement of right thigh wound with STSG (Right) possible placement of primatrix mesh (Right) as a surgical intervention.  The patient's history has been reviewed, patient examined, no change in status, stable for surgery.  I have reviewed the patient's chart and labs.  Questions were answered to the patient's satisfaction.     Allena Napoleon

## 2019-07-22 NOTE — Progress Notes (Signed)
PT Cancellation Note  Patient Details Name: Kimberly Orozco MRN: [id number] DOB: 1975-03-25   Cancelled Treatment:    Reason Eval/Treat Not Completed: Patient at procedure or test/unavailable ( to OR) Will follow up for PT treatment as schedule permits.  Sanjuana Letters SPT 07/22/2019   Sanjuana Letters 07/22/2019, 10:23 AM

## 2019-07-22 NOTE — Anesthesia Preprocedure Evaluation (Signed)
Anesthesia Evaluation  Patient identified by MRN, date of birth, ID band Patient awake    Reviewed: Allergy & Precautions, H&P , NPO status , Patient's Chart, lab work & pertinent test results  Airway Mallampati: II   Neck ROM: full    Dental   Pulmonary Current Smoker,    breath sounds clear to auscultation       Cardiovascular negative cardio ROS   Rhythm:regular Rate:Normal     Neuro/Psych PSYCHIATRIC DISORDERS Bipolar Disorder Schizophrenia    GI/Hepatic   Endo/Other  diabetes, Type 2  Renal/GU      Musculoskeletal   Abdominal   Peds  Hematology  (+) anemia ,   Anesthesia Other Findings   Reproductive/Obstetrics                             Anesthesia Physical Anesthesia Plan  ASA: II  Anesthesia Plan: General   Post-op Pain Management:    Induction: Intravenous  PONV Risk Score and Plan: 2 and Ondansetron, Dexamethasone, Midazolam and Treatment may vary due to age or medical condition  Airway Management Planned: LMA  Additional Equipment:   Intra-op Plan:   Post-operative Plan:   Informed Consent: I have reviewed the patients History and Physical, chart, labs and discussed the procedure including the risks, benefits and alternatives for the proposed anesthesia with the patient or authorized representative who has indicated his/her understanding and acceptance.       Plan Discussed with: CRNA, Anesthesiologist and Surgeon  Anesthesia Plan Comments:         Anesthesia Quick Evaluation

## 2019-07-22 NOTE — Anesthesia Procedure Notes (Signed)
Procedure Name: LMA Insertion Date/Time: 07/22/2019 10:52 AM Performed by: Dairl Ponder, CRNA Pre-anesthesia Checklist: Patient identified, Emergency Drugs available, Suction available, Patient being monitored and Timeout performed Patient Re-evaluated:Patient Re-evaluated prior to induction Oxygen Delivery Method: Circle system utilized Preoxygenation: Pre-oxygenation with 100% oxygen Induction Type: IV induction LMA: LMA inserted LMA Size: 4.0 Number of attempts: 1 Placement Confirmation: CO2 detector and breath sounds checked- equal and bilateral Tube secured with: Tape Dental Injury: Teeth and Oropharynx as per pre-operative assessment

## 2019-07-22 NOTE — Progress Notes (Signed)
Patient is resting comfortably in PACU.Patient underwent debridement of right thigh wound with placement of primatrix today with Dr. Arita Miss.   Drains placed in both right and left lateral abdominal incisions.    Continue with PRN dressing changes (a least once daily):  Right thigh wound: thin layer of surgical lube on mepitel mesh, cover with kerlix or other absorbable gauze, ABDs and secure with tape or other means.  Change dressing as frequently as needed. Mepitel mesh should be moist, but not soaking wet.   Right and left abdominal incisions: Cover incisions with Xeroform and ABD. Drains in place on later ends on right and left.

## 2019-07-22 NOTE — Transfer of Care (Signed)
Immediate Anesthesia Transfer of Care Note  Patient: Kimberly Orozco  Procedure(s) Performed: Debridement of right thigh wound (Right Thigh) placement of primatrix mesh (Right Thigh)  Patient Location: PACU  Anesthesia Type:General  Level of Consciousness: awake and patient cooperative  Airway & Oxygen Therapy: Patient Spontanous Breathing and Patient connected to face mask oxygen  Post-op Assessment: Report given to RN and Post -op Vital signs reviewed and stable  Post vital signs: Reviewed and stable  Last Vitals:  Vitals Value Taken Time  BP 153/102 07/22/19 1236  Temp    Pulse 92 07/22/19 1237  Resp 12 07/22/19 1237  SpO2 100 % 07/22/19 1237  Vitals shown include unvalidated device data.  Last Pain:  Vitals:   07/22/19 0941  TempSrc: Oral  PainSc:       Patients Stated Pain Goal: 0 (07/20/19 2100)  Complications: No apparent anesthesia complications

## 2019-07-22 NOTE — Anesthesia Postprocedure Evaluation (Signed)
Anesthesia Post Note  Patient: Kimberly Orozco  Procedure(s) Performed: Debridement of right thigh wound (Right Thigh) placement of primatrix mesh (Right Thigh)     Patient location during evaluation: PACU Anesthesia Type: General Level of consciousness: awake and alert Pain management: pain level controlled Vital Signs Assessment: post-procedure vital signs reviewed and stable Respiratory status: spontaneous breathing, nonlabored ventilation, respiratory function stable and patient connected to nasal cannula oxygen Cardiovascular status: blood pressure returned to baseline and stable Postop Assessment: no apparent nausea or vomiting Anesthetic complications: no    Last Vitals:  Vitals:   07/22/19 1340 07/22/19 1404  BP:  (!) 157/89  Pulse: 86 86  Resp: 17 18  Temp:  36.6 C  SpO2: 98% 100%    Last Pain:  Vitals:   07/22/19 1423  TempSrc:   PainSc: 8                  Aden Sek S

## 2019-07-22 NOTE — Brief Op Note (Signed)
07/22/2019  12:34 PM  PATIENT:  Kimberly Orozco  44 y.o. female  PRE-OPERATIVE DIAGNOSIS:  groin wound  POST-OPERATIVE DIAGNOSIS:  groin wound  PROCEDURE:  Procedure(s): Debridement of right thigh wound (Right) placement of primatrix mesh (Right)  SURGEON:  Surgeon(s) and Role:    * Glynn Freas, Wendy Poet, MD - Primary  PHYSICIAN ASSISTANT: Joni Fears, PA  ASSISTANTS: none   ANESTHESIA:   general  EBL:  15 mL   BLOOD ADMINISTERED:none  DRAINS: Penrose drain in the r and l abdomen   LOCAL MEDICATIONS USED:  NONE  SPECIMEN:  No Specimen  DISPOSITION OF SPECIMEN:  N/A  COUNTS:  YES  TOURNIQUET:  * No tourniquets in log *  DICTATION: .Dragon Dictation  PLAN OF CARE: Admit to inpatient   PATIENT DISPOSITION:  PACU - hemodynamically stable.   Delay start of Pharmacological VTE agent (>24hrs) due to surgical blood loss or risk of bleeding: not applicable

## 2019-07-23 LAB — GLUCOSE, CAPILLARY
Glucose-Capillary: 110 mg/dL — ABNORMAL HIGH (ref 70–99)
Glucose-Capillary: 180 mg/dL — ABNORMAL HIGH (ref 70–99)
Glucose-Capillary: 100 mg/dL — ABNORMAL HIGH (ref 70–99)
Glucose-Capillary: 90 mg/dL (ref 70–99)

## 2019-07-23 LAB — SARS CORONAVIRUS 2 (TAT 6-24 HRS): SARS Coronavirus 2: NEGATIVE

## 2019-07-23 NOTE — Progress Notes (Signed)
Patient insisted to remove foley, MD made ware. Explained to the patient the importance of foley with wound healing but patient insisted to pull foley.

## 2019-07-23 NOTE — Care Management (Signed)
Called patient's mother Steward Drone [phone] to give her [facility] bed offers. Steward Drone is on her way to the hospital now , to review Medicare.gov list.  Steward Drone aware plan for discharge tomorrow. Steward Drone has NCM direct number to call with SNF decision.   Ronny Flurry RN

## 2019-07-23 NOTE — Plan of Care (Signed)
  Problem: Pain Managment: Goal: General experience of comfort will improve Outcome: Progressing   Problem: Safety: Goal: Ability to remain free from injury will improve Outcome: Progressing   

## 2019-07-23 NOTE — Progress Notes (Signed)
Nutrition Follow-up  RD working remotely.  DOCUMENTATION CODES:   Obesity unspecified  INTERVENTION:   Continue Juven BID, each packet provides 80-95 calories, 8 grams of carbohydrate, 2.5 grams of protein (collagen), 7 grams of L-arginine and 7 grams of L-glutamine; supplement contains CaHMB, Vitamins C, E, B12 and Zinc to promote wound healing.  ContinuevanillaMagic cup TID with meals, each supplement provides 290 kcal and 9 grams of protein  ContinuevanillaVital Cuisine Shake TID, each supplement provides 520 kcal and 22 grams of protein  Continue MVI, vitamin C, and zinc.  NUTRITION DIAGNOSIS:   Increased nutrient needs related to wound healing, acute illness as evidenced by estimated needs.  Ongoing  GOAL:   Patient will meet greater than or equal to 90% of their needs  Progressing   MONITOR:   PO intake, Supplement acceptance, Labs, Weight trends, Skin, I & O's  REASON FOR ASSESSMENT:   Consult, Ventilator Enteral/tube feeding initiation and management  ASSESSMENT:   44 yo female admitted with DKA, necrotizing soft tissue infection of lower abdominal wall and upper thigh extending to perineum, septic shock. PMH includes DM, bipolar disorder, schizophrenia  5/11- s/p Procedure(s): 1.  Debridement of right thigh and abdominal wounds totaling 33 x 23 cm 2.  Application of micro matrix powder to right abdominal wound 3.  Application of meshed prime matrix AG to the right thigh wound totaling 23 x 23 cm 4.  Complex closure of right abdominal wound totaling 10 cm in length  Reviewed I/O's: +573 ml x 24 hours and +6.4 L since 07/09/19  UOP: 1.7 L x 24 hours   Attempted to speak with pt via phone, however, no answer.  Per prior RD notes, pt does not like Glucerna and Boost Breeze supplements, and they have been d/c. Pt has accepted all offered doses of Juven supplements since last RD visit. She is also receiving Magic Cups and Vital Cuisine shakes with her  meal trays. Intake has improved since last visit; noted meal completion 75-100%  Medications reviewed and include vitamin C and zinc.   Lab Results  Component Value Date   HGBA1C 14.4 (H) 04/27/2019   PTA DM medications are .   Labs reviewed: CBGS: 110-264 (inpatient orders for glycemic control are 0-5 units insulin aspart q HS, 0-9 units insulin aspart TID with meals, and 5 units insulin detemir daily).   Diet Order:   Diet Order            Diet heart healthy/carb modified Room service appropriate? Yes with Assist; Fluid consistency: Thin  Diet effective now              EDUCATION NEEDS:   Not appropriate for education at this time  Skin:  Skin Assessment: Skin Integrity Issues: Skin Integrity Issues:: Unstageable, Other (Comment) Unstageable: N/A Other: Necrotizing wound to lower abdominal wall and upper thigh extending to perineum  Last BM:  07/21/19  Height:   Ht Readings from Last 1 Encounters:  07/10/19 5\' 6"  (1.676 m)    Weight:   Wt Readings from Last 1 Encounters:  07/23/19 112.6 kg   BMI:  Body mass index is 40.07 kg/m.  Estimated Nutritional Needs:   Kcal:  2400-2700 kcals  Protein:  120-150 g  Fluid:  >/= 2 L    09/22/19, RD, LDN, CDCES Registered Dietitian II Certified Diabetes Care and Education Specialist Please refer to [facility] for RD and/or RD on-call/weekend/after hours pager

## 2019-07-23 NOTE — Progress Notes (Signed)
Physical Therapy Treatment Patient Details Name: Kimberly Orozco MRN: [id number] DOB: 29-Jul-1975 Today's Date: 07/23/2019    History of Present Illness 44 year old female admitted 06/23/19 with altered mental status and found to be in DKA with necrotizing soft tissue infection lower abdominal wall and upper RLE extending into perineum. Patient had extensive debridement by surgery 06/24/19 and developed shock requiring pressors. Repeat debridement 06/26/19, no longer requiring pressors and extubated 06/27/19. DKA resolved and patient transferred to hospitalist service 4/18, PCU 4/19. Plastic sx  5/11 R thigh, abdomen. PMH: schizophrenia, bipolar disorder, obesity and T2DM    PT Comments    Pt able to sit to stand from EOB without HOB raised during session min(A). Pt motivated to amb around room min guard with RW and was able to sit to stand min guard-min(A) with RW. Pt continued to require increased verbal cues for BUE placement with transfers and safety with mobility; pt remains moderate fall risk. Will continue to follow acutely until d/c to SNF.   Follow Up Recommendations  SNF;Supervision for mobility/OOB     Equipment Recommendations  Rolling walker with 5" wheels;3in1 (PT);Hospital bed;Other (comment)    Recommendations for Other Services       Precautions / Restrictions Precautions Precautions: Fall Precaution Comments: L PICC, abdominal wound, 2 drains (? - listed in EPIC post-op, but may be under gauze/dressing) Restrictions Weight Bearing Restrictions: No    Mobility  Bed Mobility Overal bed mobility: Needs Assistance Bed Mobility: Supine to Sit     Supine to sit: Min assist     General bed mobility comments: Required min assist for trunk elevation and verbal cues for scooting EOB  Transfers Overall transfer level: Needs assistance Equipment used: Rolling walker (2 wheeled) Transfers: Sit to/from Stand Sit to Stand: Min assist         General transfer comment: Pt  required min(A) for power up with sit to stand from EOB. Sit to stand x3.  Ambulation/Gait Ambulation/Gait assistance: Min guard Gait Distance (Feet): 50 Feet Assistive device: Rolling walker (2 wheeled) Gait Pattern/deviations: Step-through pattern;Decreased stride length;Wide base of support Gait velocity: decreased   General Gait Details: Pt amb bed to chair, chair to door and back, chair to bedside commode, bedside commode to recliner. Took 3 rest breaks during session.   Stairs             Wheelchair Mobility    Modified Rankin (Stroke Patients Only)       Balance Overall balance assessment: Needs assistance Sitting-balance support: Feet supported Sitting balance-Leahy Scale: Good     Standing balance support: Bilateral upper extremity supported;During functional activity Standing balance-Leahy Scale: Fair Standing balance comment: Pt can stand statically without UE support briefly.                            Cognition Arousal/Alertness: Awake/alert Behavior During Therapy: Flat affect Overall Cognitive Status: No family/caregiver present to determine baseline cognitive functioning Area of Impairment: Attention;Following commands;Safety/judgement;Awareness;Problem solving                   Current Attention Level: Sustained   Following Commands: Follows multi-step commands consistently;Follows multi-step commands with increased time Safety/Judgement: Decreased awareness of safety;Decreased awareness of deficits   Problem Solving: Slow processing;Decreased initiation;Requires verbal cues General Comments: Pt alert for session and agreeable to PT. Pt required verbal cues for BUE placement with sit to stand, verbal cues for safety with waiting for assistance with  mobility      Exercises      General Comments General comments (skin integrity, edema, etc.): Pt appeared to have peeling skin on hands, wounds covered with dressings on abdomen.  Dressings appeared WNL with no visable draining or exudate.      Pertinent Vitals/Pain Pain Assessment: Faces Faces Pain Scale: No hurt    Home Living                      Prior Function            PT Goals (current goals can now be found in the care plan section) Acute Rehab PT Goals Patient Stated Goal: to do things for herself PT Goal Formulation: With patient Time For Goal Achievement: 07/29/19 Potential to Achieve Goals: Good Progress towards PT goals: Progressing toward goals    Frequency    Min 2X/week      PT Plan Current plan remains appropriate    Co-evaluation              AM-PAC PT "6 Clicks" Mobility   Outcome Measure  Help needed turning from your back to your side while in a flat bed without using bedrails?: None Help needed moving from lying on your back to sitting on the side of a flat bed without using bedrails?: A Little Help needed moving to and from a bed to a chair (including a wheelchair)?: A Little Help needed standing up from a chair using your arms (e.g., wheelchair or bedside chair)?: A Little Help needed to walk in hospital room?: A Little Help needed climbing 3-5 steps with a railing? : A Lot 6 Click Score: 18    End of Session Equipment Utilized During Treatment: Gait belt Activity Tolerance: Patient tolerated treatment well Patient left: in chair;with chair alarm set;with call bell/phone within reach Nurse Communication: Mobility status PT Visit Diagnosis: Other abnormalities of gait and mobility (R26.89);Muscle weakness (generalized) (M62.81);Pain     Time: 8466-5993 PT Time Calculation (min) (ACUTE ONLY): 20 min  Charges:  $Therapeutic Activity: 8-22 mins                     Sanjuana Letters SPT 07/23/2019    Sanjuana Letters 07/23/2019, 9:28 AM

## 2019-07-23 NOTE — TOC Progression Note (Addendum)
Transition of Care [facility]) - Progression Note    Patient Details  Name: Kimberly Orozco MRN: [id number] Date of Birth: [birth year]  [facility] CM/SW Contact  Doy Hutching, [place] Phone Number: 07/23/2019, 10:23 AM  Clinical Narrative:   1:30pm- CSW spoke with pt via telephone as pt expressed to RN that she would like to go home. CSW introduced self, role, reason for call. Pt states that she is an adult and her mother cannot make decisions for her. CSW explained that I understood that but that pt mother is saying that she cannot assist in pt care and does not feel that she is safe to come home. We talked about that in simple terms if she doesn't have good wound care she could get sicker or come back tot the hospital. Pt perseverating on going home to take care of herself and that she doesn't want to live in the hosptial. Again explained SNF and why it is recommended and that her mother is reviewing offers to send her to a place that would short term take care of her wounds and get her stronger to go home. Pt again insistent on going home and speaking with the [facility] team, TOC RNCM Herbert Seta will also attempt to discuss this with pt.    10:23am- CSW attempted to call pt mother Steward Drone twice at [phone], on second call pt mother answered. CSW introduced self, role, reason for call. Explained I was aware she was not interested in sending pt to the facility she had initially chosen. Pt mother confirms. We discussed that pt has other options but that we needed a decision made promptly as pt is ready for discharge. Offered to verbally give facility choices, text them or email the CMS ratings. Pt mother inquired if I could physically mail them- CSW explained this is not reasonable given the need for decision in 24 hrs. Pt mother requested CSW leave the papers at the front desk for her to get when she visited pt. Again explained that we will need for pt and pt mother to make a choice promptly when pt  is ready. Pt mother states understanding. All offers left at front desk with pt room number and pt mothers name on them. RN Secretary aware.   Dr. Natale Milch was contacted and these delays in decision were discussed with him. He will also reach out to pt mother to discuss medical readiness.   Expected Discharge Plan: Skilled Nursing Facility Barriers to Discharge: Continued Medical Work up  Expected Discharge Plan and Services Expected Discharge Plan: Skilled Nursing Facility Living arrangements for the past 2 months: Single Family Home  Readmission Risk Interventions Readmission Risk Prevention Plan 07/18/2019  Transportation Screening Complete  PCP or Specialist Appt within 5-7 Days Not Complete  Not Complete comments plan for SNF  Home Care Screening Complete  Medication Review (RN CM) Referral to Pharmacy  Some recent data might be hidden

## 2019-07-23 NOTE — Progress Notes (Signed)
PROGRESS NOTE    Kimberly Orozco  WNU:[id number] DOB: 01/24/1976 DOA: 06/23/2019 PCP: Patient, No Pcp Per   Brief Narrative:  44 year old with history of DM2, schizophrenia, bipolar disorder admitted with DKA in the setting of necrotizing fasciitis of the right groin, left inguinal crease and right medial thigh.  She underwent debridement on 4/13 and then again on 4/15.  Initially intubated on 4/12, thereafter extubated on 4/16 and transferred out of the ICU on 4/17.  Thoracic surgery team was consulted, another debridement was performed on 4/27 with estimated blood loss 450 cc.  On 4/28 patient again became febrile, hypotensive oligoanuric requiring transfer to the ICU.  Has been receiving multiple course of antibiotics.  ID was consulted, antibiotics were consolidated down to Augmentin. Due to Abd pain, repeat Ab/Pelvis was performed which showed small abscess in the left ground and concerns of acute cholecystitis. Gen surgery reconsulted, RUQ [place] confirmed Acute Cholecystitis, HIDA scan done - results unremarkable as below.  Patient having issues with wound dehiscence currently -being evaluated for skin grafting on 07/22/2019.  Assessment & Plan:   Active Problems:   DKA (diabetic ketoacidoses) (HCC)   Necrotizing cellulitis   Septic shock ([place])   AKI (acute kidney injury) ([place])   Acute respiratory failure (HCC)   Leukocytosis   Septic shock secondary to necrotizing fasciitis of abdominal wound, perineum and the right thigh, resolving Persistent abdominal pain -Unfortunately unable to proceed with split-thickness skin grafting on the 11th as planned, plastic surgery indicates patient can continue daily dressing changes with close follow-up for repeat surgery in 1 to 2 weeks pending repeat evaluation at their place - Shock physiology improved - Per ID on Augmentin, last day 5/7 - Continue to follow culture data. - Vitamin C, zinc, supplement - Wound care per plastic surgery  3.5 cm  subcutaneous abscess in the left inguinal region - General surgery previously following- conservative management of this area for now.   Cholelithiasis with gallbladder wall thickening Questionable acute cholecystitis - Suspicion for acute cholecystitis.RUQ [place] concerning as well. HIDA scan done given atypical symptoms - results remarkable for patent ducts, will continue to follow clinically, no indication for surgery at this time  Urine leakage; improved.  -CT A/P = no fistula seen - Foley previously removed due to obstructive issues, replaced intraoperatively on the 11th, today on the 12th patient adamantly refusing ongoing Foley catheter and demanded to be removed stating "remove it before I remove it myself"  Oliguria Anasarca/hypoalbuminemia, ongoing -Encourage increasing diet, follow I's and O's - difficulty recording given patient's compliance  Right common femoral vein filling defect -Suspect secondary to extensive surgery.  Unable to obtain ultrasound of this region given the extent of superficial problems given above.  Acute blood loss anemia, stable -Requiring frequent transfusions 3 U so far (4/18; 4/25; 4/28).  Maintain hemoglobin greater than 7.  Ambulatory dysfunction  -Likely secondary to above, acute infection, weight gain, edema and prolonged hospitalization -PT evaluating initially recommending CIR placement however patient does not meet CIR criteria, pending SNF placement at this time  Sinus tachycardia; resolved  -resolved with resumption of Propranolol  Diabetes mellitus type 2, fairly well controlled now -Sliding scale -Levemir 5 units daily  History of schizophrenia with bipolar disorder -Continue Haldol, valproate and Cogentin  Obesity with BMI greater than 35 -Weight loss diet and exercise  DVT prophylaxis: SCDs Code Status: Full code Family Communication: Mother updated over the phone  Status is: Inpatient Dispo: The patient is from: Home  Anticipated d/c is to: SNF              Anticipated d/c date is: 24 to 48 hours             Barriers to discharge: Mother and daughter yet to choose a facility for discharge as patient requires SNF for ongoing physical therapy, wound care and treatment.              Patient currently medically stable for discharge, unable to complete previously planned skin grafting, at this time plastic surgery indicates patient is medically stable from their standpoint for discharge with close follow-up and ongoing wound care management until she can be reevaluated in 1 to 2 weeks time for further evaluation of skin grafting at that time.   Subjective: No acute issues or events overnight, declines fevers, chills, nausea, vomiting, diarrhea, constipation, headache. Somewhat anxious for procedure tomorrow.  Examination: Constitutional: Not in acute distress Respiratory: Clear to auscultation bilaterally Cardiovascular: Normal sinus rhythm, no rubs Abdomen: Nontender nondistended good bowel sounds Musculoskeletal: No edema noted Skin: b/l groin dressing per wound notes. Clean dry intact  Neurologic: CN 2-12 grossly intact.  And nonfocal.    PICC line 07/09/2019--> ongoing Foley catheter 06/24/2019-->07/17/19 Foley catheter replaced 07/22/2019 intraoperatively - removed 07/23/2019 per patient request  Objective: Vitals:   07/22/19 1404 07/22/19 2029 07/23/19 0423 07/23/19 0424  BP: (!) 157/89 115/67 124/62   Pulse: 86 88 86   Resp: 18 20 17    Temp: 97.9 F (36.6 C) 98.2 F (36.8 C) 98.8 F (37.1 C)   TempSrc: Axillary Oral Oral   SpO2: 100% 100% 100%   Weight:    112.6 kg  Height:        Intake/Output Summary (Last 24 hours) at 07/23/2019 0749 Last data filed at 07/23/2019 0423 Gross per 24 hour  Intake 2237.51 ml  Output 1665 ml  Net 572.51 ml   Filed Weights   07/20/19 0420 07/22/19 0500 07/23/19 0424  Weight: 109 kg 111.9 kg 112.6 kg   Data Reviewed:   CBC: Recent Labs  Lab  07/19/19 0834 07/22/19 0417  WBC 8.9 9.4  HGB 8.7* 8.5*  HCT 29.3* 29.2*  MCV 93.0 93.6  PLT 454* 447*   Basic Metabolic Panel: Recent Labs  Lab 07/19/19 0834 07/22/19 0417  NA 141 142  K 3.7 3.3*  CL 103 105  CO2 28 29  GLUCOSE 118* 118*  BUN <5* <5*  CREATININE 0.42* 0.42*  CALCIUM 8.2* 8.0*   GFR: Estimated Creatinine Clearance: 114.2 mL/min (A) (by C-G formula based on SCr of 0.42 mg/dL (L)). Liver Function Tests: Recent Labs  Lab 07/19/19 0834 07/22/19 0417  AST 15 13*  ALT 10 10  ALKPHOS 78 74  BILITOT 0.4 0.5  PROT 5.9* 5.5*  ALBUMIN 1.6* 1.6*   No results for input(s): LIPASE, AMYLASE in the last 168 hours. No results for input(s): AMMONIA in the last 168 hours. Coagulation Profile: No results for input(s): INR, PROTIME in the last 168 hours. Cardiac Enzymes: No results for input(s): CKTOTAL, CKMB, CKMBINDEX, TROPONINI in the last 168 hours. BNP (last 3 results) No results for input(s): PROBNP in the last 8760 hours. HbA1C: No results for input(s): HGBA1C in the last 72 hours. CBG: Recent Labs  Lab 07/22/19 1011 07/22/19 1252 07/22/19 1407 07/22/19 1712 07/22/19 2027  GLUCAP 117* 126* 130* 264* 178*   Lipid Profile: No results for input(s): CHOL, HDL, LDLCALC, TRIG, CHOLHDL, LDLDIRECT in the last 72 hours. Thyroid  Function Tests: No results for input(s): TSH, T4TOTAL, FREET4, T3FREE, THYROIDAB in the last 72 hours. Anemia Panel: No results for input(s): VITAMINB12, FOLATE, FERRITIN, TIBC, IRON, RETICCTPCT in the last 72 hours. Sepsis Labs: No results for input(s): PROCALCITON, LATICACIDVEN in the last 168 hours.  No results found for this or any previous visit (from the past 240 hour(s)).   Radiology Studies: No results found.   Scheduled Meds: . sodium chloride   Intravenous Once  . acetaminophen  1,000 mg Oral Q6H  . alteplase  2 mg Intracatheter Once  . alteplase  2 mg Intracatheter Once  . vitamin C  500 mg Oral BID  .  benztropine  1 mg Oral BID  . Chlorhexidine Gluconate Cloth  6 each Topical Daily  . Gerhardt's butt cream   Topical BID  . haloperidol  10 mg Oral q morning - 10a  . haloperidol  20 mg Oral QHS  . insulin aspart  0-5 Units Subcutaneous QHS  . insulin aspart  0-9 Units Subcutaneous TID WC  . insulin detemir  5 Units Subcutaneous Daily  . multivitamin with minerals  1 tablet Oral Daily  . nutrition supplement (JUVEN)  1 packet Oral BID BM  . pantoprazole  40 mg Oral Daily  . propranolol ER  80 mg Oral Daily  . sodium chloride flush  10-40 mL Intracatheter Q12H  . sodium chloride flush  10-40 mL Intracatheter Q12H  . temazepam  30 mg Oral QHS  . valproic acid  250 mg Oral BID  . zinc sulfate  220 mg Oral Daily   Continuous Infusions: . sodium chloride 10 mL/hr at 07/10/19 0626  . lactated ringers 75 mL/hr at 07/22/19 1800     LOS: 30 days   Time spent > 25 mins  Azucena Fallen, DO [facility]  If 7PM-7AM, please contact night-coverage  07/23/2019, 7:49 AM

## 2019-07-24 LAB — GLUCOSE, CAPILLARY
Glucose-Capillary: 89 mg/dL (ref 70–99)
Glucose-Capillary: 106 mg/dL — ABNORMAL HIGH (ref 70–99)

## 2019-07-24 MED ORDER — OXYCODONE HCL 5 MG PO TABS: 5.0000 mg | ORAL_TABLET | ORAL | 0 refills | Status: AC | PRN

## 2019-07-24 MED ORDER — TEMAZEPAM 30 MG PO CAPS: 30.0000 mg | ORAL_CAPSULE | Freq: Every day | ORAL | 0 refills | Status: DC

## 2019-07-24 MED ORDER — HALOPERIDOL 20 MG PO TABS: 20.0000 mg | ORAL_TABLET | Freq: Every day | ORAL | 0 refills | Status: DC

## 2019-07-24 MED ORDER — HALOPERIDOL 10 MG PO TABS: 10.0000 mg | ORAL_TABLET | Freq: Every morning | ORAL | 0 refills | Status: DC

## 2019-07-24 NOTE — NC FL2 (Signed)
[place] MEDICAID FL2 LEVEL OF CARE SCREENING TOOL     IDENTIFICATION  Patient Name: Marieliz Strang Birthdate: 1975/10/19 Sex: female Admission Date (Current Location): 06/23/2019  [facility] and [place] Number:  Producer, television/film/video and Address:  [facility], [address] [place], [place] [address]      Provider Number: [id number]  Attending Physician Name and Address:  Azucena Fallen, MD  Relative Name and Phone Number:  Steward Drone [phone]    Current Level of Care: Hospital Recommended Level of Care: Skilled Nursing Facility Prior Approval Number:    Date Approved/Denied: 07/09/19 PASRR Number: [id number] (approved through 08/20/2019)  Discharge Plan: SNF    Current Diagnoses: Patient Active Problem List   Diagnosis Date Noted  . Leukocytosis   . Septic shock (HCC)   . AKI (acute kidney injury) (HCC)   . Acute respiratory failure (HCC)   . Necrotizing cellulitis   . DKA (diabetic ketoacidoses) (HCC) 06/23/2019  . Diabetes mellitus type 2 in obese (HCC) 04/27/2019  . Bipolar disorder (HCC) 04/26/2019  . Schizophrenia (HCC) 04/25/2019    Orientation RESPIRATION BLADDER Height & Weight     Self, Time, Situation, Place  Normal Continent(Uretheral Catheter) Weight: 245 lb 2.4 oz (111.2 kg) Height:  5\' 6"  (167.6 cm)  BEHAVIORAL SYMPTOMS/MOOD NEUROLOGICAL BOWEL NUTRITION STATUS      Incontinent(type 6/Mushy consistency with ragged edges) Diet(See Discharge Summary)  AMBULATORY STATUS COMMUNICATION OF NEEDS Skin   Extensive Assist Verbally Surgical wounds, Other (Comment)(pressure injury L buttock w foam PRN; open incision R thigh to perineum w ABD/gauze daily; closed incision abdomen/R leg ABD daily; MASD generalized; cracking on feet and hands)                       Personal Care Assistance Level of Assistance  Bathing, Feeding, Dressing Bathing Assistance: Maximum assistance Feeding assistance: Independent(able to feed  self/Carb modified) Dressing Assistance: Maximum assistance     Functional Limitations Info  Sight, Hearing, Speech Sight Info: Adequate Hearing Info: Adequate Speech Info: Adequate    SPECIAL CARE FACTORS FREQUENCY  PT (By licensed PT), OT (By licensed OT)     PT Frequency: 5x weekly OT Frequency: 5x weekly            Contractures Contractures Info: Not present    Additional Factors Info  Code Status, Allergies, Psychotropic Code Status Info: FULL Allergies Info: No Known Allergies Psychotropic Info: haloperidol (HALDOL) tablet 10 mg every morning 10am; haloperidol (HALDOL) tablet 20 mg daily at bedtime; temazepam (RESTORIL) capsule 30 mg daily at bedtime         Current Medications (07/24/2019):  This is the current hospital active medication list Current Facility-Administered Medications  Medication Dose Route Frequency Provider Last Rate Last Admin  . 0.9 %  sodium chloride infusion (Manually program via Guardrails IV Fluids)   Intravenous Once Opyd, 07/26/2019, MD      . 0.9 %  sodium chloride infusion  250 mL Intravenous Continuous Lavone Neri, MD 10 mL/hr at 07/10/19 [phone] Restarted at 07/10/19 0626  . acetaminophen (TYLENOL) tablet 1,000 mg  1,000 mg Oral Q6H 07/12/19, PA-C   1,000 mg at 07/24/19 07/26/19  . alteplase (CATHFLO ACTIVASE) injection 2 mg  2 mg Intracatheter Once 0350, MD      . alteplase (CATHFLO ACTIVASE) injection 2 mg  2 mg Intracatheter Once Azucena Fallen, MD      . alum & mag hydroxide-simeth (  MAALOX/MYLANTA) 200-200-20 MG/5ML suspension 30 mL  30 mL Oral Q6H PRN Bodenheimer, Charles A, NP   30 mL at 07/16/19 1620  . ascorbic acid (VITAMIN C) tablet 500 mg  500 mg Oral BID Tyrone Nine, MD   500 mg at 07/23/19 2200  . benztropine (COGENTIN) tablet 1 mg  1 mg Oral BID Tyrone Nine, MD   1 mg at 07/23/19 2200  . Chlorhexidine Gluconate Cloth 2 % PADS 6 each  6 each Topical Daily Beola Cord, MD   6 each at  07/23/19 [phone]  . dextrose 50 % solution 0-50 mL  0-50 mL Intravenous PRN Beola Cord, MD      . Gerhardt's butt cream   Topical BID Beola Cord, MD   Given at 07/23/19 2205  . haloperidol (HALDOL) tablet 10 mg  10 mg Oral q morning - 10a Tyrone Nine, MD   10 mg at 07/23/19 0946  . haloperidol (HALDOL) tablet 20 mg  20 mg Oral QHS Tyrone Nine, MD   20 mg at 07/23/19 2200  . insulin aspart (novoLOG) injection 0-5 Units  0-5 Units Subcutaneous QHS Hazeline Junker B, MD      . insulin aspart (novoLOG) injection 0-9 Units  0-9 Units Subcutaneous TID WC Tyrone Nine, MD   2 Units at 07/23/19 1235  . insulin detemir (LEVEMIR) injection 5 Units  5 Units Subcutaneous Daily Elgergawy, Leana Roe, MD   5 Units at 07/23/19 0946  . loperamide (IMODIUM) capsule 4 mg  4 mg Oral Q6H PRN Amin, Ankit Chirag, MD   4 mg at 07/16/19 1800  . methocarbamol (ROBAXIN) tablet 500 mg  500 mg Oral Q6H PRN Jacinto Halim, PA-C   500 mg at 07/24/19 0542  . morphine 2 MG/ML injection 2 mg  2 mg Intravenous Q2H PRN Jacinto Halim, PA-C   2 mg at 07/08/19 2254  . multivitamin with minerals tablet 1 tablet  1 tablet Oral Daily Tyrone Nine, MD   1 tablet at 07/23/19 0946  . nutrition supplement (JUVEN) (JUVEN) powder packet 1 packet  1 packet Oral BID BM Tyrone Nine, MD   1 packet at 07/23/19 [phone]  . ondansetron (ZOFRAN) injection 4 mg  4 mg Intravenous Q8H PRN Karl Ito, MD   4 mg at 07/10/19 0029  . oxyCODONE (Oxy IR/ROXICODONE) immediate release tablet 5-10 mg  5-10 mg Oral Q4H PRN Jacinto Halim, PA-C   10 mg at 07/24/19 0542  . pantoprazole (PROTONIX) EC tablet 40 mg  40 mg Oral Daily Amin, Ankit Chirag, MD   40 mg at 07/23/19 0946  . polyethylene glycol (MIRALAX / GLYCOLAX) packet 17 g  17 g Oral Daily PRN Tyrone Nine, MD      . propranolol ER (INDERAL LA) 24 hr capsule 80 mg  80 mg Oral Daily Amin, Ankit Chirag, MD   80 mg at 07/23/19 0947  . sodium chloride flush (NS) 0.9 % injection 10-40 mL   10-40 mL Intracatheter Q12H Beola Cord, MD   10 mL at 07/21/19 2131  . sodium chloride flush (NS) 0.9 % injection 10-40 mL  10-40 mL Intracatheter PRN Tyrone Nine, MD      . sodium chloride flush (NS) 0.9 % injection 10-40 mL  10-40 mL Intracatheter Q12H Briant Sites, DO   10 mL at 07/21/19 2130  . sodium chloride flush (NS) 0.9 % injection 10-40 mL  10-40 mL Intracatheter PRN Briant Sites, DO  10 mL at 07/22/19 0424  . temazepam (RESTORIL) capsule 30 mg  30 mg Oral QHS Amin, Ankit Chirag, MD   30 mg at 07/23/19 2200  . valproic acid (DEPAKENE) 250 MG/5ML solution 250 mg  250 mg Oral BID Patrecia Pour, MD   250 mg at 07/23/19 2200  . zinc sulfate capsule 220 mg  220 mg Oral Daily Patrecia Pour, MD   220 mg at 07/23/19 3818     Discharge Medications: Please see discharge summary for a list of discharge medications.  Relevant Imaging Results:  Relevant Lab Results:   Additional Information [id number]  Alexander Mt, LCSW

## 2019-07-24 NOTE — Discharge Summary (Signed)
Physician Discharge Summary  Kimberly Orozco NLZ:[id number] DOB: [birth year] DOA: 06/23/2019  PCP: Patient, No Pcp Per  Admit date: 06/23/2019 Discharge date: 07/24/2019  Admitted From: Home Disposition: SNF  Recommendations for Outpatient Follow-up:  1. Follow up with PCP in 1-2 weeks, plastic surgery in 1 week as discussed 2. Please obtain BMP/CBC in one week  Discharge Condition: Stable CODE STATUS: Full Diet recommendation: Diabetic diet as tolerated  Brief/Interim Summary: 44 year old with history of DM2, schizophrenia, bipolar disorder admitted with DKA in the setting of necrotizing fasciitis of the right groin, left inguinal crease and right medial thigh.  She underwent debridement on 4/13 and then again on 4/15.  Initially intubated on 4/12, thereafter extubated on 4/16 and transferred out of the ICU on 4/17.  Thoracic surgery team was consulted, another debridement was performed on 4/27 with estimated blood loss 450 cc.  On 4/28 patient again became febrile, hypotensive oligoanuric requiring transfer to the ICU.  Has been receiving multiple course of antibiotics.  ID was consulted, antibiotics were consolidated down to Augmentin. Due to Abd pain, repeat Ab/Pelvis was performed which showed small abscess in the left ground and concerns of acute cholecystitis. Gen surgery reconsulted, RUQ [place] confirmed Acute Cholecystitis, HIDA scan done - results unremarkable as below.  Patient having issues with wound dehiscence currently -being evaluated for skin grafting on 07/22/2019.  Unfortunately due to ongoing wound healing patient is not yet ready for skin grafting per plastic surgery, patient otherwise stable and agreeable for discharge to skilled nursing facility for ongoing wound care management and physical therapy as directed.  She remains unsafe discharge home given ongoing need for close monitoring, wound care, and strength training given prolonged hospital stay.  Patient otherwise stable and  agreeable for discharge, mother contacted and has assisted with arrangements for nursing facility placement, follow-up with plastic surgery in 1 week for repeat evaluation and discussion about possible skin grafting over surgical site to assist in wound healing.Marland Kitchen   Discharge Diagnoses:  Active Problems:   DKA (diabetic ketoacidoses) ([place])   Necrotizing cellulitis   Septic shock ([facility])   AKI (acute kidney injury) ([place])   Acute respiratory failure ([place])   Leukocytosis    Discharge Instructions  Discharge Instructions    Call MD for:  difficulty breathing, headache or visual disturbances   Complete by: As directed    Call MD for:  extreme fatigue   Complete by: As directed    Call MD for:  hives   Complete by: As directed    Call MD for:  persistant dizziness or light-headedness   Complete by: As directed    Call MD for:  persistant nausea and vomiting   Complete by: As directed    Call MD for:  redness, tenderness, or signs of infection (pain, swelling, redness, odor or green/yellow discharge around incision site)   Complete by: As directed    Call MD for:  severe uncontrolled pain   Complete by: As directed    Call MD for:  temperature >100.4   Complete by: As directed    Diet - low sodium heart healthy   Complete by: As directed    Discharge instructions   Complete by: As directed    Daily wound care as directed by plastic surgery, follow-up with plastic surgery next week as scheduled for repeat evaluation and possible skin grafting as discussed.   Increase activity slowly   Complete by: As directed      Allergies as of 07/24/2019  No Known Allergies     Medication List    STOP taking these medications   cloNIDine 0.2 MG tablet Commonly known as: CATAPRES   haloperidol decanoate 100 MG/ML injection Commonly known as: HALDOL DECANOATE     TAKE these medications   acetaminophen 325 MG tablet Commonly known as: TYLENOL Take 650 mg by mouth every 6 (six) hours as  needed for mild pain or headache.   benztropine 1 MG tablet Commonly known as: COGENTIN Take 1 tablet (1 mg total) by mouth 2 (two) times daily.   divalproex 125 MG capsule Commonly known as: DEPAKOTE SPRINKLE Take 2 capsules (250 mg total) by mouth 2 (two) times daily. What changed: Another medication with the same name was removed. Continue taking this medication, and follow the directions you see here.   haloperidol 10 MG tablet Commonly known as: HALDOL Take 1 tablet (10 mg total) by mouth every morning. What changed:   how much to take  how to take this  when to take this  additional instructions   haloperidol 20 MG tablet Commonly known as: HALDOL Take 1 tablet (20 mg total) by mouth at bedtime. What changed: You were already taking a medication with the same name, and this prescription was added. Make sure you understand how and when to take each.   insulin aspart 100 UNIT/ML injection Commonly known as: novoLOG Inject 4 Units into the skin 3 (three) times daily with meals.   metFORMIN 500 MG 24 hr tablet Commonly known as: GLUCOPHAGE-XR Take 1 tablet (500 mg total) by mouth daily with supper.   propranolol ER 80 MG 24 hr capsule Commonly known as: Inderal LA Take 1 capsule (80 mg total) by mouth daily.   temazepam 30 MG capsule Commonly known as: RESTORIL Take 1 capsule (30 mg total) by mouth at bedtime.      Contact information for after-discharge care    Destination    [facility] Preferred SNF .   Service: Skilled Nursing Contact information: [address] [place] [address] [phone]             No Known Allergies  Consultations: Plastic surgery, general surgery   Procedures/Studies: NM Hepatobiliary Liver Func  Result Date: 07/15/2019 CLINICAL DATA:  RIGHT upper quadrant pain.  Cholelithiasis. EXAM: NUCLEAR MEDICINE HEPATOBILIARY IMAGING TECHNIQUE: Sequential images of the abdomen were obtained out to 60  minutes following intravenous administration of radiopharmaceutical. RADIOPHARMACEUTICALS:  5.4 mCi Tc-69m  Choletec IV COMPARISON:  Ultrasound 07/14/2019 FINDINGS: Prompt clearance radiotracer from the blood pool and homogeneous uptake liver. Counts are present in the common bile duct and small bowel by 25 minutes. No filling of the gallbladder at 40 minutes. 3.0 mg of IV morphine were administered to contract the sphincter of Odi and promote gallbladder filling. Following morphine administration, the gallbladder filled. IMPRESSION: 1. Gallbladder filling indicating patent cystic duct. 2. Patent common bile duct. Electronically Signed   By: Genevive Bi M.D.   On: 07/15/2019 12:36   CT ABDOMEN PELVIS W CONTRAST  Result Date: 07/13/2019 CLINICAL DATA:  Diffuse abdominal pain EXAM: CT ABDOMEN AND PELVIS WITH CONTRAST TECHNIQUE: Multidetector CT imaging of the abdomen and pelvis was performed using the standard protocol following bolus administration of intravenous contrast. CONTRAST:  OMNIPAQUE IOHEXOL 300 MG/ML  SOLN COMPARISON:  06/23/2019 FINDINGS: Lower chest: There are small bilateral pleural effusions with adjacent atelectasis.The heart size is normal. Hepatobiliary: The liver is normal. There is diffuse gallbladder wall thickening. A gallstone is  noted. The gallbladder is mildly distended.There is no biliary ductal dilation. Pancreas: Normal contours without ductal dilatation. No peripancreatic fluid collection. Spleen: Unremarkable. Adrenals/Urinary Tract: --Adrenal glands: Unremarkable. --Right kidney/ureter: No hydronephrosis or radiopaque kidney stones. --Left kidney/ureter: No hydronephrosis or radiopaque kidney stones. --Urinary bladder: There is a Foley catheter in place. Stomach/Bowel: --Stomach/Duodenum: No hiatal hernia or other gastric abnormality. Normal duodenal course and caliber. --Small bowel: Unremarkable. --Colon: Unremarkable. --Appendix: Normal. Vascular/Lymphatic: Normal  course and caliber of the major abdominal vessels. There is a possible filling defect within the right common femoral vein (axial series 3, image 96). --No retroperitoneal lymphadenopathy. --No mesenteric lymphadenopathy. --reactive lymphadenopathy is noted within the patient's pelvis and bilateral inguinal regions. Reproductive: There is a large fibroid uterus. Other: No ascites or free air. The patient is status post prior debridement of the right inguinal region. A surgical drain is noted coursing from the patient's left inguinal region across the midline. Inferior to the surgical drain there is a 4 x 1.5 cm abscess in the subcutaneous fat overlying the low left inguinal region (axial series 3, image 98). Pockets of subcutaneous gas are noted in the patient's anterior abdominal wall and pannus which may be postsurgical or post infectious in etiology. There is skin thickening overlying the lateral right proximal thigh. Musculoskeletal. No acute displaced fractures. IMPRESSION: 1. Cholelithiasis with diffuse gallbladder wall thickening and a gallstone. Findings could represent acute cholecystitis in the appropriate clinical setting. If there is concern for acute cholecystitis, follow-up with ultrasound is recommended. 2. Possible filling defect within the right common femoral vein. Recommend correlation with lower extremity venous Doppler ultrasound to help exclude an underlying DVT. 3. Small bilateral pleural effusions with adjacent atelectasis. 4. Probable 3.5 cm abscess in the subcutaneous fat overlying the left inguinal region, inferior to the left lateral pro chin surgical drain. 5. Extensive postsurgical changes overlying the right inguinal region related to interval debridement. 6. Fibroid uterus. These results will be called to the ordering clinician or representative by the Radiologist Assistant, and communication documented in the PACS or [employer]. Electronically Signed   By: Katherine Mantle  M.D.   On: 07/13/2019 16:47   DG CHEST PORT 1 VIEW  Result Date: 07/08/2019 CLINICAL DATA:  Respiratory distress. EXAM: PORTABLE CHEST 1 VIEW COMPARISON:  June 25, 2019 FINDINGS: Decreased lung volumes are noted which is likely secondary to suboptimal patient inspiration. The endotracheal tube, nasogastric tube and right internal jugular venous catheter seen on the prior study have been removed. A left-sided PICC line is seen with its distal tip noted at the junction of the superior vena cava and right atrium. There is no evidence of acute infiltrate, pleural effusion or pneumothorax. A 9.9 cm long thin, linear radiopaque foreign body structure is seen overlying the mid to upper left lung and is not seen on the prior exam. The heart size and mediastinal contours are within normal limits. The visualized skeletal structures are unremarkable. IMPRESSION: No active disease. Electronically Signed   By: Aram Candela M.D.   On: 07/08/2019 22:35   DG CHEST PORT 1 VIEW  Result Date: 06/25/2019 CLINICAL DATA:  Central line, OG tube placement EXAM: PORTABLE CHEST 1 VIEW COMPARISON:  06/24/2019 FINDINGS: Interval placement of right neck vascular catheter, tip projecting near the superior cavoatrial junction. Interval placement of esophagogastric tube, tip and side port below the diaphragm. Endotracheal tube remains in unchanged position, tip below the thoracic inlet. No acute appearing airspace opacity. IMPRESSION: 1. Interval placement of right neck vascular  catheter, tip projecting near the superior cavoatrial junction. 2. Interval placement of esophagogastric tube, tip and side port below the diaphragm. Electronically Signed   By: Lauralyn PrimesAlex  Bibbey M.D.   On: 06/25/2019 10:50   Portable Chest x-ray  Result Date: 06/24/2019 CLINICAL DATA:  Endotracheal tube. EXAM: PORTABLE CHEST 1 VIEW COMPARISON:  June 23, 2019. FINDINGS: Stable cardiomediastinal silhouette. Endotracheal tube is in grossly good position. No  pneumothorax or pleural effusion is noted. Stable elevated right hemidiaphragm is noted. No acute pulmonary disease is noted. Bony thorax is unremarkable. IMPRESSION: Endotracheal tube in grossly good position. No acute cardiopulmonary abnormality seen. Electronically Signed   By: Lupita RaiderJames  Green Jr M.D.   On: 06/24/2019 14:55   DG Abd Portable 1V  Result Date: 06/25/2019 CLINICAL DATA:  Evaluate OG tube placement. EXAM: PORTABLE ABDOMEN - 1 VIEW COMPARISON:  CT AP 06/23/2019 FINDINGS: There is a nasogastric tube with tip projecting over the right upper quadrant of the abdomen in the expected location of the proximal duodenum. No abnormal bowel distension identified IMPRESSION: The tip of the nasogastric tube is in the expected location of the proximal duodenum. Electronically Signed   By: Signa Kellaylor  Stroud M.D.   On: 06/25/2019 10:38   VAS US LOWER EXTREMITY VENOUS (DVT)  Result Date: 07/09/2019  Lower Venous DVTStudy Indications: Pain.  Limitations: Bilateral bandaging. Comparison Study: no prior Performing Technologist: Blanch MediaMegan Riddle RVS  Examination Guidelines: A complete evaluation includes B-mode imaging, spectral Doppler, color Doppler, and power Doppler as needed of all accessible portions of each vessel. Bilateral testing is considered an integral part of a complete examination. Limited examinations for reoccurring indications may be performed as noted. The reflux portion of the exam is performed with the patient in reverse Trendelenburg.  +---------+---------------+---------+-----------+----------+--------------+ RIGHT    CompressibilityPhasicitySpontaneityPropertiesThrombus Aging +---------+---------------+---------+-----------+----------+--------------+ CFV                                                   Not visualized +---------+---------------+---------+-----------+----------+--------------+ SFJ                                                   Not visualized  +---------+---------------+---------+-----------+----------+--------------+ FV Prox                                               Not visualized +---------+---------------+---------+-----------+----------+--------------+ FV Mid                                                Not visualized +---------+---------------+---------+-----------+----------+--------------+ FV Distal                                             Not visualized +---------+---------------+---------+-----------+----------+--------------+ PFV  Not visualized +---------+---------------+---------+-----------+----------+--------------+ POP      Full           Yes      Yes                                 +---------+---------------+---------+-----------+----------+--------------+ PTV      Full                                                        +---------+---------------+---------+-----------+----------+--------------+ PERO     Full                                                        +---------+---------------+---------+-----------+----------+--------------+   +---------+---------------+---------+-----------+----------+--------------+ LEFT     CompressibilityPhasicitySpontaneityPropertiesThrombus Aging +---------+---------------+---------+-----------+----------+--------------+ CFV                                                   Not visualized +---------+---------------+---------+-----------+----------+--------------+ SFJ                                                   Not visualized +---------+---------------+---------+-----------+----------+--------------+ FV Prox                                               Not visualized +---------+---------------+---------+-----------+----------+--------------+ FV Mid                  Yes      Yes                                  +---------+---------------+---------+-----------+----------+--------------+ FV Distal               Yes      Yes                                 +---------+---------------+---------+-----------+----------+--------------+ PFV                                                   Not visualized +---------+---------------+---------+-----------+----------+--------------+ POP      Full           Yes      Yes                                 +---------+---------------+---------+-----------+----------+--------------+ PTV      Full                                                        +---------+---------------+---------+-----------+----------+--------------+  PERO     Full                                                        +---------+---------------+---------+-----------+----------+--------------+     Summary: RIGHT: - There is no evidence of deep vein thrombosis in the lower extremity. However, portions of this examination were limited- see technologist comments above.  - No cystic structure found in the popliteal fossa.  LEFT: - There is no evidence of deep vein thrombosis in the lower extremity. However, portions of this examination were limited- see technologist comments above.  - No cystic structure found in the popliteal fossa.  *See table(s) above for measurements and observations. Electronically signed by Lemar Livings MD on 07/09/2019 at 8:19:35 PM.    Final    [place] EKG Site Rite  Result Date: 07/09/2019 If Site Rite image not attached, placement could not be confirmed due to current cardiac rhythm.  [place] EKG SITE RITE  Result Date: 06/30/2019 If Site Rite image not attached, placement could not be confirmed due to current cardiac rhythm.  US Abdomen Limited RUQ  Result Date: 07/14/2019 CLINICAL DATA:  Abdominal pain EXAM: ULTRASOUND ABDOMEN LIMITED RIGHT UPPER QUADRANT COMPARISON:  07/13/2019 FINDINGS: Gallbladder: There is diffuse gallbladder wall thickening which measures  5.5 mm in maximum thickness. There is a stone within the neck of gallbladder measuring 2 cm. Gallbladder sludge is present. Positive sonographic Murphy's sign. Common bile duct: Diameter: 6.5 mm Liver: No focal lesion identified. Within normal limits in parenchymal echogenicity. Portal vein is patent on color Doppler imaging with normal direction of blood flow towards the liver. Other: None. IMPRESSION: 1. Diffuse gallbladder wall thickening with gallstone and positive sonographic Murphy's sign. Imaging findings are compatible with acute cholecystitis. 2. Small right pleural effusion. Electronically Signed   By: Signa Kell M.D.   On: 07/14/2019 11:34     Subjective: No acute issues or events overnight, denies nausea, vomiting, diarrhea, constipation, headache, fevers, chills.   Discharge Exam: Vitals:   07/23/19 2036 07/24/19 0514  BP: (!) 109/59 127/81  Pulse: 78 81  Resp:  20  Temp: 98 F (36.7 C) 99.5 F (37.5 C)  SpO2: 100% 99%   Vitals:   07/23/19 1716 07/23/19 2036 07/24/19 0500 07/24/19 0514  BP: 120/70 (!) 109/59  127/81  Pulse: 83 78  81  Resp: 18   20  Temp: 98.8 F (37.1 C) 98 F (36.7 C)  99.5 F (37.5 C)  TempSrc: Oral Oral  Oral  SpO2: 100% 100%  99%  Weight:   111.2 kg   Height:        General: Pt is alert, awake, not in acute distress Cardiovascular: RRR, S1/S2 +, no rubs, no gallops Respiratory: CTA bilaterally, no wheezing, no rhonchi Abdominal: Soft, NT, ND, bowel sounds +; postoperative bandage clean dry intact. Extremities: no edema, no cyanosis    The results of significant diagnostics from this hospitalization (including imaging, microbiology, ancillary and laboratory) are listed below for reference.     Microbiology: Recent Results (from the past 240 hour(s))  SARS CORONAVIRUS 2 (TAT 6-24 HRS) Nasopharyngeal Nasopharyngeal Swab     Status: None   Collection Time: 07/23/19 11:11 AM   Specimen: Nasopharyngeal Swab  Result Value Ref Range  Status   SARS Coronavirus 2 NEGATIVE NEGATIVE Final  Comment: (NOTE) SARS-CoV-2 target nucleic acids are NOT DETECTED. The SARS-CoV-2 RNA is generally detectable in upper and lower respiratory specimens during the acute phase of infection. Negative results do not preclude SARS-CoV-2 infection, do not rule out co-infections with other pathogens, and should not be used as the sole basis for treatment or other patient management decisions. Negative results must be combined with clinical observations, patient history, and epidemiological information. The expected result is Negative. Fact Sheet for Patients: HairSlick.no Fact Sheet for Healthcare Providers: quierodirigir.com This test is not yet approved or cleared by the [place] FDA and  has been authorized for detection and/or diagnosis of SARS-CoV-2 by FDA under an Emergency Use Authorization (EUA). This EUA will remain  in effect (meaning this test can be used) for the duration of the COVID-19 declaration under Section 56 4(b)(1) of the Act, 21 U.S.C. section 360bbb-3(b)(1), unless the authorization is terminated or revoked sooner. Performed at [facility], [address] [place], [place] [address]      Labs: BNP (last 3 results) Recent Labs    07/11/19 0500  BNP 144.8*   Basic Metabolic Panel: Recent Labs  Lab 07/19/19 0834 07/22/19 0417  NA 141 142  K 3.7 3.3*  CL 103 105  CO2 28 29  GLUCOSE 118* 118*  BUN <5* <5*  CREATININE 0.42* 0.42*  CALCIUM 8.2* 8.0*   Liver Function Tests: Recent Labs  Lab 07/19/19 0834 07/22/19 0417  AST 15 13*  ALT 10 10  ALKPHOS 78 74  BILITOT 0.4 0.5  PROT 5.9* 5.5*  ALBUMIN 1.6* 1.6*   No results for input(s): LIPASE, AMYLASE in the last 168 hours. No results for input(s): AMMONIA in the last 168 hours. CBC: Recent Labs  Lab 07/19/19 0834 07/22/19 0417  WBC 8.9 9.4  HGB 8.7* 8.5*  HCT 29.3* 29.2*   MCV 93.0 93.6  PLT 454* 447*   Cardiac Enzymes: No results for input(s): CKTOTAL, CKMB, CKMBINDEX, TROPONINI in the last 168 hours. BNP: Invalid input(s): POCBNP CBG: Recent Labs  Lab 07/23/19 0821 07/23/19 1223 07/23/19 1710 07/23/19 2106 07/24/19 0752  GLUCAP 110* 180* 100* 90 89   D-Dimer No results for input(s): DDIMER in the last 72 hours. Hgb A1c No results for input(s): HGBA1C in the last 72 hours. Lipid Profile No results for input(s): CHOL, HDL, LDLCALC, TRIG, CHOLHDL, LDLDIRECT in the last 72 hours. Thyroid function studies No results for input(s): TSH, T4TOTAL, T3FREE, THYROIDAB in the last 72 hours.  Invalid input(s): FREET3 Anemia work up No results for input(s): VITAMINB12, FOLATE, FERRITIN, TIBC, IRON, RETICCTPCT in the last 72 hours. Urinalysis    Component Value Date/Time   COLORURINE YELLOW 06/23/2019 1715   APPEARANCEUR CLOUDY (A) 06/23/2019 1715   LABSPEC 1.028 06/23/2019 1715   PHURINE 6.0 06/23/2019 1715   GLUCOSEU >=500 (A) 06/23/2019 1715   HGBUR LARGE (A) 06/23/2019 1715   BILIRUBINUR NEGATIVE 06/23/2019 1715   KETONESUR 20 (A) 06/23/2019 1715   PROTEINUR 30 (A) 06/23/2019 1715   NITRITE NEGATIVE 06/23/2019 1715   LEUKOCYTESUR LARGE (A) 06/23/2019 1715   Sepsis Labs Invalid input(s): PROCALCITONIN,  WBC,  LACTICIDVEN Microbiology Recent Results (from the past 240 hour(s))  SARS CORONAVIRUS 2 (TAT 6-24 HRS) Nasopharyngeal Nasopharyngeal Swab     Status: None   Collection Time: 07/23/19 11:11 AM   Specimen: Nasopharyngeal Swab  Result Value Ref Range Status   SARS Coronavirus 2 NEGATIVE NEGATIVE Final    Comment: (NOTE) SARS-CoV-2 target nucleic acids are NOT  DETECTED. The SARS-CoV-2 RNA is generally detectable in upper and lower respiratory specimens during the acute phase of infection. Negative results do not preclude SARS-CoV-2 infection, do not rule out co-infections with other pathogens, and should not be used as the sole basis  for treatment or other patient management decisions. Negative results must be combined with clinical observations, patient history, and epidemiological information. The expected result is Negative. Fact Sheet for Patients: HairSlick.no Fact Sheet for Healthcare Providers: quierodirigir.com This test is not yet approved or cleared by the [place] FDA and  has been authorized for detection and/or diagnosis of SARS-CoV-2 by FDA under an Emergency Use Authorization (EUA). This EUA will remain  in effect (meaning this test can be used) for the duration of the COVID-19 declaration under Section 56 4(b)(1) of the Act, 21 U.S.C. section 360bbb-3(b)(1), unless the authorization is terminated or revoked sooner. Performed at [facility], [address], [place], [place] [address]      Time coordinating discharge: Over 30 minutes  SIGNED:   Azucena Fallen, DO [facility] 07/24/2019, 11:22 AM Pager   If 7PM-7AM, please contact night-coverage [email or web address]

## 2019-07-24 NOTE — Progress Notes (Signed)
NURSING PROGRESS NOTE  Kimberly Orozco [id number] Discharge Data: 07/24/2019 4:30 PM Attending Provider: Azucena Fallen, MD [id number], No Pcp Per     [name] to be D/C'd [facility]  Skilled nursing facility per MD order.  Discussed with the patient the After Visit Summary and all questions fully answered. All IV's discontinued with no bleeding noted. All belongings returned to patient for patient to take to SNF. D/C instructions were called to patient's mother Tylene Fantasia at [phone]. Prescriptions were placed in the patient's packet and given to [facility] transporters. Report was called to [facility] receiving RN at [facility].  Last Vital Signs:  Blood pressure 127/81, pulse 81, temperature 99.5 F (37.5 C), temperature source Oral, resp. rate 20, height 5\' 6"  (1.676 m), weight 111.2 kg, SpO2 99 %.  Discharge Medication List Allergies as of 07/24/2019   No Known Allergies     Medication List    STOP taking these medications   cloNIDine 0.2 MG tablet Commonly known as: CATAPRES   haloperidol decanoate 100 MG/ML injection Commonly known as: HALDOL DECANOATE     TAKE these medications   acetaminophen 325 MG tablet Commonly known as: TYLENOL Take 650 mg by mouth every 6 (six) hours as needed for mild pain or headache.   benztropine 1 MG tablet Commonly known as: COGENTIN Take 1 tablet (1 mg total) by mouth 2 (two) times daily.   divalproex 125 MG capsule Commonly known as: DEPAKOTE SPRINKLE Take 2 capsules (250 mg total) by mouth 2 (two) times daily. What changed: Another medication with the same name was removed. Continue taking this medication, and follow the directions you see here.   haloperidol 10 MG tablet Commonly known as: HALDOL Take 1 tablet (10 mg total) by mouth every morning. What changed:   how much to take  how to take this  when to take this  additional instructions   haloperidol 20 MG tablet Commonly known as:  HALDOL Take 1 tablet (20 mg total) by mouth at bedtime. What changed: You were already taking a medication with the same name, and this prescription was added. Make sure you understand how and when to take each.   insulin aspart 100 UNIT/ML injection Commonly known as: novoLOG Inject 4 Units into the skin 3 (three) times daily with meals.   metFORMIN 500 MG 24 hr tablet Commonly known as: GLUCOPHAGE-XR Take 1 tablet (500 mg total) by mouth daily with supper.   oxyCODONE 5 MG immediate release tablet Commonly known as: Oxy IR/ROXICODONE Take 1 tablet (5 mg total) by mouth every 4 (four) hours as needed for up to 3 days for moderate pain or severe pain (5mg  for moderate pain, 10mg  for severe pain).   propranolol ER 80 MG 24 hr capsule Commonly known as: Inderal LA Take 1 capsule (80 mg total) by mouth daily.   temazepam 30 MG capsule Commonly known as: RESTORIL Take 1 capsule (30 mg total) by mouth at bedtime.

## 2019-07-24 NOTE — Social Work (Signed)
Clinical Social Worker facilitated patient discharge including contacting patient family and facility to confirm patient discharge plans.  Clinical information faxed to facility and family agreeable with plan.  CSW arranged ambulance transport via [facility] to [facility] RN to call [phone] with report prior to discharge.  Clinical Social Worker will sign off for now as social work intervention is no longer needed. Please consult [place] again if new need arises.  Octavio Graves, MSW, LCSW Clinical Social Worker

## 2019-07-24 NOTE — TOC Transition Note (Signed)
Transition of Care [facility]) - CM/SW Discharge Note   Patient Details  Name: Kimberly Orozco MRN: [id number] Date of Birth: 09/03/1975  [facility] CM/SW Contact:  Doy Hutching, LCSW Phone Number: 07/24/2019, 10:36 AM   Clinical Narrative:    Bed available for placement at [facility] per pt mother choice. CSW has messaged MD to assess for stability for d/c today. [facility] papers completed, await confirmation of d/c today to arrange discharge.    Final next level of care: Skilled Nursing Facility Barriers to Discharge: Barriers Resolved   Patient Goals and CMS Choice Patient states their goals for this hospitalization and ongoing recovery are:: to go to skilled nursing facility CMS Medicare.gov Compare Post Acute Care list provided to:: Patient Represenative (must comment)(pt mother Steward Drone) Choice offered to / list presented to : Parent, Patient  Discharge Placement PASRR number recieved: 07/21/19            Patient chooses bed at: [facility] Patient to be transferred to facility by: [facility] Name of family member notified: pt mother via telephone Patient and family notified of of transfer: 07/24/19  Readmission Risk Interventions Readmission Risk Prevention Plan 07/18/2019  Transportation Screening Complete  PCP or Specialist Appt within 5-7 Days Not Complete  Not Complete comments plan for SNF  Home Care Screening Complete  Medication Review (RN CM) Referral to Pharmacy  Some recent data might be hidden

## 2019-07-24 NOTE — Care Management (Signed)
Received a call from patient's mother Steward Drone [phone]. Her Snf choices in order are [facility], [facility] and [place].  Ronny Flurry RN

## 2019-07-24 NOTE — Care Management (Signed)
Patient's mother Steward Drone aware patient has been accepted to [facility] and will be transported there today.   Ronny Flurry RN

## 2019-08-03 NOTE — Progress Notes (Signed)
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  She underwent multiple debridements.  The right and left abdominal wounds were repaired via complex closure.  She had meshed prime matrix AG applied to the right thigh wound on 07/08/2019 and again on 07/22/2019.  Staples were applied to secure the prime matrix AG.   Today she presents for staple removal. Patient is currently residing in a SNF [facility] in [place]).  Mesh and left drain are no longer in place. Right drain is in place. Left incision is mostly intact, with some visible skin breakdown. Right incision has completely dehisced. Right thigh wound has good granulation tissue formation.  No signs of infection, seroma/hematoma. Significant drainage present.  Staples removed.  ROS limited due to limited patient communication. Reports her stomach hurts, her back hurts, and her wounds hurt. Patient reports she had some juice and a sausage patty for breakfast.  Reports some dizziness after lying back and having staples removed.  Patient was given some water and she reports dizziness resolved.         Do daily dressing changes and PRN to keep wounds dry. Dressing changes consist of Xeroform, gauze/abd, and secure with tape or other means. Change as often as needed to keep from being soaking wet.   Recommend diet of good veggies and protein. Watch sugar intake/ keep diabetes well controlled to help promote good wound healing. Make sure patient has adequate water intake.  Current plan is for possible skin graft in July to right thigh wound. Follow -up in office 1-2 weeks prior to surgery. Call office with any questions/concerns.   Pictures were obtained of the patient and placed in the chart with the patient's or guardian's permission.  The 21st Century Cures Act was signed into law in 2016 which includes the topic of electronic health records.  This provides immediate  access to information in MyChart.  This includes consultation notes, operative notes, office notes, lab results and pathology reports.  If you have any questions about what you read please let us know at your next visit or call us at the office.  We are right here with you.

## 2019-08-04 ENCOUNTER — Other Ambulatory Visit: Payer: Self-pay

## 2019-08-04 ENCOUNTER — Ambulatory Visit (INDEPENDENT_AMBULATORY_CARE_PROVIDER_SITE_OTHER): Payer: Self-pay | Admitting: Plastic Surgery

## 2019-08-04 ENCOUNTER — Encounter: Payer: Self-pay | Admitting: Plastic Surgery

## 2019-08-04 ENCOUNTER — Telehealth: Payer: Self-pay | Admitting: Plastic Surgery

## 2019-08-04 DIAGNOSIS — S71101D Unspecified open wound, right thigh, subsequent encounter: Secondary | ICD-10-CM

## 2019-08-04 DIAGNOSIS — S31109D Unspecified open wound of abdominal wall, unspecified quadrant without penetration into peritoneal cavity, subsequent encounter: Secondary | ICD-10-CM

## 2019-08-04 DIAGNOSIS — S31109A Unspecified open wound of abdominal wall, unspecified quadrant without penetration into peritoneal cavity, initial encounter: Secondary | ICD-10-CM | POA: Insufficient documentation

## 2019-08-04 NOTE — Telephone Encounter (Signed)
Patient's mom called to find out how long her daughter has to be in the facility, when she can shower and if she should have a follow up appt. Please call mom Steward Drone) [phone]. Per hospital, mom is healthcare proxy.

## 2019-08-04 NOTE — Telephone Encounter (Signed)
Returned mothers call. Advised patient she is able to take a shower, but no scrubbing. The facility that she is at will call to set up follow up appointment 1-2 weeks from today. We do not know long she will be at that facility. Suggested to call her social worker, Barrington Ellison from [place] to see if she would know.

## 2019-08-05 ENCOUNTER — Ambulatory Visit (HOSPITAL_COMMUNITY): Admission: RE | Admit: 2019-08-05 | Payer: Medicare Other | Source: Home / Self Care | Admitting: Plastic Surgery

## 2019-08-05 ENCOUNTER — Encounter (HOSPITAL_COMMUNITY): Admission: RE | Payer: Self-pay | Source: Home / Self Care

## 2019-08-05 SURGERY — IRRIGATION AND DEBRIDEMENT OF WOUND WITH SPLIT THICKNESS SKIN GRAFT
Anesthesia: General | Site: Thigh | Laterality: Right

## 2019-08-13 ENCOUNTER — Ambulatory Visit (HOSPITAL_COMMUNITY): Payer: Self-pay | Admitting: Psychiatry

## 2019-08-14 ENCOUNTER — Encounter: Payer: Self-pay | Admitting: Plastic Surgery

## 2019-08-18 ENCOUNTER — Telehealth: Payer: Self-pay | Admitting: Plastic Surgery

## 2019-08-18 ENCOUNTER — Telehealth: Payer: Self-pay

## 2019-08-18 NOTE — Telephone Encounter (Signed)
Spoke with Steward Drone, patient's mother. The wound is open as after debridement there was not enough tissue to close the leg wound and the right abdominal wound. (Left abdominal wound was closed) The OR applications of Primatrix have been to help her grow the needed tissue to be able to do a skin graft eventually.  This wound will drain a lot which is why she should do at least daily dressing changes. Mother states she will not do this.  Home health is going to come 3 days per week and try a more absorbant dressing since mom won't do the dressing changes and patient is not able to do them. Mother voiced understanding.

## 2019-08-18 NOTE — Telephone Encounter (Signed)
Patient's mom called to say that the wound is open and it's draining a lot. Staples were recently taken out. Please call to advise.

## 2019-08-18 NOTE — Telephone Encounter (Signed)
Spoke with Ricard Dillon from [facility]. Home health is only able to go out 3 times per week. Mother refuses to do dressing changes on the other days. Patient is not able to do dressing changes herself.  Social work is looking into other options, but in the meantime Home Health will do dressing changes 3 times a week consisting of cleansing the wound, Fibracel (Absorptive dressing), gauze, ABDs. They will reassess as needed.

## 2019-08-18 NOTE — Telephone Encounter (Signed)
Received call from clinical manager Ricard Dillon, with [facility]. She states that patient was discharged from skilled nursing facility to home health with the assumption that she would have a willing and able caregiver. However, this is not the case. Home Health are not able to provide daily wound care but Lanora Manis, RN has an alternative plan for wound care which she would like to discuss further with clinical staff. Her number is: [phone].

## 2019-08-26 ENCOUNTER — Telehealth: Payer: Self-pay | Admitting: *Deleted

## 2019-08-26 NOTE — Telephone Encounter (Signed)
Received orders on (08/25/19) via of fax from [facility].  Requesting signature and date.  Given to provider to sign.  Orders signed and faxed to [facility].  Confirmation received.//AB/CMA

## 2019-08-27 ENCOUNTER — Telehealth (INDEPENDENT_AMBULATORY_CARE_PROVIDER_SITE_OTHER): Payer: Self-pay

## 2019-08-27 DIAGNOSIS — S31109D Unspecified open wound of abdominal wall, unspecified quadrant without penetration into peritoneal cavity, subsequent encounter: Secondary | ICD-10-CM

## 2019-08-27 NOTE — Telephone Encounter (Signed)
Called mother and patient answered the phone. Spoke with patient. Explained that we tentatively planned to take her to surgery July 6 to hopefully be able to skin graft the wound. Patient expressed that she wasn't sure she wanted another surgery.  Discussed with patient that with the wound as large as it is that it may be the best option to get the wound covered to stop 'leaking'. Told her we would need to see her in the office in the next two weeks to examine to wound before we could finalize plans for surgery. Encouraged her to come in to be seen either way so at least we could look at it and discuss options. Patient said she'd speak with her mother.

## 2019-08-27 NOTE — Telephone Encounter (Signed)
Received call from patient's mother requesting call back to discuss treatment plans for the wound. She is also concerned about the drainage that is present.

## 2019-09-01 ENCOUNTER — Telehealth: Payer: Self-pay

## 2019-09-01 ENCOUNTER — Telehealth: Payer: Self-pay | Admitting: Plastic Surgery

## 2019-09-01 NOTE — Telephone Encounter (Signed)
Kimberly Orozco will call patient to try schedule to come in this week.

## 2019-09-01 NOTE — Telephone Encounter (Signed)
Alyssa left vm requesting a nurse to call her regarding patient. No other information left on message. [phone]

## 2019-09-01 NOTE — Telephone Encounter (Signed)
Faxed signed order from Loistine Simas to [facility].

## 2019-09-01 NOTE — Telephone Encounter (Signed)
Spoke with Alyssa from home health.  They are concerned that patient's wound is not being cared for on the days they don't handle wound care. Concerned that condition will deteriorate. Report that wound is often uncovered when they arrive. Patient has been asked to make appointment to come in for [place] to look at the wound to determine if a skin graft or other option may be possible. Wound care nurse will remind them of this when they see them today or tomorrow. Home health wants to discontinue care in 2 weeks, however this would leave the patient with no wound care at all.  They will explore what options may be available.

## 2019-09-02 ENCOUNTER — Telehealth: Payer: Self-pay

## 2019-09-02 NOTE — Progress Notes (Signed)
Patient is a 44 year old female who underwent multiple debridements of the abdomen and right thigh due to necrotizing fasciitis.  Right and left abdominal wounds were repaired via complex closure and meshed prime matrix AG was applied to the right thigh wound on 07/08/2019.  The right abdominal wound dehisced.  Right abdominal wound was again repaired via complex closure and meshed prime matrix AG was applied again to the right thigh wound on 07/22/2019.  Right abdominal wound dehisced again.  Patient presents today for follow-up to assist current wound status and determine next steps.  Today patient presents with her mother, wound is uncovered except for patient's depends underwear. Mother is clear that she is not willing to do dressing changes for the wound. Patient has limited abilities and is not able to properly care for the wound herself. Home health has been coming in 3 days per week to change the dressing on the wound.   Mother leaves the room for exam as she does not want to see the wound. Patient has good granulation tissue formation on the right thigh and abdominal wound. Abdominal wound has decreased in size.  Left wound remains closed and is healing well. Incisions, c/d/i. No signs of infection. Drainage present from right thigh/abdominal wound.   Dr. Arita Miss was present for the entire exam and led discussion with patient and her mother. Discussed options of doing a skin graft or allowing the wound to heal itself. We discussed risks and benefits of both options.  Both patient and her mother express desire to move forward with a skin graft.   Surgery is currently scheduled for July 6.     Pictures were obtained of the patient and placed in the chart with the patient's or guardian's permission.

## 2019-09-02 NOTE — Telephone Encounter (Signed)
Faxed signed order from [facility] to [facility]

## 2019-09-03 ENCOUNTER — Other Ambulatory Visit: Payer: Self-pay

## 2019-09-03 ENCOUNTER — Ambulatory Visit (INDEPENDENT_AMBULATORY_CARE_PROVIDER_SITE_OTHER): Payer: Self-pay | Admitting: Plastic Surgery

## 2019-09-03 ENCOUNTER — Encounter: Payer: Self-pay | Admitting: Plastic Surgery

## 2019-09-03 VITALS — BP 117/82 | HR 116 | Temp 98.0°F | Ht 67.0 in | Wt 245.0 lb

## 2019-09-03 DIAGNOSIS — S71101D Unspecified open wound, right thigh, subsequent encounter: Secondary | ICD-10-CM

## 2019-09-03 DIAGNOSIS — S31109D Unspecified open wound of abdominal wall, unspecified quadrant without penetration into peritoneal cavity, subsequent encounter: Secondary | ICD-10-CM

## 2019-09-05 ENCOUNTER — Encounter (HOSPITAL_COMMUNITY): Payer: Self-pay

## 2019-09-05 ENCOUNTER — Emergency Department (HOSPITAL_COMMUNITY)
Admission: EM | Admit: 2019-09-05 | Discharge: 2019-09-06 | Disposition: A | Discharge status: Home / Self Care | Payer: Self-pay | Attending: Emergency Medicine | Admitting: Emergency Medicine

## 2019-09-05 ENCOUNTER — Emergency Department (HOSPITAL_COMMUNITY): Payer: Self-pay

## 2019-09-05 DIAGNOSIS — Z7984 Long term (current) use of oral hypoglycemic drugs: Secondary | ICD-10-CM | POA: Insufficient documentation

## 2019-09-05 DIAGNOSIS — R109 Unspecified abdominal pain: Secondary | ICD-10-CM | POA: Insufficient documentation

## 2019-09-05 DIAGNOSIS — Z4800 Encounter for change or removal of nonsurgical wound dressing: Secondary | ICD-10-CM | POA: Insufficient documentation

## 2019-09-05 DIAGNOSIS — Z5189 Encounter for other specified aftercare: Secondary | ICD-10-CM

## 2019-09-05 DIAGNOSIS — F17210 Nicotine dependence, cigarettes, uncomplicated: Secondary | ICD-10-CM | POA: Insufficient documentation

## 2019-09-05 DIAGNOSIS — E119 Type 2 diabetes mellitus without complications: Secondary | ICD-10-CM | POA: Insufficient documentation

## 2019-09-05 LAB — COMPREHENSIVE METABOLIC PANEL
ALT: 12 U/L (ref 0–44)
AST: 23 U/L (ref 15–41)
Albumin: 1.8 g/dL — ABNORMAL LOW (ref 3.5–5.0)
Alkaline Phosphatase: 145 U/L — ABNORMAL HIGH (ref 38–126)
Anion gap: 10 (ref 5–15)
BUN: <5 mg/dL — ABNORMAL LOW (ref 6–20)
CO2: 24 mmol/L (ref 22–32)
Calcium: 7.9 mg/dL — ABNORMAL LOW (ref 8.9–10.3)
Chloride: 103 mmol/L (ref 98–111)
Creatinine, Ser: 0.84 mg/dL (ref 0.44–1.00)
GFR calc Af Amer: >60 mL/min (ref >60)
GFR calc non Af Amer: >60 mL/min (ref >60)
Glucose, Bld: 107 mg/dL — ABNORMAL HIGH (ref 70–99)
Potassium: 3 mmol/L — ABNORMAL LOW (ref 3.5–5.1)
Sodium: 137 mmol/L (ref 135–145)
Total Bilirubin: 0.5 mg/dL (ref 0.3–1.2)
Total Protein: 6.5 g/dL (ref 6.5–8.1)

## 2019-09-05 LAB — CBC
HCT: 36.4 % (ref 36.0–46.0)
Hemoglobin: 11.1 g/dL — ABNORMAL LOW (ref 12.0–15.0)
MCH: 27.4 pg (ref 26.0–34.0)
MCHC: 30.5 g/dL (ref 30.0–36.0)
MCV: 89.9 fL (ref 80.0–100.0)
Platelets: 483 10*3/uL — ABNORMAL HIGH (ref 150–400)
RBC: 4.05 MIL/uL (ref 3.87–5.11)
RDW: 17.2 % — ABNORMAL HIGH (ref 11.5–15.5)
WBC: 9 10*3/uL (ref 4.0–10.5)
nRBC: 0 % (ref 0.0–0.2)

## 2019-09-05 MED ORDER — POTASSIUM CHLORIDE CRYS ER 20 MEQ PO TBCR
40.0000 meq | EXTENDED_RELEASE_TABLET | Freq: Once | ORAL | Status: AC
  Administered 2019-09-06: 40 meq via ORAL
  Filled 2019-09-05: qty 2

## 2019-09-05 MED ORDER — SODIUM CHLORIDE 0.9 % IV BOLUS
1000.0000 mL | Freq: Once | INTRAVENOUS | Status: AC
  Administered 2019-09-05: 1000 mL via INTRAVENOUS

## 2019-09-05 NOTE — ED Notes (Signed)
Pt ambulated to restroom with assistance.  

## 2019-09-05 NOTE — ED Notes (Signed)
Brenda, mother, [phone] would like an update when available  

## 2019-09-05 NOTE — ED Provider Notes (Signed)
[facility]EM[facility]ENC[facility] DEPT [facility] Emergency Department Provider Note MRN:  [id number]  Arrival date & time: 09/06/19     Chief Complaint   Wound Infection   History of Present Illness   Kimberly Orozco is a 44 y.o. year-old female with a history of schizophrenia, diabetes presenting to the ED with chief complaint of wound infection.  Patient is concerned about the bleeding from her wound.  Has been present for 3 months ever since she had a necrotizing infection.  She denies any worsening pain, denies fever, no chest pain or shortness of breath, no abdominal pain.  Symptoms are mild, constant, no exacerbating or alleviating factors.  Review of Systems  A complete 10 system review of systems was obtained and all systems are negative except as noted in the HPI and PMH.   Patient's Health History    Past Medical History:  Diagnosis Date  . Bipolar disorder (HCC)   . Diabetes mellitus without complication (HCC)   . Schizophrenia [facility])     Past Surgical History:  Procedure Laterality Date  . APPLICATION OF A-CELL OF EXTREMITY N/A 07/08/2019   Procedure: with placement of primatrix AG;  Surgeon: Allena Napoleon, MD;  Location: [facility] OR;  Service: Plastics;  Laterality: N/A;  . APPLICATION OF A-CELL OF EXTREMITY Right 07/22/2019   Procedure: placement of primatrix mesh;  Surgeon: Allena Napoleon, MD;  Location: [facility] OR;  Service: Plastics;  Laterality: Right;  . INCISION AND DRAINAGE OF WOUND N/A 06/26/2019   Procedure: IRRIGATION AND DEBRIDEMENT ABDOMINAL WALL AND GROIN;  Surgeon: Axel Filler, MD;  Location: [facility] OR;  Service: General;  Laterality: N/A;  . INCISION AND DRAINAGE OF WOUND N/A 07/08/2019   Procedure: Debridement of abdominal and groin wound;  Surgeon: Allena Napoleon, MD;  Location: [facility] OR;  Service: Plastics;  Laterality: N/A;  . INCISION AND DRAINAGE PERIRECTAL ABSCESS Right 06/24/2019   Procedure: IRRIGATION AND DEBRIDEMENT RIGHT GROIN AND RIGHT BUTTOCK;  Surgeon: Axel Filler, MD;  Location: [facility] OR;  Service: General;  Laterality: Right;  . IRRIGATION AND DEBRIDEMENT OF WOUND WITH SPLIT THICKNESS SKIN GRAFT Right 07/22/2019   Procedure: Debridement of right thigh wound;  Surgeon: Allena Napoleon, MD;  Location: [facility] OR;  Service: Plastics;  Laterality: Right;    Family History  Family history unknown: Yes    Social History   Socioeconomic History  . Marital status: Single    Spouse name: Not on file  . Number of children: Not on file  . Years of education: Not on file  . Highest education level: Not on file  Occupational History  . Not on file  Tobacco Use  . Smoking status: Current Every Day Smoker    Types: Cigarettes  . Smokeless tobacco: Never Used  Vaping Use  . Vaping Use: Never used  Substance and Sexual Activity  . Alcohol use: Not Currently  . Drug use: Never  . Sexual activity: Not on file  Other Topics Concern  . Not on file  Social History Narrative  . Not on file   Social Determinants of Health   Financial Resource Strain:   . Difficulty of Paying Living Expenses:   Food Insecurity:   . Worried About Programme researcher, broadcasting/film/video in the Last Year:   . Barista in the Last Year:   Transportation Needs:   . Freight forwarder (Medical):   Marland Kitchen Lack of Transportation (Non-Medical):   Physical Activity:   . Days of Exercise per  Week:   . Minutes of Exercise per Session:   Stress:   . Feeling of Stress :   Social Connections:   . Frequency of Communication with Friends and Family:   . Frequency of Social Gatherings with Friends and Family:   . Attends Religious Services:   . Active Member of Clubs or Organizations:   . Attends Archivist Meetings:   Marland Kitchen Marital Status:   Intimate Partner Violence:   . Fear of Current or Ex-Partner:   . Emotionally Abused:   Marland Kitchen Physically Abused:   . Sexually Abused:      Physical Exam   Vitals:   09/05/19 2130 09/05/19 2147  BP: (!) 145/94 (!) 150/98  Pulse: (!) 103 (!)  105  Resp: (!) 24 20  Temp: 98.2 F (36.8 C)   SpO2: 100% 100%    CONSTITUTIONAL: Well-appearing, NAD NEURO:  Alert and oriented to name, no focal neurological deficits EYES:  eyes equal and reactive ENT/NECK:  no LAD, no JVD CARDIO: Tachycardic rate, well-perfused, normal S1 and S2 PULM:  CTAB no wheezing or rhonchi GI/GU:  normal bowel sounds, non-distended, non-tender MSK/SPINE:  No gross deformities, no edema SKIN:  no rash, atraumatic PSYCH: Mildly disorganized speech and behavior  *Additional and/or pertinent findings included in MDM below  Diagnostic and Interventional Summary    EKG Interpretation  Date/Time:    Ventricular Rate:    PR Interval:    QRS Duration:   QT Interval:    QTC Calculation:   R Axis:     Text Interpretation:        Labs Reviewed  CBC - Abnormal; Notable for the following components:      Result Value   Hemoglobin 11.1 (*)    RDW 17.2 (*)    Platelets 483 (*)    All other components within normal limits  COMPREHENSIVE METABOLIC PANEL - Abnormal; Notable for the following components:   Potassium 3.0 (*)    Glucose, Bld 107 (*)    BUN <5 (*)    Calcium 7.9 (*)    Albumin 1.8 (*)    Alkaline Phosphatase 145 (*)    All other components within normal limits  CULTURE, BLOOD (SINGLE)    CT ABDOMEN PELVIS WO CONTRAST  Final Result      Medications  potassium chloride SA (KLOR-CON) CR tablet 40 mEq (has no administration in time range)  sodium chloride 0.9 % bolus 1,000 mL (0 mLs Intravenous Stopped 09/05/19 2333)     Procedures  /  Critical Care Procedures  ED Course and Medical Decision Making  I have reviewed the triage vital signs, the nursing notes, and pertinent available records from the EMR.  Listed above are laboratory and imaging tests that I personally ordered, reviewed, and interpreted and then considered in my medical decision making (see below for details).      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  She was just seen by her plastic surgeon 2 days ago with pictures that look largely the same as today's exam.  Large wound without any dressing currently, fibrinous material but no obvious infection.  Mildly tachycardic but normal blood pressure, not febrile.  Awaiting labs and CT to exclude deeper space infection or necrotizing infection.  If reassuring work-up, suspect patient will be able to be discharged.  She has established follow-up and operation scheduled for skin grafting.  Work-up is reassuring.  She has no significant bleeding from her wound, tiny spots  of blood, hemostatic.  CT scan reveals no subcutaneous air or obvious abscess, postoperative changes are evident.  I reevaluated the wound and again see no obvious signs of infection.  Patient's tachycardia is resolved after fluids, she is appropriate for discharge with close follow-up and strict return precautions.  Elmer Sow. Pilar Plate, MD [facility] Emergency Medicine [facility] [email or web address]  Final Clinical Impressions(s) / ED Diagnoses     ICD-10-CM   1. Visit for wound check  Z51.89     ED Discharge Orders    None       Discharge Instructions Discussed with and Provided to Patient:     Discharge Instructions     You were evaluated in the Emergency Department and after careful evaluation, we did not find any emergent condition requiring admission or further testing in the hospital.  Your exam/testing today is overall reassuring.  We do not see any signs of infection today.  Please continue to follow-up with your wound experts and continue to dress the wound as they have instructed.  Please return to the Emergency Department if you experience any worsening of your condition.  We encourage you to follow up with a primary care provider.  Thank you for allowing [place] to be a part of your care.       Sabas Sous, MD 09/06/19 [phone]

## 2019-09-05 NOTE — ED Triage Notes (Signed)
Pt BIB [facility] from home with a wound infection. Approx. a month ago the pt had a procedure to remove the necrotizing fascitis from groin and thigh area.  Pt could not obtain antibiotics post procedure and wound is now grown in size and has color changes and is oozing.  VSS with EMS.

## 2019-09-06 NOTE — Discharge Instructions (Addendum)
You were evaluated in the Emergency Department and after careful evaluation, we did not find any emergent condition requiring admission or further testing in the hospital.  Your exam/testing today is overall reassuring.  We do not see any signs of infection today.  Please continue to follow-up with your wound experts and continue to dress the wound as they have instructed.  Please return to the Emergency Department if you experience any worsening of your condition.  We encourage you to follow up with a primary care provider.  Thank you for allowing [place] to be a part of your care.

## 2019-09-06 NOTE — ED Notes (Signed)
Discharge instructions reviewed with pt. Pt verbalized understanding.  This RN called pt Mother Steward Drone and reviewed discharge instructions with her as well.

## 2019-09-06 NOTE — ED Notes (Signed)
Right inner thigh wound cleaned of purulent drainage, air dried, and dressing with ABD pads applied.

## 2019-09-07 ENCOUNTER — Emergency Department (HOSPITAL_COMMUNITY): Payer: Medicare Other

## 2019-09-07 ENCOUNTER — Inpatient Hospital Stay (HOSPITAL_COMMUNITY)
Admission: EM | Admit: 2019-09-07 | Discharge: 2019-09-15 | DRG: 373 | Disposition: A | Discharge status: Home Health | Payer: Medicare Other | Attending: Internal Medicine | Admitting: Internal Medicine

## 2019-09-07 DIAGNOSIS — F209 Schizophrenia, unspecified: Secondary | ICD-10-CM | POA: Diagnosis present

## 2019-09-07 DIAGNOSIS — T148XXA Other injury of unspecified body region, initial encounter: Secondary | ICD-10-CM | POA: Diagnosis present

## 2019-09-07 DIAGNOSIS — L089 Local infection of the skin and subcutaneous tissue, unspecified: Secondary | ICD-10-CM

## 2019-09-07 DIAGNOSIS — W1830XA Fall on same level, unspecified, initial encounter: Secondary | ICD-10-CM | POA: Diagnosis present

## 2019-09-07 DIAGNOSIS — Z20822 Contact with and (suspected) exposure to covid-19: Secondary | ICD-10-CM | POA: Diagnosis present

## 2019-09-07 DIAGNOSIS — E876 Hypokalemia: Secondary | ICD-10-CM | POA: Diagnosis present

## 2019-09-07 DIAGNOSIS — A0472 Enterocolitis due to Clostridium difficile, not specified as recurrent: Secondary | ICD-10-CM | POA: Diagnosis not present

## 2019-09-07 DIAGNOSIS — F319 Bipolar disorder, unspecified: Secondary | ICD-10-CM | POA: Diagnosis present

## 2019-09-07 DIAGNOSIS — S71101D Unspecified open wound, right thigh, subsequent encounter: Secondary | ICD-10-CM

## 2019-09-07 DIAGNOSIS — S31104D Unspecified open wound of abdominal wall, left lower quadrant without penetration into peritoneal cavity, subsequent encounter: Secondary | ICD-10-CM

## 2019-09-07 DIAGNOSIS — X58XXXD Exposure to other specified factors, subsequent encounter: Secondary | ICD-10-CM | POA: Diagnosis present

## 2019-09-07 DIAGNOSIS — E669 Obesity, unspecified: Secondary | ICD-10-CM | POA: Diagnosis present

## 2019-09-07 DIAGNOSIS — L0390 Cellulitis, unspecified: Secondary | ICD-10-CM

## 2019-09-07 DIAGNOSIS — L8990 Pressure ulcer of unspecified site, unspecified stage: Secondary | ICD-10-CM | POA: Insufficient documentation

## 2019-09-07 DIAGNOSIS — Z6833 Body mass index (BMI) 33.0-33.9, adult: Secondary | ICD-10-CM

## 2019-09-07 DIAGNOSIS — E1169 Type 2 diabetes mellitus with other specified complication: Secondary | ICD-10-CM | POA: Diagnosis present

## 2019-09-07 DIAGNOSIS — A072 Cryptosporidiosis: Secondary | ICD-10-CM | POA: Diagnosis present

## 2019-09-07 DIAGNOSIS — Z7984 Long term (current) use of oral hypoglycemic drugs: Secondary | ICD-10-CM

## 2019-09-07 DIAGNOSIS — E1165 Type 2 diabetes mellitus with hyperglycemia: Secondary | ICD-10-CM | POA: Diagnosis present

## 2019-09-07 DIAGNOSIS — Z79899 Other long term (current) drug therapy: Secondary | ICD-10-CM

## 2019-09-07 DIAGNOSIS — L89302 Pressure ulcer of unspecified buttock, stage 2: Secondary | ICD-10-CM | POA: Diagnosis present

## 2019-09-07 DIAGNOSIS — F17210 Nicotine dependence, cigarettes, uncomplicated: Secondary | ICD-10-CM | POA: Diagnosis present

## 2019-09-07 LAB — CK: Total CK: 35 U/L — ABNORMAL LOW (ref 38–234)

## 2019-09-07 LAB — URINALYSIS, ROUTINE W REFLEX MICROSCOPIC
Bilirubin Urine: NEGATIVE
Glucose, UA: NEGATIVE mg/dL
Ketones, ur: NEGATIVE mg/dL
Nitrite: NEGATIVE
Protein, ur: 100 mg/dL — AB
Specific Gravity, Urine: 1.029 (ref 1.005–1.030)
WBC, UA: >50 WBC/hpf — ABNORMAL HIGH (ref 0–5)
pH: 5 (ref 5.0–8.0)

## 2019-09-07 LAB — COMPREHENSIVE METABOLIC PANEL
ALT: 13 U/L (ref 0–44)
AST: 18 U/L (ref 15–41)
Albumin: 1.6 g/dL — ABNORMAL LOW (ref 3.5–5.0)
Alkaline Phosphatase: 153 U/L — ABNORMAL HIGH (ref 38–126)
Anion gap: 10 (ref 5–15)
BUN: <5 mg/dL — ABNORMAL LOW (ref 6–20)
CO2: 21 mmol/L — ABNORMAL LOW (ref 22–32)
Calcium: 7.8 mg/dL — ABNORMAL LOW (ref 8.9–10.3)
Chloride: 106 mmol/L (ref 98–111)
Creatinine, Ser: 1.02 mg/dL — ABNORMAL HIGH (ref 0.44–1.00)
GFR calc Af Amer: >60 mL/min (ref >60)
GFR calc non Af Amer: >60 mL/min (ref >60)
Glucose, Bld: 102 mg/dL — ABNORMAL HIGH (ref 70–99)
Potassium: 3.1 mmol/L — ABNORMAL LOW (ref 3.5–5.1)
Sodium: 137 mmol/L (ref 135–145)
Total Bilirubin: 0.4 mg/dL (ref 0.3–1.2)
Total Protein: 6 g/dL — ABNORMAL LOW (ref 6.5–8.1)

## 2019-09-07 LAB — CBC WITH DIFFERENTIAL/PLATELET
Abs Immature Granulocytes: 0.03 10*3/uL (ref 0.00–0.07)
Basophils Absolute: 0 10*3/uL (ref 0.0–0.1)
Basophils Relative: 0 %
Eosinophils Absolute: 0.1 10*3/uL (ref 0.0–0.5)
Eosinophils Relative: 1 %
HCT: 36.4 % (ref 36.0–46.0)
Hemoglobin: 11.1 g/dL — ABNORMAL LOW (ref 12.0–15.0)
Immature Granulocytes: 0 %
Lymphocytes Relative: 12 %
Lymphs Abs: 1.3 10*3/uL (ref 0.7–4.0)
MCH: 27.7 pg (ref 26.0–34.0)
MCHC: 30.5 g/dL (ref 30.0–36.0)
MCV: 90.8 fL (ref 80.0–100.0)
Monocytes Absolute: 0.9 10*3/uL (ref 0.1–1.0)
Monocytes Relative: 8 %
Neutro Abs: 8.5 10*3/uL — ABNORMAL HIGH (ref 1.7–7.7)
Neutrophils Relative %: 79 %
Platelets: 525 10*3/uL — ABNORMAL HIGH (ref 150–400)
RBC: 4.01 MIL/uL (ref 3.87–5.11)
RDW: 17.2 % — ABNORMAL HIGH (ref 11.5–15.5)
WBC: 10.8 10*3/uL — ABNORMAL HIGH (ref 4.0–10.5)
nRBC: 0 % (ref 0.0–0.2)

## 2019-09-07 LAB — I-STAT BETA HCG BLOOD, ED (MC, WL, AP ONLY): I-stat hCG, quantitative: <5 m[IU]/mL (ref <5)

## 2019-09-07 LAB — LACTIC ACID, PLASMA: Lactic Acid, Venous: 1.2 mmol/L (ref 0.5–1.9)

## 2019-09-07 MED ORDER — VANCOMYCIN HCL 2000 MG/400ML IV SOLN
2000.0000 mg | Freq: Once | INTRAVENOUS | Status: AC
  Administered 2019-09-07: 2000 mg via INTRAVENOUS
  Filled 2019-09-07: qty 400

## 2019-09-07 MED ORDER — SODIUM CHLORIDE 0.9 % IV BOLUS
1000.0000 mL | Freq: Once | INTRAVENOUS | Status: AC
  Administered 2019-09-07: 1000 mL via INTRAVENOUS

## 2019-09-07 MED ORDER — VANCOMYCIN HCL 750 MG/150ML IV SOLN
750.0000 mg | Freq: Two times a day (BID) | INTRAVENOUS | Status: DC
  Administered 2019-09-08: 750 mg via INTRAVENOUS
  Filled 2019-09-07 (×2): qty 150

## 2019-09-07 MED ORDER — SODIUM CHLORIDE 0.9% FLUSH: 3.0000 mL | Freq: Once | INTRAVENOUS | Status: DC

## 2019-09-07 MED ORDER — IOHEXOL 300 MG/ML  SOLN
100.0000 mL | Freq: Once | INTRAMUSCULAR | Status: AC | PRN
  Administered 2019-09-07: 100 mL via INTRAVENOUS

## 2019-09-07 MED ORDER — PIPERACILLIN-TAZOBACTAM 3.375 G IVPB 30 MIN
3.3750 g | Freq: Once | INTRAVENOUS | Status: AC
  Administered 2019-09-07: 3.375 g via INTRAVENOUS
  Filled 2019-09-07: qty 50

## 2019-09-07 NOTE — ED Notes (Signed)
Pt rotated to right side w/ pillows under left for comfort

## 2019-09-07 NOTE — ED Notes (Signed)
Steward Drone, mother, [phone] would like an update when available

## 2019-09-07 NOTE — ED Provider Notes (Signed)
[facility] EMERGENCY DEPARTMENT Provider Note   CSN: [id number] Arrival date & time: 09/07/19  1620     History Chief Complaint  Patient presents with  . Wound Infection    Kimberly Orozco is a 44 y.o. female.  HPI   Patient is a 44 year old female with a history of bipolar disorder, diabetes, schizophrenia, with recent necrotizing skin infection in April that was debrided, presenting to the emergency department today for evaluation after a fall.  Patient states when she woke up this morning she felt stiff all over and has generalized pain.  She tried to stand up when she was getting out of bed and she fell forward onto her bilateral knees.  She denies any head trauma or LOC but was unable to get up on her own and had to require assistance from one of her family members to get up.  She normally ambulates by herself.  She denies any fevers or other systemic symptoms.  States her wound has been being managed by wound care nurses to come to the house.  6:49 PM Obtain further history from patient's mother, Steward Drone.  She states the patient fell this morning.  She confirms that the patient fell to her knees and required assistance in getting up.  States patient has been sweating today and has been tremulous.  With regards to the wound she states that she does not look at it and does not know if it looks any worse than normal.  She is not sure if the patient has had fevers at home because she does not know how to use her thermometer.   Past Medical History:  Diagnosis Date  . Bipolar disorder (HCC)   . Diabetes mellitus without complication (HCC)   . Schizophrenia [facility])     Patient Active Problem List   Diagnosis Date Noted  . Open abdominal wall wound 08/04/2019  . Open thigh wound, right, subsequent encounter 08/04/2019  . Leukocytosis   . Septic shock (HCC)   . AKI (acute kidney injury) (HCC)   . Acute respiratory failure (HCC)   . Necrotizing cellulitis   . DKA  (diabetic ketoacidoses) (HCC) 06/23/2019  . Diabetes mellitus type 2 in obese (HCC) 04/27/2019  . Bipolar disorder (HCC) 04/26/2019  . Schizophrenia (HCC) 04/25/2019    Past Surgical History:  Procedure Laterality Date  . APPLICATION OF A-CELL OF EXTREMITY N/A 07/08/2019   Procedure: with placement of primatrix AG;  Surgeon: Allena Napoleon, MD;  Location: [facility] OR;  Service: Plastics;  Laterality: N/A;  . APPLICATION OF A-CELL OF EXTREMITY Right 07/22/2019   Procedure: placement of primatrix mesh;  Surgeon: Allena Napoleon, MD;  Location: [facility] OR;  Service: Plastics;  Laterality: Right;  . INCISION AND DRAINAGE OF WOUND N/A 06/26/2019   Procedure: IRRIGATION AND DEBRIDEMENT ABDOMINAL WALL AND GROIN;  Surgeon: Axel Filler, MD;  Location: [facility] OR;  Service: General;  Laterality: N/A;  . INCISION AND DRAINAGE OF WOUND N/A 07/08/2019   Procedure: Debridement of abdominal and groin wound;  Surgeon: Allena Napoleon, MD;  Location: [facility] OR;  Service: Plastics;  Laterality: N/A;  . INCISION AND DRAINAGE PERIRECTAL ABSCESS Right 06/24/2019   Procedure: IRRIGATION AND DEBRIDEMENT RIGHT GROIN AND RIGHT BUTTOCK;  Surgeon: Axel Filler, MD;  Location: [facility] OR;  Service: General;  Laterality: Right;  . IRRIGATION AND DEBRIDEMENT OF WOUND WITH SPLIT THICKNESS SKIN GRAFT Right 07/22/2019   Procedure: Debridement of right thigh wound;  Surgeon: Allena Napoleon, MD;  Location:  [place] OR;  Service: Plastics;  Laterality: Right;     OB History   No obstetric history on file.     Family History  Family history unknown: Yes    Social History   Tobacco Use  . Smoking status: Current Every Day Smoker    Types: Cigarettes  . Smokeless tobacco: Never Used  Vaping Use  . Vaping Use: Never used  Substance Use Topics  . Alcohol use: Not Currently  . Drug use: Never    Home Medications Prior to Admission medications   Medication Sig Start Date End Date Taking? Authorizing Provider  acetaminophen (TYLENOL) 325  MG tablet Take 650 mg by mouth every 6 (six) hours as needed for mild pain or headache.    [provider]  benztropine (COGENTIN) 1 MG tablet Take 1 tablet (1 mg total) by mouth 2 (two) times daily. Patient not taking: Reported on 09/04/2019 05/02/19   Johnn Hai, MD  divalproex (DEPAKOTE SPRINKLE) 125 MG capsule Take 2 capsules (250 mg total) by mouth 2 (two) times daily. Patient not taking: Reported on 09/04/2019 05/02/19   Johnn Hai, MD  haloperidol (HALDOL) 10 MG tablet Take 1 tablet (10 mg total) by mouth every morning. 07/24/19   Little Ishikawa, MD  haloperidol (HALDOL) 20 MG tablet Take 1 tablet (20 mg total) by mouth at bedtime. 07/24/19   Little Ishikawa, MD  insulin aspart (NOVOLOG) 100 UNIT/ML injection Inject 4 Units into the skin 3 (three) times daily with meals. Patient not taking: Reported on 09/04/2019 05/02/19   Johnn Hai, MD  metFORMIN (GLUCOPHAGE-XR) 500 MG 24 hr tablet Take 1 tablet (500 mg total) by mouth daily with supper. 05/02/19   Johnn Hai, MD  propranolol ER (INDERAL LA) 80 MG 24 hr capsule Take 1 capsule (80 mg total) by mouth daily. Patient not taking: Reported on 09/04/2019 05/02/19 05/01/20  Johnn Hai, MD  temazepam (RESTORIL) 30 MG capsule Take 1 capsule (30 mg total) by mouth at bedtime. Patient not taking: Reported on 09/04/2019 07/24/19   Little Ishikawa, MD    Allergies    Patient has no known allergies.  Review of Systems   Review of Systems  Constitutional: Negative for fever.  HENT: Negative for ear pain and sore throat.   Eyes: Negative for visual disturbance.  Respiratory: Negative for cough and shortness of breath.   Cardiovascular: Negative for chest pain.  Gastrointestinal: Negative for abdominal pain, nausea and vomiting.  Genitourinary: Negative for dysuria.  Musculoskeletal: Positive for myalgias.       Generalized "stiffness"  Skin: Positive for wound.  Neurological: Negative for headaches.  All other systems  reviewed and are negative.   Physical Exam Updated Vital Signs BP (!) 117/106   Pulse (!) 101   Temp 99.3 F (37.4 C) (Rectal)   Resp (!) 25   SpO2 100%   Physical Exam Vitals and nursing note reviewed.  Constitutional:      General: She is not in acute distress.    Appearance: She is well-developed.  HENT:     Head: Normocephalic and atraumatic.  Eyes:     Extraocular Movements: Extraocular movements intact.     Conjunctiva/sclera: Conjunctivae normal.     Pupils: Pupils are equal, round, and reactive to light.  Cardiovascular:     Rate and Rhythm: Normal rate and regular rhythm.     Heart sounds: No murmur heard.   Pulmonary:     Effort: Pulmonary effort is normal. No respiratory  distress.     Breath sounds: Normal breath sounds.  Abdominal:     Palpations: Abdomen is soft.     Tenderness: There is no abdominal tenderness.  Musculoskeletal:     Cervical back: Neck supple.     Comments: TTP to the bilateral knees.  Normal range of motion.  No pain at the bilateral hips.  No swelling or obvious deformity to the knees.  No TTP to the bilateral tib-fib's or ankles.  Skin:    General: Skin is warm and dry.     Comments: Large malodorous wound to the right pelvic and right upper thigh area. There is an additional area of erythema and warmth to the left labia and mons pubis. No obvious crepitus on exam. (when compared to prior photos from visit with Dr. Arita Miss a few days ago this looks new) -see photo below.  Neurological:     Mental Status: She is alert.     Comments: Cranial nerves II through XII intact, globally weak however strength to the bilateral upper and lower extremities is symmetric         ED Results / Procedures / Treatments   Labs (all labs ordered are listed, but only abnormal results are displayed) Labs Reviewed  COMPREHENSIVE METABOLIC PANEL - Abnormal; Notable for the following components:      Result Value   Potassium 3.1 (*)    CO2 21 (*)     Glucose, Bld 102 (*)    BUN <5 (*)    Creatinine, Ser 1.02 (*)    Calcium 7.8 (*)    Total Protein 6.0 (*)    Albumin 1.6 (*)    Alkaline Phosphatase 153 (*)    All other components within normal limits  CBC WITH DIFFERENTIAL/PLATELET - Abnormal; Notable for the following components:   WBC 10.8 (*)    Hemoglobin 11.1 (*)    RDW 17.2 (*)    Platelets 525 (*)    Neutro Abs 8.5 (*)    All other components within normal limits  URINALYSIS, ROUTINE W REFLEX MICROSCOPIC - Abnormal; Notable for the following components:   Color, Urine AMBER (*)    APPearance CLOUDY (*)    Hgb urine dipstick LARGE (*)    Protein, ur 100 (*)    Leukocytes,Ua MODERATE (*)    WBC, UA >50 (*)    Bacteria, UA MANY (*)    All other components within normal limits  CK - Abnormal; Notable for the following components:   Total CK 35 (*)    All other components within normal limits  CULTURE, BLOOD (ROUTINE X 2)  CULTURE, BLOOD (ROUTINE X 2)  SARS CORONAVIRUS 2 BY RT PCR (HOSPITAL ORDER, PERFORMED IN [facility])  URINE CULTURE  LACTIC ACID, PLASMA  LACTIC ACID, PLASMA  I-STAT BETA HCG BLOOD, ED ([facility], [facility], [facility] ONLY)    EKG None  Radiology CT ABDOMEN PELVIS WO CONTRAST  Result Date: 09/05/2019 CLINICAL DATA:  Abdominal and thigh pain EXAM: CT ABDOMEN AND PELVIS WITHOUT CONTRAST TECHNIQUE: Multidetector CT imaging of the abdomen and pelvis was performed following the standard protocol without IV contrast. COMPARISON:  07/13/2019 FINDINGS: LOWER CHEST: Normal. HEPATOBILIARY: Normal hepatic contours. No intra- or extrahepatic biliary dilatation. There is cholelithiasis without acute inflammation. PANCREAS: Normal pancreas. No ductal dilatation or peripancreatic fluid collection. SPLEEN: Normal. ADRENALS/URINARY TRACT: The adrenal glands are normal. No hydronephrosis, nephroureterolithiasis or solid renal mass. The urinary bladder is normal for degree of distention STOMACH/BOWEL: There is no  hiatal  hernia. Normal duodenal course and caliber. No small bowel dilatation or inflammation. No focal colonic abnormality. Normal appendix. VASCULAR/LYMPHATIC: Normal course and caliber of the major abdominal vessels. No abdominal or pelvic lymphadenopathy. REPRODUCTIVE: Globular uterus with multiple fibroids. MUSCULOSKELETAL. No osseous abnormality. There is skin thickening of the anterior right thigh extending approximately half the length of the femur. There is an area equal soft tissue density that extends into the deep soft tissues along the adductor longus muscle (series 3, image 12). There is surgical clips near the adductor longus insertion site (series 3, image 128), so this area may have been part of the recent surgery. There is no soft tissue gas. There is a posterior thigh subcutaneous collection measuring 2.7 cm that is likely a sebaceous cyst. OTHER: None. IMPRESSION: 1. Skin thickening of the anterior right thigh extending approximately half the length of the femur, with soft tissue density material extending deep along the adductor longus muscle. It is unclear how much of this is postsurgical change versus active infection. 2. Cholelithiasis without acute inflammation. 3. Fibroid uterus. Electronically Signed   By: Deatra Robinson M.D.   On: 09/05/2019 23:36   DG Chest 2 View  Result Date: 09/07/2019 CLINICAL DATA:  Infection EXAM: CHEST - 2 VIEW COMPARISON:  06/23/2019 07/08/2019 FINDINGS: The heart size and mediastinal contours are within normal limits. Both lungs are clear. The visualized skeletal structures are unremarkable. IMPRESSION: No active cardiopulmonary disease. Electronically Signed   By: Katherine Mantle M.D.   On: 09/07/2019 17:09   CT ABDOMEN PELVIS W CONTRAST  Result Date: 09/07/2019 CLINICAL DATA:  Drainage from lower abdominal/groin wound. Assess for deeper infection. EXAM: CT ABDOMEN AND PELVIS WITH CONTRAST TECHNIQUE: Multidetector CT imaging of the abdomen and pelvis was  performed using the standard protocol following bolus administration of intravenous contrast. CONTRAST:  OMNIPAQUE IOHEXOL 300 MG/ML  SOLN COMPARISON:  09/05/2019 FINDINGS: Lower chest: Lung bases are clear. No effusions. Heart is normal size. Hepatobiliary: Diffuse fatty infiltration of the liver. No focal abnormality. 2.3 cm gallstone within the gallbladder. Pancreas: No focal abnormality or ductal dilatation. Spleen: No focal abnormality.  Normal size. Adrenals/Urinary Tract: No adrenal abnormality. No focal renal abnormality. No stones or hydronephrosis. Urinary bladder is unremarkable. Stomach/Bowel: Normal appendix. Stomach, large and small bowel grossly unremarkable. Vascular/Lymphatic: No evidence of aneurysm or adenopathy. Reproductive: Enlarged uterus with multiple fibroids. No adnexal masses. Other: No free fluid or free air. Musculoskeletal: Again noted is skin thickening along the anterior right groin region and upper thigh. Soft tissue extends from the thickened skin to the adductor longus muscle, similar to prior study. No drainable fluid collection. No change since prior study. No acute bony abnormality. IMPRESSION: Continued skin thickening in the right groin region extending into the anterior thigh. The soft tissue extends deep to the adductor longus muscle and is stable since prior study. No drainable focal fluid collection. It is unclear how much of this reflects postsurgical changes/scarring versus infection. Enlarged fibroid uterus. Diffuse fatty infiltration of the liver. Cholelithiasis. Electronically Signed   By: Charlett Nose M.D.   On: 09/07/2019 22:32   DG Knee Complete 4 Views Left  Result Date: 09/07/2019 CLINICAL DATA:  Pain EXAM: LEFT KNEE - COMPLETE 4+ VIEW COMPARISON:  None. FINDINGS: There is no acute displaced fracture. There is no dislocation. Mild tricompartmental degenerative changes are noted. IMPRESSION: No acute displaced fracture or dislocation. Mild  tricompartmental degenerative changes. Electronically Signed   By: Beryle Quant.D.  On: 09/07/2019 19:55   DG Knee Complete 4 Views Right  Result Date: 09/07/2019 CLINICAL DATA:  Pain status post fall EXAM: RIGHT KNEE - COMPLETE 4+ VIEW COMPARISON:  None. FINDINGS: Tricompartmental degenerative changes are noted. There is no acute displaced fracture. There is no dislocation. There is nonspecific soft tissue swelling about the lower extremity. IMPRESSION: No acute displaced fracture or dislocation. Tricompartmental degenerative changes are noted. Electronically Signed   By: Katherine Mantle M.D.   On: 09/07/2019 19:51    Procedures Procedures (including critical care time)  Medications Ordered in ED Medications  sodium chloride flush (NS) 0.9 % injection 3 mL (3 mLs Intravenous Not Given 09/07/19 2257)  vancomycin (VANCOREADY) IVPB 2000 mg/400 mL (has no administration in time range)  vancomycin (VANCOREADY) IVPB 750 mg/150 mL (has no administration in time range)  piperacillin-tazobactam (ZOSYN) IVPB 3.375 g (0 g Intravenous Stopped 09/07/19 2135)  sodium chloride 0.9 % bolus 1,000 mL (1,000 mLs Intravenous New Bag/Given 09/07/19 2101)  iohexol (OMNIPAQUE) 300 MG/ML solution 100 mL (100 mLs Intravenous Contrast Given 09/07/19 2227)    ED Course  I have reviewed the triage vital signs and the nursing notes.  Pertinent labs & imaging results that were available during my care of the patient were reviewed by me and considered in my medical decision making (see chart for details).    MDM Rules/Calculators/A&P                          45 year old female presenting for evaluation after a fall.  Complaining of bilateral knee pain.  Also complaining of myalgias and generalized body stiffness.  On exam appears to have new area of erythema to the left labia/mons pubis  Patient afebrile, marginally tachycardic with no hypotension.  We will get labs, lactic acid, blood cultures and imaging  of the pelvis  CBC shows a mild leukocytosis at 10.8, anemia present but stable CMP with hypokalemia which is chronic, kidney and liver function are wnl.  Lactic acid is negative Blood cultures obtained CK nonacute UA appears possibly infected but also contaminated, will culture  7:06 PM CONSULT with Dr. Arita Miss, with plastic surgery. He reviewed imaging on chart and agrees that the left labial area was not erythematous on prior visit so this appears to be a new area of concern. Agrees with admission and recommends consultation with general surgery given that they did the initial debridement. He will see the patient in consultation during admission.    7:18 PM CONSULT with Dr. Andrey Campanile who is in agreement with CT pelvis. Recommends reconsulting if significant abnormalities on CT that would require intervention tonight.   CT abd/pelvis 1. Skin thickening of the anterior right thigh extending approximately half the length of the femur, with soft tissue density material extending deep along the adductor longus muscle. It is unclear how much of this is postsurgical change versus active infection. 2. Cholelithiasis without acute inflammation. 3. Fibroid uterus.  Pt with recent h/o necrotizing fasciitis here today with generalized body aches and fall. Found to have new area of cellulitis on exam. Afebrile but with mild leukocytosis. abx given. Plan for admission.   10:59 PM CONSULT with Dr. Maisie Fus who accepts patient for admission.   Final Clinical Impression(s) / ED Diagnoses Final diagnoses:  Cellulitis, unspecified cellulitis site    Rx / DC Orders ED Discharge Orders    None       Karrie Meres, PA-C 09/07/19 2301  Margarita Grizzleay, Danielle, MD 09/11/19 [phone]

## 2019-09-07 NOTE — ED Notes (Signed)
The pts antibiotics had to be interepted due to  A c-t with contrast and the only iv that is   Good for c-t is the one getting the antibiotics

## 2019-09-07 NOTE — Progress Notes (Signed)
Pharmacy Antibiotic Note  Kimberly Orozco is a 44 y.o. female admitted on 09/07/2019 with cellulitis.  Pharmacy has been consulted for vancomycin dosing. WBC 10.8. SCr 1.02. LA 1.2   Plan: -Vancomycin 2 gm IV load then vancomycin 750 mg IV Q 12 hours -Monitor CBC, renal fx, cultures and clinical progress -VT at SS      Temp (24hrs), Avg:99.3 F (37.4 C), Min:99.3 F (37.4 C), Max:99.3 F (37.4 C)  Recent Labs  Lab 09/05/19 2141 09/07/19 1636  WBC 9.0 10.8*  CREATININE 0.84 1.02*  LATICACIDVEN  --  1.2    Estimated Creatinine Clearance: 90.4 mL/min (A) (by C-G formula based on SCr of 1.02 mg/dL (H)).    No Known Allergies  Antimicrobials this admission: Vanc 6/27 >>   Dose adjustments this admission:  Microbiology results:   Thank you for allowing pharmacy to be a part of this patient's care.  Vinnie Level, PharmD., BCPS, BCCCP Clinical Pharmacist Clinical phone for 09/07/19 until 9:30pm: [phone] If after 9:30pm, please refer to [facility] for unit-specific pharmacist

## 2019-09-07 NOTE — ED Triage Notes (Signed)
To triage via EMS.  Mom reports pts wound on lower abd/groin has increased drainage and odor.  C/o back pain, is abel to stand and move.    Mom  Reported to EMS that pt needs to stay at hospital d/t the amount of care pt needs.  EMS BP 112/70 HR 104 CBG 128 RR 26-30

## 2019-09-07 NOTE — ED Notes (Signed)
Pillows removed from under pts left side because pt stated it felt like she had no room

## 2019-09-07 NOTE — ED Notes (Signed)
To ct

## 2019-09-07 NOTE — H&P (Addendum)
History and Physical    Kimberly Orozco WSF:[id number] DOB: September 20, 1975 DOA: 09/07/2019  PCP: Patient, No Pcp Per  Patient coming from: home  I have personally briefly reviewed patient's old medical records in [facility]  Chief Complaint: wound infection Marletta Lor  HPI: Kimberly Orozco is a 44 y.o. female with medical history significant of  bipolar disorder, diabetes, schizophrenia, recent history of necrotizing fasciitis affecting abdomen and right thigh in  April s/p multiple debridements. Course complicated by right abdominal wound dehiscence x2. Patient currently is followed by Dr Arita Miss and has plans for skin graft to assist in wound healing on July 6. Patient now presents to ed s/p fall and on evaluation found to have wound infection. Case was discussed with Dr Arita Miss who recommended admission and antibiotic treatment with Plastic consult in am.  Of note patient was seen by plastic onf 09/03/19, ED6/25/21 for wound check and noted to have stable wound on those visits. Of note patient has home health 3 times/ week, however needs wound changes daily. Currently per notes mother is not willing to do wound changes and patient is incapable of completing wound changes herself.  Patient currently states she came to ed after fall today. She states over the last 3-4 days she has had intermittent loose stools, no n/v or fever/chills or abdominal pain. She states that this am when she woke up she felt stiff and weak and due to this has a fall.  During ed evaluation patient  Xray s/p fall noted no fracture. However there was concern regarding her right abdominal and thigh wound. On evaluation there was concern for wound infection. Dr Arita Miss was consulted who evaluated current picture of the wound and concurred that patient possible had wound infection and recommended admission for IV antibiotics.  ED Course:  Afeb, bp 150/89/ hr 101, rr22 sat 100%  Labs:wbc10.8,hgb 11.1, plt 525, lactic 1.2 Na137, K3.1 ,bicarb  21cr 1.02 up from 0.84, albumin 1.6  Knee xray: No acute displaced fracture or dislocation. Tricompartmental degenerative changes are noted Cxr: NAD CT abd/pelvis: IMPRESSION: Continued skin thickening in the right groin region extending into the anterior thigh. The soft tissue extends deep to the adductor longus muscle and is stable since prior study. No drainable focal fluid collection. It is unclear how much of this reflects postsurgical changes/scarring versus infection.  Enlarged fibroid uterus.  Diffuse fatty infiltration of the liver.  Cholelithiasis.  TX: Zosyn,VANC 1L nsx2  Review of Systems: As per HPI otherwise 10 point review of systems negative.   Past Medical History:  Diagnosis Date   Bipolar disorder (HCC)    Diabetes mellitus without complication (HCC)    Schizophrenia (HCC)     Past Surgical History:  Procedure Laterality Date   APPLICATION OF A-CELL OF EXTREMITY N/A 07/08/2019   Procedure: with placement of primatrix AG;  Surgeon: Allena Napoleon, MD;  Location: [facility] OR;  Service: Plastics;  Laterality: N/A;   APPLICATION OF A-CELL OF EXTREMITY Right 07/22/2019   Procedure: placement of primatrix mesh;  Surgeon: Allena Napoleon, MD;  Location: [facility] OR;  Service: Plastics;  Laterality: Right;   INCISION AND DRAINAGE OF WOUND N/A 06/26/2019   Procedure: IRRIGATION AND DEBRIDEMENT ABDOMINAL WALL AND GROIN;  Surgeon: Axel Filler, MD;  [facility];  Service: General;  Laterality: N/A;   INCISION AND DRAINAGE OF WOUND N/A 07/08/2019   Procedure: Debridement of abdominal and groin wound;  Surgeon: Allena Napoleon, MD;  Location: [facility] OR;  Service: Plastics;  Laterality: N/A;   INCISION AND DRAINAGE PERIRECTAL ABSCESS Right 06/24/2019   Procedure: IRRIGATION AND DEBRIDEMENT RIGHT GROIN AND RIGHT BUTTOCK;  Surgeon: Axel Filler, MD;  Location: [facility] OR;  Service: General;  Laterality: Right;   IRRIGATION AND DEBRIDEMENT OF WOUND WITH SPLIT THICKNESS  SKIN GRAFT Right 07/22/2019   Procedure: Debridement of right thigh wound;  Surgeon: Allena Napoleon, MD;  Location: [facility] OR;  Service: Plastics;  Laterality: Right;     reports that she has been smoking cigarettes. She has never used smokeless tobacco. She reports previous alcohol use. She reports that she does not use drugs.  No Known Allergies  Family History  Family history unknown: Yes    Prior to Admission medications   Medication Sig Start Date End Date Taking? Authorizing Provider  acetaminophen (TYLENOL) 325 MG tablet Take 650 mg by mouth every 6 (six) hours as needed for mild pain or headache.    [provider]  benztropine (COGENTIN) 1 MG tablet Take 1 tablet (1 mg total) by mouth 2 (two) times daily. Patient not taking: Reported on 09/04/2019 05/02/19   Malvin Johns, MD  divalproex (DEPAKOTE SPRINKLE) 125 MG capsule Take 2 capsules (250 mg total) by mouth 2 (two) times daily. Patient not taking: Reported on 09/04/2019 05/02/19   Malvin Johns, MD  haloperidol (HALDOL) 10 MG tablet Take 1 tablet (10 mg total) by mouth every morning. 07/24/19   Azucena Fallen, MD  haloperidol (HALDOL) 20 MG tablet Take 1 tablet (20 mg total) by mouth at bedtime. 07/24/19   Azucena Fallen, MD  insulin aspart (NOVOLOG) 100 UNIT/ML injection Inject 4 Units into the skin 3 (three) times daily with meals. Patient not taking: Reported on 09/04/2019 05/02/19   Malvin Johns, MD  metFORMIN (GLUCOPHAGE-XR) 500 MG 24 hr tablet Take 1 tablet (500 mg total) by mouth daily with supper. 05/02/19   Malvin Johns, MD  propranolol ER (INDERAL LA) 80 MG 24 hr capsule Take 1 capsule (80 mg total) by mouth daily. Patient not taking: Reported on 09/04/2019 05/02/19 05/01/20  Malvin Johns, MD  temazepam (RESTORIL) 30 MG capsule Take 1 capsule (30 mg total) by mouth at bedtime. Patient not taking: Reported on 09/04/2019 07/24/19   Azucena Fallen, MD    Physical Exam: Vitals:   09/07/19 2008 09/07/19 2015  09/07/19 2045 09/07/19 2259  BP: (!) 154/107 (!) 152/110 (!) 117/106   Pulse: 100 (!) 101 (!) 101   Resp: 20 (!) 28 (!) 25   Temp:    99.3 F (37.4 C)  TempSrc:    Rectal  SpO2: 100% 99% 100%     Constitutional: NAD, calm, comfortable Vitals:   09/07/19 2008 09/07/19 2015 09/07/19 2045 09/07/19 2259  BP: (!) 154/107 (!) 152/110 (!) 117/106   Pulse: 100 (!) 101 (!) 101   Resp: 20 (!) 28 (!) 25   Temp:    99.3 F (37.4 C)  TempSrc:    Rectal  SpO2: 100% 99% 100%    Eyes: PERRL, lids and conjunctivae normal ENMT: Mucous membranes are moist. Posterior pharynx clear of any exudate or lesions.Normal dentition.  Neck: normal, supple, no masses, no thyromegaly Respiratory: clear to auscultation bilaterally, no wheezing, no crackles. Normal respiratory effort. No accessory muscle use.  Cardiovascular: Regular rate and rhythm, no murmurs / rubs / gallops. No extremity edema. 2+ pedal pulses. No carotid bruits.  Abdomen: no tenderness, no masses palpated. No hepatosplenomegaly. Bowel sounds positive. Obese,  Open right abdomen/thigh- wound with  red wound bed,some serous drainage noted, no odor noted  Musculoskeletal: no clubbing / cyanosis. No joint deformity upper and lower extremities. Good ROM, no contractures. Normal muscle tone.  Skin: right thigh abdomen wound lesions, ulcers. No induration Neurologic: CN 2-12 grossly intact. Sensation intact, DTR normal. Strength 5/5 in all 4.  Psychiatric: Normal judgment and insight. Alert and oriented x 3. Normal mood.    Labs on Admission: I have personally reviewed following labs and imaging studies  CBC: Recent Labs  Lab 09/05/19 2141 09/07/19 1636  WBC 9.0 10.8*  NEUTROABS  --  8.5*  HGB 11.1* 11.1*  HCT 36.4 36.4  MCV 89.9 90.8  PLT 483* 525*   Basic Metabolic Panel: Recent Labs  Lab 09/05/19 2141 09/07/19 1636  NA 137 137  K 3.0* 3.1*  CL 103 106  CO2 24 21*  GLUCOSE 107* 102*  BUN <5* <5*  CREATININE 0.84 1.02*    CALCIUM 7.9* 7.8*   GFR: Estimated Creatinine Clearance: 90.4 mL/min (A) (by C-G formula based on SCr of 1.02 mg/dL (H)). Liver Function Tests: Recent Labs  Lab 09/05/19 2141 09/07/19 1636  AST 23 18  ALT 12 13  ALKPHOS 145* 153*  BILITOT 0.5 0.4  PROT 6.5 6.0*  ALBUMIN 1.8* 1.6*   No results for input(s): LIPASE, AMYLASE in the last 168 hours. No results for input(s): AMMONIA in the last 168 hours. Coagulation Profile: No results for input(s): INR, PROTIME in the last 168 hours. Cardiac Enzymes: Recent Labs  Lab 09/07/19 1636  CKTOTAL 35*   BNP (last 3 results) No results for input(s): PROBNP in the last 8760 hours. HbA1C: No results for input(s): HGBA1C in the last 72 hours. CBG: No results for input(s): GLUCAP in the last 168 hours. Lipid Profile: No results for input(s): CHOL, HDL, LDLCALC, TRIG, CHOLHDL, LDLDIRECT in the last 72 hours. Thyroid Function Tests: No results for input(s): TSH, T4TOTAL, FREET4, T3FREE, THYROIDAB in the last 72 hours. Anemia Panel: No results for input(s): VITAMINB12, FOLATE, FERRITIN, TIBC, IRON, RETICCTPCT in the last 72 hours. Urine analysis:    Component Value Date/Time   COLORURINE AMBER (A) 09/07/2019 2122   APPEARANCEUR CLOUDY (A) 09/07/2019 2122   LABSPEC 1.029 09/07/2019 2122   PHURINE 5.0 09/07/2019 2122   GLUCOSEU NEGATIVE 09/07/2019 2122   HGBUR LARGE (A) 09/07/2019 2122   BILIRUBINUR NEGATIVE 09/07/2019 2122   KETONESUR NEGATIVE 09/07/2019 2122   PROTEINUR 100 (A) 09/07/2019 2122   NITRITE NEGATIVE 09/07/2019 2122   LEUKOCYTESUR MODERATE (A) 09/07/2019 2122    Radiological Exams on Admission: DG Chest 2 View  Result Date: 09/07/2019 CLINICAL DATA:  Infection EXAM: CHEST - 2 VIEW COMPARISON:  06/23/2019 07/08/2019 FINDINGS: The heart size and mediastinal contours are within normal limits. Both lungs are clear. The visualized skeletal structures are unremarkable. IMPRESSION: No active cardiopulmonary disease.  Electronically Signed   By: Katherine Mantle M.D.   On: 09/07/2019 17:09   CT ABDOMEN PELVIS W CONTRAST  Result Date: 09/07/2019 CLINICAL DATA:  Drainage from lower abdominal/groin wound. Assess for deeper infection. EXAM: CT ABDOMEN AND PELVIS WITH CONTRAST TECHNIQUE: Multidetector CT imaging of the abdomen and pelvis was performed using the standard protocol following bolus administration of intravenous contrast. CONTRAST:  OMNIPAQUE IOHEXOL 300 MG/ML  SOLN COMPARISON:  09/05/2019 FINDINGS: Lower chest: Lung bases are clear. No effusions. Heart is normal size. Hepatobiliary: Diffuse fatty infiltration of the liver. No focal abnormality. 2.3 cm gallstone within the gallbladder. Pancreas: No focal abnormality or ductal  dilatation. Spleen: No focal abnormality.  Normal size. Adrenals/Urinary Tract: No adrenal abnormality. No focal renal abnormality. No stones or hydronephrosis. Urinary bladder is unremarkable. Stomach/Bowel: Normal appendix. Stomach, large and small bowel grossly unremarkable. Vascular/Lymphatic: No evidence of aneurysm or adenopathy. Reproductive: Enlarged uterus with multiple fibroids. No adnexal masses. Other: No free fluid or free air. Musculoskeletal: Again noted is skin thickening along the anterior right groin region and upper thigh. Soft tissue extends from the thickened skin to the adductor longus muscle, similar to prior study. No drainable fluid collection. No change since prior study. No acute bony abnormality. IMPRESSION: Continued skin thickening in the right groin region extending into the anterior thigh. The soft tissue extends deep to the adductor longus muscle and is stable since prior study. No drainable focal fluid collection. It is unclear how much of this reflects postsurgical changes/scarring versus infection. Enlarged fibroid uterus. Diffuse fatty infiltration of the liver. Cholelithiasis. Electronically Signed   By: Rolm Baptise M.D.   On: 09/07/2019 22:32   DG  Knee Complete 4 Views Left  Result Date: 09/07/2019 CLINICAL DATA:  Pain EXAM: LEFT KNEE - COMPLETE 4+ VIEW COMPARISON:  None. FINDINGS: There is no acute displaced fracture. There is no dislocation. Mild tricompartmental degenerative changes are noted. IMPRESSION: No acute displaced fracture or dislocation. Mild tricompartmental degenerative changes. Electronically Signed   By: Constance Holster M.D.   On: 09/07/2019 19:55   DG Knee Complete 4 Views Right  Result Date: 09/07/2019 CLINICAL DATA:  Pain status post fall EXAM: RIGHT KNEE - COMPLETE 4+ VIEW COMPARISON:  None. FINDINGS: Tricompartmental degenerative changes are noted. There is no acute displaced fracture. There is no dislocation. There is nonspecific soft tissue swelling about the lower extremity. IMPRESSION: No acute displaced fracture or dislocation. Tricompartmental degenerative changes are noted. Electronically Signed   By: Constance Holster M.D.   On: 09/07/2019 19:51    EKG: N/  Assessment/Plan Complicated soft tissue infection  Of debrided wound area s/p treatment for necrotizing fascitis   -ct scan abd/pelvis: negative for signs of necrotizing fascitis,however can r/o Complicated soft tissue infection  -broad spectrum antibotics  - f/u on Plastic surgery recs in am  - has planned skin graft July 6th   Recent fall with knee trauma  -trauma work up negative of fx  -supportive care  - PT/OT to see   DMII -place on in house protocol  -current glucose 102  Schizophrenia/bipolar -resume home medications   Muscle stiffness -possible due to electrolyte abnormalities -check mag/phos -patient on haldol ? Related side-effect  -patient CK wnl  -PT/OT to see -per patient recently started haldol one month ago  -if stiffness per persist consider starting cogentin for EPS -consider psych evaluation to assist with treatment  Diarrhea  -check stool pcr  Severe protein cal-malnutriton  -nutrition to see    FEN Hypokalemia replete lytes prn    DVT prophylaxis:heparin  Code Status: full Family Communication:  N/a Disposition Plan: 4-5 days  Consults called: Dr Claudia Desanctis Lauree Chandler surgery  Admission status: med tele   Clance Boll MD [facility]  If 7PM-7AM, please contact night-coverage [email or web address] Password [facility]  09/07/2019, 11:35 PM

## 2019-09-07 NOTE — ED Notes (Signed)
To x-ray

## 2019-09-08 DIAGNOSIS — A0472 Enterocolitis due to Clostridium difficile, not specified as recurrent: Secondary | ICD-10-CM

## 2019-09-08 DIAGNOSIS — S31104D Unspecified open wound of abdominal wall, left lower quadrant without penetration into peritoneal cavity, subsequent encounter: Secondary | ICD-10-CM | POA: Diagnosis not present

## 2019-09-08 DIAGNOSIS — Z79899 Other long term (current) drug therapy: Secondary | ICD-10-CM | POA: Diagnosis not present

## 2019-09-08 DIAGNOSIS — F319 Bipolar disorder, unspecified: Secondary | ICD-10-CM | POA: Diagnosis present

## 2019-09-08 DIAGNOSIS — E669 Obesity, unspecified: Secondary | ICD-10-CM

## 2019-09-08 DIAGNOSIS — W1830XA Fall on same level, unspecified, initial encounter: Secondary | ICD-10-CM | POA: Diagnosis present

## 2019-09-08 DIAGNOSIS — S71101D Unspecified open wound, right thigh, subsequent encounter: Secondary | ICD-10-CM | POA: Diagnosis not present

## 2019-09-08 DIAGNOSIS — E1169 Type 2 diabetes mellitus with other specified complication: Secondary | ICD-10-CM

## 2019-09-08 DIAGNOSIS — T148XXA Other injury of unspecified body region, initial encounter: Secondary | ICD-10-CM | POA: Diagnosis present

## 2019-09-08 DIAGNOSIS — F209 Schizophrenia, unspecified: Secondary | ICD-10-CM | POA: Diagnosis present

## 2019-09-08 DIAGNOSIS — Z20822 Contact with and (suspected) exposure to covid-19: Secondary | ICD-10-CM | POA: Diagnosis present

## 2019-09-08 DIAGNOSIS — Z6833 Body mass index (BMI) 33.0-33.9, adult: Secondary | ICD-10-CM | POA: Diagnosis not present

## 2019-09-08 DIAGNOSIS — E1165 Type 2 diabetes mellitus with hyperglycemia: Secondary | ICD-10-CM | POA: Diagnosis present

## 2019-09-08 DIAGNOSIS — F200 Paranoid schizophrenia: Secondary | ICD-10-CM

## 2019-09-08 DIAGNOSIS — L89302 Pressure ulcer of unspecified buttock, stage 2: Secondary | ICD-10-CM | POA: Diagnosis present

## 2019-09-08 DIAGNOSIS — A072 Cryptosporidiosis: Secondary | ICD-10-CM | POA: Diagnosis present

## 2019-09-08 DIAGNOSIS — L8990 Pressure ulcer of unspecified site, unspecified stage: Secondary | ICD-10-CM | POA: Insufficient documentation

## 2019-09-08 DIAGNOSIS — F312 Bipolar disorder, current episode manic severe with psychotic features: Secondary | ICD-10-CM

## 2019-09-08 DIAGNOSIS — E876 Hypokalemia: Secondary | ICD-10-CM | POA: Diagnosis present

## 2019-09-08 DIAGNOSIS — F17210 Nicotine dependence, cigarettes, uncomplicated: Secondary | ICD-10-CM | POA: Diagnosis present

## 2019-09-08 DIAGNOSIS — X58XXXD Exposure to other specified factors, subsequent encounter: Secondary | ICD-10-CM | POA: Diagnosis present

## 2019-09-08 DIAGNOSIS — Z7984 Long term (current) use of oral hypoglycemic drugs: Secondary | ICD-10-CM | POA: Diagnosis not present

## 2019-09-08 LAB — LACTIC ACID, PLASMA: Lactic Acid, Venous: 0.8 mmol/L (ref 0.5–1.9)

## 2019-09-08 LAB — GASTROINTESTINAL PANEL BY PCR, STOOL (REPLACES STOOL CULTURE)

## 2019-09-08 LAB — COMPREHENSIVE METABOLIC PANEL
ALT: 11 U/L (ref 0–44)
AST: 16 U/L (ref 15–41)
Albumin: 1.5 g/dL — ABNORMAL LOW (ref 3.5–5.0)
Alkaline Phosphatase: 137 U/L — ABNORMAL HIGH (ref 38–126)
Anion gap: 9 (ref 5–15)
BUN: <5 mg/dL — ABNORMAL LOW (ref 6–20)
CO2: 21 mmol/L — ABNORMAL LOW (ref 22–32)
Calcium: 7.5 mg/dL — ABNORMAL LOW (ref 8.9–10.3)
Chloride: 110 mmol/L (ref 98–111)
Creatinine, Ser: 0.77 mg/dL (ref 0.44–1.00)
GFR calc Af Amer: >60 mL/min (ref >60)
GFR calc non Af Amer: >60 mL/min (ref >60)
Glucose, Bld: 80 mg/dL (ref 70–99)
Potassium: 3.5 mmol/L (ref 3.5–5.1)
Sodium: 140 mmol/L (ref 135–145)
Total Bilirubin: 0.4 mg/dL (ref 0.3–1.2)
Total Protein: 5.5 g/dL — ABNORMAL LOW (ref 6.5–8.1)

## 2019-09-08 LAB — CREATININE, SERUM
Creatinine, Ser: 0.86 mg/dL (ref 0.44–1.00)
GFR calc Af Amer: >60 mL/min (ref >60)
GFR calc non Af Amer: >60 mL/min (ref >60)

## 2019-09-08 LAB — CBC
HCT: 31.5 % — ABNORMAL LOW (ref 36.0–46.0)
HCT: 31.3 % — ABNORMAL LOW (ref 36.0–46.0)
Hemoglobin: 9.6 g/dL — ABNORMAL LOW (ref 12.0–15.0)
Hemoglobin: 9.7 g/dL — ABNORMAL LOW (ref 12.0–15.0)
MCH: 27.8 pg (ref 26.0–34.0)
MCH: 28 pg (ref 26.0–34.0)
MCHC: 30.5 g/dL (ref 30.0–36.0)
MCHC: 31 g/dL (ref 30.0–36.0)
MCV: 91.3 fL (ref 80.0–100.0)
MCV: 90.5 fL (ref 80.0–100.0)
Platelets: 390 10*3/uL (ref 150–400)
Platelets: 426 10*3/uL — ABNORMAL HIGH (ref 150–400)
RBC: 3.45 MIL/uL — ABNORMAL LOW (ref 3.87–5.11)
RBC: 3.46 MIL/uL — ABNORMAL LOW (ref 3.87–5.11)
RDW: 18.6 % — ABNORMAL HIGH (ref 11.5–15.5)
RDW: 17.4 % — ABNORMAL HIGH (ref 11.5–15.5)
WBC: 7 10*3/uL (ref 4.0–10.5)
WBC: 6.3 10*3/uL (ref 4.0–10.5)
nRBC: 0 % (ref 0.0–0.2)
nRBC: 0 % (ref 0.0–0.2)

## 2019-09-08 LAB — PHOSPHORUS: Phosphorus: 4.4 mg/dL (ref 2.5–4.6)

## 2019-09-08 LAB — PROTIME-INR
INR: 1.3 — ABNORMAL HIGH (ref 0.8–1.2)
Prothrombin Time: 15.9 seconds — ABNORMAL HIGH (ref 11.4–15.2)

## 2019-09-08 LAB — GLUCOSE, CAPILLARY
Glucose-Capillary: 82 mg/dL (ref 70–99)
Glucose-Capillary: 101 mg/dL — ABNORMAL HIGH (ref 70–99)
Glucose-Capillary: 100 mg/dL — ABNORMAL HIGH (ref 70–99)
Glucose-Capillary: 92 mg/dL (ref 70–99)

## 2019-09-08 LAB — C DIFFICILE QUICK SCREEN W PCR REFLEX
C Diff antigen: POSITIVE — AB
C Diff toxin: NEGATIVE

## 2019-09-08 LAB — HEMOGLOBIN A1C
Hgb A1c MFr Bld: 4.9 % (ref 4.8–5.6)
Mean Plasma Glucose: 93.93 mg/dL

## 2019-09-08 LAB — CBG MONITORING, ED: Glucose-Capillary: 74 mg/dL (ref 70–99)

## 2019-09-08 LAB — TSH: TSH: 1.504 u[IU]/mL (ref 0.350–4.500)

## 2019-09-08 LAB — CLOSTRIDIUM DIFFICILE BY PCR, REFLEXED: Toxigenic C. Difficile by PCR: POSITIVE — AB

## 2019-09-08 LAB — SARS CORONAVIRUS 2 BY RT PCR (HOSPITAL ORDER, PERFORMED IN ~~LOC~~ HOSPITAL LAB): SARS Coronavirus 2: NEGATIVE

## 2019-09-08 LAB — MAGNESIUM: Magnesium: 1.5 mg/dL — ABNORMAL LOW (ref 1.7–2.4)

## 2019-09-08 MED ORDER — VANCOMYCIN 50 MG/ML ORAL SOLUTION
125.0000 mg | Freq: Four times a day (QID) | ORAL | Status: DC
  Administered 2019-09-08 – 2019-09-15 (×28): 125 mg via ORAL
  Filled 2019-09-08 (×36): qty 2.5

## 2019-09-08 MED ORDER — POTASSIUM CHLORIDE 20 MEQ PO PACK
40.0000 meq | PACK | Freq: Two times a day (BID) | ORAL | Status: DC
  Administered 2019-09-08 – 2019-09-15 (×16): 40 meq via ORAL
  Filled 2019-09-08 (×17): qty 2

## 2019-09-08 MED ORDER — PIPERACILLIN-TAZOBACTAM 3.375 G IVPB
3.3750 g | Freq: Three times a day (TID) | INTRAVENOUS | Status: DC
  Administered 2019-09-08 (×2): 3.375 g via INTRAVENOUS
  Filled 2019-09-08 (×2): qty 50

## 2019-09-08 MED ORDER — MAGNESIUM SULFATE 2 GM/50ML IV SOLN
2.0000 g | Freq: Once | INTRAVENOUS | Status: AC
  Administered 2019-09-08: 2 g via INTRAVENOUS
  Filled 2019-09-08: qty 50

## 2019-09-08 MED ORDER — HALOPERIDOL 5 MG PO TABS
20.0000 mg | ORAL_TABLET | Freq: Every day | ORAL | Status: DC
  Administered 2019-09-08 – 2019-09-14 (×7): 20 mg via ORAL
  Filled 2019-09-08 (×8): qty 4

## 2019-09-08 MED ORDER — SODIUM CHLORIDE 0.9% FLUSH
3.0000 mL | Freq: Two times a day (BID) | INTRAVENOUS | Status: DC
  Administered 2019-09-08 – 2019-09-14 (×11): 3 mL via INTRAVENOUS

## 2019-09-08 MED ORDER — HALOPERIDOL 5 MG PO TABS
10.0000 mg | ORAL_TABLET | Freq: Every morning | ORAL | Status: DC
  Administered 2019-09-08 – 2019-09-15 (×8): 10 mg via ORAL
  Filled 2019-09-08 (×8): qty 2

## 2019-09-08 MED ORDER — SODIUM CHLORIDE 0.9 % IV SOLN: INTRAVENOUS | Status: AC

## 2019-09-08 MED ORDER — INSULIN ASPART 100 UNIT/ML ~~LOC~~ SOLN: 0.0000 [IU] | Freq: Three times a day (TID) | SUBCUTANEOUS | Status: DC

## 2019-09-08 MED ORDER — HEPARIN SODIUM (PORCINE) 5000 UNIT/ML IJ SOLN
5000.0000 [IU] | Freq: Three times a day (TID) | INTRAMUSCULAR | Status: DC
  Administered 2019-09-08 – 2019-09-14 (×19): 5000 [IU] via SUBCUTANEOUS
  Filled 2019-09-08 (×19): qty 1

## 2019-09-08 MED ORDER — ALBUTEROL SULFATE (2.5 MG/3ML) 0.083% IN NEBU: 2.5000 mg | INHALATION_SOLUTION | RESPIRATORY_TRACT | Status: DC | PRN

## 2019-09-08 NOTE — Consult Note (Signed)
WOC Nurse wound consult note Consultation was completed by review of records, images and assistance from the bedside nurse/clinical staff.    Reason for Consult: wound infection Wound type: surgical; post debridement per plastics Pressure Injury POA:NA Measurement: see nursing flow sheets Wound bed:100% pink and clean Drainage (amount, consistency, odor) unable to assess;  No dressing in image Periwound: intact Dressing procedure/placement/frequency: Saline moist dressings until patient can be evaluated by plastic surgery   Re consult if needed, will not follow at this time. Thanks  Javionna Leder [employer], RN,CWOCN, CNS, CWON-AP [phone])

## 2019-09-08 NOTE — Evaluation (Signed)
Physical Therapy Evaluation Patient Details Name: Kimberly Orozco MRN: [id number] DOB: 04-26-1975 Today's Date: 09/08/2019   History of Present Illness  Kimberly Orozco is a 44 y.o. female with medical history significant of bipolar disorder, diabetes, schizophrenia, recent history of necrotizing fasciitis affecting abdomen and right thigh in  April s/p multiple debridements. Course complicated by right abdominal wound dehiscence x2. Patient currently is followed by Dr Arita Miss and has plans for skin graft to assist in wound healing on July 6. Patient now presents to ed s/p fall and on evaluation found to have wound infection.  Clinical Impression  Patient received with OT in room, agreeable to PT. Patient requires min +2 assist for supine to sit, min A for sit to stand, ambulates without ad with min guard 30 feet. She has no LOB. Able to transfer sit to stand from recliner with stand by assist. Patient will continue to benefit from skilled PT while here to improve strength for safe return home.       Follow Up Recommendations No PT follow up    Equipment Recommendations  None recommended by PT    Recommendations for Other Services       Precautions / Restrictions Precautions Precautions: Fall Restrictions Weight Bearing Restrictions: No      Mobility  Bed Mobility Overal bed mobility: Needs Assistance Bed Mobility: Supine to Sit     Supine to sit: Min assist;+2 for physical assistance        Transfers Overall transfer level: Needs assistance Equipment used: None Transfers: Sit to/from Stand Sit to Stand: Min guard         General transfer comment: patient able to transfer from recliner with min guard  Ambulation/Gait Ambulation/Gait assistance: Min guard Gait Distance (Feet): 30 Feet Assistive device: None Gait Pattern/deviations: Step-through pattern Gait velocity: decr   General Gait Details: safe ambulation in room, no unsteadiness noted.  Stairs             Wheelchair Mobility    Modified Rankin (Stroke Patients Only)       Balance Overall balance assessment: Modified Independent                                           Pertinent Vitals/Pain Pain Assessment: No/denies pain    Home Living Family/patient expects to be discharged to:: Private residence Living Arrangements: Parent Available Help at Discharge: Family;Available 24 hours/day Type of Home: House Home Access: Stairs to enter Entrance Stairs-Rails: None Entrance Stairs-Number of Steps: 2+3 steps Home Layout: One level Home Equipment: None Additional Comments: section 8 housing with her mother    Prior Function Level of Independence: Independent         Comments: mother helps with haircare; ottherwise, pt is independent     Hand Dominance   Dominant Hand: Right    Extremity/Trunk Assessment   Upper Extremity Assessment Upper Extremity Assessment: Generalized weakness    Lower Extremity Assessment Lower Extremity Assessment: Generalized weakness    Cervical / Trunk Assessment Cervical / Trunk Assessment: Normal  Communication   Communication: Expressive difficulties;Other (comment)  Cognition Arousal/Alertness: Awake/alert Behavior During Therapy: Flat affect Overall Cognitive Status: Within Functional Limits for tasks assessed                                 General Comments: Patient is  pleasant, agreeable, cooperative      General Comments      Exercises     Assessment/Plan    PT Assessment Patient needs continued PT services  PT Problem List Decreased strength;Decreased mobility       PT Treatment Interventions Gait training;Functional mobility training;Therapeutic activities;Therapeutic exercise;Patient/family education    PT Goals (Current goals can be found in the Care Plan section)  Acute Rehab PT Goals Patient Stated Goal: none stated Time For Goal Achievement: 09/15/19    Frequency Min  3X/week   Barriers to discharge        Co-evaluation               AM-PAC PT "6 Clicks" Mobility  Outcome Measure Help needed turning from your back to your side while in a flat bed without using bedrails?: A Little Help needed moving from lying on your back to sitting on the side of a flat bed without using bedrails?: A Little Help needed moving to and from a bed to a chair (including a wheelchair)?: A Little Help needed standing up from a chair using your arms (e.g., wheelchair or bedside chair)?: None Help needed to walk in hospital room?: A Little Help needed climbing 3-5 steps with a railing? : A Little 6 Click Score: 19    End of Session   Activity Tolerance: Patient tolerated treatment well Patient left: in chair;with call bell/phone within reach Nurse Communication: Mobility status PT Visit Diagnosis: Muscle weakness (generalized) (M62.81)    Time: 0569-7948 PT Time Calculation (min) (ACUTE ONLY): 17 min   Charges:   PT Evaluation $PT Eval Moderate Complexity: 1 Mod PT Treatments $Gait Training: 8-22 mins        Hanadi Stanly, PT, GCS 09/08/19,12:41 PM

## 2019-09-08 NOTE — Progress Notes (Signed)
PROGRESS NOTE    Kimberly Orozco  LFY:[id number] DOB: Aug 18, 1975 DOA: 09/07/2019 PCP: Patient, No Pcp Per    Brief Narrative:  Patient admitted to the hospital with a working diagnosis of complicated soft tissue infection.  44 year old female with significant past medical history for bipolar disorder, type 2 diabetes mellitus, and schizophrenia.  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  It was complicated by wound dehiscence x2.  Apparently patient had difficulty doing wound changes at home.  She was planned to get a skin graft on July 6.  Patient sustained a mechanical fall and was brought to the hospital, she reported having diarrhea for the last 3 days, having generalized malaise and body stiffness.  On her initial physical examination blood pressure 154/107, heart rate 101, respiratory rate 28, temperature 99.3, oxygen saturation 99%, lungs are clear to auscultation bilaterally, heart S1-S2, present rhythmic, soft abdomen, protuberant.  Open abdominal- right thigh wound, serosanguineous drainage no odor or pus. Sodium 137, potassium 3.1, chloride 106, bicarb 21, glucose 102, BUN less than 5, creatinine 1.0, white count 10.8, hemoglobin 11.1, hematocrit 36.4, platelets 525.  SARS COVID-19 negative.  Urinalysis more than 50 white cells, 21-50 red cells, 21-50 epithelial cells, specific gravity 1.029.  Serum testing positive antigen, negative toxin, positive PCR.  Chest radiograph with no infiltrates. CT of the abdomen and pelvis with skin thickening in the right groin, extending into the anterior thigh.  Soft tissue extends deep to the abductor longus muscle.  No drainable collection.  Assessment & Plan:   Principal Problem:   C. difficile diarrhea Active Problems:   Schizophrenia (HCC)   Bipolar disorder (HCC)   Diabetes mellitus type 2 in obese (HCC)   Wound infection   Pressure injury of skin   1.  Acute C diff diarrhea, present on admission. Patient with diarrhea for the last 3 days, progressive and severe. Stool positive for C diff antigen and PCR, negative for toxin.  Will start patient on oral vancomycin and will follow up on stool output and cell count. Continue to encourage po intake and will consult nutrition, PT and OT evaluation.   2. Large right thigh wound, At the time of my examination dressing in place, I reviewed the pictures and no signs of infection, CT with no deep collection.   I spoke with plastic surgery, and no need for IV antibiotics for right groin wound.  Will follow with left groin wound for now.   3. T2DM. Will continue glucose cover and monitoring with insulin sliding scale.   4. Hypokalemia/ hypomagensemia. Likely due to GI losses, will continue K correction with Kcl 40 meq x2 and 2 g Mag sulfate IV.  Renal function with serum cr at 0,7 and serum bicarbonate at 21.   5. Bipolar and schizophrenia. Will resume patient's haldol 10 mg in am and 20 mg in pm.   6. Stage 2 buttock ulcer. Present on admission, continue with local wound care.   Patient continue to be at high risk for worsening c diff infection.   Status is: Inpatient  Remains inpatient appropriate because:Inpatient level of care appropriate due to severity of illness   Dispo: The patient is from: Home              Anticipated d/c is to: Home              Anticipated d/c date is: 3 days  Patient currently is not medically stable to d/c.   DVT prophylaxis: Enoxaparin   Code Status:   full  Family Communication:  No family at the bedside      Nutrition Status:           Skin Documentation: Pressure Injury 09/08/19 Buttocks Stage 2 -  Partial thickness loss of dermis presenting as a shallow open injury with a red, pink wound bed without slough. (Active)  09/08/19 0604  Location: Buttocks  Location Orientation:   Staging: Stage 2 -  Partial thickness loss of dermis  presenting as a shallow open injury with a red, pink wound bed without slough.  Wound Description (Comments):   Present on Admission: Yes     Consultants:   Plastic surgery    Antimicrobials:   Oral vancomycin     Subjective: Patient with no nausea or vomiting, but continue with profuse diarrhea, no chest pain. Very weak and deconditioned.   Objective: Vitals:   09/08/19 0200 09/08/19 0413 09/08/19 0502 09/08/19 0952  BP: 125/84 137/89 (!) 121/97 132/87  Pulse: 83 91 65 89  Resp: 12 17 17 19   Temp:  98.1 F (36.7 C) 98.7 F (37.1 C) 97.9 F (36.6 C)  TempSrc:  Oral Oral Oral  SpO2: 100% 100% 100% 100%  Weight:  98.3 kg      Intake/Output Summary (Last 24 hours) at 09/08/2019 1304 Last data filed at 09/08/2019 09/10/2019 Gross per 24 hour  Intake 240 ml  Output --  Net 240 ml   Filed Weights   09/08/19 0413  Weight: 98.3 kg    Examination:   General: Not in pain or dyspnea, deconditioned  Neurology: Awake and alert, non focal  E ENT: no pallor, no icterus, oral mucosa moist Cardiovascular: No JVD. S1-S2 present, rhythmic, no gallops, rubs, or murmurs. No lower extremity edema. Pulmonary positive breath sounds bilaterally, adequate air movement, no wheezing, rhonchi or rales. Gastrointestinal. Abdomen with , no organomegaly, non tender, no rebound or guarding Skin. Right thigh with dressing in place.  Musculoskeletal: no joint deformities     Data Reviewed: I have personally reviewed following labs and imaging studies  CBC: Recent Labs  Lab 09/05/19 2141 09/07/19 1636 09/08/19 0025 09/08/19 0614  WBC 9.0 10.8* 7.0 6.3  NEUTROABS  --  8.5*  --   --   HGB 11.1* 11.1* 9.6* 9.7*  HCT 36.4 36.4 31.5* 31.3*  MCV 89.9 90.8 91.3 90.5  PLT 483* 525* 390 426*   Basic Metabolic Panel: Recent Labs  Lab 09/05/19 2141 09/07/19 1636 09/08/19 0025 09/08/19 0614  NA 137 137  --  140  K 3.0* 3.1*  --  3.5  CL 103 106  --  110  CO2 24 21*  --  21*  GLUCOSE  107* 102*  --  80  BUN <5* <5*  --  <5*  CREATININE 0.84 1.02* 0.86 0.77  CALCIUM 7.9* 7.8*  --  7.5*  MG  --   --   --  1.5*  PHOS  --   --   --  4.4   GFR: Estimated Creatinine Clearance: 108.1 mL/min (by C-G formula based on SCr of 0.77 mg/dL). Liver Function Tests: Recent Labs  Lab 09/05/19 2141 09/07/19 1636 09/08/19 0614  AST 23 18 16   ALT 12 13 11   ALKPHOS 145* 153* 137*  BILITOT 0.5 0.4 0.4  PROT 6.5 6.0* 5.5*  ALBUMIN 1.8* 1.6* 1.5*   No results for input(s): LIPASE, AMYLASE in the  last 168 hours. No results for input(s): AMMONIA in the last 168 hours. Coagulation Profile: Recent Labs  Lab 09/08/19 0614  INR 1.3*   Cardiac Enzymes: Recent Labs  Lab 09/07/19 1636  CKTOTAL 35*   BNP (last 3 results) No results for input(s): PROBNP in the last 8760 hours. HbA1C: Recent Labs    09/08/19 0039  HGBA1C 4.9   CBG: Recent Labs  Lab 09/08/19 0049 09/08/19 0806 09/08/19 1219  GLUCAP 74 82 101*   Lipid Profile: No results for input(s): CHOL, HDL, LDLCALC, TRIG, CHOLHDL, LDLDIRECT in the last 72 hours. Thyroid Function Tests: Recent Labs    09/08/19 0033  TSH 1.504   Anemia Panel: No results for input(s): VITAMINB12, FOLATE, FERRITIN, TIBC, IRON, RETICCTPCT in the last 72 hours.    Radiology Studies: I have reviewed all of the imaging during this hospital visit personally     Scheduled Meds: . heparin  5,000 Units Subcutaneous Q8H  . insulin aspart  0-9 Units Subcutaneous TID WC  . potassium chloride  40 mEq Oral BID  . sodium chloride flush  3 mL Intravenous Once  . sodium chloride flush  3 mL Intravenous Q12H   Continuous Infusions: . sodium chloride 75 mL/hr at 09/08/19 0113  . piperacillin-tazobactam (ZOSYN)  IV 3.375 g (09/08/19 1232)  . vancomycin 750 mg (09/08/19 0841)     LOS: 0 days        Jozie Wulf Gerome Apley, MD

## 2019-09-08 NOTE — Evaluation (Addendum)
Occupational Therapy Evaluation Patient Details Name: Kimberly Orozco MRN: [id number] DOB: February 14, 1976 Today's Date: 09/08/2019    History of Present Illness Kimberly Orozco is a 44 y.o. female with medical history significant of bipolar disorder, diabetes, schizophrenia, recent history of necrotizing fasciitis affecting abdomen and right thigh in  April s/p multiple debridements. Course complicated by right abdominal wound dehiscence x2. Patient currently is followed by Dr Arita Miss and has plans for skin graft to assist in wound healing on July 6. Patient now presents to ed s/p fall and on evaluation found to have wound infection.   Clinical Impression   Pt PTA: Pt living with mother and pt reports independence prior. Pt currently performing ADL functional mobility and ADL tasks with supervisionA to minA overall. Pt standing at sink for ADL with supervisionA. Pt able to reach BLEs with increased time. Pt following all commands with psychomotor retardation - taking increased time for all functional tasks. Pt does not require continued OT skilled services as pt appears at functional baseline. Pt able to perform ADL and mobility with supervisionA to modified independence. OT signing off.      Follow Up Recommendations  No OT follow up;Supervision - Intermittent    Equipment Recommendations  None recommended by OT    Recommendations for Other Services       Precautions / Restrictions Precautions Precautions: Fall Restrictions Weight Bearing Restrictions: No      Mobility Bed Mobility Overal bed mobility: Needs Assistance Bed Mobility: Supine to Sit     Supine to sit: Min assist;+2 for physical assistance     General bed mobility comments: Assist for trunk elevation  Transfers Overall transfer level: Needs assistance Equipment used: None Transfers: Sit to/from Stand Sit to Stand: Min guard         General transfer comment: patient able to transfer from recliner with min  guard    Balance Overall balance assessment: Modified Independent                                         ADL either performed or assessed with clinical judgement   ADL Overall ADL's : At baseline                                       General ADL Comments: Pt able to perform bed mobility to EOB; transfers and standing at sink for ADL tasks; pt encouraged to perform several grooming tasks as pt moving slow (psychomotor retardation). Pt transferring to and from recliner <-> BSC.     Vision Baseline Vision/History: No visual deficits Patient Visual Report: No change from baseline Vision Assessment?: No apparent visual deficits Additional Comments: pt reading sign of clock on wall     Perception     Praxis      Pertinent Vitals/Pain Pain Assessment: No/denies pain     Hand Dominance Right   Extremity/Trunk Assessment Upper Extremity Assessment Upper Extremity Assessment: Generalized weakness   Lower Extremity Assessment Lower Extremity Assessment: Generalized weakness   Cervical / Trunk Assessment Cervical / Trunk Assessment: Normal   Communication Communication Communication: Expressive difficulties;Other (comment)   Cognition Arousal/Alertness: Awake/alert Behavior During Therapy: Flat affect Overall Cognitive Status: Within Functional Limits for tasks assessed  General Comments: Patient is pleasant, agreeable, cooperative   General Comments  O2 on RA    Exercises     Shoulder Instructions      Home Living Family/patient expects to be discharged to:: Private residence Living Arrangements: Parent Available Help at Discharge: Family;Available 24 hours/day Type of Home: House Home Access: Stairs to enter [employer] of Steps: 2+3 steps Entrance Stairs-Rails: None Home Layout: One level     Bathroom Shower/Tub: Teacher, early years/pre: Standard      Home Equipment: None   Additional Comments: section 8 housing with her mother      Prior Functioning/Environment Level of Independence: Independent        Comments: mother helps with haircare; ottherwise, pt is independent        OT Problem List: Pain      OT Treatment/Interventions:      OT Goals(Current goals can be found in the care plan section) Acute Rehab OT Goals Patient Stated Goal: none stated OT Goal Formulation: With patient  OT Frequency:     Barriers to D/C:            Co-evaluation              AM-PAC OT "6 Clicks" Daily Activity     Outcome Measure Help from another person eating meals?: None Help from another person taking care of personal grooming?: None Help from another person toileting, which includes using toliet, bedpan, or urinal?: None Help from another person bathing (including washing, rinsing, drying)?: A Little Help from another person to put on and taking off regular upper body clothing?: None Help from another person to put on and taking off regular lower body clothing?: A Little 6 Click Score: 22   End of Session Equipment Utilized During Treatment: Gait belt Nurse Communication: Mobility status  Activity Tolerance: Patient tolerated treatment well Patient left: in chair;with call bell/phone within reach  OT Visit Diagnosis: Unsteadiness on feet (R26.81);Muscle weakness (generalized) (M62.81);Pain Pain - part of body: Knee (B/L)                Time: 8182-9937 OT Time Calculation (min): 24 min Charges:  OT General Charges $OT Visit: 1 Visit OT Evaluation $OT Eval Moderate Complexity: 1 Mod  Jefferey Pica, OTR/L Acute Rehabilitation Services Pager: [phone] Office: [phone]   Ustin Cruickshank C 09/08/2019, 2:08 PM

## 2019-09-08 NOTE — ED Notes (Signed)
RN attempted to call report, Floor RN is busy per Diplomatic Services operational officer, they will call back when available , RN wait for phone call

## 2019-09-08 NOTE — Consult Note (Addendum)
Reason for Consult: Wound Infection Referring Physician: Dr. Pervis Hocking is an 44 y.o. female.  HPI: Patient is a 44 year old female with a history of diabetes, schizophrenia, bipolar disorder.  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  Right and left abdominal wounds were repaired and meshed prime matrix was applied to the right thigh wound on 07/08/2019.  The right abdominal wound dehisced.  Right abdominal wound was again repaired and meshed prime matrix was applied to the right thigh wound on 07/22/2019.  Right abdominal wound dehisced again.  Patient was assessed in the office on 09/03/2019 in the right leg wound was healing nicely filling in with good granulation tissue.  Left abdominal wound closure was still intact and healing nicely.  She presented to the emergency department yesterday after a fall.  Today she reports all of her muscles feel stiff and she cannot move them very much.  Reports she has had diarrhea for the last few days..  She is unsure if she has had any fevers.  Denies nausea and vomiting, chest pain, shortness of breath.  There is a well demarcated area of erythema of the left abdomin, groin and thigh accompanied by a foul odor. Denies pain in this area. Expected drainage from right thigh/ abdominal wound, no foul odor.            Past Medical History:  Diagnosis Date  . Bipolar disorder (HCC)   . Diabetes mellitus without complication (HCC)   . Schizophrenia [facility])     Past Surgical History:  Procedure Laterality Date  . APPLICATION OF A-CELL OF EXTREMITY N/A 07/08/2019   Procedure: with placement of primatrix AG;  Surgeon: Allena Napoleon, MD;  Location: [facility] OR;  Service: Plastics;  Laterality: N/A;  . APPLICATION OF A-CELL OF EXTREMITY Right 07/22/2019   Procedure: placement of primatrix mesh;  Surgeon: Allena Napoleon, MD;  Location: [facility] OR;  Service: Plastics;  Laterality: Right;  . INCISION AND DRAINAGE  OF WOUND N/A 06/26/2019   Procedure: IRRIGATION AND DEBRIDEMENT ABDOMINAL WALL AND GROIN;  Surgeon: Axel Filler, MD;  Location: [facility] OR;  Service: General;  Laterality: N/A;  . INCISION AND DRAINAGE OF WOUND N/A 07/08/2019   Procedure: Debridement of abdominal and groin wound;  Surgeon: Allena Napoleon, MD;  Location: [facility] OR;  Service: Plastics;  Laterality: N/A;  . INCISION AND DRAINAGE PERIRECTAL ABSCESS Right 06/24/2019   Procedure: IRRIGATION AND DEBRIDEMENT RIGHT GROIN AND RIGHT BUTTOCK;  Surgeon: Axel Filler, MD;  Location: [facility] OR;  Service: General;  Laterality: Right;  . IRRIGATION AND DEBRIDEMENT OF WOUND WITH SPLIT THICKNESS SKIN GRAFT Right 07/22/2019   Procedure: Debridement of right thigh wound;  Surgeon: Allena Napoleon, MD;  Location: [facility] OR;  Service: Plastics;  Laterality: Right;    Family History  Family history unknown: Yes    Social History:  reports that she has been smoking cigarettes. She has never used smokeless tobacco. She reports previous alcohol use. She reports that she does not use drugs.  Allergies: No Known Allergies  Medications: I have reviewed the patient's current medications.  Results for orders placed or performed during the hospital encounter of 09/07/19 (from the past 48 hour(s))  Lactic acid, plasma     Status: None   Collection Time: 09/07/19  4:36 PM  Result Value Ref Range   Lactic Acid, Venous 1.2 0.5 - 1.9 mmol/L    Comment: Performed at [facility], [address] [place], [place] [address]  Comprehensive metabolic panel     Status: Abnormal   Collection Time: 09/07/19  4:36 PM  Result Value Ref Range   Sodium 137 135 - 145 mmol/L   Potassium 3.1 (L) 3.5 - 5.1 mmol/L   Chloride 106 98 - 111 mmol/L   CO2 21 (L) 22 - 32 mmol/L   Glucose, Bld 102 (H) 70 - 99 mg/dL    Comment: Glucose reference range applies only to samples taken after fasting for at least 8 hours.   BUN <5 (L) 6 - 20 mg/dL   Creatinine, Ser 1.02 (H) 0.44 - 1.00  mg/dL   Calcium 7.8 (L) 8.9 - 10.3 mg/dL   Total Protein 6.0 (L) 6.5 - 8.1 g/dL   Albumin 1.6 (L) 3.5 - 5.0 g/dL   AST 18 15 - 41 U/L   ALT 13 0 - 44 U/L   Alkaline Phosphatase 153 (H) 38 - 126 U/L   Total Bilirubin 0.4 0.3 - 1.2 mg/dL   GFR calc non Af Amer >60 >60 mL/min   GFR calc Af Amer >60 >60 mL/min   Anion gap 10 5 - 15    Comment: Performed at [facility], [place] [address], [place], [place] [address]  CBC with Differential     Status: Abnormal   Collection Time: 09/07/19  4:36 PM  Result Value Ref Range   WBC 10.8 (H) 4.0 - 10.5 K/uL   RBC 4.01 3.87 - 5.11 MIL/uL   Hemoglobin 11.1 (L) 12.0 - 15.0 g/dL   HCT 36.4 36 - 46 %   MCV 90.8 80.0 - 100.0 fL   MCH 27.7 26.0 - 34.0 pg   MCHC 30.5 30.0 - 36.0 g/dL   RDW 17.2 (H) 11.5 - 15.5 %   Platelets 525 (H) 150 - 400 K/uL   nRBC 0.0 0.0 - 0.2 %   Neutrophils Relative % 79 %   Neutro Abs 8.5 (H) 1.7 - 7.7 K/uL   Lymphocytes Relative 12 %   Lymphs Abs 1.3 0.7 - 4.0 K/uL   Monocytes Relative 8 %   Monocytes Absolute 0.9 0 - 1 K/uL   Eosinophils Relative 1 %   Eosinophils Absolute 0.1 0 - 0 K/uL   Basophils Relative 0 %   Basophils Absolute 0.0 0 - 0 K/uL   Immature Granulocytes 0 %   Abs Immature Granulocytes 0.03 0.00 - 0.07 K/uL    Comment: Performed at [facility], [place] [address] [place], [place] [address]  CK     Status: Abnormal   Collection Time: 09/07/19  4:36 PM  Result Value Ref Range   Total CK 35 (L) 38.0 - 234.0 U/L    Comment: Performed at [facility], [place] [address], [place], [place] [address]  I-Stat beta hCG blood, ED     Status: None   Collection Time: 09/07/19  5:02 PM  Result Value Ref Range   I-stat hCG, quantitative <5.0 <5 mIU/mL   Comment 3            Comment:   GEST. AGE      CONC.  (mIU/mL)   <=1 WEEK        5 - 50     2 WEEKS       50 - 500     3 WEEKS       100 - 10,000     4 WEEKS     1,000 -  30,000        FEMALE AND NON-PREGNANT FEMALE:     LESS THAN 5 mIU/mL     Urinalysis, Routine w reflex microscopic     Status: Abnormal   Collection Time: 09/07/19  9:22 PM  Result Value Ref Range   Color, Urine AMBER (A) YELLOW    Comment: BIOCHEMICALS MAY BE AFFECTED BY COLOR   APPearance CLOUDY (A) CLEAR   Specific Gravity, Urine 1.029 1.005 - 1.030   pH 5.0 5.0 - 8.0   Glucose, UA NEGATIVE NEGATIVE mg/dL   Hgb urine dipstick LARGE (A) NEGATIVE   Bilirubin Urine NEGATIVE NEGATIVE   Ketones, ur NEGATIVE NEGATIVE mg/dL   Protein, ur 161 (A) NEGATIVE mg/dL   Nitrite NEGATIVE NEGATIVE   Leukocytes,Ua MODERATE (A) NEGATIVE   RBC / HPF 21-50 0 - 5 RBC/hpf   WBC, UA >50 (H) 0 - 5 WBC/hpf   Bacteria, UA MANY (A) NONE SEEN   Squamous Epithelial / LPF 21-50 0 - 5   Mucus PRESENT    Budding Yeast PRESENT    Hyaline Casts, UA PRESENT     Comment: Performed at [facility], [address], [place], [place] [address]  SARS Coronavirus 2 by RT PCR (hospital order, performed in [facility] hospital lab) Nasopharyngeal Nasopharyngeal Swab     Status: None   Collection Time: 09/07/19 11:09 PM   Specimen: Nasopharyngeal Swab  Result Value Ref Range   SARS Coronavirus 2 NEGATIVE NEGATIVE    Comment: (NOTE) SARS-CoV-2 target nucleic acids are NOT DETECTED.  The SARS-CoV-2 RNA is generally detectable in upper and lower respiratory specimens during the acute phase of infection. The lowest concentration of SARS-CoV-2 viral copies this assay can detect is 250 copies / mL. A negative result does not preclude SARS-CoV-2 infection and should not be used as the sole basis for treatment or other patient management decisions.  A negative result may occur with improper specimen collection / handling, submission of specimen other than nasopharyngeal swab, presence of viral mutation(s) within the areas targeted by this assay, and inadequate number of viral copies (<250 copies / mL). A negative result must be combined with clinical observations, patient history, and  epidemiological information.  Fact Sheet for Patients:   BoilerBrush.com.cy  Fact Sheet for Healthcare Providers: https://pope.com/  This test is not yet approved or  cleared by the [place] FDA and has been authorized for detection and/or diagnosis of SARS-CoV-2 by FDA under an Emergency Use Authorization (EUA).  This EUA will remain in effect (meaning this test can be used) for the duration of the COVID-19 declaration under Section 564(b)(1) of the Act, 21 U.S.C. section 360bbb-3(b)(1), unless the authorization is terminated or revoked sooner.  Performed at [facility], [address], [place], [place] [address]   CBC     Status: Abnormal   Collection Time: 09/08/19 12:25 AM  Result Value Ref Range   WBC 7.0 4.0 - 10.5 K/uL   RBC 3.45 (L) 3.87 - 5.11 MIL/uL   Hemoglobin 9.6 (L) 12.0 - 15.0 g/dL   HCT 11.9 (L) 36 - 46 %   MCV 91.3 80.0 - 100.0 fL   MCH 27.8 26.0 - 34.0 pg   MCHC 30.5 30.0 - 36.0 g/dL   RDW 14.7 (H) 82.9 - 56.2 %   Platelets 390 150 - 400 K/uL   nRBC 0.0 0.0 - 0.2 %    Comment: Performed at [facility], [address],  [place], [place] [address]  Creatinine, serum     Status: None   Collection Time: 09/08/19 12:25 AM  Result Value Ref Range   Creatinine, Ser 0.86 0.44 - 1.00 mg/dL   GFR calc non Af Amer >60 >60 mL/min   GFR calc Af Amer >60 >60 mL/min    Comment: Performed at [facility], [address] [place], [place] [address]  TSH     Status: None   Collection Time: 09/08/19 12:33 AM  Result Value Ref Range   TSH 1.504 0.350 - 4.500 uIU/mL    Comment: Performed by a 3rd Generation assay with a functional sensitivity of <=0.01 uIU/mL. Performed at [facility], [address], [place], [place] [address]   Hemoglobin A1c     Status: None   Collection Time: 09/08/19 12:39 AM  Result Value Ref Range   Hgb A1c MFr Bld 4.9 4.8 - 5.6 %    Comment: (NOTE) Pre diabetes:           5.7%-6.4%  Diabetes:              >6.4%  Glycemic control for   <7.0% adults with diabetes    Mean Plasma Glucose 93.93 mg/dL    Comment: Performed at [facility], [address] [place], [place] [address]  CBG monitoring, ED     Status: None   Collection Time: 09/08/19 12:49 AM  Result Value Ref Range   Glucose-Capillary 74 70 - 99 mg/dL    Comment: Glucose reference range applies only to samples taken after fasting for at least 8 hours.  Lactic acid, plasma     Status: None   Collection Time: 09/08/19  6:14 AM  Result Value Ref Range   Lactic Acid, Venous 0.8 0.5 - 1.9 mmol/L    Comment: Performed at [facility], [address], [place], [place] [address]  Comprehensive metabolic panel     Status: Abnormal   Collection Time: 09/08/19  6:14 AM  Result Value Ref Range   Sodium 140 135 - 145 mmol/L   Potassium 3.5 3.5 - 5.1 mmol/L   Chloride 110 98 - 111 mmol/L   CO2 21 (L) 22 - 32 mmol/L   Glucose, Bld 80 70 - 99 mg/dL    Comment: Glucose reference range applies only to samples taken after fasting for at least 8 hours.   BUN <5 (L) 6 - 20 mg/dL   Creatinine, Ser 6.57 0.44 - 1.00 mg/dL   Calcium 7.5 (L) 8.9 - 10.3 mg/dL   Total Protein 5.5 (L) 6.5 - 8.1 g/dL   Albumin 1.5 (L) 3.5 - 5.0 g/dL   AST 16 15 - 41 U/L   ALT 11 0 - 44 U/L   Alkaline Phosphatase 137 (H) 38 - 126 U/L   Total Bilirubin 0.4 0.3 - 1.2 mg/dL   GFR calc non Af Amer >60 >60 mL/min   GFR calc Af Amer >60 >60 mL/min   Anion gap 9 5 - 15    Comment: Performed at [facility], [address], [place], [place] [address]  CBC     Status: Abnormal   Collection Time: 09/08/19  6:14 AM  Result Value Ref Range   WBC 6.3 4.0 - 10.5 K/uL   RBC 3.46 (L) 3.87 - 5.11 MIL/uL   Hemoglobin 9.7 (L) 12.0 - 15.0 g/dL   HCT 29.5 (L) 36 - 46 %   MCV 90.5 80.0 - 100.0  fL   MCH 28.0 26.0 - 34.0 pg   MCHC 31.0 30.0 - 36.0 g/dL   RDW 12.8 (H) 78.6 - 76.7 %   Platelets 426 (H) 150 - 400 K/uL   nRBC  0.0 0.0 - 0.2 %    Comment: Performed at [facility], [address] [place], [place] [address]  Protime-INR     Status: Abnormal   Collection Time: 09/08/19  6:14 AM  Result Value Ref Range   Prothrombin Time 15.9 (H) 11.4 - 15.2 seconds   INR 1.3 (H) 0.8 - 1.2    Comment: (NOTE) INR goal varies based on device and disease states. Performed at [facility], [address], [place], [place] [address]   Magnesium     Status: Abnormal   Collection Time: 09/08/19  6:14 AM  Result Value Ref Range   Magnesium 1.5 (L) 1.7 - 2.4 mg/dL    Comment: Performed at [facility], [address] [place], [place], [place] [address]  Phosphorus     Status: None   Collection Time: 09/08/19  6:14 AM  Result Value Ref Range   Phosphorus 4.4 2.5 - 4.6 mg/dL    Comment: Performed at [facility], [address], [place], [place] [address]  C Difficile Quick Screen w PCR reflex     Status: Abnormal   Collection Time: 09/08/19  7:36 AM   Specimen: Stool  Result Value Ref Range   C Diff antigen POSITIVE (A) NEGATIVE   C Diff toxin NEGATIVE NEGATIVE   C Diff interpretation Results are indeterminate. See PCR results.     Comment: Performed at [facility], [address], [place], [place] [address]  Glucose, capillary     Status: None   Collection Time: 09/08/19  8:06 AM  Result Value Ref Range   Glucose-Capillary 82 70 - 99 mg/dL    Comment: Glucose reference range applies only to samples taken after fasting for at least 8 hours.    DG Chest 2 View  Result Date: 09/07/2019 CLINICAL DATA:  Infection EXAM: CHEST - 2 VIEW COMPARISON:  06/23/2019 07/08/2019 FINDINGS: The heart size and mediastinal contours are within normal limits. Both lungs are clear. The visualized skeletal structures are unremarkable. IMPRESSION: No active cardiopulmonary disease. Electronically Signed   By: Katherine Mantle M.D.   On: 09/07/2019 17:09   CT ABDOMEN PELVIS W CONTRAST  Result Date:  09/07/2019 CLINICAL DATA:  Drainage from lower abdominal/groin wound. Assess for deeper infection. EXAM: CT ABDOMEN AND PELVIS WITH CONTRAST TECHNIQUE: Multidetector CT imaging of the abdomen and pelvis was performed using the standard protocol following bolus administration of intravenous contrast. CONTRAST:  OMNIPAQUE IOHEXOL 300 MG/ML  SOLN COMPARISON:  09/05/2019 FINDINGS: Lower chest: Lung bases are clear. No effusions. Heart is normal size. Hepatobiliary: Diffuse fatty infiltration of the liver. No focal abnormality. 2.3 cm gallstone within the gallbladder. Pancreas: No focal abnormality or ductal dilatation. Spleen: No focal abnormality.  Normal size. Adrenals/Urinary Tract: No adrenal abnormality. No focal renal abnormality. No stones or hydronephrosis. Urinary bladder is unremarkable. Stomach/Bowel: Normal appendix. Stomach, large and small bowel grossly unremarkable. Vascular/Lymphatic: No evidence of aneurysm or adenopathy. Reproductive: Enlarged uterus with multiple fibroids. No adnexal masses. Other: No free fluid or free air. Musculoskeletal: Again noted is skin thickening along the anterior right groin region and upper thigh. Soft tissue extends from the thickened skin to the adductor longus muscle, similar to prior study. No drainable fluid  collection. No change since prior study. No acute bony abnormality. IMPRESSION: Continued skin thickening in the right groin region extending into the anterior thigh. The soft tissue extends deep to the adductor longus muscle and is stable since prior study. No drainable focal fluid collection. It is unclear how much of this reflects postsurgical changes/scarring versus infection. Enlarged fibroid uterus. Diffuse fatty infiltration of the liver. Cholelithiasis. Electronically Signed   By: Charlett Nose M.D.   On: 09/07/2019 22:32   DG Knee Complete 4 Views Left  Result Date: 09/07/2019 CLINICAL DATA:  Pain EXAM: LEFT KNEE - COMPLETE 4+ VIEW COMPARISON:   None. FINDINGS: There is no acute displaced fracture. There is no dislocation. Mild tricompartmental degenerative changes are noted. IMPRESSION: No acute displaced fracture or dislocation. Mild tricompartmental degenerative changes. Electronically Signed   By: Katherine Mantle M.D.   On: 09/07/2019 19:55   DG Knee Complete 4 Views Right  Result Date: 09/07/2019 CLINICAL DATA:  Pain status post fall EXAM: RIGHT KNEE - COMPLETE 4+ VIEW COMPARISON:  None. FINDINGS: Tricompartmental degenerative changes are noted. There is no acute displaced fracture. There is no dislocation. There is nonspecific soft tissue swelling about the lower extremity. IMPRESSION: No acute displaced fracture or dislocation. Tricompartmental degenerative changes are noted. Electronically Signed   By: Katherine Mantle M.D.   On: 09/07/2019 19:51    Review of Systems  Constitutional: Negative for chills.  HENT: Negative for congestion and sore throat.   Respiratory: Negative for cough and shortness of breath.   Cardiovascular: Negative for chest pain and palpitations.  Gastrointestinal: Positive for diarrhea (last few days). Negative for abdominal pain, nausea and vomiting.  Musculoskeletal: Positive for falls (yesterday) and myalgias (muscle pain and stiffness).  Skin: Negative for itching and rash.       'Leaking' (drainage) from right thigh/abdominal wound  Neurological: Positive for weakness.   Blood pressure 132/87, pulse 89, temperature 97.9 F (36.6 C), temperature source Oral, resp. rate 19, weight 98.3 kg, SpO2 100 %. Physical Exam Vitals and nursing note reviewed.  Constitutional:      General: She is not in acute distress.    Appearance: Normal appearance. She is obese.  HENT:     Head: Normocephalic and atraumatic.  Eyes:     Extraocular Movements: Extraocular movements intact.  Cardiovascular:     Pulses: Normal pulses.  Pulmonary:     Effort: Pulmonary effort is normal.  Skin:    General: Skin is  warm and dry.     Comments: Left abdominal wound remains intact. Well demarcated area of erythema on medial left abdomen, groin, and thigh, with foul odor. Patient denies pain of this area. Right abdominal/thigh wound healing with good granulation tissue formation.  Expected drainage from right wound without foul odor.   Neurological:     Mental Status: She is alert.  Psychiatric:        Mood and Affect: Mood normal.        Behavior: Behavior normal.        Thought Content: Thought content normal.        Judgment: Judgment normal.     Assessment/Plan:  Well demarcated area of erythema of right abdomen and thigh/groin. Right abdominal/thigh wound healing with granulation tissue formation.   Continue with IV abx. Do daily dressing changes of ABDs and secure with tape as needed for right abdominal/thigh wound. Do daily cleaning with saline of left area erythema (abdomen/groin/thigh) and cover  with ABD, inserting into skin fold to  keep area dry and prevent skin contact. May change ABS on both wounds as needed to keep wounds dry.  Continue to control sugar levels for optimal healing.  Hg A1C 4.9 on 6/28.  Recommend consult with General Surgery for left abdominal/groin infection. Will discuss with Plastics team and update plan as needed.   Eldridge AbrahamsJohanna C Kynadi Dragos, PA-C 09/08/2019, 11:18 AM

## 2019-09-08 NOTE — Progress Notes (Signed)
PT Cancellation Note  Patient Details Name: Kimberly Orozco MRN: [id number] DOB: 04-01-1975   Cancelled Treatment:    Reason Eval/Treat Not Completed: Medical issues which prohibited therapyPatient actively having large loose stool in bed. Required nursing assistance. Will re-attempt later.    Jamiah Homeyer 09/08/2019, 10:22 AM

## 2019-09-08 NOTE — ED Notes (Signed)
MD in room at this time.

## 2019-09-08 NOTE — Progress Notes (Signed)
Pharmacy Antibiotic Note  Kimberly Orozco is a 44 y.o. female admitted on 09/07/2019 with wound infection.  Pharmacy has been consulted to add Zosyn to ABX regimen.  Plan: Zosyn 3.375g IV q8h (4-hour infusion).  Temp (24hrs), Avg:99.3 F (37.4 C), Min:99.3 F (37.4 C), Max:99.3 F (37.4 C)  Recent Labs  Lab 09/05/19 2141 09/07/19 1636  WBC 9.0 10.8*  CREATININE 0.84 1.02*  LATICACIDVEN  --  1.2    Estimated Creatinine Clearance: 90.4 mL/min (A) (by C-G formula based on SCr of 1.02 mg/dL (H)).    No Known Allergies   Thank you for allowing pharmacy to be a part of this patient's care.  Vernard Gambles, PharmD, BCPS  09/08/2019 12:42 AM

## 2019-09-08 NOTE — Social Work (Signed)
CSW acknowledging consult for HH/DME. Pt from home active with [facility]. Has previous hx of discharge to SNF in May 2021.  Will follow for therapy recommendations needed to best determine disposition/for insurance authorization. Previously had Medicare Part A. Currently listed without any insurance coverage, have contacted Financial Counseling to review for any coverage.    Octavio Graves, MSW, LCSW [facility] Clinical Social Work

## 2019-09-08 NOTE — ED Notes (Signed)
Pt produced apx watery stool on bedside comode

## 2019-09-09 LAB — CBC WITH DIFFERENTIAL/PLATELET
Abs Immature Granulocytes: 0.02 10*3/uL (ref 0.00–0.07)
Basophils Absolute: 0 10*3/uL (ref 0.0–0.1)
Basophils Relative: 1 %
Eosinophils Absolute: 0.1 10*3/uL (ref 0.0–0.5)
Eosinophils Relative: 1 %
HCT: 31.5 % — ABNORMAL LOW (ref 36.0–46.0)
Hemoglobin: 9.6 g/dL — ABNORMAL LOW (ref 12.0–15.0)
Immature Granulocytes: 0 %
Lymphocytes Relative: 26 %
Lymphs Abs: 1.5 10*3/uL (ref 0.7–4.0)
MCH: 27.1 pg (ref 26.0–34.0)
MCHC: 30.5 g/dL (ref 30.0–36.0)
MCV: 89 fL (ref 80.0–100.0)
Monocytes Absolute: 0.7 10*3/uL (ref 0.1–1.0)
Monocytes Relative: 11 %
Neutro Abs: 3.6 10*3/uL (ref 1.7–7.7)
Neutrophils Relative %: 61 %
Platelets: 438 10*3/uL — ABNORMAL HIGH (ref 150–400)
RBC: 3.54 MIL/uL — ABNORMAL LOW (ref 3.87–5.11)
RDW: 17.2 % — ABNORMAL HIGH (ref 11.5–15.5)
WBC: 5.9 10*3/uL (ref 4.0–10.5)
nRBC: 0 % (ref 0.0–0.2)

## 2019-09-09 LAB — URINE CULTURE

## 2019-09-09 LAB — GLUCOSE, CAPILLARY
Glucose-Capillary: 95 mg/dL (ref 70–99)
Glucose-Capillary: 98 mg/dL (ref 70–99)
Glucose-Capillary: 89 mg/dL (ref 70–99)
Glucose-Capillary: 103 mg/dL — ABNORMAL HIGH (ref 70–99)
Glucose-Capillary: 97 mg/dL (ref 70–99)

## 2019-09-09 LAB — BASIC METABOLIC PANEL
Anion gap: 6 (ref 5–15)
BUN: <5 mg/dL — ABNORMAL LOW (ref 6–20)
CO2: 23 mmol/L (ref 22–32)
Calcium: 7.4 mg/dL — ABNORMAL LOW (ref 8.9–10.3)
Chloride: 110 mmol/L (ref 98–111)
Creatinine, Ser: 0.68 mg/dL (ref 0.44–1.00)
GFR calc Af Amer: >60 mL/min (ref >60)
GFR calc non Af Amer: >60 mL/min (ref >60)
Glucose, Bld: 88 mg/dL (ref 70–99)
Potassium: 3.7 mmol/L (ref 3.5–5.1)
Sodium: 139 mmol/L (ref 135–145)

## 2019-09-09 LAB — MAGNESIUM: Magnesium: 1.9 mg/dL (ref 1.7–2.4)

## 2019-09-09 MED ORDER — CALCIUM CARBONATE ANTACID 1250 MG/5ML PO SUSP
500.0000 mg | Freq: Three times a day (TID) | ORAL | Status: AC
  Administered 2019-09-10 – 2019-09-11 (×5): 500 mg via ORAL
  Filled 2019-09-09 (×6): qty 5

## 2019-09-09 MED ORDER — DIVALPROEX SODIUM 125 MG PO CSDR
250.0000 mg | DELAYED_RELEASE_CAPSULE | Freq: Two times a day (BID) | ORAL | Status: DC
  Administered 2019-09-09 – 2019-09-15 (×12): 250 mg via ORAL
  Filled 2019-09-09 (×13): qty 2

## 2019-09-09 MED ORDER — PROPRANOLOL HCL ER 80 MG PO CP24
80.0000 mg | ORAL_CAPSULE | Freq: Every day | ORAL | Status: DC
  Administered 2019-09-09 – 2019-09-15 (×7): 80 mg via ORAL
  Filled 2019-09-09 (×7): qty 1

## 2019-09-09 MED ORDER — BENZTROPINE MESYLATE 1 MG PO TABS
1.0000 mg | ORAL_TABLET | Freq: Two times a day (BID) | ORAL | Status: DC
  Administered 2019-09-09 – 2019-09-15 (×12): 1 mg via ORAL
  Filled 2019-09-09 (×13): qty 1

## 2019-09-09 NOTE — TOC Initial Note (Addendum)
Transition of Care [facility]) - Initial/Assessment Note    Patient Details  Name: Kimberly Orozco MRN: [id number] Date of Birth: [birth year]  Transition of Care [facility]) CM/SW Contact:    Kingsley Plan, RN Phone Number: 09/09/2019, 4:28 PM  Clinical Narrative:                  Patient from home with parents. Prior to admission patient was active with [facility].   Spoke to [place] at [facility] they will accept patient back for [facility].  Spoke to patient at bedside. She would like to go home at discharge with [facility]. Patient aware HHRN does not make daily visits and she would need assistance at home for wound care. Patient consented for NCM to call her mother Kimberly Orozco [phone].   NCM called Steward Drone and with permission placed her on speaker phone. Explained above. Steward Drone stated patient can come home however, she (mother) cannot assist patient with dressing changes. On days [facility] does not make visits dressing would not be changed. NCM explained to patient and mother that dressing would need to be changed daily per MD orders for healing , infection etc.  Steward Drone voiced understanding but still not able to assist patient and statestthere is no one else to help.  Discussed SNF. However, patient would be in co pay days and patient and mother unable to afford co pay.   NCM explained to patient and mother that once MD determines that patient is medically stable for discharge, insurance would not cover her hospital bill.   Steward Drone will discuss above with other family members tonight and call NCM back in morning with plan. Steward Drone has NCM direct phone number        Patient Goals and CMS Choice   CMS Medicare.gov Compare Post Acute Care list provided to:: Patient Choice offered to / list presented to : Patient  Expected Discharge Plan and Services     Discharge Planning Services: CM Consult Post Acute Care Choice: Home Health Living arrangements for the past 2 months:  Single Family Home                 DME Arranged: N/A           HH Agency: [facility] Date [facility] Agency Contacted: 09/09/19 Time HH Agency Contacted: 1627 Representative spoke with at [facility] Agency: [place]  Prior Living Arrangements/Services Living arrangements for the past 2 months: Single Family Home Lives with:: Parents Patient language and need for interpreter reviewed:: Yes Do you feel safe going back to the place where you live?: Yes      Need for Family Participation in Patient Care: Yes (Comment)     Criminal Activity/Legal Involvement Pertinent to Current Situation/Hospitalization: No - Comment as needed  Activities of Daily Living      Permission Sought/Granted   Permission granted to share information with : Yes, Verbal Permission Granted  Share Information with NAME: Kimberly Orozco [phone]  Permission granted to share info w AGENCY: [facility]        Emotional Assessment Appearance:: Appears stated age Attitude/Demeanor/Rapport: Engaged Affect (typically observed): Accepting Orientation: : Oriented to Situation, Oriented to  Time, Oriented to Place, Oriented to Self Alcohol / Substance Use: Not Applicable Psych Involvement: No (comment)  Admission diagnosis:  Wound infection [T14.8XXA, L08.9] Cellulitis, unspecified cellulitis site [L03.90] Patient Active Problem List   Diagnosis Date Noted  . Wound infection 09/08/2019  . Pressure injury of skin  09/08/2019  . C. difficile diarrhea 09/08/2019  . Open abdominal wall wound 08/04/2019  . Open thigh wound, right, subsequent encounter 08/04/2019  . Leukocytosis   . Septic shock (HCC)   . AKI (acute kidney injury) (HCC)   . Acute respiratory failure (HCC)   . Necrotizing cellulitis   . DKA (diabetic ketoacidoses) (HCC) 06/23/2019  . Diabetes mellitus type 2 in obese (HCC) 04/27/2019  . Bipolar disorder (HCC) 04/26/2019  . Schizophrenia (HCC) 04/25/2019   PCP:  Patient, No Pcp Per Pharmacy:    [facility] [phone] - [place], [place] - [phone] AT [facility] [address] [place] [address] Phone: [phone] Fax: [phone]     Social Determinants of Health (SDOH) Interventions    Readmission Risk Interventions Readmission Risk Prevention Plan 07/18/2019  Transportation Screening Complete  PCP or Specialist Appt within 5-7 Days Not Complete  Not Complete comments plan for SNF  Home Care Screening Complete  Medication Review (RN CM) Referral to Pharmacy  Some recent data might be hidden

## 2019-09-09 NOTE — Progress Notes (Signed)
PROGRESS NOTE    Kimberly Orozco  EML:[id number] DOB: [birth year] DOA: 09/07/2019 PCP: Patient, No Pcp Per    Brief Narrative:  Patient admitted to the hospital with a working diagnosis of complicated soft tissue infection.  44 year old female with significant past medical history for bipolar disorder, type 2 diabetes mellitus, and schizophrenia.  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  It was complicated by wound dehiscence x2.  Apparently patient had difficulty doing wound changes at home.  She was planned to get a skin graft on July 6.  Patient sustained a mechanical fall and was brought to the hospital, she reported having diarrhea for the last 3 days, having generalized malaise and body stiffness.  On her initial physical examination blood pressure 154/107, heart rate 101, respiratory rate 28, temperature 99.3, oxygen saturation 99%, lungs are clear to auscultation bilaterally, heart S1-S2, present rhythmic, soft abdomen, protuberant.  Open abdominal- right thigh wound, serosanguineous drainage no odor or pus. Sodium 137, potassium 3.1, chloride 106, bicarb 21, glucose 102, BUN less than 5, creatinine 1.0, white count 10.8, hemoglobin 11.1, hematocrit 36.4, platelets 525.  SARS COVID-19 negative.  Urinalysis more than 50 white cells, 21-50 red cells, 21-50 epithelial cells, specific gravity 1.029.  Serum testing positive antigen, negative toxin, positive PCR.  Chest radiograph with no infiltrates. CT of the abdomen and pelvis with skin thickening in the right groin, extending into the anterior thigh.  Soft tissue extends deep to the abductor longus muscle.  No drainable collection.  Assessment & Plan:   Principal Problem:   C. difficile diarrhea Active Problems:   Schizophrenia (HCC)   Bipolar disorder (HCC)   Diabetes mellitus type 2 in obese (HCC)   Wound infection   Pressure injury of skin   1.  Acute C diff diarrhea, present on admission.  Cryptosporidium in stool Patient with diarrhea for the last 3 days, progressive and severe. Stool positive for C diff antigen and PCR, negative for toxin. GI pathogen panel also positive for Cryptosporidium. Will start patient on oral vancomycin and will follow up on stool output and cell count. Continue to encourage po intake and will consult nutrition, PT and OT evaluation.   2. Large right thigh wound,  Plastic surgery consulted. Continue dressing changes for right groin and left groin wound. Personally below the left groin wound which does not appear to be having any evidence of infection for now. Given ongoing C. difficile and high risk for complication and low risk for active infection no antibiotics.  3. T2DM.  Will continue glucose cover and monitoring with insulin sliding scale.   4. Hypokalemia/ hypomagensemia. Likely due to GI losses, will continue K correction with Kcl 40 meq x2 and 2 g Mag sulfate IV.  Renal function with serum cr at 0,7 and serum bicarbonate at 21.   5. Bipolar and schizophrenia. Will resume patient's haldol 10 mg in am and 20 mg in pm.   6. Stage 2 buttock ulcer. Present on admission, continue with local wound care.   Patient continue to be at high risk for worsening c diff infection.   Status is: Inpatient  Remains inpatient appropriate because:Inpatient level of care appropriate due to severity of illness   Dispo: The patient is from: Home              Anticipated d/c is to: Home  Anticipated d/c date is: 3 days              Patient currently is not medically stable to d/c.   DVT prophylaxis: Enoxaparin   Code Status:   full  Family Communication:  No family at the bedside      Nutrition Status:           Skin Documentation: Pressure Injury 09/08/19  Stage 2 -  Partial thickness loss of dermis presenting as a shallow open injury with a red, pink wound bed without slough.  (Active)  09/08/19 0604  Location:   Location Orientation:   Staging: Stage 2 -  Partial thickness loss of dermis presenting as a shallow open injury with a red, pink wound bed without slough.  Wound Description (Comments):   Present on Admission: Yes     Consultants:   Plastic surgery    Antimicrobials:   Oral vancomycin     Subjective: Continues to have diarrhea.  Minimal oral intake.  No fever no chills.  No abdominal pain right now.  Objective: Vitals:   09/08/19 1437 09/08/19 1900 09/09/19 0708 09/09/19 1501  BP: (!) 132/93 (!) 142/89 (!) 154/104 (!) 161/110  Pulse: 94 94 95 85  Resp: 17 19 17 16   Temp: 98.1 F (36.7 C) 98.5 F (36.9 C) 97.9 F (36.6 C) 98.1 F (36.7 C)  TempSrc: Oral Oral Oral Oral  SpO2: 98% 100% 100% 100%  Weight:        Intake/Output Summary (Last 24 hours) at 09/09/2019 1846 Last data filed at 09/09/2019 1700 Gross per 24 hour  Intake 627.77 ml  Output --  Net 627.77 ml   Filed Weights   09/08/19 0413  Weight: 98.3 kg    Examination:   General: Not in pain or dyspnea, deconditioned  Neurology: Awake and alert, non focal  E ENT: no pallor, no icterus, oral mucosa moist Cardiovascular: No JVD. S1-S2 present, rhythmic, no gallops, rubs, or murmurs. No lower extremity edema. Pulmonary positive breath sounds bilaterally, adequate air movement, no wheezing, rhonchi or rales. Gastrointestinal. Abdomen with , no organomegaly, non tender, no rebound or guarding Skin. Right thigh with dressing in place.  Musculoskeletal: no joint deformities       Data Reviewed: I have personally reviewed following labs and imaging studies  CBC: Recent Labs  Lab 09/05/19 2141 09/07/19 1636 09/08/19 0025 09/08/19 0614 09/09/19 0219  WBC 9.0 10.8* 7.0 6.3 5.9  NEUTROABS  --  8.5*  --   --  3.6  HGB 11.1* 11.1* 9.6* 9.7* 9.6*  HCT 36.4 36.4 31.5* 31.3* 31.5*  MCV 89.9 90.8 91.3 90.5 89.0  PLT 483* 525* 390 426* 438*   Basic Metabolic  Panel: Recent Labs  Lab 09/05/19 2141 09/07/19 1636 09/08/19 0025 09/08/19 0614 09/09/19 0219  NA 137 137  --  140 139  K 3.0* 3.1*  --  3.5 3.7  CL 103 106  --  110 110  CO2 24 21*  --  21* 23  GLUCOSE 107* 102*  --  80 88  BUN <5* <5*  --  <5* <5*  CREATININE 0.84 1.02* 0.86 0.77 0.68  CALCIUM 7.9* 7.8*  --  7.5* 7.4*  MG  --   --   --  1.5* 1.9  PHOS  --   --   --  4.4  --    GFR: Estimated Creatinine Clearance: 108.1 mL/min (by C-G formula based on SCr of 0.68 mg/dL). Liver Function Tests: Recent Labs  Lab 09/05/19 2141 09/07/19 1636 09/08/19 0614  AST 23 18 16   ALT 12 13 11   ALKPHOS 145* 153* 137*  BILITOT 0.5 0.4 0.4  PROT 6.5 6.0* 5.5*  ALBUMIN 1.8* 1.6* 1.5*   No results for input(s): LIPASE, AMYLASE in the last 168 hours. No results for input(s): AMMONIA in the last 168 hours. Coagulation Profile: Recent Labs  Lab 09/08/19 0614  INR 1.3*   Cardiac Enzymes: Recent Labs  Lab 09/07/19 1636  CKTOTAL 35*   BNP (last 3 results) No results for input(s): PROBNP in the last 8760 hours. HbA1C: Recent Labs    09/08/19 0039  HGBA1C 4.9   CBG: Recent Labs  Lab 09/08/19 2101 09/09/19 0625 09/09/19 0744 09/09/19 1203 09/09/19 1647  GLUCAP 92 95 98 89 103*   Lipid Profile: No results for input(s): CHOL, HDL, LDLCALC, TRIG, CHOLHDL, LDLDIRECT in the last 72 hours. Thyroid Function Tests: Recent Labs    09/08/19 0033  TSH 1.504   Anemia Panel: No results for input(s): VITAMINB12, FOLATE, FERRITIN, TIBC, IRON, RETICCTPCT in the last 72 hours.    Radiology Studies: I have reviewed all of the imaging during this hospital visit personally     Scheduled Meds:  haloperidol  10 mg Oral q morning - 10a   haloperidol  20 mg Oral QHS   heparin  5,000 Units Subcutaneous Q8H   insulin aspart  0-9 Units Subcutaneous TID WC   potassium chloride  40 mEq Oral BID   sodium chloride flush  3 mL Intravenous Once   sodium chloride flush  3 mL  Intravenous Q12H   vancomycin  125 mg Oral QID   Continuous Infusions:    LOS: 1 day        09/11/19, MD

## 2019-09-09 NOTE — Plan of Care (Signed)
  Problem: Nutrition: Goal: Adequate nutrition will be maintained Outcome: Progressing   Problem: Elimination: Goal: Will not experience complications related to bowel motility Outcome: Progressing Goal: Will not experience complications related to urinary retention Outcome: Progressing   

## 2019-09-09 NOTE — Progress Notes (Signed)
Pt c/o of tremors of the upper and lower extremities. Pt stated that "they are worse than before." This RN assessed pt. Pt vitals are stable. Blood glucose WDL. MD on call notified about pts concern. No new orders given at this time. Will continue to monitor pt.

## 2019-09-10 LAB — GLUCOSE, CAPILLARY
Glucose-Capillary: 96 mg/dL (ref 70–99)
Glucose-Capillary: 101 mg/dL — ABNORMAL HIGH (ref 70–99)
Glucose-Capillary: 111 mg/dL — ABNORMAL HIGH (ref 70–99)
Glucose-Capillary: 87 mg/dL (ref 70–99)
Glucose-Capillary: 101 mg/dL — ABNORMAL HIGH (ref 70–99)

## 2019-09-10 LAB — CBC WITH DIFFERENTIAL/PLATELET
Abs Immature Granulocytes: 0.03 10*3/uL (ref 0.00–0.07)
Basophils Absolute: 0 10*3/uL (ref 0.0–0.1)
Basophils Relative: 0 %
Eosinophils Absolute: 0.1 10*3/uL (ref 0.0–0.5)
Eosinophils Relative: 1 %
HCT: 32.7 % — ABNORMAL LOW (ref 36.0–46.0)
Hemoglobin: 9.9 g/dL — ABNORMAL LOW (ref 12.0–15.0)
Immature Granulocytes: 1 %
Lymphocytes Relative: 23 %
Lymphs Abs: 1.5 10*3/uL (ref 0.7–4.0)
MCH: 27.1 pg (ref 26.0–34.0)
MCHC: 30.3 g/dL (ref 30.0–36.0)
MCV: 89.6 fL (ref 80.0–100.0)
Monocytes Absolute: 0.6 10*3/uL (ref 0.1–1.0)
Monocytes Relative: 9 %
Neutro Abs: 4.4 10*3/uL (ref 1.7–7.7)
Neutrophils Relative %: 66 %
Platelets: 425 10*3/uL — ABNORMAL HIGH (ref 150–400)
RBC: 3.65 MIL/uL — ABNORMAL LOW (ref 3.87–5.11)
RDW: 17 % — ABNORMAL HIGH (ref 11.5–15.5)
WBC: 6.6 10*3/uL (ref 4.0–10.5)
nRBC: 0 % (ref 0.0–0.2)

## 2019-09-10 LAB — COMPREHENSIVE METABOLIC PANEL
ALT: 14 U/L (ref 0–44)
AST: 44 U/L — ABNORMAL HIGH (ref 15–41)
Albumin: 1.5 g/dL — ABNORMAL LOW (ref 3.5–5.0)
Alkaline Phosphatase: 124 U/L (ref 38–126)
Anion gap: 6 (ref 5–15)
BUN: <5 mg/dL — ABNORMAL LOW (ref 6–20)
CO2: 24 mmol/L (ref 22–32)
Calcium: 8 mg/dL — ABNORMAL LOW (ref 8.9–10.3)
Chloride: 111 mmol/L (ref 98–111)
Creatinine, Ser: 0.67 mg/dL (ref 0.44–1.00)
GFR calc Af Amer: >60 mL/min (ref >60)
GFR calc non Af Amer: >60 mL/min (ref >60)
Glucose, Bld: 105 mg/dL — ABNORMAL HIGH (ref 70–99)
Potassium: 4.3 mmol/L (ref 3.5–5.1)
Sodium: 141 mmol/L (ref 135–145)
Total Bilirubin: 0.1 mg/dL — ABNORMAL LOW (ref 0.3–1.2)
Total Protein: 5.8 g/dL — ABNORMAL LOW (ref 6.5–8.1)

## 2019-09-10 LAB — CULTURE, BLOOD (SINGLE)
Culture: NO GROWTH
Special Requests: ADEQUATE

## 2019-09-10 LAB — MAGNESIUM: Magnesium: 1.6 mg/dL — ABNORMAL LOW (ref 1.7–2.4)

## 2019-09-10 MED ORDER — MAGNESIUM SULFATE 2 GM/50ML IV SOLN
2.0000 g | Freq: Once | INTRAVENOUS | Status: AC
  Administered 2019-09-10: 2 g via INTRAVENOUS
  Filled 2019-09-10: qty 50

## 2019-09-10 NOTE — Progress Notes (Signed)
PROGRESS NOTE    Kimberly Orozco  XTG:[id number] DOB: 1975/11/18 DOA: 09/07/2019 PCP: Patient, No Pcp Per    Brief Narrative:  Patient admitted to the hospital with a working diagnosis of complicated soft tissue infection.  44 year old female with significant past medical history for bipolar disorder, type 2 diabetes mellitus, and schizophrenia.  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  It was complicated by wound dehiscence x2.  Apparently patient had difficulty doing wound changes at home.  She was planned to get a skin graft on July 6.  Patient sustained a mechanical fall and was brought to the hospital, she reported having diarrhea for the last 3 days, having generalized malaise and body stiffness.  On her initial physical examination blood pressure 154/107, heart rate 101, respiratory rate 28, temperature 99.3, oxygen saturation 99%, lungs are clear to auscultation bilaterally, heart S1-S2, present rhythmic, soft abdomen, protuberant.  Open abdominal- right thigh wound, serosanguineous drainage no odor or pus. Sodium 137, potassium 3.1, chloride 106, bicarb 21, glucose 102, BUN less than 5, creatinine 1.0, white count 10.8, hemoglobin 11.1, hematocrit 36.4, platelets 525.  SARS COVID-19 negative.  Urinalysis more than 50 white cells, 21-50 red cells, 21-50 epithelial cells, specific gravity 1.029.  Serum testing positive antigen, negative toxin, positive PCR.  Chest radiograph with no infiltrates. CT of the abdomen and pelvis with skin thickening in the right groin, extending into the anterior thigh.  Soft tissue extends deep to the abductor longus muscle.  No drainable collection.  Assessment & Plan:   Principal Problem:   C. difficile diarrhea Active Problems:   Schizophrenia (HCC)   Bipolar disorder (HCC)   Diabetes mellitus type 2 in obese (HCC)   Wound infection   Pressure injury of skin   1.  Acute C diff diarrhea, present on admission.  Cryptosporidium in stool Patient with diarrhea for the last 3 days, progressive and severe. Stool positive for C diff antigen and PCR, negative for toxin. GI pathogen panel also positive for Cryptosporidium. on oral vancomycin and will follow up on stool output and cell count. Continue to encourage po intake and will consult nutrition, PT and OT evaluation.   2. Large right thigh wound,  Plastic surgery consulted. Continue dressing changes for right groin and left groin wound. Personally below the left groin wound which does not appear to be having any evidence of infection for now. Given ongoing C. difficile and high risk for complication and low risk for active infection no antibiotics.  3. T2DM.  Will continue glucose cover and monitoring with insulin sliding scale.   4. Hypokalemia/ hypomagensemia. Likely due to GI losses,  Continue to replace with scheduled potassium and as needed magnesium.  5. Bipolar and schizophrenia. Will resume patient's haldol 10 mg in am and 20 mg in pm.   6. Stage 2 buttock ulcer. Present on admission, continue with local wound care.  Status is: Inpatient  Remains inpatient appropriate because:Inpatient level of care appropriate due to severity of illness   Dispo: The patient is from: Home              Anticipated d/c is to: Home              Anticipated d/c date is: 3 days              Patient currently is not medically stable to d/c.   DVT prophylaxis: Enoxaparin   Code Status:  full  Family Communication:  No family at the bedside    Skin Documentation: Pressure Injury 09/08/19  Stage 2 -  Partial thickness loss of dermis presenting as a shallow open injury with a red, pink wound bed without slough. (Active)  09/08/19 0604  Location:   Location Orientation:   Staging: Stage 2 -  Partial thickness loss of dermis presenting as a shallow open injury with a red, pink wound bed without slough.    Wound Description (Comments):   Present on Admission: Yes   Consultants:   Plastic surgery    Antimicrobials:   Oral vancomycin     Subjective: 3 episodes of diarrhea yesterday with 6 episodes of diarrhea today.  No abdominal pain no nausea no vomiting.  No fever no chills.  Oral intake adequate yesterday but minimal today.  Objective: Vitals:   09/09/19 1501 09/09/19 1930 09/10/19 0626 09/10/19 1410  BP: (!) 161/110 (!) 160/93 (!) 156/111 (!) 152/99  Pulse: 85 97  73  Resp: 16 16 18 18   Temp: 98.1 F (36.7 C) 98.4 F (36.9 C) 98.2 F (36.8 C) 98.2 F (36.8 C)  TempSrc: Oral Oral Oral Oral  SpO2: 100% 100% 100% 100%  Weight:        Intake/Output Summary (Last 24 hours) at 09/10/2019 1932 Last data filed at 09/10/2019 1507 Gross per 24 hour  Intake 650 ml  Output 300 ml  Net 350 ml   Filed Weights   09/08/19 0413  Weight: 98.3 kg    Examination:   General: Not in pain or dyspnea, deconditioned  Neurology: Awake and alert, non focal  E ENT: no pallor, no icterus, oral mucosa moist Cardiovascular: No JVD. S1-S2 present, rhythmic, no gallops, rubs, or murmurs. No lower extremity edema. Pulmonary positive breath sounds bilaterally, adequate air movement, no wheezing, rhonchi or rales. Gastrointestinal. Abdomen with , no organomegaly, non tender, no rebound or guarding Skin. Right thigh with dressing in place.  Musculoskeletal: no joint deformities  Data Reviewed: I have personally reviewed following labs and imaging studies  CBC: Recent Labs  Lab 09/07/19 1636 09/08/19 0025 09/08/19 0614 09/09/19 0219 09/10/19 0841  WBC 10.8* 7.0 6.3 5.9 6.6  NEUTROABS 8.5*  --   --  3.6 4.4  HGB 11.1* 9.6* 9.7* 9.6* 9.9*  HCT 36.4 31.5* 31.3* 31.5* 32.7*  MCV 90.8 91.3 90.5 89.0 89.6  PLT 525* 390 426* 438* 425*   Basic Metabolic Panel: Recent Labs  Lab 09/05/19 2141 09/05/19 2141 09/07/19 1636 09/08/19 0025 09/08/19 0614 09/09/19 0219 09/10/19 0841  NA  137  --  137  --  140 139 141  K 3.0*  --  3.1*  --  3.5 3.7 4.3  CL 103  --  106  --  110 110 111  CO2 24  --  21*  --  21* 23 24  GLUCOSE 107*  --  102*  --  80 88 105*  BUN <5*  --  <5*  --  <5* <5* <5*  CREATININE 0.84   < > 1.02* 0.86 0.77 0.68 0.67  CALCIUM 7.9*  --  7.8*  --  7.5* 7.4* 8.0*  MG  --   --   --   --  1.5* 1.9 1.6*  PHOS  --   --   --   --  4.4  --   --    < > = values in this interval not displayed.   GFR: Estimated Creatinine Clearance: 108.1 mL/min (by C-G  formula based on SCr of 0.67 mg/dL). Liver Function Tests: Recent Labs  Lab 09/05/19 2141 09/07/19 1636 09/08/19 0614 09/10/19 0841  AST 23 18 16  44*  ALT 12 13 11 14   ALKPHOS 145* 153* 137* 124  BILITOT 0.5 0.4 0.4 0.1*  PROT 6.5 6.0* 5.5* 5.8*  ALBUMIN 1.8* 1.6* 1.5* 1.5*   No results for input(s): LIPASE, AMYLASE in the last 168 hours. No results for input(s): AMMONIA in the last 168 hours. Coagulation Profile: Recent Labs  Lab 09/08/19 0614  INR 1.3*   Cardiac Enzymes: Recent Labs  Lab 09/07/19 1636  CKTOTAL 35*   BNP (last 3 results) No results for input(s): PROBNP in the last 8760 hours. HbA1C: Recent Labs    09/08/19 0039  HGBA1C 4.9   CBG: Recent Labs  Lab 09/09/19 2023 09/10/19 0523 09/10/19 0752 09/10/19 1146 09/10/19 1701  GLUCAP 97 96 101* 111* 87   Lipid Profile: No results for input(s): CHOL, HDL, LDLCALC, TRIG, CHOLHDL, LDLDIRECT in the last 72 hours. Thyroid Function Tests: Recent Labs    09/08/19 0033  TSH 1.504   Anemia Panel: No results for input(s): VITAMINB12, FOLATE, FERRITIN, TIBC, IRON, RETICCTPCT in the last 72 hours.    Radiology Studies: I have reviewed all of the imaging during this hospital visit personally     Scheduled Meds: . benztropine  1 mg Oral BID  . calcium carbonate (dosed in mg elemental calcium)  500 mg of elemental calcium Oral TID WC  . divalproex  250 mg Oral Q12H  . haloperidol  10 mg Oral q morning - 10a  .  haloperidol  20 mg Oral QHS  . heparin  5,000 Units Subcutaneous Q8H  . insulin aspart  0-9 Units Subcutaneous TID WC  . potassium chloride  40 mEq Oral BID  . propranolol ER  80 mg Oral Daily  . sodium chloride flush  3 mL Intravenous Once  . sodium chloride flush  3 mL Intravenous Q12H  . vancomycin  125 mg Oral QID   Continuous Infusions:    LOS: 2 days        09/12/19, MD

## 2019-09-10 NOTE — TOC Progression Note (Signed)
Transition of Care [facility]) - Progression Note    Patient Details  Name: Kimberly Orozco MRN: [id number] Date of Birth: 08/02/1975  Transition of Care [facility]) CM/SW Contact  Nadene Rubins Adria Devon, RN Phone Number: 09/10/2019, 2:02 PM  Clinical Narrative:     Spoke to patient's mother Steward Drone via phone. Steward Drone requesting [employer] complete and submit Medicaid application while patient is in the hospital. Explained she has to go to [facility] and Medicaid takes 60 to 90 days. TOC team will email medassist.   Discussed with Steward Drone the discharge plan. Steward Drone wants patient to discharge [facility] home with [facility] and they will do the best they can. They do not want SNF         Expected Discharge Plan and Services     Discharge Planning Services: CM Consult Post Acute Care Choice: Home Health Living arrangements for the past 2 months: Single Family Home                 DME Arranged: N/A           HH Agency: [facility] Date HH Agency Contacted: 09/09/19 Time HH Agency Contacted: 1627 Representative spoke with at [facility] Agency: [place]   Social Determinants of Health (SDOH) Interventions    Readmission Risk Interventions Readmission Risk Prevention Plan 07/18/2019  Transportation Screening Complete  PCP or Specialist Appt within 5-7 Days Not Complete  Not Complete comments plan for SNF  Home Care Screening Complete  Medication Review (RN CM) Referral to Pharmacy  Some recent data might be hidden

## 2019-09-10 NOTE — Plan of Care (Signed)
  Problem: Activity: Goal: Risk for activity intolerance will decrease Outcome: Progressing   Problem: Nutrition: Goal: Adequate nutrition will be maintained Outcome: Progressing   Problem: Pain Managment: Goal: General experience of comfort will improve Outcome: Progressing   

## 2019-09-10 NOTE — Progress Notes (Signed)
Physical Therapy Treatment Patient Details Name: Kimberly Orozco MRN: [id number] DOB: 20-May-1975 Today's Date: 09/10/2019    History of Present Illness Kimberly Orozco is a 44 y.o. female with medical history significant of bipolar disorder, diabetes, schizophrenia, recent history of necrotizing fasciitis affecting abdomen and right thigh in  April s/p multiple debridements. Course complicated by right abdominal wound dehiscence x2. Patient currently is followed by Dr Arita Miss and has plans for skin graft to assist in wound healing on July 6. Patient now presents to ed s/p fall and on evaluation found to have wound infection.    PT Comments    Pt seated edge of bed on arrival attempting to move into standing.  Pt required assistance during pericare due to LOB posterior.  Progressed patient to stair training.  She require min to min guard assistance.  Continue to recommend no PT follow up as the more she moves her balance should resolve.     Follow Up Recommendations  No PT follow up     Equipment Recommendations  None recommended by PT    Recommendations for Other Services       Precautions / Restrictions Precautions Precautions: Fall Restrictions Weight Bearing Restrictions: No    Mobility  Bed Mobility Overal bed mobility: Needs Assistance Bed Mobility: Supine to Sit;Sit to Supine           General bed mobility comments: Pt seated edge of bed on arrival.  Transfers Overall transfer level: Needs assistance Equipment used: 1 person hand held assist Transfers: Sit to/from Stand Sit to Stand: Min guard         General transfer comment: Pt cued for hand placement.  Ambulation/Gait Ambulation/Gait assistance: Min guard;Min assist Gait Distance (Feet): 200 Feet Assistive device: 1 person hand held assist Gait Pattern/deviations: Step-through pattern;Trunk flexed;Leaning posteriorly Gait velocity: decr   General Gait Details: LOB posterior in standing required min  assistance to correct.  Otherwise min guard.   Stairs Stairs: Yes Stairs assistance: Min assist Stair Management: One rail Right;Forwards;Step to pattern Number of Stairs: 4 General stair comments: Cues for sequencing and use of rails.  Mild instability on stairs.   Wheelchair Mobility    Modified Rankin (Stroke Patients Only)       Balance Overall balance assessment: Needs assistance   Sitting balance-Leahy Scale: Fair       Standing balance-Leahy Scale: Poor                              Cognition Arousal/Alertness: Awake/alert Behavior During Therapy: Flat affect Overall Cognitive Status: Within Functional Limits for tasks assessed                                 General Comments: Patient is pleasant, agreeable, cooperative      Exercises      General Comments General comments (skin integrity, edema, etc.): O2 on RA      Pertinent Vitals/Pain      Home Living                      Prior Function            PT Goals (current goals can now be found in the care plan section) Acute Rehab PT Goals Time For Goal Achievement: 09/15/19 Progress towards PT goals: Progressing toward goals    Frequency    Min 3X/week  PT Plan Current plan remains appropriate    Co-evaluation              AM-PAC PT "6 Clicks" Mobility   Outcome Measure  Help needed turning from your back to your side while in a flat bed without using bedrails?: A Little Help needed moving from lying on your back to sitting on the side of a flat bed without using bedrails?: A Little Help needed moving to and from a bed to a chair (including a wheelchair)?: A Little Help needed standing up from a chair using your arms (e.g., wheelchair or bedside chair)?: None Help needed to walk in hospital room?: A Little Help needed climbing 3-5 steps with a railing? : A Little 6 Click Score: 19    End of Session Equipment Utilized During Treatment:  Gait belt Activity Tolerance: Patient tolerated treatment well Patient left: in chair;with call bell/phone within reach Nurse Communication: Mobility status PT Visit Diagnosis: Muscle weakness (generalized) (M62.81)     Time: 4158-3094 PT Time Calculation (min) (ACUTE ONLY): 22 min  Charges:  $Gait Training: 8-22 mins                     Bonney Leitz , PTA Acute Rehabilitation Services Pager [phone] Office [phone]     Decorey Wahlert Artis Delay 09/10/2019, 4:47 PM

## 2019-09-11 ENCOUNTER — Inpatient Hospital Stay (HOSPITAL_COMMUNITY): Payer: Medicare Other

## 2019-09-11 LAB — BASIC METABOLIC PANEL
Anion gap: 8 (ref 5–15)
BUN: <5 mg/dL — ABNORMAL LOW (ref 6–20)
CO2: 23 mmol/L (ref 22–32)
Calcium: 8.5 mg/dL — ABNORMAL LOW (ref 8.9–10.3)
Chloride: 107 mmol/L (ref 98–111)
Creatinine, Ser: 0.68 mg/dL (ref 0.44–1.00)
GFR calc Af Amer: >60 mL/min (ref >60)
GFR calc non Af Amer: >60 mL/min (ref >60)
Glucose, Bld: 89 mg/dL (ref 70–99)
Potassium: 4.7 mmol/L (ref 3.5–5.1)
Sodium: 138 mmol/L (ref 135–145)

## 2019-09-11 LAB — GLUCOSE, CAPILLARY
Glucose-Capillary: 111 mg/dL — ABNORMAL HIGH (ref 70–99)
Glucose-Capillary: 94 mg/dL (ref 70–99)
Glucose-Capillary: 100 mg/dL — ABNORMAL HIGH (ref 70–99)
Glucose-Capillary: 96 mg/dL (ref 70–99)

## 2019-09-11 LAB — CBC
HCT: 35.8 % — ABNORMAL LOW (ref 36.0–46.0)
Hemoglobin: 10.9 g/dL — ABNORMAL LOW (ref 12.0–15.0)
MCH: 27.2 pg (ref 26.0–34.0)
MCHC: 30.4 g/dL (ref 30.0–36.0)
MCV: 89.3 fL (ref 80.0–100.0)
Platelets: 513 10*3/uL — ABNORMAL HIGH (ref 150–400)
RBC: 4.01 MIL/uL (ref 3.87–5.11)
RDW: 17.3 % — ABNORMAL HIGH (ref 11.5–15.5)
WBC: 6.9 10*3/uL (ref 4.0–10.5)
nRBC: 0 % (ref 0.0–0.2)

## 2019-09-11 LAB — MAGNESIUM: Magnesium: 1.7 mg/dL (ref 1.7–2.4)

## 2019-09-11 NOTE — Progress Notes (Signed)
PROGRESS NOTE    Kimberly Orozco  YWV:[id number] DOB: November 17, 1975 DOA: 09/07/2019 PCP: Patient, No Pcp Per    Brief Narrative:  Patient admitted to the hospital with a working diagnosis of complicated soft tissue infection.  44 year old female with significant past medical history for bipolar disorder, type 2 diabetes mellitus, and schizophrenia.  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  It was complicated by wound dehiscence x2.  Apparently patient had difficulty doing wound changes at home.  She was planned to get a skin graft on July 6.  Patient sustained a mechanical fall and was brought to the hospital, she reported having diarrhea for the last 3 days, having generalized malaise and body stiffness.  On her initial physical examination blood pressure 154/107, heart rate 101, respiratory rate 28, temperature 99.3, oxygen saturation 99%, lungs are clear to auscultation bilaterally, heart S1-S2, present rhythmic, soft abdomen, protuberant.  Open abdominal- right thigh wound, serosanguineous drainage no odor or pus. Sodium 137, potassium 3.1, chloride 106, bicarb 21, glucose 102, BUN less than 5, creatinine 1.0, white count 10.8, hemoglobin 11.1, hematocrit 36.4, platelets 525.  SARS COVID-19 negative.  Urinalysis more than 50 white cells, 21-50 red cells, 21-50 epithelial cells, specific gravity 1.029.  Serum testing positive antigen, negative toxin, positive PCR.  Chest radiograph with no infiltrates. CT of the abdomen and pelvis with skin thickening in the right groin, extending into the anterior thigh.  Soft tissue extends deep to the abductor longus muscle.  No drainable collection.  Assessment & Plan: 1. Acute C diff diarrhea, present on admission.  Cryptosporidium in stool Patient with diarrhea for the last 3 days, progressive and severe. Stool positive for C diff antigen and PCR, negative for toxin. GI  pathogen panel also positive for Cryptosporidium. Discussed with ID currently holding off on treatment for Cryptosporidium given self-limiting nature of the disease. on oral vancomycin Continues to have more than 5 bowel movements in a day. Lab work remarkable is stable. Continue to encourage po intake   2. Large right thigh wound Recent necrotizing fasciitis  Plastic surgery consulted. Continue dressing changes for right groin and left groin wound. Personally below the left groin wound which does not appear to be having any evidence of infection for now. Given ongoing C. difficile and high risk for complication and low risk for active infection no antibiotics. Discussed with plastics.  Patient likely will still be receiving treatment for C. difficile colitis while scheduled for surgery on 09/16/2019.  Currently more likely the surgery will be postponed.  Also inform patient's family and patient.  3. T2DM.  Will continue glucose cover and monitoring with insulin sliding scale.   4. Hypokalemia/ hypomagensemia. Likely due to GI losses,  Continue to replace with scheduled potassium and as needed magnesium.  5. Bipolar and schizophrenia. Will resume patient's haldol 10 mg in am and 20 mg in pm.   6. Stage 2 buttock ulcer. Present on admission, continue with local wound care.  Status is: Inpatient  Remains inpatient appropriate because:Inpatient level of care appropriate due to severity of illness   Dispo: The patient is from: Home              Anticipated d/c is to: Home              Anticipated d/c date is: 3 days              Patient currently is not  medically stable to d/c.   DVT prophylaxis: Enoxaparin   Code Status:   full  Family Communication:  No family at the bedside    Skin Documentation: Pressure Injury 09/08/19  Stage 2 -  Partial thickness loss of dermis presenting as a shallow open injury with a red, pink wound bed without slough. (Active)  09/08/19 0604  Location:    Location Orientation:   Staging: Stage 2 -  Partial thickness loss of dermis presenting as a shallow open injury with a red, pink wound bed without slough.  Wound Description (Comments):   Present on Admission: Yes   Consultants:   Plastic surgery    Antimicrobials:   Oral vancomycin     Subjective: Oral intake improving.  Continues to have diarrhea.  No fever no chills.  No abdominal pain no nausea. Objective: Vitals:   09/10/19 1410 09/10/19 2132 09/11/19 0523 09/11/19 1521  BP: (!) 152/99 (!) 137/92 (!) 151/86 (!) 148/93  Pulse: 73 73 75 72  Resp: 18 17 17 18   Temp: 98.2 F (36.8 C) 97.9 F (36.6 C) 98.1 F (36.7 C) 97.9 F (36.6 C)  TempSrc: Oral Oral Oral Oral  SpO2: 100% 99% 100% 100%  Weight:        Intake/Output Summary (Last 24 hours) at 09/11/2019 1842 Last data filed at 09/11/2019 0011 Gross per 24 hour  Intake --  Output 2 ml  Net -2 ml   Filed Weights   09/08/19 0413  Weight: 98.3 kg    Examination:   General: Not in pain or dyspnea, deconditioned  Neurology: Awake and alert, non focal  E ENT: no pallor, no icterus, oral mucosa moist Cardiovascular: No JVD. S1-S2 present, rhythmic, no gallops, rubs, or murmurs. No lower extremity edema. Pulmonary positive breath sounds bilaterally, adequate air movement, no wheezing, rhonchi or rales. Gastrointestinal. Abdomen with , no organomegaly, non tender, no rebound or guarding Skin. Right thigh with dressing in place.  Musculoskeletal: no joint deformities  Data Reviewed: I have personally reviewed following labs and imaging studies  CBC: Recent Labs  Lab 09/07/19 1636 09/07/19 1636 09/08/19 0025 09/08/19 09/10/19 09/09/19 0219 09/10/19 0841 09/11/19 0209  WBC 10.8*   < > 7.0 6.3 5.9 6.6 6.9  NEUTROABS 8.5*  --   --   --  3.6 4.4  --   HGB 11.1*   < > 9.6* 9.7* 9.6* 9.9* 10.9*  HCT 36.4   < > 31.5* 31.3* 31.5* 32.7* 35.8*  MCV 90.8   < > 91.3 90.5 89.0 89.6 89.3  PLT 525*   < > 390 426* 438*  425* 513*   < > = values in this interval not displayed.   Basic Metabolic Panel: Recent Labs  Lab 09/07/19 1636 09/07/19 1636 09/08/19 0025 09/08/19 09/10/19 09/09/19 0219 09/10/19 0841 09/11/19 0209  NA 137  --   --  140 139 141 138  K 3.1*  --   --  3.5 3.7 4.3 4.7  CL 106  --   --  110 110 111 107  CO2 21*  --   --  21* 23 24 23   GLUCOSE 102*  --   --  80 88 105* 89  BUN <5*  --   --  <5* <5* <5* <5*  CREATININE 1.02*   < > 0.86 0.77 0.68 0.67 0.68  CALCIUM 7.8*  --   --  7.5* 7.4* 8.0* 8.5*  MG  --   --   --  1.5* 1.9 1.6*  1.7  PHOS  --   --   --  4.4  --   --   --    < > = values in this interval not displayed.   GFR: Estimated Creatinine Clearance: 108.1 mL/min (by C-G formula based on SCr of 0.68 mg/dL). Liver Function Tests: Recent Labs  Lab 09/05/19 2141 09/07/19 1636 09/08/19 0614 09/10/19 0841  AST 23 18 16  44*  ALT 12 13 11 14   ALKPHOS 145* 153* 137* 124  BILITOT 0.5 0.4 0.4 0.1*  PROT 6.5 6.0* 5.5* 5.8*  ALBUMIN 1.8* 1.6* 1.5* 1.5*   No results for input(s): LIPASE, AMYLASE in the last 168 hours. No results for input(s): AMMONIA in the last 168 hours. Coagulation Profile: Recent Labs  Lab 09/08/19 0614  INR 1.3*   Cardiac Enzymes: Recent Labs  Lab 09/07/19 1636  CKTOTAL 35*   BNP (last 3 results) No results for input(s): PROBNP in the last 8760 hours. HbA1C: No results for input(s): HGBA1C in the last 72 hours. CBG: Recent Labs  Lab 09/10/19 1701 09/10/19 2117 09/11/19 0754 09/11/19 1146 09/11/19 1731  GLUCAP 87 101* 111* 94 100*   Lipid Profile: No results for input(s): CHOL, HDL, LDLCALC, TRIG, CHOLHDL, LDLDIRECT in the last 72 hours. Thyroid Function Tests: No results for input(s): TSH, T4TOTAL, FREET4, T3FREE, THYROIDAB in the last 72 hours. Anemia Panel: No results for input(s): VITAMINB12, FOLATE, FERRITIN, TIBC, IRON, RETICCTPCT in the last 72 hours.    Radiology Studies: I have reviewed all of the imaging during this  hospital visit personally     Scheduled Meds:  benztropine  1 mg Oral BID   calcium carbonate (dosed in mg elemental calcium)  500 mg of elemental calcium Oral TID WC   divalproex  250 mg Oral Q12H   haloperidol  10 mg Oral q morning - 10a   haloperidol  20 mg Oral QHS   heparin  5,000 Units Subcutaneous Q8H   insulin aspart  0-9 Units Subcutaneous TID WC   potassium chloride  40 mEq Oral BID   propranolol ER  80 mg Oral Daily   sodium chloride flush  3 mL Intravenous Once   sodium chloride flush  3 mL Intravenous Q12H   vancomycin  125 mg Oral QID   Continuous Infusions:    LOS: 3 days        11/12/19, MD

## 2019-09-12 ENCOUNTER — Inpatient Hospital Stay (HOSPITAL_COMMUNITY): Admission: RE | Admit: 2019-09-12 | Payer: Self-pay | Source: Ambulatory Visit

## 2019-09-12 LAB — CBC
HCT: 29.6 % — ABNORMAL LOW (ref 36.0–46.0)
Hemoglobin: 9.6 g/dL — ABNORMAL LOW (ref 12.0–15.0)
MCH: 29.4 pg (ref 26.0–34.0)
MCHC: 32.4 g/dL (ref 30.0–36.0)
MCV: 90.8 fL (ref 80.0–100.0)
Platelets: 437 10*3/uL — ABNORMAL HIGH (ref 150–400)
RBC: 3.26 MIL/uL — ABNORMAL LOW (ref 3.87–5.11)
RDW: 17.7 % — ABNORMAL HIGH (ref 11.5–15.5)
WBC: 7.3 10*3/uL (ref 4.0–10.5)
nRBC: 0 % (ref 0.0–0.2)

## 2019-09-12 LAB — BASIC METABOLIC PANEL
Anion gap: 9 (ref 5–15)
BUN: 6 mg/dL (ref 6–20)
CO2: 21 mmol/L — ABNORMAL LOW (ref 22–32)
Calcium: 8.1 mg/dL — ABNORMAL LOW (ref 8.9–10.3)
Chloride: 107 mmol/L (ref 98–111)
Creatinine, Ser: 0.62 mg/dL (ref 0.44–1.00)
GFR calc Af Amer: >60 mL/min (ref >60)
GFR calc non Af Amer: >60 mL/min (ref >60)
Glucose, Bld: 79 mg/dL (ref 70–99)
Potassium: 4.9 mmol/L (ref 3.5–5.1)
Sodium: 137 mmol/L (ref 135–145)

## 2019-09-12 LAB — CULTURE, BLOOD (ROUTINE X 2)
Culture: NO GROWTH
Special Requests: ADEQUATE

## 2019-09-12 LAB — MAGNESIUM: Magnesium: 1.4 mg/dL — ABNORMAL LOW (ref 1.7–2.4)

## 2019-09-12 LAB — GLUCOSE, CAPILLARY
Glucose-Capillary: 104 mg/dL — ABNORMAL HIGH (ref 70–99)
Glucose-Capillary: 91 mg/dL (ref 70–99)
Glucose-Capillary: 109 mg/dL — ABNORMAL HIGH (ref 70–99)

## 2019-09-12 MED ORDER — MAGNESIUM SULFATE 2 GM/50ML IV SOLN
2.0000 g | Freq: Once | INTRAVENOUS | Status: AC
  Administered 2019-09-12: 2 g via INTRAVENOUS
  Filled 2019-09-12: qty 50

## 2019-09-12 NOTE — Progress Notes (Signed)
Subjective: Patient is a 44 year old female with a history of diabetes, schizophrenia, bipolar.  She underwent multiple debridements to the right abdomen and right thigh due to necrotizing fasciitis.  Left abdominal wound was repaired on 07/08/2019.  Right abdominal wound was repaired and meshed prime matrix was applied to the right thigh wound x 2.  Right abdominal wound dehisced x 2.  Patient was assessed in the office on 09/03/2019 where the right leg wound was healing very nicely and filling in with good granulation tissue; left abdominal wound closure was still intact and healing nicely.  She presented to the emergency room and was admitted on 09/07/2019 and was found to have C. Difficile.   Today she is sitting up at the side of her bed eating breakfast.  When asked how she will doing she will just say not good and when probed for further information just say her body does not feel good.  She denies chest pain, shortness of breath, nausea/vomiting. Denies pain of her right thigh/abdomen wound. RN had just finished changing the dressing on her right thigh. Reports it looked nice and pink, cleaner than it was on arrival, drainage has improved.   Objective: Vital signs in last 24 hours: Temp:  [97.9 F (36.6 C)-98.4 F (36.9 C)] 98.1 F (36.7 C) (07/02 0535) Pulse Rate:  [72-76] 76 (07/02 0535) Resp:  [17-18] 17 (07/02 0535) BP: (123-150)/(83-93) 150/83 (07/02 0535) SpO2:  [100 %] 100 % (07/02 0535) Last BM Date: 09/11/19  Intake/Output from previous day: 07/01 0701 - 07/02 0700 In: 120 [P.O.:120] Out: -  Intake/Output this shift: No intake/output data recorded.  General appearance: alert, cooperative and no distress Head: Normocephalic, without obvious abnormality, atraumatic Eyes: EOMs intact Resp: nonlabored Wound:Right thigh wound dressing in place clean and dry.  Patient denies pain of wound.   Lab Results:  CBC    Component Value Date/Time   WBC 7.3 09/12/2019 0228   RBC  3.26 (L) 09/12/2019 0228   HGB 9.6 (L) 09/12/2019 0228   HCT 29.6 (L) 09/12/2019 0228   PLT 437 (H) 09/12/2019 0228   MCV 90.8 09/12/2019 0228   MCH 29.4 09/12/2019 0228   MCHC 32.4 09/12/2019 0228   RDW 17.7 (H) 09/12/2019 0228   LYMPHSABS 1.5 09/10/2019 0841   MONOABS 0.6 09/10/2019 0841   EOSABS 0.1 09/10/2019 0841   BASOSABS 0.0 09/10/2019 0841   BMET Recent Labs    09/11/19 0209 09/12/19 0228  NA 138 137  K 4.7 4.9  CL 107 107  CO2 23 21*  GLUCOSE 89 79  BUN <5* 6  CREATININE 0.68 0.62  CALCIUM 8.5* 8.1*   PT/INR No results for input(s): LABPROT, INR in the last 72 hours. ABG No results for input(s): PHART, HCO3 in the last 72 hours.  Invalid input(s): PCO2, PO2  Studies/Results: CT HEAD WO CONTRAST  Result Date: 09/11/2019 CLINICAL DATA:  Vision loss EXAM: CT HEAD WITHOUT CONTRAST TECHNIQUE: Contiguous axial images were obtained from the base of the skull through the vertex without intravenous contrast. COMPARISON:  06/23/2019 FINDINGS: Brain: There is no acute intracranial hemorrhage, mass effect, or edema. Gray-white differentiation is preserved. There is no extra-axial fluid collection. Ventricles and sulci are within normal limits in size and configuration. Vascular: No hyperdense vessel or unexpected calcification. Skull: Calvarium is unremarkable. Sinuses/Orbits: No acute finding. Other: None. IMPRESSION: No acute intracranial hemorrhage, mass effect, or evidence of acute infarction. Electronically Signed   By: Guadlupe Spanish M.D.   On: 09/11/2019 16:35  Anti-infectives: Anti-infectives (From admission, onward)   Start     Dose/Rate Route Frequency Ordered Stop   09/08/19 1430  vancomycin (VANCOCIN) 50 mg/mL oral solution 125 mg     Discontinue     125 mg Oral 4 times daily 09/08/19 1326 09/18/19 1359   09/08/19 0800  vancomycin (VANCOREADY) IVPB 750 mg/150 mL  Status:  Discontinued        750 mg 150 mL/hr over 60 Minutes Intravenous Every 12 hours 09/07/19  2023 09/08/19 1347   09/08/19 0400  piperacillin-tazobactam (ZOSYN) IVPB 3.375 g  Status:  Discontinued        3.375 g 12.5 mL/hr over 240 Minutes Intravenous Every 8 hours 09/08/19 0046 09/08/19 1629   09/07/19 2030  vancomycin (VANCOREADY) IVPB 2000 mg/400 mL        2,000 mg 200 mL/hr over 120 Minutes Intravenous  Once 09/07/19 1955 09/08/19 0106   09/07/19 1930  piperacillin-tazobactam (ZOSYN) IVPB 3.375 g        3.375 g 100 mL/hr over 30 Minutes Intravenous  Once 09/07/19 1921 09/07/19 2135      Assessment/Plan: s/p  Continue daiy (and PRN) dressing changes as needed to keep right thigh wound dry. Clean and cover with ADB and secure.   Plan to delay skin graft until patient's C.Diff has resolved. Discussed this with patient.   LOS: 4 days    Eldridge Abrahams, PA-C 09/12/2019

## 2019-09-12 NOTE — Progress Notes (Signed)
PROGRESS NOTE    Kimberly Orozco  YIR:[id number] DOB: [birth year] DOA: 09/07/2019 PCP: Patient, No Pcp Per    Brief Narrative:  Patient admitted to the hospital with a working diagnosis of complicated soft tissue infection.  44 year old female with significant past medical history for bipolar disorder, type 2 diabetes mellitus, and schizophrenia.  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  It was complicated by wound dehiscence x2.  Apparently patient had difficulty doing wound changes at home.  She was planned to get a skin graft on July 6.  Patient sustained a mechanical fall and was brought to the hospital, she reported having diarrhea for the last 3 days, having generalized malaise and body stiffness.  On her initial physical examination blood pressure 154/107, heart rate 101, respiratory rate 28, temperature 99.3, oxygen saturation 99%, lungs are clear to auscultation bilaterally, heart S1-S2, present rhythmic, soft abdomen, protuberant.  Open abdominal- right thigh wound, serosanguineous drainage no odor or pus. Sodium 137, potassium 3.1, chloride 106, bicarb 21, glucose 102, BUN less than 5, creatinine 1.0, white count 10.8, hemoglobin 11.1, hematocrit 36.4, platelets 525.  SARS COVID-19 negative.  Urinalysis more than 50 white cells, 21-50 red cells, 21-50 epithelial cells, specific gravity 1.029.  Serum testing positive antigen, negative toxin, positive PCR.  Chest radiograph with no infiltrates. CT of the abdomen and pelvis with skin thickening in the right groin, extending into the anterior thigh.  Soft tissue extends deep to the abductor longus muscle.  No drainable collection.  Assessment & Plan: 1. Acute C diff diarrhea, present on admission.  Cryptosporidium in stool Patient with diarrhea for the last 3 days, progressive and severe. Stool positive for C diff antigen and PCR, negative for toxin. GI  pathogen panel also positive for Cryptosporidium. Discussed with ID currently holding off on treatment for Cryptosporidium given self-limiting nature of the disease. on oral vancomycin Continues to have more than 5 bowel movements in a day. Lab work remarkable is stable. Continue to encourage po intake  Finally diarrhea is improving.  Will monitor.  2. Large right thigh wound Recent necrotizing fasciitis  Plastic surgery consulted. Continue dressing changes for right groin and left groin wound. Personally below the left groin wound which does not appear to be having any evidence of infection for now. Given ongoing C. difficile and high risk for complication and low risk for active infection no antibiotics. Discussed with plastics.  Patient likely will still be receiving treatment for C. difficile colitis while scheduled for surgery on 09/16/2019.  Currently more likely the surgery will be postponed.  Also inform patient's family and patient.  3. T2DM.  Will continue glucose cover and monitoring with insulin sliding scale.   4. Hypokalemia/ hypomagensemia. Likely due to GI losses,  Continue to replace with scheduled potassium and as needed magnesium.  5. Bipolar and schizophrenia. Will resume patient's haldol 10 mg in am and 20 mg in pm.   6. Stage 2 buttock ulcer. Present on admission, continue with local wound care.  Status is: Inpatient  Remains inpatient appropriate because:Inpatient level of care appropriate due to severity of illness   Dispo: The patient is from: Home              Anticipated d/c is to: Home              Anticipated d/c date is: 3 days  Patient currently is not medically stable to d/c.   DVT prophylaxis: Enoxaparin   Code Status:   full  Family Communication:  No family at the bedside    Skin Documentation: Pressure Injury 09/08/19  Stage 2 -  Partial thickness loss of dermis presenting as a shallow open injury with a red, pink wound bed  without slough. (Active)  09/08/19 0604  Location:   Location Orientation:   Staging: Stage 2 -  Partial thickness loss of dermis presenting as a shallow open injury with a red, pink wound bed without slough.  Wound Description (Comments):   Present on Admission: Yes   Consultants:   Plastic surgery    Antimicrobials:   Oral vancomycin     Subjective: Oral intake continues to improve.  Diarrhea frequency improving.  No nausea no vomiting.  Objective: Vitals:   09/11/19 1521 09/11/19 2055 09/12/19 0535 09/12/19 1559  BP: (!) 148/93 123/86 (!) 150/83 124/90  Pulse: 72 73 76 74  Resp: 18 18 17 18   Temp: 97.9 F (36.6 C) 98.4 F (36.9 C) 98.1 F (36.7 C) 98.3 F (36.8 C)  TempSrc: Oral Oral Oral Oral  SpO2: 100% 100% 100% 100%  Weight:        Intake/Output Summary (Last 24 hours) at 09/12/2019 1936 Last data filed at 09/12/2019 1700 Gross per 24 hour  Intake 1300 ml  Output --  Net 1300 ml   Filed Weights   09/08/19 0413  Weight: 98.3 kg    Examination:   General: Not in pain or dyspnea, deconditioned  Neurology: Awake and alert, non focal  E ENT: no pallor, no icterus, oral mucosa moist Cardiovascular: No JVD. S1-S2 present, rhythmic, no gallops, rubs, or murmurs. No lower extremity edema. Pulmonary positive breath sounds bilaterally, adequate air movement, no wheezing, rhonchi or rales. Gastrointestinal. Abdomen with , no organomegaly, non tender, no rebound or guarding Skin. Right thigh with dressing in place.  Musculoskeletal: no joint deformities  Data Reviewed: I have personally reviewed following labs and imaging studies  CBC: Recent Labs  Lab 09/07/19 1636 09/08/19 0025 09/08/19 09/10/19 09/09/19 0219 09/10/19 0841 09/11/19 0209 09/12/19 0228  WBC 10.8*   < > 6.3 5.9 6.6 6.9 7.3  NEUTROABS 8.5*  --   --  3.6 4.4  --   --   HGB 11.1*   < > 9.7* 9.6* 9.9* 10.9* 9.6*  HCT 36.4   < > 31.3* 31.5* 32.7* 35.8* 29.6*  MCV 90.8   < > 90.5 89.0 89.6 89.3  90.8  PLT 525*   < > 426* 438* 425* 513* 437*   < > = values in this interval not displayed.   Basic Metabolic Panel: Recent Labs  Lab 09/08/19 0614 09/09/19 0219 09/10/19 0841 09/11/19 0209 09/12/19 0228  NA 140 139 141 138 137  K 3.5 3.7 4.3 4.7 4.9  CL 110 110 111 107 107  CO2 21* 23 24 23  21*  GLUCOSE 80 88 105* 89 79  BUN <5* <5* <5* <5* 6  CREATININE 0.77 0.68 0.67 0.68 0.62  CALCIUM 7.5* 7.4* 8.0* 8.5* 8.1*  MG 1.5* 1.9 1.6* 1.7 1.4*  PHOS 4.4  --   --   --   --    GFR: Estimated Creatinine Clearance: 108.1 mL/min (by C-G formula based on SCr of 0.62 mg/dL). Liver Function Tests: Recent Labs  Lab 09/05/19 2141 09/07/19 1636 09/08/19 0614 09/10/19 0841  AST 23 18 16  44*  ALT 12 13 11  14  ALKPHOS 145* 153* 137* 124  BILITOT 0.5 0.4 0.4 0.1*  PROT 6.5 6.0* 5.5* 5.8*  ALBUMIN 1.8* 1.6* 1.5* 1.5*   No results for input(s): LIPASE, AMYLASE in the last 168 hours. No results for input(s): AMMONIA in the last 168 hours. Coagulation Profile: Recent Labs  Lab 09/08/19 0614  INR 1.3*   Cardiac Enzymes: Recent Labs  Lab 09/07/19 1636  CKTOTAL 35*   BNP (last 3 results) No results for input(s): PROBNP in the last 8760 hours. HbA1C: No results for input(s): HGBA1C in the last 72 hours. CBG: Recent Labs  Lab 09/11/19 1731 09/11/19 2052 09/12/19 0812 09/12/19 1207 09/12/19 1749  GLUCAP 100* 96 104* 91 109*   Lipid Profile: No results for input(s): CHOL, HDL, LDLCALC, TRIG, CHOLHDL, LDLDIRECT in the last 72 hours. Thyroid Function Tests: No results for input(s): TSH, T4TOTAL, FREET4, T3FREE, THYROIDAB in the last 72 hours. Anemia Panel: No results for input(s): VITAMINB12, FOLATE, FERRITIN, TIBC, IRON, RETICCTPCT in the last 72 hours.    Radiology Studies: I have reviewed all of the imaging during this hospital visit personally     Scheduled Meds: . benztropine  1 mg Oral BID  . divalproex  250 mg Oral Q12H  . haloperidol  10 mg Oral q morning  - 10a  . haloperidol  20 mg Oral QHS  . heparin  5,000 Units Subcutaneous Q8H  . insulin aspart  0-9 Units Subcutaneous TID WC  . potassium chloride  40 mEq Oral BID  . propranolol ER  80 mg Oral Daily  . sodium chloride flush  3 mL Intravenous Once  . sodium chloride flush  3 mL Intravenous Q12H  . vancomycin  125 mg Oral QID   Continuous Infusions:    LOS: 4 days        Lynden Oxford, MD

## 2019-09-12 NOTE — Progress Notes (Signed)
Physical Therapy Treatment Patient Details Name: Kimberly Orozco MRN: [id number] DOB: 15-Mar-1975 Today's Date: 09/12/2019    History of Present Illness Kimberly Orozco is a 44 y.o. female with medical history significant of bipolar disorder, diabetes, schizophrenia, recent history of necrotizing fasciitis affecting abdomen and right thigh in  April s/p multiple debridements. Course complicated by right abdominal wound dehiscence x2. Patient currently is followed by Dr Arita Miss and has plans for skin graft to assist in wound healing on July 6. Patient now presents to ed s/p fall and on evaluation found to have wound infection.    PT Comments    Pt is in bed when PT arrives, and is able to partially sit up before asking for help to get OOB.  PT and pt walked on hallway with standing rests, encouraging her to try and establish her limits.  Follow up with pt for more exercise and gait as tolerated, and continue to feel she will not need follow up care from rehab at home.  Pt is motivated to work, and if therapy does become evident as needed, will recommend her to get outpatient therapy.   Follow Up Recommendations  No PT follow up     Equipment Recommendations  None recommended by PT    Recommendations for Other Services       Precautions / Restrictions Precautions Precautions: Fall Precaution Comments: requires short standing rests on the hall Restrictions Weight Bearing Restrictions: No    Mobility  Bed Mobility Overal bed mobility: Needs Assistance Bed Mobility: Supine to Sit;Sit to Supine     Supine to sit: Min assist Sit to supine: Min assist   General bed mobility comments: pt uses HHA to support getting off bed and to return to bed, with minimal help to bring legs back to bed  Transfers Overall transfer level: Modified independent Equipment used: 1 person hand held assist (for safety) Transfers: Sit to/from Stand Sit to Stand: Min guard             Ambulation/Gait Ambulation/Gait assistance: Min guard Gait Distance (Feet): 220 Feet Assistive device: 1 person hand held assist Gait Pattern/deviations: Step-through pattern;Trunk flexed;Leaning posteriorly Gait velocity: reduced Gait velocity interpretation: <1.31 ft/sec, indicative of household ambulator General Gait Details: did not lose balance but had minor post shift in standing posture during hallway walk   Stairs             Wheelchair Mobility    Modified Rankin (Stroke Patients Only)       Balance Overall balance assessment: Needs assistance Sitting-balance support: Feet supported Sitting balance-Leahy Scale: Fair     Standing balance support: Single extremity supported Standing balance-Leahy Scale: Fair                              Cognition Arousal/Alertness: Awake/alert Behavior During Therapy: Flat affect Overall Cognitive Status: Within Functional Limits for tasks assessed                                 General Comments: Patient is pleasant, agreeable, cooperative      Exercises      General Comments General comments (skin integrity, edema, etc.): pt is up to walk with PT and noted her control of balance is better with cues for rests when she gets tired.      Pertinent Vitals/Pain Pain Assessment: No/denies pain    Home Living  Prior Function            PT Goals (current goals can now be found in the care plan section) Acute Rehab PT Goals Patient Stated Goal: none stated Progress towards PT goals: Progressing toward goals    Frequency    Min 3X/week      PT Plan Current plan remains appropriate    Co-evaluation              AM-PAC PT "6 Clicks" Mobility   Outcome Measure  Help needed turning from your back to your side while in a flat bed without using bedrails?: None Help needed moving from lying on your back to sitting on the side of a flat bed without  using bedrails?: None Help needed moving to and from a bed to a chair (including a wheelchair)?: A Little Help needed standing up from a chair using your arms (e.g., wheelchair or bedside chair)?: A Little Help needed to walk in hospital room?: A Little Help needed climbing 3-5 steps with a railing? : A Little 6 Click Score: 20    End of Session Equipment Utilized During Treatment: Gait belt Activity Tolerance: Patient tolerated treatment well Patient left: in bed;with call bell/phone within reach;with bed alarm set Nurse Communication: Mobility status PT Visit Diagnosis: Muscle weakness (generalized) (M62.81)     Time: 1731-1800 PT Time Calculation (min) (ACUTE ONLY): 29 min  Charges:  $Gait Training: 8-22 mins $Therapeutic Activity: 8-22 mins                    Ivar Drape 09/12/2019, 6:44 PM  Samul Dada, PT MS Acute Rehab Dept. Number: [facility] [id number] and [facility] [phone]

## 2019-09-13 LAB — MAGNESIUM: Magnesium: 1.7 mg/dL (ref 1.7–2.4)

## 2019-09-13 LAB — CBC
HCT: 31.6 % — ABNORMAL LOW (ref 36.0–46.0)
Hemoglobin: 9.6 g/dL — ABNORMAL LOW (ref 12.0–15.0)
MCH: 26.9 pg (ref 26.0–34.0)
MCHC: 30.4 g/dL (ref 30.0–36.0)
MCV: 88.5 fL (ref 80.0–100.0)
Platelets: 364 10*3/uL (ref 150–400)
RBC: 3.57 MIL/uL — ABNORMAL LOW (ref 3.87–5.11)
RDW: 17.3 % — ABNORMAL HIGH (ref 11.5–15.5)
WBC: 5.7 10*3/uL (ref 4.0–10.5)
nRBC: 0 % (ref 0.0–0.2)

## 2019-09-13 LAB — BASIC METABOLIC PANEL
Anion gap: 9 (ref 5–15)
BUN: <5 mg/dL — ABNORMAL LOW (ref 6–20)
CO2: 24 mmol/L (ref 22–32)
Calcium: 8.1 mg/dL — ABNORMAL LOW (ref 8.9–10.3)
Chloride: 103 mmol/L (ref 98–111)
Creatinine, Ser: 0.62 mg/dL (ref 0.44–1.00)
GFR calc Af Amer: >60 mL/min (ref >60)
GFR calc non Af Amer: >60 mL/min (ref >60)
Glucose, Bld: 89 mg/dL (ref 70–99)
Potassium: 4.5 mmol/L (ref 3.5–5.1)
Sodium: 136 mmol/L (ref 135–145)

## 2019-09-13 LAB — GLUCOSE, CAPILLARY
Glucose-Capillary: 85 mg/dL (ref 70–99)
Glucose-Capillary: 103 mg/dL — ABNORMAL HIGH (ref 70–99)
Glucose-Capillary: 104 mg/dL — ABNORMAL HIGH (ref 70–99)
Glucose-Capillary: 98 mg/dL (ref 70–99)

## 2019-09-13 LAB — CULTURE, BLOOD (ROUTINE X 2): Culture: NO GROWTH

## 2019-09-13 NOTE — Progress Notes (Signed)
PROGRESS NOTE    Kimberly Orozco  VVO:[id number] DOB: 19-Mar-1975 DOA: 09/07/2019 PCP: Patient, No Pcp Per    Brief Narrative:  Patient admitted to the hospital with a working diagnosis of complicated soft tissue infection.  44 year old female with significant past medical history for bipolar disorder, type 2 diabetes mellitus, and schizophrenia.  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  It was complicated by wound dehiscence x2.  Apparently patient had difficulty doing wound changes at home.  She was planned to get a skin graft on July 6.  Patient sustained a mechanical fall and was brought to the hospital, she reported having diarrhea for the last 3 days, having generalized malaise and body stiffness.  On her initial physical examination blood pressure 154/107, heart rate 101, respiratory rate 28, temperature 99.3, oxygen saturation 99%, lungs are clear to auscultation bilaterally, heart S1-S2, present rhythmic, soft abdomen, protuberant.  Open abdominal- right thigh wound, serosanguineous drainage no odor or pus. Sodium 137, potassium 3.1, chloride 106, bicarb 21, glucose 102, BUN less than 5, creatinine 1.0, white count 10.8, hemoglobin 11.1, hematocrit 36.4, platelets 525.  SARS COVID-19 negative.  Urinalysis more than 50 white cells, 21-50 red cells, 21-50 epithelial cells, specific gravity 1.029.  Serum testing positive antigen, negative toxin, positive PCR.  Chest radiograph with no infiltrates. CT of the abdomen and pelvis with skin thickening in the right groin, extending into the anterior thigh.  Soft tissue extends deep to the abductor longus muscle.  No drainable collection.  Assessment & Plan: 1. Acute C diff diarrhea, present on admission.  Cryptosporidium in stool Patient with diarrhea for the last 3 days, progressive and severe. Stool positive for C diff antigen and PCR, negative for toxin. GI  pathogen panel also positive for Cryptosporidium. Discussed with ID currently holding off on treatment for Cryptosporidium given self-limiting nature of the disease. on oral vancomycin Continues to report more than 5 bowel movements in a day. Lab work unremarkable is stable. Continue to encourage po intake  Finally diarrhea is improving.  Will monitor.  2. Large right thigh wound Recent necrotizing fasciitis  Plastic surgery consulted. Continue dressing changes for right groin and left groin wound. Personally below the left groin wound which does not appear to be having any evidence of infection for now. Given ongoing C. difficile and high risk for complication and low risk for active infection no antibiotics. Discussed with plastics.  Patient likely will still be receiving treatment for C. difficile colitis while scheduled for surgery on 09/16/2019.  Currently more likely the surgery will be postponed.  Also inform patient's family and patient.  3. T2DM.  Will continue glucose cover and monitoring with insulin sliding scale.   4. Hypokalemia/ hypomagensemia. Likely due to GI losses,  Continue to replace with scheduled potassium and as needed magnesium.  5. Bipolar and schizophrenia. Will resume patient's haldol 10 mg in am and 20 mg in pm.   6. Stage 2 buttock ulcer. Present on admission, continue with local wound care.  Status is: Inpatient  Remains inpatient appropriate because:Inpatient level of care appropriate due to severity of illness   Dispo: The patient is from: Home              Anticipated d/c is to: Home              Anticipated d/c date is: 3 days  Patient currently is not medically stable to d/c.   DVT prophylaxis: Enoxaparin   Code Status:   full  Family Communication:  No family at the bedside    Skin Documentation: Pressure Injury 09/08/19  Stage 2 -  Partial thickness loss of dermis presenting as a shallow open injury with a red, pink wound bed  without slough. (Active)  09/08/19 0604  Location:   Location Orientation:   Staging: Stage 2 -  Partial thickness loss of dermis presenting as a shallow open injury with a red, pink wound bed without slough.  Wound Description (Comments):   Present on Admission: Yes   Consultants:   Plastic surgery    Antimicrobials:   Oral vancomycin     Subjective: Tells me that she had 4 bm today already and no nausea or vomiting.   Objective: Vitals:   09/12/19 0535 09/12/19 1559 09/12/19 2123 09/13/19 0703  BP: (!) 150/83 124/90 122/68 134/81  Pulse: 76 74 81 66  Resp: 17 18 18 18   Temp: 98.1 F (36.7 C) 98.3 F (36.8 C) 98.1 F (36.7 C) 98.2 F (36.8 C)  TempSrc: Oral Oral Oral Oral  SpO2: 100% 100% 99% 100%  Weight:        Intake/Output Summary (Last 24 hours) at 09/13/2019 1857 Last data filed at 09/13/2019 1700 Gross per 24 hour  Intake 1220 ml  Output --  Net 1220 ml   Filed Weights   09/08/19 0413  Weight: 98.3 kg    Examination:   General: Not in pain or dyspnea, deconditioned  Neurology: Awake and alert, non focal  E ENT: no pallor, no icterus, oral mucosa moist Cardiovascular: No JVD. S1-S2 present, rhythmic, no gallops, rubs, or murmurs. No lower extremity edema. Pulmonary positive breath sounds bilaterally, adequate air movement, no wheezing, rhonchi or rales. Gastrointestinal. Abdomen with , no organomegaly, non tender, no rebound or guarding Skin. Right thigh with dressing in place.  Musculoskeletal: no joint deformities  Data Reviewed: I have personally reviewed following labs and imaging studies  CBC: Recent Labs  Lab 09/07/19 1636 09/08/19 0025 09/09/19 0219 09/10/19 0841 09/11/19 0209 09/12/19 0228 09/13/19 0653  WBC 10.8*   < > 5.9 6.6 6.9 7.3 5.7  NEUTROABS 8.5*  --  3.6 4.4  --   --   --   HGB 11.1*   < > 9.6* 9.9* 10.9* 9.6* 9.6*  HCT 36.4   < > 31.5* 32.7* 35.8* 29.6* 31.6*  MCV 90.8   < > 89.0 89.6 89.3 90.8 88.5  PLT 525*   < >  438* 425* 513* 437* 364   < > = values in this interval not displayed.   Basic Metabolic Panel: Recent Labs  Lab 09/08/19 0614 09/08/19 09/10/19 09/09/19 0219 09/10/19 0841 09/11/19 0209 09/12/19 0228 09/13/19 0653  NA 140   < > 139 141 138 137 136  K 3.5   < > 3.7 4.3 4.7 4.9 4.5  CL 110   < > 110 111 107 107 103  CO2 21*   < > 23 24 23  21* 24  GLUCOSE 80   < > 88 105* 89 79 89  BUN <5*   < > <5* <5* <5* 6 <5*  CREATININE 0.77   < > 0.68 0.67 0.68 0.62 0.62  CALCIUM 7.5*   < > 7.4* 8.0* 8.5* 8.1* 8.1*  MG 1.5*   < > 1.9 1.6* 1.7 1.4* 1.7  PHOS 4.4  --   --   --   --   --   --    < > =  values in this interval not displayed.   GFR: Estimated Creatinine Clearance: 108.1 mL/min (by C-G formula based on SCr of 0.62 mg/dL). Liver Function Tests: Recent Labs  Lab 09/07/19 1636 09/08/19 0614 09/10/19 0841  AST 18 16 44*  ALT 13 11 14   ALKPHOS 153* 137* 124  BILITOT 0.4 0.4 0.1*  PROT 6.0* 5.5* 5.8*  ALBUMIN 1.6* 1.5* 1.5*   No results for input(s): LIPASE, AMYLASE in the last 168 hours. No results for input(s): AMMONIA in the last 168 hours. Coagulation Profile: Recent Labs  Lab 09/08/19 0614  INR 1.3*   Cardiac Enzymes: Recent Labs  Lab 09/07/19 1636  CKTOTAL 35*   BNP (last 3 results) No results for input(s): PROBNP in the last 8760 hours. HbA1C: No results for input(s): HGBA1C in the last 72 hours. CBG: Recent Labs  Lab 09/12/19 1207 09/12/19 1749 09/13/19 0708 09/13/19 1131 09/13/19 1655  GLUCAP 91 109* 85 103* 104*   Lipid Profile: No results for input(s): CHOL, HDL, LDLCALC, TRIG, CHOLHDL, LDLDIRECT in the last 72 hours. Thyroid Function Tests: No results for input(s): TSH, T4TOTAL, FREET4, T3FREE, THYROIDAB in the last 72 hours. Anemia Panel: No results for input(s): VITAMINB12, FOLATE, FERRITIN, TIBC, IRON, RETICCTPCT in the last 72 hours.    Radiology Studies: I have reviewed all of the imaging during this hospital visit  personally     Scheduled Meds:  benztropine  1 mg Oral BID   divalproex  250 mg Oral Q12H   haloperidol  10 mg Oral q morning - 10a   haloperidol  20 mg Oral QHS   heparin  5,000 Units Subcutaneous Q8H   insulin aspart  0-9 Units Subcutaneous TID WC   potassium chloride  40 mEq Oral BID   propranolol ER  80 mg Oral Daily   sodium chloride flush  3 mL Intravenous Once   sodium chloride flush  3 mL Intravenous Q12H   vancomycin  125 mg Oral QID   Continuous Infusions:    LOS: 5 days        11/14/19, MD

## 2019-09-14 LAB — CBC
HCT: 33.2 % — ABNORMAL LOW (ref 36.0–46.0)
Hemoglobin: 10 g/dL — ABNORMAL LOW (ref 12.0–15.0)
MCH: 26.5 pg (ref 26.0–34.0)
MCHC: 30.1 g/dL (ref 30.0–36.0)
MCV: 87.8 fL (ref 80.0–100.0)
Platelets: 375 10*3/uL (ref 150–400)
RBC: 3.78 MIL/uL — ABNORMAL LOW (ref 3.87–5.11)
RDW: 17.3 % — ABNORMAL HIGH (ref 11.5–15.5)
WBC: 6.4 10*3/uL (ref 4.0–10.5)
nRBC: 0 % (ref 0.0–0.2)

## 2019-09-14 LAB — BASIC METABOLIC PANEL
Anion gap: 8 (ref 5–15)
BUN: 5 mg/dL — ABNORMAL LOW (ref 6–20)
CO2: 25 mmol/L (ref 22–32)
Calcium: 8.5 mg/dL — ABNORMAL LOW (ref 8.9–10.3)
Chloride: 106 mmol/L (ref 98–111)
Creatinine, Ser: 0.69 mg/dL (ref 0.44–1.00)
GFR calc Af Amer: >60 mL/min (ref >60)
GFR calc non Af Amer: >60 mL/min (ref >60)
Glucose, Bld: 87 mg/dL (ref 70–99)
Potassium: 4.5 mmol/L (ref 3.5–5.1)
Sodium: 139 mmol/L (ref 135–145)

## 2019-09-14 LAB — GLUCOSE, CAPILLARY
Glucose-Capillary: 97 mg/dL (ref 70–99)
Glucose-Capillary: 107 mg/dL — ABNORMAL HIGH (ref 70–99)
Glucose-Capillary: 96 mg/dL (ref 70–99)
Glucose-Capillary: 102 mg/dL — ABNORMAL HIGH (ref 70–99)

## 2019-09-14 LAB — MAGNESIUM: Magnesium: 1.6 mg/dL — ABNORMAL LOW (ref 1.7–2.4)

## 2019-09-14 MED ORDER — MAGNESIUM SULFATE 2 GM/50ML IV SOLN
2.0000 g | Freq: Once | INTRAVENOUS | Status: AC
  Administered 2019-09-14: 2 g via INTRAVENOUS
  Filled 2019-09-14: qty 50

## 2019-09-14 MED ORDER — VANCOMYCIN HCL 125 MG PO CAPS: 125.0000 mg | ORAL_CAPSULE | Freq: Four times a day (QID) | ORAL | 0 refills | Status: AC

## 2019-09-14 MED ORDER — VANCOMYCIN HCL 125 MG PO CAPS: 125.0000 mg | ORAL_CAPSULE | Freq: Four times a day (QID) | ORAL | 0 refills | Status: DC

## 2019-09-14 NOTE — Care Management (Addendum)
Patient states she has no prescription drug coverage and cannot pay for prescription.  She is not eligible for the [facility] program due to having Medicare.  Patient's mother informed that with a GoodRX coupon, the prescription is $150 at [facility] and $40 at [facility]. The patient's mother insists on using [facility] and they will "pay for it if they can".  She is unwilling or unable to understand how to use GoodRX and/or go a [facility].  She states her husband drives her and her daughter and can drive to a HT, but prefers not to.  [facility] open until 5pm.  Pharmacy states the [employer] fund is not available today and patient would not be eligible. Pharmacist suggests patient stay until tomorrow.    TOC supervisor, Kathyrn Sheriff, notified of barrier to d/c.  There is no other option for payment so family will have to pay.  After further discussion with patient's mother, she agrees to go to the [facility] on [address] and will pay cash.  The script is changed to [facility] on [address]  The mother will arrive to transport patient home and will fill script at [facility] today before 5pm.  Good RX coupon printed for patient.

## 2019-09-14 NOTE — Discharge Instructions (Signed)
Continue daiy dressing changes and as needed to keep right thigh wound dry. Clean and cover with ADB and secure.

## 2019-09-14 NOTE — Progress Notes (Addendum)
PROGRESS NOTE    Kimberly Orozco  OMB:[id number] DOB: 1976-01-03 DOA: 09/07/2019 PCP: Patient, No Pcp Per    Brief Narrative:  Patient admitted to the hospital with a working diagnosis of complicated soft tissue infection.  44 year old female with significant past medical history for bipolar disorder, type 2 diabetes mellitus, and schizophrenia.  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  It was complicated by wound dehiscence x2.  Apparently patient had difficulty doing wound changes at home.  She was planned to get a skin graft on July 6.  Patient sustained a mechanical fall and was brought to the hospital, she reported having diarrhea for the last 3 days, having generalized malaise and body stiffness.  On her initial physical examination blood pressure 154/107, heart rate 101, respiratory rate 28, temperature 99.3, oxygen saturation 99%, lungs are clear to auscultation bilaterally, heart S1-S2, present rhythmic, soft abdomen, protuberant.  Open abdominal- right thigh wound, serosanguineous drainage no odor or pus. Sodium 137, potassium 3.1, chloride 106, bicarb 21, glucose 102, BUN less than 5, creatinine 1.0, white count 10.8, hemoglobin 11.1, hematocrit 36.4, platelets 525.  SARS COVID-19 negative.  Urinalysis more than 50 white cells, 21-50 red cells, 21-50 epithelial cells, specific gravity 1.029.  Serum testing positive antigen, negative toxin, positive PCR.  Chest radiograph with no infiltrates. CT of the abdomen and pelvis with skin thickening in the right groin, extending into the anterior thigh.  Soft tissue extends deep to the abductor longus muscle.  No drainable collection.  Assessment & Plan: 1. Acute C diff diarrhea, present on admission.  Cryptosporidium in stool Patient with diarrhea for the last 3 days, progressive and severe. Stool positive for C diff antigen and PCR, negative for toxin. GI  pathogen panel also positive for Cryptosporidium. Discussed with ID currently holding off on treatment for Cryptosporidium given self-limiting nature of the disease. on oral vancomycin Continues to report more than 5 bowel movements in a day. Lab work unremarkable is stable. Continue to encourage po intake  Finally diarrhea is improving. Despite attempts by the [facility] team patient unable to get oral vancomycin today.  Monitor overnight and arrange for safe discharge tomorrow.  2. Large right thigh wound Recent necrotizing fasciitis  Plastic surgery consulted. Continue dressing changes for right groin and left groin wound. Personally below the left groin wound which does not appear to be having any evidence of infection for now. Given ongoing C. difficile and high risk for complication and low risk for active infection no antibiotics. Discussed with plastics.  Patient likely will still be receiving treatment for C. difficile colitis while scheduled for surgery on 09/16/2019.  Currently more likely the surgery will be postponed.  Also inform patient's family and patient. Mother has been informed as well as patient that they will help her learn how to do the dressing changes.  TOC team unable to provide further assistance.    3. T2DM uncontrolled with hyperglycemia Will continue glucose cover and monitoring with insulin sliding scale.   4. Hypokalemia/ hypomagensemia.  Likely due to GI losses,  Continue to replace with scheduled potassium and as needed magnesium.  5. Bipolar and schizophrenia. Continue patient's haldol 10 mg in am and 20 mg in pm.   6. Stage 2 buttock ulcer.  Present on admission, continue with local wound care.  Status is: Inpatient  Remains inpatient appropriate because: Unable to ensure pharmacy availability for oral vancomycin.  Highly critical for  this patient with uncontrolled diabetes and open wounds   Dispo: The patient is from: Home              Anticipated d/c  is to: Home              Anticipated d/c date is: tomorrow               Patient currently is not medically stable to d/c.   DVT prophylaxis: Enoxaparin   Code Status:   full  Family Communication:  No family at the bedside    Skin Documentation: Pressure Injury 09/08/19  Stage 2 -  Partial thickness loss of dermis presenting as a shallow open injury with a red, pink wound bed without slough. (Active)  09/08/19 0604  Location:   Location Orientation:   Staging: Stage 2 -  Partial thickness loss of dermis presenting as a shallow open injury with a red, pink wound bed without slough.  Wound Description (Comments):   Present on Admission: Yes   Consultants:   Plastic surgery    Antimicrobials:   Oral vancomycin     Subjective: Discussed with ENT, patient had to soft BM.  No abdominal pain.  No nausea no vomiting.  2 BM reported for yesterday as well.  Objective: Vitals:   09/13/19 0703 09/13/19 2138 09/14/19 0620 09/14/19 1453  BP: 134/81 128/76 130/82 109/73  Pulse: 66 70 75 74  Resp: 18 18 18 16   Temp: 98.2 F (36.8 C) 98 F (36.7 C) 98.4 F (36.9 C) (!) 97.5 F (36.4 C)  TempSrc: Oral Oral Oral Oral  SpO2: 100% 100% 99% 100%  Weight:        Intake/Output Summary (Last 24 hours) at 09/14/2019 1605 Last data filed at 09/14/2019 0900 Gross per 24 hour  Intake 680 ml  Output --  Net 680 ml   Filed Weights   09/08/19 0413  Weight: 98.3 kg    Examination:   General: Appear in mild distress, no Rash; Oral Mucosa Clear, moist. no Abnormal Neck Mass Or lumps, Conjunctiva normal  Cardiovascular: S1 and S2 Present, no Murmur, Respiratory: good respiratory effort, Bilateral Air entry present and Clear to Auscultation, no Crackles, no wheezes Abdomen: Bowel Sound present, Soft and mild tenderness Extremities: no Pedal edema, no calf tenderness Neurology: alert and oriented to time, place, and person affect appropriate. no new focal deficit Gait not checked due to  patient safety concerns  Wound currently dressed. Please see media for prior wound picture.  Data Reviewed: I have personally reviewed following labs and imaging studies  CBC: Recent Labs  Lab 09/07/19 1636 09/08/19 0025 09/09/19 0219 09/09/19 0219 09/10/19 09/12/19 09/11/19 0209 09/12/19 0228 09/13/19 0653 09/14/19 0437  WBC 10.8*   < > 5.9   < > 6.6 6.9 7.3 5.7 6.4  NEUTROABS 8.5*  --  3.6  --  4.4  --   --   --   --   HGB 11.1*   < > 9.6*   < > 9.9* 10.9* 9.6* 9.6* 10.0*  HCT 36.4   < > 31.5*   < > 32.7* 35.8* 29.6* 31.6* 33.2*  MCV 90.8   < > 89.0   < > 89.6 89.3 90.8 88.5 87.8  PLT 525*   < > 438*   < > 425* 513* 437* 364 375   < > = values in this interval not displayed.   Basic Metabolic Panel: Recent Labs  Lab 09/08/19 [phone] 09/09/19 0219 09/10/19 09/12/19  09/11/19 0209 09/12/19 0228 09/13/19 0653 09/14/19 0437  NA 140   < > 141 138 137 136 139  K 3.5   < > 4.3 4.7 4.9 4.5 4.5  CL 110   < > 111 107 107 103 106  CO2 21*   < > 24 23 21* 24 25  GLUCOSE 80   < > 105* 89 79 89 87  BUN <5*   < > <5* <5* 6 <5* 5*  CREATININE 0.77   < > 0.67 0.68 0.62 0.62 0.69  CALCIUM 7.5*   < > 8.0* 8.5* 8.1* 8.1* 8.5*  MG 1.5*   < > 1.6* 1.7 1.4* 1.7 1.6*  PHOS 4.4  --   --   --   --   --   --    < > = values in this interval not displayed.   GFR: Estimated Creatinine Clearance: 108.1 mL/min (by C-G formula based on SCr of 0.69 mg/dL). Liver Function Tests: Recent Labs  Lab 09/07/19 1636 09/08/19 0614 09/10/19 0841  AST 18 16 44*  ALT 13 11 14   ALKPHOS 153* 137* 124  BILITOT 0.4 0.4 0.1*  PROT 6.0* 5.5* 5.8*  ALBUMIN 1.6* 1.5* 1.5*   No results for input(s): LIPASE, AMYLASE in the last 168 hours. No results for input(s): AMMONIA in the last 168 hours. Coagulation Profile: Recent Labs  Lab 09/08/19 0614  INR 1.3*   Cardiac Enzymes: Recent Labs  Lab 09/07/19 1636  CKTOTAL 35*   BNP (last 3 results) No results for input(s): PROBNP in the last 8760  hours. HbA1C: No results for input(s): HGBA1C in the last 72 hours. CBG: Recent Labs  Lab 09/13/19 1131 09/13/19 1655 09/13/19 2142 09/14/19 0751 09/14/19 1155  GLUCAP 103* 104* 98 97 107*   Lipid Profile: No results for input(s): CHOL, HDL, LDLCALC, TRIG, CHOLHDL, LDLDIRECT in the last 72 hours. Thyroid Function Tests: No results for input(s): TSH, T4TOTAL, FREET4, T3FREE, THYROIDAB in the last 72 hours. Anemia Panel: No results for input(s): VITAMINB12, FOLATE, FERRITIN, TIBC, IRON, RETICCTPCT in the last 72 hours.    Radiology Studies: I have reviewed all of the imaging during this hospital visit personally     Scheduled Meds: . benztropine  1 mg Oral BID  . divalproex  250 mg Oral Q12H  . haloperidol  10 mg Oral q morning - 10a  . haloperidol  20 mg Oral QHS  . heparin  5,000 Units Subcutaneous Q8H  . insulin aspart  0-9 Units Subcutaneous TID WC  . potassium chloride  40 mEq Oral BID  . propranolol ER  80 mg Oral Daily  . sodium chloride flush  3 mL Intravenous Once  . sodium chloride flush  3 mL Intravenous Q12H  . vancomycin  125 mg Oral QID   Continuous Infusions:    LOS: 6 days   11/15/19, MD

## 2019-09-15 LAB — BASIC METABOLIC PANEL
Anion gap: 8 (ref 5–15)
BUN: 9 mg/dL (ref 6–20)
CO2: 25 mmol/L (ref 22–32)
Calcium: 8.1 mg/dL — ABNORMAL LOW (ref 8.9–10.3)
Chloride: 105 mmol/L (ref 98–111)
Creatinine, Ser: 0.78 mg/dL (ref 0.44–1.00)
GFR calc Af Amer: >60 mL/min (ref >60)
GFR calc non Af Amer: >60 mL/min (ref >60)
Glucose, Bld: 91 mg/dL (ref 70–99)
Potassium: 4.3 mmol/L (ref 3.5–5.1)
Sodium: 138 mmol/L (ref 135–145)

## 2019-09-15 LAB — CBC
HCT: 31.4 % — ABNORMAL LOW (ref 36.0–46.0)
Hemoglobin: 9.8 g/dL — ABNORMAL LOW (ref 12.0–15.0)
MCH: 28.1 pg (ref 26.0–34.0)
MCHC: 31.2 g/dL (ref 30.0–36.0)
MCV: 90 fL (ref 80.0–100.0)
Platelets: 373 10*3/uL (ref 150–400)
RBC: 3.49 MIL/uL — ABNORMAL LOW (ref 3.87–5.11)
RDW: 17.7 % — ABNORMAL HIGH (ref 11.5–15.5)
WBC: 6.1 10*3/uL (ref 4.0–10.5)
nRBC: 0 % (ref 0.0–0.2)

## 2019-09-15 LAB — MAGNESIUM: Magnesium: 1.9 mg/dL (ref 1.7–2.4)

## 2019-09-15 LAB — GLUCOSE, CAPILLARY
Glucose-Capillary: 88 mg/dL (ref 70–99)
Glucose-Capillary: 97 mg/dL (ref 70–99)
Glucose-Capillary: 116 mg/dL — ABNORMAL HIGH (ref 70–99)

## 2019-09-15 MED ORDER — POTASSIUM CHLORIDE 20 MEQ PO PACK: 20.0000 meq | PACK | Freq: Two times a day (BID) | ORAL | 0 refills | Status: DC

## 2019-09-15 NOTE — TOC Progression Note (Signed)
Transition of Care [facility]) - Progression Note    Patient Details  Name: Kimberly Orozco MRN: [id number] Date of Birth: May 05, 1975  [facility] CM/SW Contact  Nadene Rubins Adria Devon, RN Phone Number: 09/15/2019, 10:55 AM  Clinical Narrative:     Patient for discharge today. See previous TOC note. NCM confirmed address with patient's mother [name] [phone]. Mother is at home awaiting patient's arrival. Steward Drone wants [facility] transport.  Called [facility] for transport.        Expected Discharge Plan and Services     Discharge Planning Services: CM Consult Post Acute Care Choice: Home Health Living arrangements for the past 2 months: Single Family Home Expected Discharge Date: 09/15/19               DME Arranged: N/A           HH Agency: [facility] Date HH Agency Contacted: 09/09/19 Time HH Agency Contacted: 1627 Representative spoke with at [facility] Agency: [place]   Social Determinants of Health (SDOH) Interventions    Readmission Risk Interventions Readmission Risk Prevention Plan 09/11/2019 07/18/2019  Transportation Screening Complete Complete  PCP or Specialist Appt within 5-7 Days - Not Complete  Not Complete comments - plan for SNF  PCP or Specialist Appt within 3-5 Days (No Data) -  Home Care Screening - Complete  Medication Review (RN CM) - Referral to Pharmacy  HRI or Home Care Consult Complete -  Palliative Care Screening Patient Refused -  Medication Review (RN Care Manager) Referral to Pharmacy -  Some recent data might be hidden

## 2019-09-15 NOTE — Progress Notes (Addendum)
Patient discharged to home. Discharge instructions reviewed with patient. She states she is unable to afford medications. Spoke with case manager and patient is not eligible for assistance and this has been discussed with patient's mother. Medications have been called into the cheaper pharmacy, this was also discussed with the patient.   Dressing supplies also sent home with patient.   AVS printed and provided to [facility] for transport home.

## 2019-09-17 ENCOUNTER — Telehealth: Payer: Self-pay | Admitting: Plastic Surgery
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
## 2019-09-19 ENCOUNTER — Telehealth: Payer: Self-pay

## 2019-09-19 NOTE — Discharge Summary (Signed)
[facility] Discharge Summary   Patient: Kimberly Orozco WGN:[id number]  PCP: Patient, No Pcp Per  Date of admission: 09/07/2019   Date of discharge: 09/15/2019      Discharge Diagnoses:   Principal Problem:   C. difficile diarrhea Active Problems:   Schizophrenia (HCC)   Bipolar disorder (HCC)   Diabetes mellitus type 2 in obese (HCC)   Wound infection   Pressure injury of skin   Admitted From: home Disposition:  Home   Recommendations for Outpatient Follow-up:  1. PCP: please follow up with PCP and plastic surgery  2. Follow up LABS/TEST:  none   Follow-up Information    Allena Napoleon, MD. Schedule an appointment as soon as possible for a visit in 1 week(s).   Specialty: Plastic Surgery Contact information: [address] [place] [address] [phone]        [facility]. Schedule an appointment as soon as possible for a visit.   Why: Please call for an appointment as soon as possible.  There is a pharmacy and Child psychotherapist available at this practice to assist you.  Contact information: [address] [place] [address] [phone]       [facility] Follow up.   Specialty: Internal Medicine Why: This is another option for a primary care doctor if there are no appointments at [facility].  Please call for an appointment as soon as possible.  There is a pharmacy and Child psychotherapist available at this practice to assist you.  Contact information: [address] [id number] mc [place] [address] [phone]       Primary Care at [facility] Follow up.   Specialty: Family Medicine Why: This is another option for a primary care doctor if there are no appointments at [facility].  Please call for an appointment as soon as possible.  There is a pharmacy and Child psychotherapist available at this practice to assist you.  Contact  information: [address] [place] [address] [phone]             Diet recommendation: Cardiac diet  Activity: The patient is advised to gradually reintroduce usual activities, as tolerated  Discharge Condition: stable  Code Status: Full code   History of present illness: As per the H and P dictated on admission, " Kimberly Orozco is a 44 y.o. female with medical history significant of  bipolar disorder, diabetes, schizophrenia, recent history of necrotizing fasciitis affecting abdomen and right thigh in  April s/p multiple debridements. Course complicated by right abdominal wound dehiscence x2. Patient currently is followed by Dr Arita Miss and has plans for skin graft to assist in wound healing on July 6. Patient now presents to ed s/p fall and on evaluation found to have wound infection. Case was discussed with Dr Arita Miss who recommended admission and antibiotic treatment with Plastic consult in am.  Of note patient was seen by plastic onf 09/03/19, ED6/25/21 for wound check and noted to have stable wound on those visits. Of note patient has home health 3 times/ week, however needs wound changes daily. Currently per notes mother is not willing to do wound changes and patient is incapable of completing wound changes herself.  Patient currently states she came to ed after fall today. She states over the last 3-4 days she has had intermittent loose stools, no n/v or fever/chills or abdominal pain. She states  that this am when she woke up she felt stiff and weak and due to this has a fall.  During ed evaluation patient  Xray s/p fall noted no fracture. However there was concern regarding her right abdominal and thigh wound. On evaluation there was concern for wound infection. Dr Arita Miss was consulted who evaluated current picture of the wound and concurred that patient possible had wound infection and recommended admission for IV antibiotics"  Hospital Course:  Summary of  her active problems in the hospital is as following.   1. Acute C diff diarrhea, present on admission.  Cryptosporidium in stool Patient with diarrhea for the last 3 days, progressive and severe. Stool positive for C diff antigen and PCR, negative for toxin. GI pathogen panel also positive for Cryptosporidium. Discussed with ID currently holding off on treatment for Cryptosporidium given self-limiting nature of the disease. on oral vancomycin Continues to report more than 5 bowel movements in a day. Lab work unremarkable is stable. Continue to encourage po intake  Finally diarrhea is improving.  2. Large right thigh wound Recent necrotizing fasciitis  Plastic surgery consulted. Continue dressing changes for right groin and left groin wound. Personally below the left groin wound which does not appear to be having any evidence of infection for now. Given ongoing C. difficile and high risk for complication and low risk for active infection no antibiotics. Discussed with plastics.  Patient likely will still be receiving treatment for C. difficile colitis while scheduled for surgery on 09/16/2019.  Currently more likely the surgery will be postponed.  Also inform patient's family and patient. Mother has been informed as well as patient that they will help her learn how to do the dressing changes.  TOC team unable to provide further assistance.    3. T2DM uncontrolled with hyperglycemia Resume home regimen  4. Hypokalemia/ hypomagensemia.  Likely due to GI losses Continue to replace with scheduled potassium  5. Bipolar and schizophrenia. Continue patient's haldol 10 mg in am and 20 mg in pm.   6. Stage 2 buttock ulcer.  Present on admission, continue with local wound care.  Pressure Injury 09/08/19  Stage 2 -  Partial thickness loss of dermis presenting as a shallow open injury with a red, pink wound bed without slough. (Active)  09/08/19 0604  Location:   Location Orientation:     Staging: Stage 2 -  Partial thickness loss of dermis presenting as a shallow open injury with a red, pink wound bed without slough.  Wound Description (Comments):   Present on Admission: Yes     Patient was seen by physical therapy, who recommended no therapy needed on discharge,  On the day of the discharge the patient's vitals were stable, and no other acute medical condition were reported by patient. the patient was felt safe to be discharge at Home with no therapy needed on discharge.  Consultants: plastic surgery  Procedures: none  Discharge Exam: General: Appear in no distress, no Rash; Oral Mucosa Clear, moist. Cardiovascular: S1 and S2 Present, no Murmur, Respiratory: normal respiratory effort, Bilateral Air entry present and no Crackles, no wheezes Abdomen: Bowel Sound present, Soft and no tenderness, no hernia Extremities: no Pedal edema, no calf tenderness Neurology: alert and oriented to time, place, and person affect appropriate.  Filed Weights   09/08/19 0413  Weight: 98.3 kg   Vitals:   09/14/19 2026 09/15/19 1206  BP: 124/74 114/82  Pulse: 79 74  Resp: 16 20  Temp:  98.4 F (  36.9 C)  SpO2: 100% 100%    DISCHARGE MEDICATION: Allergies as of 09/15/2019   No Known Allergies     Medication List    TAKE these medications   acetaminophen 325 MG tablet Commonly known as: TYLENOL Take 650 mg by mouth every 6 (six) hours as needed for mild pain or headache.   benztropine 1 MG tablet Commonly known as: COGENTIN Take 1 tablet (1 mg total) by mouth 2 (two) times daily.   divalproex 125 MG capsule Commonly known as: DEPAKOTE SPRINKLE Take 2 capsules (250 mg total) by mouth 2 (two) times daily.   haloperidol 10 MG tablet Commonly known as: HALDOL Take 1 tablet (10 mg total) by mouth every morning.   haloperidol 20 MG tablet Commonly known as: HALDOL Take 1 tablet (20 mg total) by mouth at bedtime.   insulin aspart 100 UNIT/ML injection Commonly known  as: novoLOG Inject 4 Units into the skin 3 (three) times daily with meals.   metFORMIN 500 MG 24 hr tablet Commonly known as: GLUCOPHAGE-XR Take 1 tablet (500 mg total) by mouth daily with supper.   potassium chloride 20 MEQ packet Commonly known as: KLOR-CON Take 20 mEq by mouth 2 (two) times daily.   propranolol ER 80 MG 24 hr capsule Commonly known as: Inderal LA Take 1 capsule (80 mg total) by mouth daily.   temazepam 30 MG capsule Commonly known as: RESTORIL Take 1 capsule (30 mg total) by mouth at bedtime.     ASK your doctor about these medications   vancomycin 125 MG capsule Commonly known as: VANCOCIN Take 1 capsule (125 mg total) by mouth 4 (four) times daily for 4 days. Ask about: Should I take this medication?            Discharge Care Instructions  (From admission, onward)         Start     Ordered   09/14/19 0000  Discharge wound care:       Comments: Saline moist gauze dressings to wound, top with dry dressings.  Change every day.   09/14/19 1129         No Known Allergies Discharge Instructions    Diet - low sodium heart healthy   Complete by: As directed    Discharge wound care:   Complete by: As directed    Saline moist gauze dressings to wound, top with dry dressings.  Change every day.   Increase activity slowly   Complete by: As directed       The results of significant diagnostics from this hospitalization (including imaging, microbiology, ancillary and laboratory) are listed below for reference.    Significant Diagnostic Studies: CT ABDOMEN PELVIS WO CONTRAST  Result Date: 09/05/2019 CLINICAL DATA:  Abdominal and thigh pain EXAM: CT ABDOMEN AND PELVIS WITHOUT CONTRAST TECHNIQUE: Multidetector CT imaging of the abdomen and pelvis was performed following the standard protocol without IV contrast. COMPARISON:  07/13/2019 FINDINGS: LOWER CHEST: Normal. HEPATOBILIARY: Normal hepatic contours. No intra- or extrahepatic biliary dilatation.  There is cholelithiasis without acute inflammation. PANCREAS: Normal pancreas. No ductal dilatation or peripancreatic fluid collection. SPLEEN: Normal. ADRENALS/URINARY TRACT: The adrenal glands are normal. No hydronephrosis, nephroureterolithiasis or solid renal mass. The urinary bladder is normal for degree of distention STOMACH/BOWEL: There is no hiatal hernia. Normal duodenal course and caliber. No small bowel dilatation or inflammation. No focal colonic abnormality. Normal appendix. VASCULAR/LYMPHATIC: Normal course and caliber of the major abdominal vessels. No abdominal or pelvic lymphadenopathy. REPRODUCTIVE: Globular uterus  with multiple fibroids. MUSCULOSKELETAL. No osseous abnormality. There is skin thickening of the anterior right thigh extending approximately half the length of the femur. There is an area equal soft tissue density that extends into the deep soft tissues along the adductor longus muscle (series 3, image 12). There is surgical clips near the adductor longus insertion site (series 3, image 128), so this area may have been part of the recent surgery. There is no soft tissue gas. There is a posterior thigh subcutaneous collection measuring 2.7 cm that is likely a sebaceous cyst. OTHER: None. IMPRESSION: 1. Skin thickening of the anterior right thigh extending approximately half the length of the femur, with soft tissue density material extending deep along the adductor longus muscle. It is unclear how much of this is postsurgical change versus active infection. 2. Cholelithiasis without acute inflammation. 3. Fibroid uterus. Electronically Signed   By: Deatra RobinsonKevin  Herman M.D.   On: 09/05/2019 23:36   DG Chest 2 View  Result Date: 09/07/2019 CLINICAL DATA:  Infection EXAM: CHEST - 2 VIEW COMPARISON:  06/23/2019 07/08/2019 FINDINGS: The heart size and mediastinal contours are within normal limits. Both lungs are clear. The visualized skeletal structures are unremarkable. IMPRESSION: No active  cardiopulmonary disease. Electronically Signed   By: Katherine Mantlehristopher  Green M.D.   On: 09/07/2019 17:09   CT HEAD WO CONTRAST  Result Date: 09/11/2019 CLINICAL DATA:  Vision loss EXAM: CT HEAD WITHOUT CONTRAST TECHNIQUE: Contiguous axial images were obtained from the base of the skull through the vertex without intravenous contrast. COMPARISON:  06/23/2019 FINDINGS: Brain: There is no acute intracranial hemorrhage, mass effect, or edema. Gray-white differentiation is preserved. There is no extra-axial fluid collection. Ventricles and sulci are within normal limits in size and configuration. Vascular: No hyperdense vessel or unexpected calcification. Skull: Calvarium is unremarkable. Sinuses/Orbits: No acute finding. Other: None. IMPRESSION: No acute intracranial hemorrhage, mass effect, or evidence of acute infarction. Electronically Signed   By: Guadlupe SpanishPraneil  Ojani Berenson M.D.   On: 09/11/2019 16:35   CT ABDOMEN PELVIS W CONTRAST  Result Date: 09/07/2019 CLINICAL DATA:  Drainage from lower abdominal/groin wound. Assess for deeper infection. EXAM: CT ABDOMEN AND PELVIS WITH CONTRAST TECHNIQUE: Multidetector CT imaging of the abdomen and pelvis was performed using the standard protocol following bolus administration of intravenous contrast. CONTRAST:  100mL OMNIPAQUE IOHEXOL 300 MG/ML  SOLN COMPARISON:  09/05/2019 FINDINGS: Lower chest: Lung bases are clear. No effusions. Heart is normal size. Hepatobiliary: Diffuse fatty infiltration of the liver. No focal abnormality. 2.3 cm gallstone within the gallbladder. Pancreas: No focal abnormality or ductal dilatation. Spleen: No focal abnormality.  Normal size. Adrenals/Urinary Tract: No adrenal abnormality. No focal renal abnormality. No stones or hydronephrosis. Urinary bladder is unremarkable. Stomach/Bowel: Normal appendix. Stomach, large and small bowel grossly unremarkable. Vascular/Lymphatic: No evidence of aneurysm or adenopathy. Reproductive: Enlarged uterus with multiple  fibroids. No adnexal masses. Other: No free fluid or free air. Musculoskeletal: Again noted is skin thickening along the anterior right groin region and upper thigh. Soft tissue extends from the thickened skin to the adductor longus muscle, similar to prior study. No drainable fluid collection. No change since prior study. No acute bony abnormality. IMPRESSION: Continued skin thickening in the right groin region extending into the anterior thigh. The soft tissue extends deep to the adductor longus muscle and is stable since prior study. No drainable focal fluid collection. It is unclear how much of this reflects postsurgical changes/scarring versus infection. Enlarged fibroid uterus. Diffuse fatty infiltration of the liver. Cholelithiasis.  Electronically Signed   By: Charlett Nose M.D.   On: 09/07/2019 22:32   DG Knee Complete 4 Views Left  Result Date: 09/07/2019 CLINICAL DATA:  Pain EXAM: LEFT KNEE - COMPLETE 4+ VIEW COMPARISON:  None. FINDINGS: There is no acute displaced fracture. There is no dislocation. Mild tricompartmental degenerative changes are noted. IMPRESSION: No acute displaced fracture or dislocation. Mild tricompartmental degenerative changes. Electronically Signed   By: Katherine Mantle M.D.   On: 09/07/2019 19:55   DG Knee Complete 4 Views Right  Result Date: 09/07/2019 CLINICAL DATA:  Pain status post fall EXAM: RIGHT KNEE - COMPLETE 4+ VIEW COMPARISON:  None. FINDINGS: Tricompartmental degenerative changes are noted. There is no acute displaced fracture. There is no dislocation. There is nonspecific soft tissue swelling about the lower extremity. IMPRESSION: No acute displaced fracture or dislocation. Tricompartmental degenerative changes are noted. Electronically Signed   By: Katherine Mantle M.D.   On: 09/07/2019 19:51    Microbiology: No results found for this or any previous visit (from the past 240 hour(s)).   Labs: CBC: Recent Labs  Lab 09/13/19 0653 09/14/19 0437  09/15/19 0532  WBC 5.7 6.4 6.1  HGB 9.6* 10.0* 9.8*  HCT 31.6* 33.2* 31.4*  MCV 88.5 87.8 90.0  PLT 364 375 373   Basic Metabolic Panel: Recent Labs  Lab 09/13/19 0653 09/14/19 0437 09/15/19 0242  NA 136 139 138  K 4.5 4.5 4.3  CL 103 106 105  CO2 24 25 25   GLUCOSE 89 87 91  BUN <5* 5* 9  CREATININE 0.62 0.69 0.78  CALCIUM 8.1* 8.5* 8.1*  MG 1.7 1.6* 1.9   Liver Function Tests: No results for input(s): AST, ALT, ALKPHOS, BILITOT, PROT, ALBUMIN in the last 168 hours. No results for input(s): LIPASE, AMYLASE in the last 168 hours. No results for input(s): AMMONIA in the last 168 hours. Cardiac Enzymes: No results for input(s): CKTOTAL, CKMB, CKMBINDEX, TROPONINI in the last 168 hours. BNP (last 3 results) Recent Labs    07/11/19 0500  BNP 144.8*   CBG: Recent Labs  Lab 09/14/19 1155 09/14/19 1719 09/14/19 2025 09/15/19 0742 09/15/19 1144  GLUCAP 107* 96 102* 97 116*    Time spent: 35 minutes  Signed:  11/16/19  [facility] 09/15/2019 8:32 AM

## 2019-09-19 NOTE — Telephone Encounter (Signed)
Received voicemail from nurse case manager, Lanora Manis with [facility] requesting call back to discuss care foanr patient.

## 2019-09-19 NOTE — Telephone Encounter (Signed)
Called to speak with [facility] yesterday 7/8/2, no answer. Did not leave message.

## 2019-09-22 ENCOUNTER — Telehealth: Payer: Self-pay | Admitting: Plastic Surgery

## 2019-09-22 NOTE — Telephone Encounter (Signed)
Lanora Manis from [facility] wants to speak with someone regarding plan of care and give updated report for patient. [phone]

## 2019-09-22 NOTE — Telephone Encounter (Signed)
Elizabeth from [facility] called back and lvm to see if we could cover her prescriptions for the next week because she will not see her primary until Saturday. She is completely out and needs the medication. Please call to advise @ [phone]
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
## 2019-09-23 NOTE — Telephone Encounter (Signed)
See note on (09/22/19).//AB/CMA

## 2019-09-23 NOTE — Telephone Encounter (Signed)
Returned [employer] from [place]. Advised her that we are unable to write prescriptions for a  week of all patients medications including mental health meds. That is out of our scope of practice. Advised her under the direction from [place], our PA, to have her taken to the hospital and for evaluation, or have them write a weeks worth of medication until she is able to meet with her new PC. Mother of patient is aware of situation, and does not comply nor help with her daughter.

## 2019-09-25 ENCOUNTER — Ambulatory Visit (INDEPENDENT_AMBULATORY_CARE_PROVIDER_SITE_OTHER): Payer: Medicare Other | Admitting: Internal Medicine

## 2019-09-25 ENCOUNTER — Other Ambulatory Visit: Payer: Self-pay

## 2019-09-25 ENCOUNTER — Encounter: Payer: Self-pay | Admitting: Internal Medicine

## 2019-09-25 VITALS — BP 127/91 | HR 92 | Ht 67.0 in | Wt 209.9 lb

## 2019-09-25 DIAGNOSIS — F17200 Nicotine dependence, unspecified, uncomplicated: Secondary | ICD-10-CM

## 2019-09-25 DIAGNOSIS — E119 Type 2 diabetes mellitus without complications: Secondary | ICD-10-CM

## 2019-09-25 DIAGNOSIS — F200 Paranoid schizophrenia: Secondary | ICD-10-CM

## 2019-09-25 DIAGNOSIS — E1169 Type 2 diabetes mellitus with other specified complication: Secondary | ICD-10-CM

## 2019-09-25 DIAGNOSIS — E669 Obesity, unspecified: Secondary | ICD-10-CM

## 2019-09-25 DIAGNOSIS — Z6832 Body mass index (BMI) 32.0-32.9, adult: Secondary | ICD-10-CM

## 2019-09-25 DIAGNOSIS — Z794 Long term (current) use of insulin: Secondary | ICD-10-CM

## 2019-09-25 LAB — POCT GLYCOSYLATED HEMOGLOBIN (HGB A1C): Hemoglobin A1C: 4.7 % (ref 4.0–5.6)

## 2019-09-25 LAB — GLUCOSE, CAPILLARY: Glucose-Capillary: 92 mg/dL (ref 70–99)

## 2019-09-25 MED ORDER — BENZTROPINE MESYLATE 1 MG PO TABS: 1.0000 mg | ORAL_TABLET | Freq: Two times a day (BID) | ORAL | 0 refills | Status: DC

## 2019-09-25 MED ORDER — METFORMIN HCL ER 500 MG PO TB24: 500.0000 mg | ORAL_TABLET | Freq: Every day | ORAL | 2 refills | Status: DC

## 2019-09-25 MED ORDER — HALOPERIDOL 10 MG PO TABS: 10.0000 mg | ORAL_TABLET | Freq: Every morning | ORAL | 0 refills | Status: DC

## 2019-09-25 MED ORDER — HALOPERIDOL 20 MG PO TABS: 20.0000 mg | ORAL_TABLET | Freq: Every day | ORAL | 0 refills | Status: DC

## 2019-09-25 NOTE — Patient Instructions (Signed)
Thank you, Kimberly Orozco for allowing [place] to provide your care today. Today we discussed Diabetes, medication management.    I have ordered the following labs for you:   Lab Orders     Microalbumin / Creatinine Urine Ratio     POC Hbg A1C   I will call if any are abnormal. All of your labs can be accessed through "My Chart".  I have place a referrals to Behavioral health for establishing care with psychaitry..    I have ordered the following medication/changed the following medications:  1. continue Metformin 2. continue Haldol 3. begin Cogentin  Please follow-up in in 2 weeks.  Should you have any questions or concerns please call the internal medicine clinic at [phone].    Dellia Cloud, D.O. [facility]   My Chart Access: [email or web address]   If you have not already done so, please get your COVID 19 vaccine  To schedule an appointment for a COVID vaccine choice any of the following: Go to [email or web address]   Go to [email or web address]                  Call [phone]                                     Call [phone] and select Option 2

## 2019-09-26 ENCOUNTER — Encounter: Payer: Self-pay | Admitting: Internal Medicine

## 2019-09-26 LAB — MICROALBUMIN / CREATININE URINE RATIO
Creatinine, Urine: 133 mg/dL
Microalb/Creat Ratio: 13 mg/g creat (ref 0–29)
Microalbumin, Urine: 16.7 ug/mL

## 2019-09-26 NOTE — Assessment & Plan Note (Addendum)
Patient presents today to establish care and has a history of diabetes on metformin 500 mg. She has had two A1c in her chart which are non-congruent as one was 14 and the next was 4.6. I repeated her A1c today and it was 4.7. I will discontinue her metformin at this time. She denies polydipsia, polyuria, nausea, vomiting, fatigue, dizziness, or diaphoresis. This will need to be monitored closely as her antipsychotic medication, haloperidol may be contributing to her change in blood sugar. The patein states that she was previously on aripiprazole when she was in [place], which would coinside with her elevated A1c. This is concering for metabloc syndorme as a side effect of the 2nd generaration antipsychotic, which is likely why her A1c improved once starting haloperidol, which has less metabolic effects. Furthermore, there are case studies showing an association between haloperidol and hypoglycemia, but I think this is less likely.     Plan: - A1c and microalbumin ratio ordered today - Follow up in 2 weeks

## 2019-09-26 NOTE — Addendum Note (Signed)
Addended by: Chari Manning on: 09/26/2019 07:28 AM   Modules accepted: Orders

## 2019-09-26 NOTE — Assessment & Plan Note (Addendum)
Patient has a history of schizophrenia and was previously being treated in [place] and more recently at [facility]. Patient seems to have poor insight into her condition, stating that she does not believe she has schizophrenia, but admits to responding to internal stimuli in the past. She denies having these symptoms currently while taking haloperidol. She is currently taking 10 mg in the AM and 20 mg in the PM. She is not currently under the care of a psychiatrist.   Patient denies hallucinations or delusions, but she dose appears to have a blunted affect, but appears to respond appropriately questions. He speech is organized. I believe she is still experiencing more negative symptoms of schizophrenia at this time.  She denies tremor, rigidity, or myalgias.   Plan: - Continue Haloperidol 10 mg AM and 20 mg PM - Add Benztropine 1 mg BID   - Put in referral for [facility] for psychiatry, she would prefer to go to a different psychiatrist than [facility]. - Follow up in 2 weeks to make sure referrals are in process and she is tolerating the medications.

## 2019-09-26 NOTE — Progress Notes (Signed)
CC: DM  HPI:  [name] is a 44 y.o. female with a past medical history stated below and presents today for DM. Please see problem based assessment and plan for additional details.  Past Medical History:  Diagnosis Date  . Bipolar disorder (HCC)   . Diabetes mellitus without complication (HCC)   . Schizophrenia [facility])     Current Outpatient Medications on File Prior to Visit  Medication Sig Dispense Refill  . acetaminophen (TYLENOL) 325 MG tablet Take 650 mg by mouth every 6 (six) hours as needed for mild pain or headache.    . divalproex (DEPAKOTE SPRINKLE) 125 MG capsule Take 2 capsules (250 mg total) by mouth 2 (two) times daily. (Patient not taking: Reported on 09/04/2019) 60 capsule 2  . insulin aspart (NOVOLOG) 100 UNIT/ML injection Inject 4 Units into the skin 3 (three) times daily with meals. (Patient not taking: Reported on 09/04/2019) 10 mL 11  . potassium chloride (KLOR-CON) 20 MEQ packet Take 20 mEq by mouth 2 (two) times daily. 10 each 0  . propranolol ER (INDERAL LA) 80 MG 24 hr capsule Take 1 capsule (80 mg total) by mouth daily. (Patient not taking: Reported on 09/04/2019) 30 capsule 11  . temazepam (RESTORIL) 30 MG capsule Take 1 capsule (30 mg total) by mouth at bedtime. (Patient not taking: Reported on 09/04/2019) 30 capsule 0   No current facility-administered medications on file prior to visit.    Family History  Family history unknown: Yes    Social History   Socioeconomic History  . Marital status: Single    Spouse name: Not on file  . Number of children: Not on file  . Years of education: Not on file  . Highest education level: Not on file  Occupational History  . Not on file  Tobacco Use  . Smoking status: Current Every Day Smoker    Types: Cigarettes  . Smokeless tobacco: Never Used  . Tobacco comment: 4-5 per day   Vaping Use  . Vaping Use: Never used  Substance and Sexual Activity  . Alcohol use: Not Currently  . Drug use: Never  .  Sexual activity: Not on file  Other Topics Concern  . Not on file  Social History Narrative  . Not on file   Social Determinants of Health   Financial Resource Strain:   . Difficulty of Paying Living Expenses:   Food Insecurity:   . Worried About Programme researcher, broadcasting/film/video in the Last Year:   . Barista in the Last Year:   Transportation Needs:   . Freight forwarder (Medical):   Marland Kitchen Lack of Transportation (Non-Medical):   Physical Activity:   . Days of Exercise per Week:   . Minutes of Exercise per Session:   Stress:   . Feeling of Stress :   Social Connections:   . Frequency of Communication with Friends and Family:   . Frequency of Social Gatherings with Friends and Family:   . Attends Religious Services:   . Active Member of Clubs or Organizations:   . Attends Banker Meetings:   Marland Kitchen Marital Status:   Intimate Partner Violence:   . Fear of Current or Ex-Partner:   . Emotionally Abused:   Marland Kitchen Physically Abused:   . Sexually Abused:     Review of Systems: ROS negative except for what is noted on the assessment and plan.  Vitals:   09/25/19 1348  BP: (!) 127/91  Pulse: 92  SpO2: 100%  Weight: 209 lb 14.4 oz (95.2 kg)  Height: 5\' 7"  (1.702 m)     Physical Exam: Physical Exam Constitutional:      Appearance: Normal appearance. She is obese.  HENT:     Head: Normocephalic and atraumatic.  Cardiovascular:     Rate and Rhythm: Normal rate.     Pulses: Normal pulses.     Heart sounds: Normal heart sounds.  Pulmonary:     Effort: Pulmonary effort is normal.     Breath sounds: Normal breath sounds.  Abdominal:     General: Bowel sounds are normal.     Palpations: Abdomen is soft.     Tenderness: There is no abdominal tenderness.  Musculoskeletal:        General: Normal range of motion.     Right lower leg: No edema.     Left lower leg: No edema.  Skin:    General: Skin is warm and dry.     Findings: Wound (Surgical wound on right thigh)  present.  Neurological:     Mental Status: She is alert and oriented to person, place, and time. Mental status is at baseline.  Psychiatric:        Attention and Perception: Attention normal.        Mood and Affect: Affect is blunt.        Speech: Speech normal.        Behavior: Behavior is withdrawn. Behavior is cooperative.        Thought Content: Thought content normal.      Assessment & Plan:   See Encounters Tab for problem based charting.  Patient discussed with Dr. , D[facility] Internal Medicine, PGY-2 Pager: [phone], Phone: [phone] Date 09/26/2019 Time 6:32 AM

## 2019-09-29 ENCOUNTER — Telehealth: Payer: Self-pay | Admitting: *Deleted

## 2019-09-29 NOTE — Telephone Encounter (Signed)
Received Physician Orders on (09/23/19) via of fax from [facility] requesting signature and return.  Given to provider to sign.    Orders signed and faxed to [facility] on (09/23/19).  Confirmation received.  Copy scanned into the chart.//AB/CMA

## 2019-10-01 ENCOUNTER — Telehealth: Payer: Self-pay | Admitting: Licensed Clinical Social Worker

## 2019-10-01 ENCOUNTER — Encounter: Payer: Self-pay | Admitting: Licensed Clinical Social Worker

## 2019-10-01 NOTE — Addendum Note (Signed)
Addended by: Debe Coder B on: 10/01/2019 10:16 AM   Modules accepted: Level of Service

## 2019-10-01 NOTE — Telephone Encounter (Signed)
Patient was called to connect her with a psychiatrist. I asked the patient if she would like local resources over the phone for her to begin attending a different psychiatrist (per her request to her doctor) other than [facility]. Patient replied with "no". I then asked if she would be willing for me to mail her a list of resources for her to review for a local psychiatrist. Patient did agree. A list of multiple providers was mailed to the patient to day.

## 2019-10-01 NOTE — Progress Notes (Signed)
Internal Medicine Clinic Attending  Case discussed with Dr. Coe  At the time of the visit.  We reviewed the resident's history and exam and pertinent patient test results.  I agree with the assessment, diagnosis, and plan of care documented in the resident's note.  

## 2019-10-03 ENCOUNTER — Other Ambulatory Visit: Payer: Self-pay

## 2019-10-03 ENCOUNTER — Emergency Department (HOSPITAL_COMMUNITY)
Admission: EM | Admit: 2019-10-03 | Discharge: 2019-10-03 | Disposition: A | Discharge status: Home / Self Care | Payer: Medicare Other | Attending: Emergency Medicine | Admitting: Emergency Medicine

## 2019-10-03 ENCOUNTER — Emergency Department (HOSPITAL_COMMUNITY): Payer: Medicare Other

## 2019-10-03 ENCOUNTER — Encounter (HOSPITAL_COMMUNITY): Payer: Self-pay | Admitting: Emergency Medicine

## 2019-10-03 DIAGNOSIS — M549 Dorsalgia, unspecified: Secondary | ICD-10-CM | POA: Diagnosis not present

## 2019-10-03 DIAGNOSIS — M542 Cervicalgia: Secondary | ICD-10-CM | POA: Diagnosis not present

## 2019-10-03 DIAGNOSIS — F17210 Nicotine dependence, cigarettes, uncomplicated: Secondary | ICD-10-CM | POA: Diagnosis not present

## 2019-10-03 DIAGNOSIS — E1110 Type 2 diabetes mellitus with ketoacidosis without coma: Secondary | ICD-10-CM | POA: Insufficient documentation

## 2019-10-03 DIAGNOSIS — R000 Tachycardia, unspecified: Secondary | ICD-10-CM | POA: Diagnosis present

## 2019-10-03 DIAGNOSIS — R197 Diarrhea, unspecified: Secondary | ICD-10-CM | POA: Insufficient documentation

## 2019-10-03 DIAGNOSIS — Z794 Long term (current) use of insulin: Secondary | ICD-10-CM | POA: Insufficient documentation

## 2019-10-03 DIAGNOSIS — E1169 Type 2 diabetes mellitus with other specified complication: Secondary | ICD-10-CM | POA: Insufficient documentation

## 2019-10-03 DIAGNOSIS — R079 Chest pain, unspecified: Secondary | ICD-10-CM

## 2019-10-03 LAB — COMPREHENSIVE METABOLIC PANEL
ALT: 12 U/L (ref 0–44)
AST: 13 U/L — ABNORMAL LOW (ref 15–41)
Albumin: 2.1 g/dL — ABNORMAL LOW (ref 3.5–5.0)
Alkaline Phosphatase: 95 U/L (ref 38–126)
Anion gap: 8 (ref 5–15)
BUN: <5 mg/dL — ABNORMAL LOW (ref 6–20)
CO2: 25 mmol/L (ref 22–32)
Calcium: 8.2 mg/dL — ABNORMAL LOW (ref 8.9–10.3)
Chloride: 104 mmol/L (ref 98–111)
Creatinine, Ser: 0.72 mg/dL (ref 0.44–1.00)
GFR calc Af Amer: >60 mL/min (ref >60)
GFR calc non Af Amer: >60 mL/min (ref >60)
Glucose, Bld: 97 mg/dL (ref 70–99)
Potassium: 3.2 mmol/L — ABNORMAL LOW (ref 3.5–5.1)
Sodium: 137 mmol/L (ref 135–145)
Total Bilirubin: 0.4 mg/dL (ref 0.3–1.2)
Total Protein: 6.5 g/dL (ref 6.5–8.1)

## 2019-10-03 LAB — URINALYSIS, ROUTINE W REFLEX MICROSCOPIC
Bilirubin Urine: NEGATIVE
Glucose, UA: NEGATIVE mg/dL
Ketones, ur: NEGATIVE mg/dL
Nitrite: NEGATIVE
Protein, ur: 30 mg/dL — AB
Specific Gravity, Urine: 1.015 (ref 1.005–1.030)
pH: 6 (ref 5.0–8.0)

## 2019-10-03 LAB — CBC WITH DIFFERENTIAL/PLATELET
Abs Immature Granulocytes: 0.02 10*3/uL (ref 0.00–0.07)
Basophils Absolute: 0.1 10*3/uL (ref 0.0–0.1)
Basophils Relative: 1 %
Eosinophils Absolute: 0.1 10*3/uL (ref 0.0–0.5)
Eosinophils Relative: 1 %
HCT: 37.1 % (ref 36.0–46.0)
Hemoglobin: 11.6 g/dL — ABNORMAL LOW (ref 12.0–15.0)
Immature Granulocytes: 0 %
Lymphocytes Relative: 17 %
Lymphs Abs: 1.6 10*3/uL (ref 0.7–4.0)
MCH: 28.3 pg (ref 26.0–34.0)
MCHC: 31.3 g/dL (ref 30.0–36.0)
MCV: 90.5 fL (ref 80.0–100.0)
Monocytes Absolute: 0.6 10*3/uL (ref 0.1–1.0)
Monocytes Relative: 6 %
Neutro Abs: 7 10*3/uL (ref 1.7–7.7)
Neutrophils Relative %: 75 %
Platelets: 375 10*3/uL (ref 150–400)
RBC: 4.1 MIL/uL (ref 3.87–5.11)
RDW: 16.5 % — ABNORMAL HIGH (ref 11.5–15.5)
WBC: 9.3 10*3/uL (ref 4.0–10.5)
nRBC: 0 % (ref 0.0–0.2)

## 2019-10-03 LAB — TROPONIN I (HIGH SENSITIVITY)
Troponin I (High Sensitivity): 5 ng/L (ref <18)
Troponin I (High Sensitivity): 7 ng/L (ref <18)

## 2019-10-03 LAB — VALPROIC ACID LEVEL: Valproic Acid Lvl: <10 ug/mL — ABNORMAL LOW (ref 50.0–100.0)

## 2019-10-03 LAB — CK: Total CK: 30 U/L — ABNORMAL LOW (ref 38–234)

## 2019-10-03 LAB — LACTIC ACID, PLASMA: Lactic Acid, Venous: 0.9 mmol/L (ref 0.5–1.9)

## 2019-10-03 MED ORDER — LACTATED RINGERS IV BOLUS
1000.0000 mL | Freq: Once | INTRAVENOUS | Status: AC
  Administered 2019-10-03: 1000 mL via INTRAVENOUS

## 2019-10-03 MED ORDER — POTASSIUM CHLORIDE CRYS ER 20 MEQ PO TBCR
40.0000 meq | EXTENDED_RELEASE_TABLET | Freq: Once | ORAL | Status: DC
  Filled 2019-10-03: qty 2

## 2019-10-03 NOTE — Discharge Instructions (Addendum)
Make sure that you are drinking fluids and staying hydrated.  Follow-up with your primary care doctor within a few days for recheck.  Return here as needed for any worsening symptoms.

## 2019-10-03 NOTE — ED Provider Notes (Signed)
Patient care was taken over from Dr. Anitra Lauth.  Patient presented with some chest and back pain along with feeling like her arms and legs were drawing up.  Her EKG does not show any ischemic changes.  She has had 2 - troponins.  She does not have any other symptoms to suggest ACS.  She does not have any current chest pain or back pain.  Doubt other etiologies such as dissection, aneurysm or PE.  Her labs are nonconcerning.  Her Depakote level is negative.  Her electrolytes show a slightly low potassium and she was given a dose of potassium replacement.  She had reported some diarrhea but has not had any diarrhea since she has been in the ED, therefore stool studies could not be sent.  She currently is asymptomatic after IV fluids.  She is anxious to be discharged and does not want to stay any longer.  She is having no ongoing chest pain.  No muscle cramping.  No nausea or vomiting.  She is able to tolerate oral fluids.  She is fully alert and oriented x4.  She was discharged home in good condition.  I did attempt to contact the patient's mother but there was no answer on her contact number.  She was advised to follow-up with her PCP.  Return precautions were given.   Rolan Bucco, MD 10/03/19 1700
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
## 2019-10-03 NOTE — ED Notes (Signed)
PT GIVEN DC INSTRUCTIONS PT VERBALIZES UNDERSTANDING AND PT GIVEN A TAXI TICKET

## 2019-10-03 NOTE — ED Provider Notes (Signed)
[facility] EMERGENCY DEPARTMENT Provider Note   CSN: [id number] Arrival date & time: 10/03/19  1311     History Chief Complaint  Patient presents with  . Tachycardia    Kimberly Orozco is a 44 y.o. female.  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 difficile diarrhea 25 days ago who presents today from home with several complaints.  Patient reported when she woke up this morning she was sweating and felt shaky everywhere like her body had locked up.  She felt like it was difficult to breathe and had pain in her neck and back.  She reports she still feels this way now but it is improved from prior.  Patient reports that they have a small window unit in their home but their home is not air conditioned and has gotten very warm.  She also reports that in the last 2 to 3 days she started having diarrhea again but denies any abdominal pain or vomiting.  Patient also reports that 2 days ago her doctor told her she was no longer diabetic and she stopped taking Metformin.  She denies a cough or fever that she is aware of.  EMS reported that family stated she was slower to respond today than normal but no other specific complaints except what was noted above.  The history is provided by the patient and the EMS personnel.       Past Medical History:  Diagnosis Date  . Bipolar disorder (HCC)   . Diabetes mellitus without complication (HCC)   . Schizophrenia [facility])     Patient Active Problem List   Diagnosis Date Noted  . Wound infection 09/08/2019  . Pressure injury of skin 09/08/2019  . C. difficile diarrhea 09/08/2019  . Open abdominal wall wound 08/04/2019  . Open thigh wound, right, subsequent encounter 08/04/2019  . Leukocytosis     . Septic shock (HCC)   . AKI (acute kidney injury) (HCC)   . Acute respiratory failure (HCC)   . Necrotizing cellulitis   . DKA (diabetic ketoacidoses) (HCC) 06/23/2019  . Diabetes mellitus type 2 in obese (HCC) 04/27/2019  . Bipolar disorder (HCC) 04/26/2019  . Schizophrenia (HCC) 04/25/2019    Past Surgical History:  Procedure Laterality Date  . APPLICATION OF A-CELL OF EXTREMITY N/A 07/08/2019   Procedure: with placement of primatrix AG;  Surgeon: Allena Napoleon, MD;  Location: [facility] OR;  Service: Plastics;  Laterality: N/A;  . APPLICATION OF A-CELL OF EXTREMITY Right 07/22/2019   Procedure: placement of primatrix mesh;  Surgeon: Allena Napoleon, MD;  Location: [facility] OR;  Service: Plastics;  Laterality: Right;  . INCISION AND DRAINAGE OF WOUND N/A 06/26/2019   Procedure: IRRIGATION AND DEBRIDEMENT ABDOMINAL WALL AND GROIN;  Surgeon: Axel Filler, MD;  Location: [facility] OR;  Service: General;  Laterality: N/A;  . INCISION AND DRAINAGE OF WOUND N/A 07/08/2019   Procedure: Debridement of abdominal and groin wound;  Surgeon: Allena Napoleon, MD;  Location: [facility] OR;  Service: Plastics;  Laterality: N/A;  . INCISION AND DRAINAGE PERIRECTAL ABSCESS Right 06/24/2019   Procedure: IRRIGATION AND DEBRIDEMENT RIGHT GROIN AND RIGHT BUTTOCK;  Surgeon: Axel Filler, MD;  Location: [facility] OR;  Service: General;  Laterality: Right;  . IRRIGATION AND DEBRIDEMENT OF WOUND WITH SPLIT  THICKNESS SKIN GRAFT Right 07/22/2019   Procedure: Debridement of right thigh wound;  Surgeon: Allena Napoleon, MD;  Location: [facility] OR;  Service: Plastics;  Laterality: Right;     OB History   No obstetric history on file.     Family History  Family history unknown: Yes    Social History   Tobacco Use  . Smoking status: Current Every Day Smoker    Types: Cigarettes  . Smokeless tobacco: Never Used  . Tobacco comment: 4-5 per day   Vaping Use  . Vaping Use: Never used  Substance Use Topics  . Alcohol use: Not Currently  .  Drug use: Never    Home Medications Prior to Admission medications   Medication Sig Start Date End Date Taking? Authorizing Provider  acetaminophen (TYLENOL) 325 MG tablet Take 650 mg by mouth every 6 (six) hours as needed for mild pain or headache.    [provider]  benztropine (COGENTIN) 1 MG tablet Take 1 tablet (1 mg total) by mouth 2 (two) times daily. 09/25/19 10/25/19  Dellia Cloud, MD  divalproex (DEPAKOTE SPRINKLE) 125 MG capsule Take 2 capsules (250 mg total) by mouth 2 (two) times daily. Patient not taking: Reported on 09/04/2019 05/02/19   Malvin Johns, MD  haloperidol (HALDOL) 10 MG tablet Take 1 tablet (10 mg total) by mouth every morning. 09/25/19   Dellia Cloud, MD  haloperidol (HALDOL) 20 MG tablet Take 1 tablet (20 mg total) by mouth at bedtime. 09/25/19   Dellia Cloud, MD  insulin aspart (NOVOLOG) 100 UNIT/ML injection Inject 4 Units into the skin 3 (three) times daily with meals. Patient not taking: Reported on 09/04/2019 05/02/19   Malvin Johns, MD  potassium chloride (KLOR-CON) 20 MEQ packet Take 20 mEq by mouth 2 (two) times daily. 09/15/19   Rolly Salter, MD  propranolol ER (INDERAL LA) 80 MG 24 hr capsule Take 1 capsule (80 mg total) by mouth daily. Patient not taking: Reported on 09/04/2019 05/02/19 05/01/20  Malvin Johns, MD  temazepam (RESTORIL) 30 MG capsule Take 1 capsule (30 mg total) by mouth at bedtime. Patient not taking: Reported on 09/04/2019 07/24/19   Azucena Fallen, MD    Allergies    Patient has no known allergies.  Review of Systems   Review of Systems  All other systems reviewed and are negative.   Physical Exam Updated Vital Signs BP (!) 138/99   Pulse 90   Temp 98.2 F (36.8 C) (Oral)   Resp (!) 27   Ht 5\' 7"  (1.702 m)   Wt (!) 94.8 kg   SpO2 99%   BMI 32.73 kg/m   Physical Exam Vitals and nursing note reviewed.  Constitutional:      General: She is not in acute distress.    Appearance: She is well-developed. She is  obese.  HENT:     Head: Normocephalic and atraumatic.  Eyes:     Pupils: Pupils are equal, round, and reactive to light.  Neck:     Meningeal: Brudzinski's sign and Kernig's sign absent.     Comments: Mild cervical spinal tenderness Cardiovascular:     Rate and Rhythm: Normal rate and regular rhythm.     Heart sounds: Normal heart sounds. No murmur heard.  No friction rub.  Pulmonary:     Effort: Pulmonary effort is normal.     Breath sounds: Normal breath sounds. No wheezing or rales.  Abdominal:     General: Bowel sounds are normal. There is  no distension.     Palpations: Abdomen is soft.     Tenderness: There is no abdominal tenderness. There is no guarding or rebound.  Musculoskeletal:        General: No tenderness. Normal range of motion.     Cervical back: No muscular tenderness.     Right lower leg: No edema.     Left lower leg: No edema.       Legs:     Comments: No edema  Skin:    General: Skin is warm and dry.     Findings: No rash.  Neurological:     General: No focal deficit present.     Mental Status: She is alert and oriented to person, place, and time.     Cranial Nerves: No cranial nerve deficit.  Psychiatric:        Behavior: Behavior normal.     Comments: Calm and cooperative.  Slightly slow to respond but appropriate     ED Results / Procedures / Treatments   Labs (all labs ordered are listed, but only abnormal results are displayed) Labs Reviewed  CBC WITH DIFFERENTIAL/PLATELET  COMPREHENSIVE METABOLIC PANEL  CK  URINALYSIS, ROUTINE W REFLEX MICROSCOPIC  LACTIC ACID, PLASMA  VALPROIC ACID LEVEL  TROPONIN I (HIGH SENSITIVITY)    EKG None  Radiology No results found.  Procedures Procedures (including critical care time)  Medications Ordered in ED Medications  lactated ringers bolus 1,000 mL (has no administration in time range)    ED Course  I have reviewed the triage vital signs and the nursing notes.  Pertinent labs & imaging  results that were available during my care of the patient were reviewed by me and considered in my medical decision making (see chart for details).    MDM Rules/Calculators/A&P                           44 year old female with multiple medical problems presenting today with vague symptoms of back and neck pain which she states are more chronic, recent recurrence of diarrhea with history of C. difficile 25 days ago and feeling shaky and stiff all over.  Patient is afebrile here and vital signs are reassuring.  Patient's large wound in her right groin crease appears to be healing well without signs of infection today.  Lower suspicion for DKA but patient reports she did recently stop taking the Metformin.  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  EKG has artifact but otherwise appears to be sinus rhythm.  Will check labs and ensure no electrolyte abnormalities or evidence of DKA.  Patient given 1 L of fluid.  Also patient reports her home is not air conditioned.  They do have a small window unit but EMS reported it was very warm inside the home, which could also be a contributing factor.  Will get labs and CXR.   Final Clinical Impression(s) / ED Diagnoses Final diagnoses:  None    Rx / DC Orders ED Discharge Orders    None       Gwyneth Sprout, MD 10/03/19 [phone]

## 2019-10-03 NOTE — ED Triage Notes (Addendum)
Pt here from home with ems. EMS reports tht pt was diaphoretic, tachycardic and had some tachypnea. EMS says almost met sepsis criteria but was borderline. Pt a&ox4 just slow to respond to questions and this is a new finding. Pt does have large wound on right upper leg.

## 2019-10-03 NOTE — TOC CAGE-AID Note (Signed)
Transition of Care [facility]) - CAGE-AID Screening   Patient Details  Name: Kimberly Orozco MRN: [id number] Date of Birth: [birth year]  Transition of Care [facility]) CM/SW Contact:    Michel Bickers, RN Phone Number: 10/03/2019, 4:20 PM   Clinical Narrative:    CAGE-AID Screening:

## 2019-10-07 ENCOUNTER — Emergency Department (HOSPITAL_COMMUNITY)
Admission: EM | Admit: 2019-10-07 | Discharge: 2019-10-07 | Disposition: A | Discharge status: Home / Self Care | Payer: Medicare Other | Source: Home / Self Care | Attending: Emergency Medicine | Admitting: Emergency Medicine

## 2019-10-07 ENCOUNTER — Encounter (HOSPITAL_COMMUNITY): Payer: Self-pay | Admitting: Emergency Medicine

## 2019-10-07 ENCOUNTER — Other Ambulatory Visit: Payer: Self-pay

## 2019-10-07 ENCOUNTER — Emergency Department (HOSPITAL_COMMUNITY)
Admission: EM | Admit: 2019-10-07 | Discharge: 2019-10-07 | Disposition: A | Discharge status: Home / Self Care | Payer: Medicare Other | Attending: Emergency Medicine | Admitting: Emergency Medicine

## 2019-10-07 DIAGNOSIS — F209 Schizophrenia, unspecified: Secondary | ICD-10-CM | POA: Insufficient documentation

## 2019-10-07 DIAGNOSIS — E119 Type 2 diabetes mellitus without complications: Secondary | ICD-10-CM | POA: Insufficient documentation

## 2019-10-07 DIAGNOSIS — Z79899 Other long term (current) drug therapy: Secondary | ICD-10-CM | POA: Insufficient documentation

## 2019-10-07 DIAGNOSIS — F319 Bipolar disorder, unspecified: Secondary | ICD-10-CM | POA: Diagnosis not present

## 2019-10-07 DIAGNOSIS — T434X1A Poisoning by butyrophenone and thiothixene neuroleptics, accidental (unintentional), initial encounter: Secondary | ICD-10-CM | POA: Diagnosis present

## 2019-10-07 DIAGNOSIS — Z5321 Procedure and treatment not carried out due to patient leaving prior to being seen by health care provider: Secondary | ICD-10-CM | POA: Diagnosis not present

## 2019-10-07 DIAGNOSIS — F17210 Nicotine dependence, cigarettes, uncomplicated: Secondary | ICD-10-CM | POA: Diagnosis not present

## 2019-10-07 DIAGNOSIS — T50905A Adverse effect of unspecified drugs, medicaments and biological substances, initial encounter: Secondary | ICD-10-CM

## 2019-10-07 LAB — CBC
HCT: 41 % (ref 36.0–46.0)
Hemoglobin: 12.3 g/dL (ref 12.0–15.0)
MCH: 26.7 pg (ref 26.0–34.0)
MCHC: 30 g/dL (ref 30.0–36.0)
MCV: 88.9 fL (ref 80.0–100.0)
Platelets: 485 10*3/uL — ABNORMAL HIGH (ref 150–400)
RBC: 4.61 MIL/uL (ref 3.87–5.11)
RDW: 16.2 % — ABNORMAL HIGH (ref 11.5–15.5)
WBC: 9.4 10*3/uL (ref 4.0–10.5)
nRBC: 0 % (ref 0.0–0.2)

## 2019-10-07 LAB — COMPREHENSIVE METABOLIC PANEL
ALT: 12 U/L (ref 0–44)
AST: 13 U/L — ABNORMAL LOW (ref 15–41)
Albumin: 2.5 g/dL — ABNORMAL LOW (ref 3.5–5.0)
Alkaline Phosphatase: 101 U/L (ref 38–126)
Anion gap: 9 (ref 5–15)
BUN: <5 mg/dL — ABNORMAL LOW (ref 6–20)
CO2: 26 mmol/L (ref 22–32)
Calcium: 8.5 mg/dL — ABNORMAL LOW (ref 8.9–10.3)
Chloride: 105 mmol/L (ref 98–111)
Creatinine, Ser: 0.87 mg/dL (ref 0.44–1.00)
GFR calc Af Amer: >60 mL/min (ref >60)
GFR calc non Af Amer: >60 mL/min (ref >60)
Glucose, Bld: 95 mg/dL (ref 70–99)
Potassium: 3.1 mmol/L — ABNORMAL LOW (ref 3.5–5.1)
Sodium: 140 mmol/L (ref 135–145)
Total Bilirubin: 0.7 mg/dL (ref 0.3–1.2)
Total Protein: 7.2 g/dL (ref 6.5–8.1)

## 2019-10-07 LAB — CBG MONITORING, ED: Glucose-Capillary: 85 mg/dL (ref 70–99)

## 2019-10-07 MED ORDER — DIPHENHYDRAMINE HCL 25 MG PO CAPS
25.0000 mg | ORAL_CAPSULE | Freq: Once | ORAL | Status: AC
  Administered 2019-10-07: 25 mg via ORAL
  Filled 2019-10-07: qty 1

## 2019-10-07 MED ORDER — SODIUM CHLORIDE 0.9 % IV BOLUS: 500.0000 mL | Freq: Once | INTRAVENOUS | Status: DC

## 2019-10-07 NOTE — ED Provider Notes (Signed)
[facility] EMERGENCY DEPARTMENT Provider Note   CSN: [id number] Arrival date & time: 10/07/19  1208     History Chief Complaint  Patient presents with  . Altered Mental Status    Kimberly Orozco is a 44 y.o. female.  44 year old female with prior medical history detailed below presents for evaluation of "feeling stiff."  Patient is prescribed Haldol.  She reports that over the last 3 or 4 days she has felt intermittent stiffness.  She also reports that she is taking her Haldol in the morning only.  Instead of taking it as prescribed she is taking a total of 30 mg in the a.m.  She was prescribed Haldol -with instructions to take 10 mg in the morning and 20 mg at night.  Otherwise, she is without specific complaint.  She denies fever, nausea, vomiting, chest pain, shortness of breath, headache, vision change, focal weakness.  Of note, patient was at this facility last night for evaluation.  She left prior to full evaluation.  Screening labs were obtained.  No significant findings on labs from earlier this morning..  The history is provided by the patient.  Illness Location:  "Stiff" Severity:  Mild Onset quality:  Gradual Duration:  4 days Timing:  Constant Progression:  Waxing and waning Chronicity:  New Associated symptoms: no chest pain, no fever and no shortness of breath        Past Medical History:  Diagnosis Date  . Bipolar disorder (HCC)   . Diabetes mellitus without complication (HCC)   . Schizophrenia [facility])     Patient Active Problem List   Diagnosis Date Noted  . Wound infection 09/08/2019  . Pressure injury of skin 09/08/2019  . C. difficile diarrhea 09/08/2019  . Open abdominal wall wound 08/04/2019  . Open thigh wound, right, subsequent encounter 08/04/2019  . Leukocytosis   . Septic shock (HCC)   . AKI (acute kidney injury) (HCC)   . Acute respiratory failure (HCC)   . Necrotizing cellulitis   . DKA (diabetic ketoacidoses) (HCC)  06/23/2019  . Diabetes mellitus type 2 in obese (HCC) 04/27/2019  . Bipolar disorder (HCC) 04/26/2019  . Schizophrenia (HCC) 04/25/2019    Past Surgical History:  Procedure Laterality Date  . APPLICATION OF A-CELL OF EXTREMITY N/A 07/08/2019   Procedure: with placement of primatrix AG;  Surgeon: Allena Napoleon, MD;  Location: [facility] OR;  Service: Plastics;  Laterality: N/A;  . APPLICATION OF A-CELL OF EXTREMITY Right 07/22/2019   Procedure: placement of primatrix mesh;  Surgeon: Allena Napoleon, MD;  Location: [facility] OR;  Service: Plastics;  Laterality: Right;  . INCISION AND DRAINAGE OF WOUND N/A 06/26/2019   Procedure: IRRIGATION AND DEBRIDEMENT ABDOMINAL WALL AND GROIN;  Surgeon: Axel Filler, MD;  Location: [facility] OR;  Service: General;  Laterality: N/A;  . INCISION AND DRAINAGE OF WOUND N/A 07/08/2019   Procedure: Debridement of abdominal and groin wound;  Surgeon: Allena Napoleon, MD;  Location: [facility] OR;  Service: Plastics;  Laterality: N/A;  . INCISION AND DRAINAGE PERIRECTAL ABSCESS Right 06/24/2019   Procedure: IRRIGATION AND DEBRIDEMENT RIGHT GROIN AND RIGHT BUTTOCK;  Surgeon: Axel Filler, MD;  Location: [facility] OR;  Service: General;  Laterality: Right;  . IRRIGATION AND DEBRIDEMENT OF WOUND WITH SPLIT THICKNESS SKIN GRAFT Right 07/22/2019   Procedure: Debridement of right thigh wound;  Surgeon: Allena Napoleon, MD;  Location: [facility] OR;  Service: Plastics;  Laterality: Right;     OB History   No obstetric history  on file.     Family History  Family history unknown: Yes    Social History   Tobacco Use  . Smoking status: Current Every Day Smoker    Types: Cigarettes  . Smokeless tobacco: Never Used  . Tobacco comment: 4-5 per day   Vaping Use  . Vaping Use: Never used  Substance Use Topics  . Alcohol use: Not Currently  . Drug use: Never    Home Medications Prior to Admission medications   Medication Sig Start Date End Date Taking? Authorizing Provider  acetaminophen (TYLENOL)  325 MG tablet Take 650 mg by mouth every 6 (six) hours as needed for mild pain or headache.    [provider]  benztropine (COGENTIN) 1 MG tablet Take 1 tablet (1 mg total) by mouth 2 (two) times daily. 09/25/19 10/25/19  Dellia Cloud, MD  haloperidol (HALDOL) 10 MG tablet Take 1 tablet (10 mg total) by mouth every morning. 09/25/19   Dellia Cloud, MD  haloperidol (HALDOL) 20 MG tablet Take 1 tablet (20 mg total) by mouth at bedtime. 09/25/19   Dellia Cloud, MD    Allergies    Patient has no known allergies.  Review of Systems   Review of Systems  Constitutional: Negative for fever.  Respiratory: Negative for shortness of breath.   Cardiovascular: Negative for chest pain.  All other systems reviewed and are negative.   Physical Exam Updated Vital Signs BP (!) 154/106 (BP Location: Left Arm)   Pulse 80   Temp 98.7 F (37.1 C) (Oral)   Resp 15   SpO2 99%   Physical Exam Vitals and nursing note reviewed.  Constitutional:      General: She is not in acute distress.    Appearance: Normal appearance. She is well-developed.  HENT:     Head: Normocephalic and atraumatic.  Eyes:     Conjunctiva/sclera: Conjunctivae normal.     Pupils: Pupils are equal, round, and reactive to light.  Cardiovascular:     Rate and Rhythm: Normal rate and regular rhythm.     Heart sounds: Normal heart sounds.  Pulmonary:     Effort: Pulmonary effort is normal. No respiratory distress.     Breath sounds: Normal breath sounds.  Abdominal:     General: There is no distension.     Palpations: Abdomen is soft.     Tenderness: There is no abdominal tenderness.  Musculoskeletal:        General: No deformity. Normal range of motion.     Cervical back: Normal range of motion and neck supple.  Skin:    General: Skin is warm and dry.  Neurological:     General: No focal deficit present.     Mental Status: She is alert and oriented to person, place, and time. Mental status is at baseline.      ED Results / Procedures / Treatments   Labs (all labs ordered are listed, but only abnormal results are displayed) Labs Reviewed  CBG MONITORING, ED    EKG None  Radiology No results found.  Procedures Procedures (including critical care time)  Medications Ordered in ED Medications  diphenhydrAMINE (BENADRYL) capsule 25 mg (has no administration in time range)  sodium chloride 0.9 % bolus 500 mL (has no administration in time range)    ED Course  I have reviewed the triage vital signs and the nursing notes.  Pertinent labs & imaging results that were available during my care of the patient were reviewed by me and  considered in my medical decision making (see chart for details).    MDM Rules/Calculators/A&P                          MDM  Screen complete  Linzy Laury was evaluated in Emergency Department on 10/07/2019 for the symptoms described in the history of present illness. She was evaluated in the context of the global COVID-19 pandemic, which necessitated consideration that the patient might be at risk for infection with the SARS-CoV-2 virus that causes COVID-19. Institutional protocols and algorithms that pertain to the evaluation of patients at risk for COVID-19 are in a state of rapid change based on information released by regulatory bodies including the CDC and federal and state organizations. These policies and algorithms were followed during the patient's care in the ED.  Patient is complaining of feeling stiff.  Patient does admit to taking her Haldol on a daily basis instead of twice a day.  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  Patient does feel improved after ED evaluation.  She strongly advised to take her haldol as prescribed on a BID basis.  Importance of close follow-up was stressed.  Strict return precautions given and understood.   Final Clinical Impression(s) / ED Diagnoses Final  diagnoses:  Adverse effect of drug, initial encounter    Rx / DC Orders ED Discharge Orders    None       Wynetta Fines, MD 10/07/19 [phone]

## 2019-10-07 NOTE — ED Triage Notes (Signed)
Pt arrives via [facility] with c/o of being more letherigc and not herself for the last 2-3 days per caretaker, pt was bought here last night around 2 am but was unable to stay so caregiver called ems today for pt to be seen. MD ordered labs and EKG last night which are all still in chart, will not repeat these today pt will see provider when room available. Pt is alert and ox4, does appear lethargic

## 2019-10-07 NOTE — Discharge Instructions (Addendum)
Please take Haldol as prescribed.  Taking it inappropriately will cause significant side effects.  Please return for any problem.

## 2019-10-07 NOTE — ED Notes (Signed)
Attempted IV start 2x.

## 2019-10-07 NOTE — ED Notes (Signed)
Pt family stated she will return in the morning, she can no longer wait. LWBS

## 2019-10-07 NOTE — ED Notes (Signed)
Steward Drone, mother, [phone] would like a callback when available

## 2019-10-07 NOTE — ED Triage Notes (Signed)
Pt brought in by her caregiver who reports pt has been taking her haldol incorrectly. Pt prescribed 10mg  in the am and 20mg  pm, caregiver reports pt has been taking both doses together, and twice a day x 3-4 days. Pt alert. Verbal order for labs from Dr. .

## 2019-10-08 ENCOUNTER — Ambulatory Visit: Payer: Medicare Other | Admitting: Surgical

## 2019-10-08 ENCOUNTER — Other Ambulatory Visit: Payer: Self-pay

## 2019-10-08 ENCOUNTER — Telehealth: Payer: Self-pay | Admitting: *Deleted

## 2019-10-08 ENCOUNTER — Emergency Department (HOSPITAL_COMMUNITY)
Admission: EM | Admit: 2019-10-08 | Discharge: 2019-10-08 | Disposition: A | Discharge status: Home / Self Care | Payer: Medicare Other | Attending: Emergency Medicine | Admitting: Emergency Medicine

## 2019-10-08 ENCOUNTER — Encounter (HOSPITAL_COMMUNITY): Payer: Self-pay

## 2019-10-08 DIAGNOSIS — F17210 Nicotine dependence, cigarettes, uncomplicated: Secondary | ICD-10-CM | POA: Insufficient documentation

## 2019-10-08 DIAGNOSIS — M545 Low back pain: Secondary | ICD-10-CM | POA: Diagnosis not present

## 2019-10-08 DIAGNOSIS — Z4889 Encounter for other specified surgical aftercare: Secondary | ICD-10-CM | POA: Diagnosis not present

## 2019-10-08 DIAGNOSIS — F209 Schizophrenia, unspecified: Secondary | ICD-10-CM | POA: Insufficient documentation

## 2019-10-08 DIAGNOSIS — T50905D Adverse effect of unspecified drugs, medicaments and biological substances, subsequent encounter: Secondary | ICD-10-CM

## 2019-10-08 DIAGNOSIS — Z79899 Other long term (current) drug therapy: Secondary | ICD-10-CM | POA: Diagnosis not present

## 2019-10-08 DIAGNOSIS — G2409 Other drug induced dystonia: Secondary | ICD-10-CM | POA: Diagnosis not present

## 2019-10-08 DIAGNOSIS — E1110 Type 2 diabetes mellitus with ketoacidosis without coma: Secondary | ICD-10-CM | POA: Diagnosis not present

## 2019-10-08 DIAGNOSIS — T43595D Adverse effect of other antipsychotics and neuroleptics, subsequent encounter: Secondary | ICD-10-CM | POA: Diagnosis not present

## 2019-10-08 LAB — CBC WITH DIFFERENTIAL/PLATELET
Abs Immature Granulocytes: 0.03 10*3/uL (ref 0.00–0.07)
Basophils Absolute: 0 10*3/uL (ref 0.0–0.1)
Basophils Relative: 0 %
Eosinophils Absolute: 0.1 10*3/uL (ref 0.0–0.5)
Eosinophils Relative: 1 %
HCT: 41.6 % (ref 36.0–46.0)
Hemoglobin: 12.5 g/dL (ref 12.0–15.0)
Immature Granulocytes: 0 %
Lymphocytes Relative: 23 %
Lymphs Abs: 2.1 10*3/uL (ref 0.7–4.0)
MCH: 26.8 pg (ref 26.0–34.0)
MCHC: 30 g/dL (ref 30.0–36.0)
MCV: 89.3 fL (ref 80.0–100.0)
Monocytes Absolute: 0.6 10*3/uL (ref 0.1–1.0)
Monocytes Relative: 6 %
Neutro Abs: 6.3 10*3/uL (ref 1.7–7.7)
Neutrophils Relative %: 70 %
Platelets: 380 10*3/uL (ref 150–400)
RBC: 4.66 MIL/uL (ref 3.87–5.11)
RDW: 16.2 % — ABNORMAL HIGH (ref 11.5–15.5)
WBC: 9.1 10*3/uL (ref 4.0–10.5)
nRBC: 0 % (ref 0.0–0.2)

## 2019-10-08 LAB — COMPREHENSIVE METABOLIC PANEL
ALT: 11 U/L (ref 0–44)
AST: 19 U/L (ref 15–41)
Albumin: 2.8 g/dL — ABNORMAL LOW (ref 3.5–5.0)
Alkaline Phosphatase: 103 U/L (ref 38–126)
Anion gap: 11 (ref 5–15)
BUN: 6 mg/dL (ref 6–20)
CO2: 27 mmol/L (ref 22–32)
Calcium: 8.8 mg/dL — ABNORMAL LOW (ref 8.9–10.3)
Chloride: 102 mmol/L (ref 98–111)
Creatinine, Ser: 0.64 mg/dL (ref 0.44–1.00)
GFR calc Af Amer: >60 mL/min (ref >60)
GFR calc non Af Amer: >60 mL/min (ref >60)
Glucose, Bld: 91 mg/dL (ref 70–99)
Potassium: 3.8 mmol/L (ref 3.5–5.1)
Sodium: 140 mmol/L (ref 135–145)
Total Bilirubin: 0.4 mg/dL (ref 0.3–1.2)
Total Protein: 7.6 g/dL (ref 6.5–8.1)

## 2019-10-08 LAB — URINALYSIS, ROUTINE W REFLEX MICROSCOPIC
Bilirubin Urine: NEGATIVE
Glucose, UA: NEGATIVE mg/dL
Ketones, ur: NEGATIVE mg/dL
Nitrite: NEGATIVE
Protein, ur: 30 mg/dL — AB
Specific Gravity, Urine: 1.023 (ref 1.005–1.030)
pH: 6 (ref 5.0–8.0)

## 2019-10-08 LAB — RAPID URINE DRUG SCREEN, HOSP PERFORMED
Amphetamines: NOT DETECTED
Barbiturates: NOT DETECTED
Benzodiazepines: NOT DETECTED
Cocaine: NOT DETECTED
Opiates: NOT DETECTED
Tetrahydrocannabinol: NOT DETECTED

## 2019-10-08 LAB — CBG MONITORING, ED: Glucose-Capillary: 93 mg/dL (ref 70–99)

## 2019-10-08 LAB — CK: Total CK: 52 U/L (ref 38–234)

## 2019-10-08 MED ORDER — DIPHENHYDRAMINE HCL 50 MG/ML IJ SOLN
25.0000 mg | Freq: Once | INTRAMUSCULAR | Status: AC
  Administered 2019-10-08: 25 mg via INTRAVENOUS
  Filled 2019-10-08: qty 1

## 2019-10-08 NOTE — Medical Student Note (Signed)
[facility]-EMERGENCY DEPT Provider Student Note For educational purposes for Medical, PA and NP students only and not part of the legal medical record.   CSN: [id number] Arrival date & time: 10/08/19  1316      History   Chief Complaint Chief Complaint  Patient presents with  . Fall  . Took too much medication    HPI Kimberly Orozco is a 44 y.o. female with hx of BPD and schizophrenia taking Haldol and Cogentin.Pt is here complaining of difficulty moving her arms bilaterally. She describes them as stiff and painful. She was here yesterday for complaints of dystonia due to taking her Haldol inappropriately. She has been taking it for 1 week and was taking 30mg  QD instead of 10mg  in the AM and 20mg  in the PM as prescriped. She was discharged last night at [facility] and reports that she did not take her medication. This morning she took 1 pill as directed. She says that the stiffness is worse than yesterday. She had a fall and landed on her bottom. She admits to some low back pain.   Her mother would also like to look at her surgical wound. She's scheduled for surgical debrediment. Has had home health coming to dress the wound 3x a week.   Sleep is poor, reports laying awake. Appetite is suppressed. She ate breakfast this morning. Didn't eat lunch.   Mother reports that patient has been on the medication for a couple of months but now her psych symptoms are worsening. Pt doesn't see a psych.   Pt denies hallucinations or delusions. No SI or HI.   Pt's mother reports that she just sits in her room doing nothing. Pt admits to laying down most of the day.    HPI  Past Medical History:  Diagnosis Date  . Bipolar disorder (HCC)   . Diabetes mellitus without complication (HCC)   . Schizophrenia [facility])     Patient Active Problem List   Diagnosis Date Noted  . Wound infection 09/08/2019  . Pressure injury of skin 09/08/2019  . C. difficile diarrhea 09/08/2019  . Open abdominal wall wound  08/04/2019  . Open thigh wound, right, subsequent encounter 08/04/2019  . Leukocytosis   . Septic shock (HCC)   . AKI (acute kidney injury) (HCC)   . Acute respiratory failure (HCC)   . Necrotizing cellulitis   . DKA (diabetic ketoacidoses) (HCC) 06/23/2019  . Diabetes mellitus type 2 in obese (HCC) 04/27/2019  . Bipolar disorder (HCC) 04/26/2019  . Schizophrenia (HCC) 04/25/2019    Past Surgical History:  Procedure Laterality Date  . APPLICATION OF A-CELL OF EXTREMITY N/A 07/08/2019   Procedure: with placement of primatrix AG;  Surgeon: 04/28/2019, MD;  Location: [facility] OR;  Service: Plastics;  Laterality: N/A;  . APPLICATION OF A-CELL OF EXTREMITY Right 07/22/2019   Procedure: placement of primatrix mesh;  Surgeon: 07/10/2019, MD;  Location: [facility] OR;  Service: Plastics;  Laterality: Right;  . INCISION AND DRAINAGE OF WOUND N/A 06/26/2019   Procedure: IRRIGATION AND DEBRIDEMENT ABDOMINAL WALL AND GROIN;  Surgeon: 09/21/2019, MD;  Location: [facility] OR;  Service: General;  Laterality: N/A;  . INCISION AND DRAINAGE OF WOUND N/A 07/08/2019   Procedure: Debridement of abdominal and groin wound;  Surgeon: Axel Filler, MD;  Location: [facility] OR;  Service: Plastics;  Laterality: N/A;  . INCISION AND DRAINAGE PERIRECTAL ABSCESS Right 06/24/2019   Procedure: IRRIGATION AND DEBRIDEMENT RIGHT GROIN AND RIGHT BUTTOCK;  Surgeon: 07/10/2019,  MD;  Location: [facility] OR;  Service: General;  Laterality: Right;  . IRRIGATION AND DEBRIDEMENT OF WOUND WITH SPLIT THICKNESS SKIN GRAFT Right 07/22/2019   Procedure: Debridement of right thigh wound;  Surgeon: Allena Napoleon, MD;  Location: [facility] OR;  Service: Plastics;  Laterality: Right;    OB History   No obstetric history on file.      Home Medications    Prior to Admission medications   Medication Sig Start Date End Date Taking? Authorizing Provider  acetaminophen (TYLENOL) 325 MG tablet Take 650 mg by mouth every 6 (six) hours as needed for mild pain  or headache.    [provider]  benztropine (COGENTIN) 1 MG tablet Take 1 tablet (1 mg total) by mouth 2 (two) times daily. 09/25/19 10/25/19  Dellia Cloud, MD  haloperidol (HALDOL) 10 MG tablet Take 1 tablet (10 mg total) by mouth every morning. 09/25/19   Dellia Cloud, MD  haloperidol (HALDOL) 20 MG tablet Take 1 tablet (20 mg total) by mouth at bedtime. 09/25/19   Dellia Cloud, MD    Family History Family History  Family history unknown: Yes    Social History Social History   Tobacco Use  . Smoking status: Current Every Day Smoker    Types: Cigarettes  . Smokeless tobacco: Never Used  . Tobacco comment: 4-5 per day   Vaping Use  . Vaping Use: Never used  Substance Use Topics  . Alcohol use: Not Currently  . Drug use: Never     Allergies   Patient has no known allergies.   Review of Systems Review of Systems   Physical Exam Updated Vital Signs BP (!) 177/122   Pulse 104   Temp 99.2 F (37.3 C) (Oral)   Resp 18   LMP 08/25/2019   SpO2 98%   Physical Exam Pt's mood is subdued. Movement and speech are slowed. Sensation is intact.   ED Treatments / Results  Labs (all labs ordered are listed, but only abnormal results are displayed) Labs Reviewed - No data to display  EKG  Radiology No results found.  Procedures Procedures (including critical care time)  Medications Ordered in ED Medications - No data to display   Initial Impression / Assessment and Plan / ED Course  I have reviewed the triage vital signs and the nursing notes.  Pertinent labs & imaging results that were available during my care of the patient were reviewed by me and considered in my medical decision making (see chart for details).    Final Clinical Impressions(s) / ED Diagnoses   Final diagnoses:  None    New Prescriptions New Prescriptions   No medications on file

## 2019-10-08 NOTE — Telephone Encounter (Signed)
Thanks for the update. Patient is scheduled to see me tomorrow afternoon.

## 2019-10-08 NOTE — Telephone Encounter (Signed)
Pt's mother calls and states she has had pt in [place] and they did not address the mental health problem. Mother states for days pt has been getting worse, she is hallucinating seeing the devil and demons and also saying she cant move her body but at the same time moving everywhere.  Mother is in tears and very worried. States she thinks the medicine adjustment caused this. She is referred to [facility] ED for the mental health services.

## 2019-10-08 NOTE — ED Provider Notes (Signed)
[facility]-EMERGENCY DEPT Provider Note   CSN: [id number] Arrival date & time: 10/08/19  1316     History Chief Complaint  Patient presents with  . Fall  . Took too much medication    Kimberly Orozco is a 44 y.o. female.  44 year old female returns to the ER, taking Haldol for schizophrenia, mom states previously on an injectable while living in [place] and states that was working much better for her.  States patient is not sleeping at night and is hallucinating, complaining about seeing demons, patient agrees with this and states it was a demon on her earlier.  Denies suicidal or homicidal ideation.. Started Haldol a few months ago, was doing well until recently when she was diagnosed with dystonia and realized she was taking her medication incorrectly- 30mg  QAM instead of 10mg  QAM and 20mg  QPM. Patient reports arm weakness today, no leg weakness. Patient reports fall landing on buttocks earlier with complaint of mild low back pain. Patient was discharged last night, did not take her night time dose, took prescribed 20mg  dose this morning. Mother is in the department today, concerned medication has blunted affect, is struggling with ADLs secondary to muscle stiffness. Patient was started on Haldol as prescribed at [facility] ER, does not have a regular prescriber for this.  Also right thigh wound, admitted to the hospital 4/12-5/13/21 for DKA in the setting of necrotizing fasciitis to the right thigh which was surgically debrided.  Patient has been following with plastic surgery, mom states was scheduled for debridement in July however this was rescheduled to August.  Patient has home health which comes in 3 days a week however patient has missed her appointments lately due to her ER visits and mom states she is unable to care for the wound.        Past Medical History:  Diagnosis Date  . Bipolar disorder (HCC)   . Diabetes mellitus without complication (HCC)   .  Schizophrenia [facility])     Patient Active Problem List   Diagnosis Date Noted  . Wound infection 09/08/2019  . Pressure injury of skin 09/08/2019  . C. difficile diarrhea 09/08/2019  . Open abdominal wall wound 08/04/2019  . Open thigh wound, right, subsequent encounter 08/04/2019  . Leukocytosis   . Septic shock (HCC)   . AKI (acute kidney injury) (HCC)   . Acute respiratory failure (HCC)   . Necrotizing cellulitis   . DKA (diabetic ketoacidoses) (HCC) 06/23/2019  . Diabetes mellitus type 2 in obese (HCC) 04/27/2019  . Bipolar disorder (HCC) 04/26/2019  . Schizophrenia (HCC) 04/25/2019    Past Surgical History:  Procedure Laterality Date  . APPLICATION OF A-CELL OF EXTREMITY N/A 07/08/2019   Procedure: with placement of primatrix AG;  Surgeon: 04/29/2019, MD;  Location: [facility] OR;  Service: Plastics;  Laterality: N/A;  . APPLICATION OF A-CELL OF EXTREMITY Right 07/22/2019   Procedure: placement of primatrix mesh;  Surgeon: 06/23/2019, MD;  Location: [facility] OR;  Service: Plastics;  Laterality: Right;  . INCISION AND DRAINAGE OF WOUND N/A 06/26/2019   Procedure: IRRIGATION AND DEBRIDEMENT ABDOMINAL WALL AND GROIN;  Surgeon: Allena Napoleon, MD;  Location: [facility] OR;  Service: General;  Laterality: N/A;  . INCISION AND DRAINAGE OF WOUND N/A 07/08/2019   Procedure: Debridement of abdominal and groin wound;  Surgeon: 06/28/2019, MD;  Location: [facility] OR;  Service: Plastics;  Laterality: N/A;  . INCISION AND DRAINAGE PERIRECTAL ABSCESS Right 06/24/2019  Procedure: IRRIGATION AND DEBRIDEMENT RIGHT GROIN AND RIGHT BUTTOCK;  Surgeon: Axel Filler, MD;  Location: [facility] OR;  Service: General;  Laterality: Right;  . IRRIGATION AND DEBRIDEMENT OF WOUND WITH SPLIT THICKNESS SKIN GRAFT Right 07/22/2019   Procedure: Debridement of right thigh wound;  Surgeon: Allena Napoleon, MD;  Location: [facility] OR;  Service: Plastics;  Laterality: Right;     OB History   No obstetric history on file.     Family  History  Family history unknown: Yes    Social History   Tobacco Use  . Smoking status: Current Every Day Smoker    Types: Cigarettes  . Smokeless tobacco: Never Used  . Tobacco comment: 4-5 per day   Vaping Use  . Vaping Use: Never used  Substance Use Topics  . Alcohol use: Not Currently  . Drug use: Never    Home Medications Prior to Admission medications   Medication Sig Start Date End Date Taking? Authorizing Provider  acetaminophen (TYLENOL) 325 MG tablet Take 650 mg by mouth every 6 (six) hours as needed for mild pain or headache.   Yes [provider]  benztropine (COGENTIN) 1 MG tablet Take 1 tablet (1 mg total) by mouth 2 (two) times daily. 09/25/19 10/25/19 Yes Dellia Cloud, MD  haloperidol (HALDOL) 10 MG tablet Take 1 tablet (10 mg total) by mouth every morning. 09/25/19  Yes Dellia Cloud, MD  haloperidol (HALDOL) 20 MG tablet Take 1 tablet (20 mg total) by mouth at bedtime. 09/25/19  Yes Dellia Cloud, MD    Allergies    Patient has no known allergies.  Review of Systems   Review of Systems  Unable to perform ROS: Psychiatric disorder  Constitutional: Negative for fever.  Respiratory: Negative for shortness of breath.   Cardiovascular: Negative for chest pain.  Gastrointestinal: Negative for abdominal pain and vomiting.  Skin: Positive for wound.  Psychiatric/Behavioral: Positive for sleep disturbance. Negative for suicidal ideas.    Physical Exam Updated Vital Signs BP (!) 184/103   Pulse 75   Temp 98.6 F (37 C) (Oral)   Resp 19   Ht 5\' 6"  (1.676 m)   Wt (!) 94.8 kg   LMP 08/25/2019   SpO2 99%   BMI 33.73 kg/m   Physical Exam Vitals and nursing note reviewed.  Constitutional:      General: She is not in acute distress.    Appearance: She is well-developed. She is not diaphoretic.  HENT:     Head: Normocephalic and atraumatic.  Cardiovascular:     Rate and Rhythm: Normal rate and regular rhythm.     Pulses: Normal pulses.     Heart  sounds: Normal heart sounds.  Pulmonary:     Effort: Pulmonary effort is normal.     Breath sounds: Normal breath sounds.  Abdominal:     Palpations: Abdomen is soft.     Tenderness: There is no abdominal tenderness.  Musculoskeletal:        General: No swelling.  Skin:    General: Skin is warm and dry.       Neurological:     Sensory: No sensory deficit.     Motor: No weakness.  Psychiatric:        Mood and Affect: Affect is flat.        Speech: Speech is delayed.        Behavior: Behavior is slowed and withdrawn. Behavior is not agitated or aggressive. Behavior is cooperative.  Thought Content: Thought content does not include homicidal or suicidal ideation.       ED Results / Procedures / Treatments   Labs (all labs ordered are listed, but only abnormal results are displayed) Labs Reviewed  CBC WITH DIFFERENTIAL/PLATELET - Abnormal; Notable for the following components:      Result Value   RDW 16.2 (*)    All other components within normal limits  COMPREHENSIVE METABOLIC PANEL - Abnormal; Notable for the following components:   Calcium 8.8 (*)    Albumin 2.8 (*)    All other components within normal limits  URINALYSIS, ROUTINE W REFLEX MICROSCOPIC - Abnormal; Notable for the following components:   APPearance HAZY (*)    Hgb urine dipstick LARGE (*)    Protein, ur 30 (*)    Leukocytes,Ua SMALL (*)    Bacteria, UA RARE (*)    All other components within normal limits  CK  RAPID URINE DRUG SCREEN, HOSP PERFORMED  CBG MONITORING, ED    EKG None  Radiology No results found.  Procedures Procedures (including critical care time)  Medications Ordered in ED Medications  diphenhydrAMINE (BENADRYL) injection 25 mg (25 mg Intravenous Given 10/08/19 1946)    ED Course  I have reviewed the triage vital signs and the nursing notes.  Pertinent labs & imaging results that were available during my care of the patient were reviewed by me and considered in my  medical decision making (see chart for details).  Clinical Course as of Oct 07 2317  Wed Oct 08, 2019  2034 [phone] Kimberly Orozco   [LM]  2620 44 year old female brought in by mom for concerns for ongoing medication causing body stiffness and difficulty with ADLs.  Mom states patient was better managed on injectable medication when living in [place].  Mom also request check of right thigh wound from prior surgical debridement for necrotizing fasciitis. Wound has been photographed and addressed.  Patient was evaluated by behavioral health and psychiatrically cleared.  Patient does not wish to be admitted to the hospital does not have any medical criteria to admit her at this point.  She has been referred to outpatient behavioral health services, recommend contacting tomorrow to schedule an appointment.  Also referred to [facility] health and wellness for primary care.  Patient's blood pressure is elevated today, elevated on prior visits as well, recommend monitoring and treatment with PCP.   [LM]    Clinical Course User Index [LM] Alden Hipp   MDM Rules/Calculators/A&P                          Final Clinical Impression(s) / ED Diagnoses Final diagnoses:  Adverse effect of drug, subsequent encounter  Encounter for post surgical wound check    Rx / DC Orders ED Discharge Orders    None       Alden Hipp 10/08/19 2319    Tilden Fossa, MD 10/08/19 [id number]

## 2019-10-08 NOTE — BH Assessment (Signed)
Comprehensive Clinical Assessment (CCA) Screening, Triage and Referral Note  10/08/2019 Kimberly Orozco [id number]  Visit Diagnosis: F20.9, Schizophrenia  Kimberly Orozco is a 44 year old patient who was voluntarily brought to [facility] by her mother due to pt experiencing stiff arms and legs as a result of taking too much of her Haldol for the past 3 days. Reportedly, pt was prescribed 33m in the morning and 264mat night, but pt took all of the medication (3019min the morning for 3 days straight, resulting in physical symptoms. Pt states, "I'm feeling stiff and numb. I'm shaking and trembling. I was taking too much [of my medicine]; instead of taking one [pill], I was taking two. That was going on for a couple of days."  Pt Pt denies SI. She denies she has any hx of wanting to harm or kill herself in the past. She shares she has been hospitalized for mental health concerns once in the past several months (February 2021) and [place] a hx of engaging in NSSIB. She denies she's ever made plans, or has a plan, to kill herself. Pt denies current HI, access to guns/weapons, any engagement with the legal system, and no SA. She shares she has a hx of AVH, though she states she hasn't experienced these symptoms for over two years. Pt acknowledges a hx of HI towards her mother, stating that is why she was hospitalized in February 2021.  Pt states she lives with her mother, father, and two cousins, aged 13 66d 16.19t shares she is currently on disability for mental and physical health. She shares she believes her needs are being met at home. Pt denies she currently has a therapist or psychiatrist; she [place] she did not believe it was helping her. Pt states she is unsure who prescribes her medication for her at this time.  Pt states recently she has been waking up several times during the  night, which is not typical for her. She states she usually falls back asleep, but it sometimes takes awhile. She states she gets around 6 hours of sleep. Pt shares her appetite has been suppressed for the past several weeks. She states her weight went from 260 down to 209. "It started at [place]--I didn't like their food, so I wasn't eating. And then it continued after then. My appetite has changed." She states she feels hungry, just not as frequently as she was previously.   Pt's protective factors include no SI, no HI, and no AVH. She has consistent housing and the support of her parents.  Pt declined to provide verbal consent to clinician to contact a friend/family member for collateral information.  Pt is oriented x5. Her recent and remote memory is intact. Pt was cooperative, though quiet and flat, throughout the assessment process. Pt's insight, judgement, and impulse control is fair to good at this time.   Patient Reported Information How did you hear about us?[place]/Friend   Referral name: BreQamar Orozco   Referral phone number: [phone] you see for routine medical problems? Hospital ER   Practice/Facility Name: No data recorded  Practice/Facility Phone Number: No data recorded  Name of Contact: No data recorded  Contact Number: No data recorded  Contact Fax Number: No data recorded  Prescriber Name: No data recorded  Prescriber Address (if known): No data recorded What Is the Reason  for Your Visit/Call Today? Pt was taking her Haldol prescription incorrectly, thus resulting in stiff limbs and feeling as if she could not move her extremities, desipite relatives seeing her move them.  How Long Has This Been Causing You Problems? <Week  Have You Recently Been in Any Inpatient Treatment (Hospital/Detox/Crisis Center/28-Day Program)? Yes (Admitted to [facility] from 04/24/2019 - 05/02/2019)   Name/Location of Program/Hospital:[facility]   How Long Were You There?  04/24/2019 - 05/02/2019   When Were You Discharged? 05/02/19  Have You Ever Received Services From [facility] Before? Yes   Who Do You See at [facility]? Ralene Ok, MD for wound cleaning and Mingo Amber, MD for upcoming wound surgery  Have You Recently Had Any Thoughts About Hurting Yourself? No   Are You Planning to Commit Suicide/Harm Yourself At This time?  No  Have you Recently Had Thoughts About Tiro? Yes (Pt was admitted in 04/2019 for thoughts of harming her mother.)   Explanation: No data recorded Have You Used Any Alcohol or Drugs in the Past 24 Hours? No   How Long Ago Did You Use Drugs or Alcohol?  No data recorded  What Did You Use and How Much? No data recorded What Do You Feel Would Help You the Most Today? Other (Comment) ("I don't know.")  Do You Currently Have a Therapist/Psychiatrist? No   Name of Therapist/Psychiatrist: No data recorded  Have You Been Recently Discharged From Any Office Practice or Programs? No   Explanation of Discharge From Practice/Program:  No data recorded    CCA Screening Triage Referral Assessment Type of Contact: Tele-Assessment   Is this Initial or Reassessment? Initial Assessment   Date Telepsych consult ordered in [facility]:  10/08/19   Time Telepsych consult ordered in [facility]:  1926  Patient Reported Information Reviewed? Yes   Patient Left Without Being Seen? No data recorded  Reason for Not Completing Assessment: No data recorded Collateral Involvement: Pt declined to provide clinician verbal consent to contact friends/family.  Does Patient Have a Stage manager Guardian? No data recorded  Name and Contact of Legal Guardian:  No data recorded If Minor and Not Living with Parent(s), Who has Custody? N/A  Is CPS involved or ever been involved? Never  Is APS involved or ever been involved? Currently  Patient Determined To Be At Risk for Harm To Self or Others Based on Review of Patient Reported  Information or Presenting Complaint? No   Method: No data recorded  Availability of Means: No data recorded  Intent: No data recorded  Notification Required: No data recorded  Additional Information for Danger to Others Potential:  No data recorded  Additional Comments for Danger to Others Potential:  No data recorded  Are There Guns or Other Weapons in Your Home?  No    Types of Guns/Weapons: No data recorded   Are These Weapons Safely Secured?                              No data recorded   Who Could Verify You Are Able To Have These Secured:    No data recorded Do You Have any Outstanding Charges, Pending Court Dates, Parole/Probation? No data recorded Contacted To Inform of Risk of Harm To Self or Others: No data recorded Location of Assessment: [facility] ED  Does Patient Present under Involuntary Commitment? No   IVC Papers Initial File Date: No data recorded  [place] of Residence: [place]  Patient Currently Receiving the Following Services: Not Receiving Services   Determination of Need: Routine (7 days)   Options For Referral: Medication Management  Adaku Anike, NP, reviewed pt's chart and information and determined pt can be psych cleared. Pt will be provided otpt service information in an attempt for pt to identify and utilize otpt psychiatry services for medication management. This information was provided to Suella Broad, PA-C and to pt's nurse, Lucina Mellow, at 2132 via internal messenger.  Dannielle Burn, LMFT

## 2019-10-08 NOTE — Discharge Instructions (Addendum)
Follow-up with outpatient behavioral health, call tomorrow to schedule an appointment. Return to ER for new or worsening symptoms. Follow-up with a primary care provider, information given today on [facility] and wellness, call to schedule appointment.

## 2019-10-08 NOTE — ED Triage Notes (Signed)
Patient brought in by her mother.   Patient reports patient was taking too much of her medication (Haloperidol 20mg  tab and a 10mg  tab) only suppose to take 10mg  in morning and 20mg  at night but was taking both for 3 days straight. Since then patient has been unable to move her arms and walk like normal causing her to fall.    Patient in wheelchair in triage.

## 2019-10-09 ENCOUNTER — Telehealth: Payer: Self-pay | Admitting: Plastic Surgery

## 2019-10-09 ENCOUNTER — Ambulatory Visit (INDEPENDENT_AMBULATORY_CARE_PROVIDER_SITE_OTHER): Payer: Medicare Other | Admitting: Student

## 2019-10-09 DIAGNOSIS — L304 Erythema intertrigo: Secondary | ICD-10-CM | POA: Diagnosis not present

## 2019-10-09 DIAGNOSIS — S31109D Unspecified open wound of abdominal wall, unspecified quadrant without penetration into peritoneal cavity, subsequent encounter: Secondary | ICD-10-CM

## 2019-10-09 DIAGNOSIS — G2402 Drug induced acute dystonia: Secondary | ICD-10-CM | POA: Diagnosis present

## 2019-10-09 MED ORDER — NYSTATIN 100000 UNIT/GM EX POWD
1.0000 | Freq: Three times a day (TID) | CUTANEOUS | 0 refills | Status: DC
Start: 2019-10-09 — End: 2019-12-11

## 2019-10-09 NOTE — Telephone Encounter (Signed)
Aletha with [facility] would like a call to help her understand the possible care the patient will need after her surgery on 8/17 so she can help coordinate the home care.   [phone]

## 2019-10-09 NOTE — Patient Instructions (Addendum)
Thank you, Ms.[name] for allowing [place] to provide your care today. Today we discussed your mood, stiffness, shakiness and your wound.   I have ordered the following medication/changed the following medications:  1. Nystatin 15 mg powder applied 3 times a day  You have a prescription for Cogentin, please pick this up at your pharmacy and take as prescribed  Please follow-up with Vesta Mixer  Should you have any questions or concerns please call the internal medicine clinic at [phone].    Sharrell Ku, MD, MPH [facility]   My Chart Access: [email or web address]   If you have not already done so, please get your COVID 19 vaccine  To schedule an appointment for a COVID vaccine choice any of the following: Go to [email or web address]   Go to [email or web address]                  Call [phone]                                     Call [phone] and select Option 2

## 2019-10-09 NOTE — Progress Notes (Signed)
   CC: Stiffness  HPI:  [name] is a 44 y.o. woman with PMH of schizophrenia, bipolar disorder, and diabetes who presents to the clinic after drug reaction due to inappropriate administration of medication.  Mother states patient has been taking Haldol 10 mg in the morning and 20 mg at night.  After her last clinic appointment, the picked up her prescriptions and patient was confused so she started taking both her Haldol medications in the morning.  Mother states they did not get Cogentin from the pharmacy.  Patient subsequently started developing dystonic symptoms such as stiffness, shakiness, and slowed reactions.  Mother states they have been seen in the ED 3 times and not eligible for patient.  States patient has been having visual hallucinations but denies any delusions. Please see problem based charting for evaluation, assessment and plan.  Past Medical History:  Diagnosis Date  . Bipolar disorder (HCC)   . Diabetes mellitus without complication (HCC)   . Schizophrenia (HCC)    Review of Systems:  All ROS negative otherwise as stated in the HPI  Physical Exam:  General: Middle-aged woman. No acute distress. Well nourished, well developed. Head: Normocephalic, atraumatic w/o tenderness Cardiac: RRR. No murmurs, rubs or gallops. S1, S2. No lower extremities edema Respiratory: Lungs CTAB. No wheezing or crackles. No increased WOB Abdominal: Soft, symmetric and non tender. No organomegaly. Normal bowel sounds Skin: Open abdominal wall wound approximately 28 cm x 6 cm.  Moderate erythema with mild oozing. Healing well with granulation tissue. Extremities: Atraumatic. Full ROM. Pulse palpable. Neuro: A&O x 3.  Involuntary movements of her upper extremities.  5/5 strength in all extremities.  Normal sensation in all extremities. Psych: Flat affect. Slowed speech.  Vitals:   10/09/19 1530  BP: (!) 152/95  Pulse: 101  Temp: 99.1 F (37.3 C)  TempSrc: Oral  SpO2: 100%      Assessment & Plan:   See Encounters Tab for problem based charting.  Patient seen with Dr. Curt Bears, MD, MPH

## 2019-10-09 NOTE — Telephone Encounter (Signed)
Called and spoke with RN from [facility] home health and discussed with her that postoperatively patient would have a bolster in place which would be left in place for 1 week total.  Patient would follow-up in our office and we would remove the bolster at 1 week postop.  Patient would benefit from home health services during that week to ensure dressing has not been removed as she will need some coverage for the bolster with ABD pads and Medipore tape.  I discussed with her that we can send updated orders closer to the date.  I also discussed with her that until then patient should continue to have 3 times per week dressing changes with Xeroform.  Recommend calling with any further questions or concerns.

## 2019-10-10 ENCOUNTER — Encounter: Payer: Self-pay | Admitting: Student

## 2019-10-10 ENCOUNTER — Other Ambulatory Visit: Payer: Self-pay | Admitting: *Deleted

## 2019-10-10 ENCOUNTER — Other Ambulatory Visit: Payer: Self-pay | Admitting: Student

## 2019-10-10 DIAGNOSIS — F200 Paranoid schizophrenia: Secondary | ICD-10-CM

## 2019-10-10 DIAGNOSIS — L304 Erythema intertrigo: Secondary | ICD-10-CM | POA: Insufficient documentation

## 2019-10-10 LAB — GLUCOSE, CAPILLARY: Glucose-Capillary: 97 mg/dL (ref 70–99)

## 2019-10-10 MED ORDER — BENZTROPINE MESYLATE 1 MG PO TABS: 1.0000 mg | ORAL_TABLET | Freq: Two times a day (BID) | ORAL | 0 refills | Status: DC

## 2019-10-10 NOTE — Telephone Encounter (Signed)
Patient's mother called in stating cogentin was not at pharmacy. Last written on 09/25/2019, however, Print option was selected. Please resend as Normal. Kinnie Feil, BSN, RN-BC

## 2019-10-10 NOTE — Assessment & Plan Note (Signed)
Patient was diagnosed with a necrotizing fasciitisaffecting her abdomen and right thigh inApril s/p multiple debridements. Course has been complicated by right abdominal wound dehiscence x2. Dr Arita Miss (plastic surgery) follows patient and has plans for skin graft to assist in wound healing. Mother asked clinic staff to help change her dressing in clinic. Wound is approximately 28 cm by 6 cm with moderate erythema but healing with some granulation tissue (see media tab). Of note patient has home health 3 times/ week for wound care/dressing changes.    Plan: --Wound dressed in clinic --Continue home health 3 times/week for wound care --Planned split-thickness skin graft to right thigh wound on August 2 with Dr. Arita Miss

## 2019-10-10 NOTE — Assessment & Plan Note (Signed)
Patient started having involuntary movements of her upper extremities, stiffness and shakiness after she took both her Haldo doses in the morning for 2 straight days. Patient has not been taking her cogentin and states she did not receive it from the pharmacy. Per mother, patient has also had visual hallucination. Patient has been seen in the ED 3 times for these acute reactions and work up did not show any organic causes. Patient's Haldo was started while at [facility] so mother was advised to take patient to them to manage her medications.   Plan: --Patient advised to pick up Cogentin from the pharmacy --Advised mother to take patient to [facility] as soon as she can

## 2019-10-10 NOTE — Assessment & Plan Note (Signed)
Patient presented with an inflammatory rash under left breast likely secondary to poor care at home. Advised mother to keep the area dry and apply the prescribed power to the area.   Plan: --Apply Nystatin power over affected area three times a day.

## 2019-10-10 NOTE — Progress Notes (Signed)
Internal Medicine Clinic Attending  I saw and evaluated the patient.  I personally confirmed the key portions of the history and exam documented by Dr. Kirke Corin and I reviewed pertinent patient test results.  The assessment, diagnosis, and plan were formulated together and I agree with the documentation in the resident's note.  Kimberly Orozco has both medical and psychiatric multimorbidity and is currently being cared for by her mother, who is overwhelmed while also caring for grandchildren.  Kimberly Orozco has not recently been evaluated by her psychiatrist, and medication changes have been made associated with her recent hospitalization.  Her mother had not considered having her return to [facility] for evaluation, and is agreeable to making these arrangements by calling the morning following appt.  We have advised that until this occurs, she should continue with the previously prescribed medications (start the intended Cogentin, take the haldol as prescribed).  Note that her mother anticipates that Kimberly Orozco will have to move eventually to a care facility as she will not be able to meet her needs.  Kimberly Orozco was living in [place] and has moved here in the not too distant past.

## 2019-10-13 ENCOUNTER — Inpatient Hospital Stay: Payer: Self-pay | Admitting: Family Medicine

## 2019-10-15 ENCOUNTER — Telehealth: Payer: Self-pay

## 2019-10-15 ENCOUNTER — Telehealth: Payer: Self-pay | Admitting: Internal Medicine

## 2019-10-15 ENCOUNTER — Telehealth: Payer: Self-pay | Admitting: Plastic Surgery

## 2019-10-15 DIAGNOSIS — F200 Paranoid schizophrenia: Secondary | ICD-10-CM

## 2019-10-15 MED ORDER — HALOPERIDOL 20 MG PO TABS: 20.0000 mg | ORAL_TABLET | Freq: Every day | ORAL | 0 refills | Status: DC

## 2019-10-15 MED ORDER — HALOPERIDOL 10 MG PO TABS: 10.0000 mg | ORAL_TABLET | Freq: Every morning | ORAL | 0 refills | Status: DC

## 2019-10-15 NOTE — Telephone Encounter (Signed)
Patient's mother walked into clinic requesting haldol refill, patient is having uncontrolled schizophrenia. Attempted to go to [facility] yesterday but they were closed. I called [facility] and verified that they are open this week, but close for lunch daily between 12-1pm. Per office visit note on 7/16, patient was previously referred to psychiatry. I have provided a 1 month refill of her haldol (haldol 10 mg qAM and 20 mg QHS) and instructed her to follow up with [facility] in the meantime while our office follows up on this referral.

## 2019-10-15 NOTE — Telephone Encounter (Signed)
Return call to [facility]-no answer/left v/m requesting call for orders

## 2019-10-15 NOTE — Telephone Encounter (Signed)
Pt's mother, Steward Drone, here at front desk. Stated pt needs to seen by psych b/c she was told yesterday when she went by there that [facility] was completely closing down. And she does not want pt's schizophrenia to start "acting up"; at the moment, she's calm. Also wanted to know if she should continue taking Haldol. I talked to Dr Antony Contras, Attending.

## 2019-10-15 NOTE — Telephone Encounter (Signed)
Pt's mother stated she will go back to [facility] after being informed it is not closing. Also informed of Haldol refills.

## 2019-10-15 NOTE — Telephone Encounter (Signed)
Please call Sienna at [facility] regarding orders for this patient. She was unable to read the orders that were sent via fax. She has a secure vm. [phone]

## 2019-10-15 NOTE — Telephone Encounter (Signed)
I called the number provided, no answer, did not leave voicemail.  If they call back, the orders have not changed and patient should be seen 3 times per week by home health with application of Xeroform, 4 x 4 gauze, ABD, Medipore tape to right thigh wound.  If they call back, I am happy to discuss.  I will try to call again later this afternoon

## 2019-10-16 NOTE — Telephone Encounter (Signed)
Called and spoke with the nurse at [facility] regarding the message below.  She stated that they have the wound care orders for the patient, but they were unable to read the providers name who signed the orders.    Informed her the provider was Keenan Bachelor, PA-C.  She verbalized understanding and agreed.  The nurse also asked about the patient's medications.  I informed her that our providers do not handle the patient's medicines unless they prescribe them.    She asked if we known the patient's PCP name.  Gave her the patient's PCP name and number.//AB/CMA

## 2019-10-17 ENCOUNTER — Encounter (HOSPITAL_COMMUNITY): Payer: Self-pay | Admitting: Emergency Medicine

## 2019-10-17 ENCOUNTER — Emergency Department (HOSPITAL_COMMUNITY)
Admission: EM | Admit: 2019-10-17 | Discharge: 2019-10-17 | Disposition: A | Discharge status: Home / Self Care | Payer: Medicare Other | Attending: Emergency Medicine | Admitting: Emergency Medicine

## 2019-10-17 DIAGNOSIS — F17210 Nicotine dependence, cigarettes, uncomplicated: Secondary | ICD-10-CM | POA: Insufficient documentation

## 2019-10-17 DIAGNOSIS — E1110 Type 2 diabetes mellitus with ketoacidosis without coma: Secondary | ICD-10-CM | POA: Diagnosis not present

## 2019-10-17 DIAGNOSIS — R030 Elevated blood-pressure reading, without diagnosis of hypertension: Secondary | ICD-10-CM | POA: Insufficient documentation

## 2019-10-17 NOTE — Discharge Instructions (Signed)
Your blood pressure does not appear to be causing any damage to organs. It is important that you follow-up with your primary care doctor regarding your blood pressure, as you may need to start blood pressure medication. Make sure you are eating a low-salt diet and exercising regularly to help manage your blood pressure. Return to the emergency room if you develop headache, vision changes, slurred speech, numbness, chest pain, or if your blood pressure is consistently over 200. Return to the ER with any new, worsening, or concerning symptoms.

## 2019-10-17 NOTE — ED Triage Notes (Signed)
Pt arrives via ptar from home, pt has home health nurse that has been coming out to her house since a recent surgery, ems reports BPs of 160/100 and 143/98. Pt a/ox4, resp e/u, nad.

## 2019-10-17 NOTE — ED Provider Notes (Signed)
[facility] EMERGENCY DEPARTMENT Provider Note   CSN: [id number] Arrival date & time: 10/17/19  1236     History Chief Complaint  Patient presents with  . Hypertension    Kimberly Orozco is a 44 y.o. female presenting for evaluation of hypertension.  Patient states she had a home health nurse helping change the wound and her blood pressure today was mildly elevated, 1 40-1 50 systolic.  Patient states she normally runs 1 30-1 40.  She is not on blood pressure medicine.  She denies headache, vision changes, slurred speech, numbness, tingling, weakness, chest pain, or change in urination.  She denies recent NSAID use.  She denies recent antihistamine use, though has been treated with Benadryl for dystonic reaction several times in the past several weeks.  She states she was previously told that she had diabetes, though recently told that she does not, is no longer on medication for this.  Per chart review, elevated blood sugar was thought to be due to her psychiatric medications which were changed.  Her last A1c was normal.  Her blood pressure at her last PCP visit was 153/95.   HPI     Past Medical History:  Diagnosis Date  . AKI (acute kidney injury) (HCC)   . Bipolar disorder (HCC)   . Diabetes mellitus without complication (HCC)   . Schizophrenia [facility])     Patient Active Problem List   Diagnosis Date Noted  . Intertrigo 10/10/2019  . 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 C. difficile diarrhea 09/08/2019  . Open abdominal wall wound 08/04/2019  . Open thigh wound, right, subsequent encounter 08/04/2019  . Septic shock (HCC)   . Acute respiratory failure (HCC)   . Necrotizing cellulitis   . DKA (diabetic ketoacidoses) (HCC) 06/23/2019  . Diabetes mellitus type 2 in obese (HCC) 04/27/2019  . Bipolar disorder (HCC) 04/26/2019  . Schizophrenia (HCC) 04/25/2019    Past Surgical History:  Procedure Laterality Date  . APPLICATION OF A-CELL OF EXTREMITY N/A  07/08/2019   Procedure: with placement of primatrix AG;  Surgeon: Allena Napoleon, MD;  Location: [facility] OR;  Service: Plastics;  Laterality: N/A;  . APPLICATION OF A-CELL OF EXTREMITY Right 07/22/2019   Procedure: placement of primatrix mesh;  Surgeon: Allena Napoleon, MD;  Location: [facility] OR;  Service: Plastics;  Laterality: Right;  . INCISION AND DRAINAGE OF WOUND N/A 06/26/2019   Procedure: IRRIGATION AND DEBRIDEMENT ABDOMINAL WALL AND GROIN;  Surgeon: Axel Filler, MD;  Location: [facility] OR;  Service: General;  Laterality: N/A;  . INCISION AND DRAINAGE OF WOUND N/A 07/08/2019   Procedure: Debridement of abdominal and groin wound;  Surgeon: Allena Napoleon, MD;  Location: [facility] OR;  Service: Plastics;  Laterality: N/A;  . INCISION AND DRAINAGE PERIRECTAL ABSCESS Right 06/24/2019   Procedure: IRRIGATION AND DEBRIDEMENT RIGHT GROIN AND RIGHT BUTTOCK;  Surgeon: Axel Filler, MD;  Location: [facility] OR;  Service: General;  Laterality: Right;  . IRRIGATION AND DEBRIDEMENT OF WOUND WITH SPLIT THICKNESS SKIN GRAFT Right 07/22/2019   Procedure: Debridement of right thigh wound;  Surgeon: Allena Napoleon, MD;  Location: [facility] OR;  Service: Plastics;  Laterality: Right;     OB History   No obstetric history on file.     Family History  Family history unknown: Yes    Social History   Tobacco Use  . Smoking status: Current Every Day Smoker    Types: Cigarettes  . Smokeless tobacco: Never Used  . Tobacco comment: 4-5  per day   Vaping Use  . Vaping Use: Never used  Substance Use Topics  . Alcohol use: Not Currently  . Drug use: Never    Home Medications Prior to Admission medications   Medication Sig Start Date End Date Taking? Authorizing Provider  benztropine (COGENTIN) 1 MG tablet TAKE 1 TABLET(1 MG) BY MOUTH TWICE DAILY 10/14/19   Claudean Severance, MD  acetaminophen (TYLENOL) 325 MG tablet Take 650 mg by mouth every 6 (six) hours as needed for mild pain or headache.    [provider]    haloperidol (HALDOL) 10 MG tablet Take 1 tablet (10 mg total) by mouth every morning. 10/15/19   Reymundo Poll, MD  haloperidol (HALDOL) 20 MG tablet Take 1 tablet (20 mg total) by mouth at bedtime. 10/15/19   Reymundo Poll, MD  nystatin (NYSTATIN) powder Apply 1 application topically 3 (three) times daily. Dry under the breast and apply 3 times daily. 10/09/19   [name], MD    Allergies    Patient has no known allergies.  Review of Systems   Review of Systems  All other systems reviewed and are negative.   Physical Exam Updated Vital Signs BP (!) 141/100   Pulse 84   Temp 98.5 F (36.9 C)   Resp 16   Ht 5\' 6"  (1.676 m)   Wt 93 kg   SpO2 100%   BMI 33.09 kg/m   Physical Exam Vitals and nursing note reviewed.  Constitutional:      General: She is not in acute distress.    Appearance: She is well-developed.     Comments: Resting in the bed in no acute distress  HENT:     Head: Normocephalic and atraumatic.  Eyes:     Conjunctiva/sclera: Conjunctivae normal.     Pupils: Pupils are equal, round, and reactive to light.  Cardiovascular:     Rate and Rhythm: Normal rate and regular rhythm.     Pulses: Normal pulses.  Pulmonary:     Effort: Pulmonary effort is normal. No respiratory distress.     Breath sounds: Normal breath sounds. No wheezing.  Abdominal:     General: There is no distension.     Palpations: Abdomen is soft. There is no mass.     Tenderness: There is no abdominal tenderness. There is no guarding or rebound.  Musculoskeletal:        General: Normal range of motion.     Cervical back: Normal range of motion and neck supple.  Skin:    General: Skin is warm and dry.     Capillary Refill: Capillary refill takes less than 2 seconds.  Neurological:     Mental Status: She is alert and oriented to person, place, and time.     GCS: GCS eye subscore is 4. GCS verbal subscore is 5. GCS motor subscore is 6.     Cranial Nerves: Cranial nerves are  intact.     Sensory: Sensation is intact.     Motor: Motor function is intact.     Gait: Gait is intact.     Comments: No neuro deficits.  CN intact.  Strength and sensation intact x4     ED Results / Procedures / Treatments   Labs (all labs ordered are listed, but only abnormal results are displayed) Labs Reviewed - No data to display  EKG None  Radiology No results found.  Procedures Procedures (including critical care time)  Medications Ordered in ED Medications -  No data to display  ED Course  I have reviewed the triage vital signs and the nursing notes.  Pertinent labs & imaging results that were available during my care of the patient were reviewed by me and considered in my medical decision making (see chart for details).    MDM Rules/Calculators/A&P                          Patient presenting for evaluation of elevated blood pressure.  On exam, patient is nontoxic.  No sign of endorgan damage.  Patient blood pressure is mildly elevated, 1 40-1 60 systolic, 90-100 diastolic.  She did had an elevated blood pressure reading at her last PCP visit.  As such, she would likely qualify for blood pressure medication/management.  Will have her follow-up with PCP for this, as it is asymptomatic at this time. Dicussed diet and exercise changes to help manage BP.  Discussed prompt return to the ER with any sign of endorgan damage.  At this time, patient appears safe for discharge.  Return precautions given.  Patient states she understands and agrees to plan.  Final Clinical Impression(s) / ED Diagnoses Final diagnoses:  Elevated blood pressure reading in office without diagnosis of hypertension    Rx / DC Orders ED Discharge Orders    None       Alveria Apley, PA-C 10/17/19 2120    Raeford Razor, MD 10/17/19 2303

## 2019-10-17 NOTE — ED Notes (Signed)
Patient verbalizes understanding of discharge instructions. Opportunity for questioning and answers were provided. Armband removed by staff, pt discharged from ED to home with father °

## 2019-10-21 ENCOUNTER — Ambulatory Visit (HOSPITAL_COMMUNITY)
Admission: EM | Admit: 2019-10-21 | Discharge: 2019-10-21 | Discharge status: Absent without leave | Payer: Medicare Other

## 2019-10-21 ENCOUNTER — Other Ambulatory Visit: Payer: Self-pay

## 2019-10-24 ENCOUNTER — Other Ambulatory Visit (HOSPITAL_COMMUNITY): Payer: Medicare Other

## 2019-10-25 ENCOUNTER — Other Ambulatory Visit (HOSPITAL_COMMUNITY)
Admission: RE | Admit: 2019-10-25 | Discharge: 2019-10-25 | Disposition: A | Discharge status: Home / Self Care | Payer: Medicare Other | Source: Ambulatory Visit | Attending: Plastic Surgery | Admitting: Plastic Surgery

## 2019-10-25 DIAGNOSIS — Z01812 Encounter for preprocedural laboratory examination: Secondary | ICD-10-CM | POA: Insufficient documentation

## 2019-10-25 DIAGNOSIS — Z20822 Contact with and (suspected) exposure to covid-19: Secondary | ICD-10-CM | POA: Insufficient documentation

## 2019-10-25 LAB — SARS CORONAVIRUS 2 (TAT 6-24 HRS): SARS Coronavirus 2: NEGATIVE

## 2019-10-27 ENCOUNTER — Other Ambulatory Visit: Payer: Self-pay

## 2019-10-27 ENCOUNTER — Encounter (HOSPITAL_COMMUNITY): Payer: Self-pay | Admitting: Plastic Surgery

## 2019-10-27 NOTE — Progress Notes (Signed)
Spoke with pt for pre-op call. Pt denies cardiac history, states she has been told she was hypertensive, but is not on any medications. She states she has been told she was diabetic and has also been told she isn't diabetic. Not on any medications. Last A1C was 4.9 on 09/08/19. Per note from ED physician on 10/17/19, the elevated blood sugar was thought to be due to her psych medications.   Covid test was done 10/25/19 and it's negative. Pt states she has been in quarantine since the test was done and understands to stay in quarantine until she comes to the hospital tomorrow.

## 2019-10-28 ENCOUNTER — Ambulatory Visit (HOSPITAL_COMMUNITY): Payer: Medicare Other | Admitting: Vascular Surgery

## 2019-10-28 ENCOUNTER — Ambulatory Visit (HOSPITAL_COMMUNITY)
Admission: RE | Admit: 2019-10-28 | Discharge: 2019-10-28 | Disposition: A | Discharge status: Home / Self Care | Payer: Medicare Other | Attending: Plastic Surgery | Admitting: Plastic Surgery

## 2019-10-28 ENCOUNTER — Telehealth: Payer: Self-pay | Admitting: Plastic Surgery

## 2019-10-28 ENCOUNTER — Other Ambulatory Visit: Payer: Self-pay

## 2019-10-28 ENCOUNTER — Encounter (HOSPITAL_COMMUNITY): Payer: Self-pay | Admitting: Plastic Surgery

## 2019-10-28 ENCOUNTER — Encounter (HOSPITAL_COMMUNITY)
Admission: RE | Disposition: A | Discharge status: Home / Self Care | Payer: Self-pay | Source: Home / Self Care | Attending: Plastic Surgery

## 2019-10-28 DIAGNOSIS — S71101A Unspecified open wound, right thigh, initial encounter: Secondary | ICD-10-CM

## 2019-10-28 DIAGNOSIS — F17200 Nicotine dependence, unspecified, uncomplicated: Secondary | ICD-10-CM | POA: Diagnosis not present

## 2019-10-28 DIAGNOSIS — L0390 Cellulitis, unspecified: Secondary | ICD-10-CM

## 2019-10-28 DIAGNOSIS — I10 Essential (primary) hypertension: Secondary | ICD-10-CM | POA: Diagnosis present

## 2019-10-28 DIAGNOSIS — X58XXXA Exposure to other specified factors, initial encounter: Secondary | ICD-10-CM | POA: Diagnosis not present

## 2019-10-28 DIAGNOSIS — E119 Type 2 diabetes mellitus without complications: Secondary | ICD-10-CM | POA: Diagnosis not present

## 2019-10-28 DIAGNOSIS — F17210 Nicotine dependence, cigarettes, uncomplicated: Secondary | ICD-10-CM | POA: Diagnosis not present

## 2019-10-28 HISTORY — DX: Essential (primary) hypertension: I10

## 2019-10-28 HISTORY — PX: IRRIGATION AND DEBRIDEMENT OF WOUND WITH SPLIT THICKNESS SKIN GRAFT: SHX5879

## 2019-10-28 LAB — BASIC METABOLIC PANEL
Anion gap: 9 (ref 5–15)
BUN: 5 mg/dL — ABNORMAL LOW (ref 6–20)
CO2: 24 mmol/L (ref 22–32)
Calcium: 8.4 mg/dL — ABNORMAL LOW (ref 8.9–10.3)
Chloride: 103 mmol/L (ref 98–111)
Creatinine, Ser: 0.66 mg/dL (ref 0.44–1.00)
Glucose, Bld: 113 mg/dL — ABNORMAL HIGH (ref 70–99)
Potassium: 3.1 mmol/L — ABNORMAL LOW (ref 3.5–5.1)
Sodium: 136 mmol/L (ref 135–145)

## 2019-10-28 LAB — HCG, SERUM, QUALITATIVE: Preg, Serum: NEGATIVE

## 2019-10-28 LAB — GLUCOSE, CAPILLARY
Glucose-Capillary: 119 mg/dL — ABNORMAL HIGH (ref 70–99)
Glucose-Capillary: 88 mg/dL (ref 70–99)

## 2019-10-28 SURGERY — IRRIGATION AND DEBRIDEMENT OF WOUND WITH SPLIT THICKNESS SKIN GRAFT
Anesthesia: General | Site: Thigh | Laterality: Right

## 2019-10-28 MED ORDER — MINERAL OIL LIGHT 100 % EX OIL
TOPICAL_OIL | CUTANEOUS | Status: DC | PRN
  Administered 2019-10-28: 1 via TOPICAL

## 2019-10-28 MED ORDER — FENTANYL CITRATE (PF) 250 MCG/5ML IJ SOLN
INTRAMUSCULAR | Status: AC
  Filled 2019-10-28: qty 5

## 2019-10-28 MED ORDER — MEPERIDINE HCL 25 MG/ML IJ SOLN: 6.2500 mg | INTRAMUSCULAR | Status: DC | PRN

## 2019-10-28 MED ORDER — FENTANYL CITRATE (PF) 100 MCG/2ML IJ SOLN
INTRAMUSCULAR | Status: AC
  Filled 2019-10-28: qty 2

## 2019-10-28 MED ORDER — SODIUM CHLORIDE 0.9 % IR SOLN
Status: DC | PRN
  Administered 2019-10-28: 1000 mL
  Administered 2019-10-28: 3000 mL

## 2019-10-28 MED ORDER — MIDAZOLAM HCL 2 MG/2ML IJ SOLN
INTRAMUSCULAR | Status: AC
  Filled 2019-10-28: qty 2

## 2019-10-28 MED ORDER — ONDANSETRON HCL 4 MG/2ML IJ SOLN
INTRAMUSCULAR | Status: DC | PRN
  Administered 2019-10-28: 4 mg via INTRAVENOUS

## 2019-10-28 MED ORDER — ORAL CARE MOUTH RINSE: 15.0000 mL | Freq: Once | OROMUCOSAL | Status: AC

## 2019-10-28 MED ORDER — AMISULPRIDE (ANTIEMETIC) 5 MG/2ML IV SOLN: 10.0000 mg | Freq: Once | INTRAVENOUS | Status: DC | PRN

## 2019-10-28 MED ORDER — EPINEPHRINE PF 1 MG/ML IJ SOLN
INTRAMUSCULAR | Status: DC | PRN
  Administered 2019-10-28: 1 mg via SUBCUTANEOUS

## 2019-10-28 MED ORDER — ACETAMINOPHEN 160 MG/5ML PO SOLN: 325.0000 mg | Freq: Once | ORAL | Status: DC | PRN

## 2019-10-28 MED ORDER — FENTANYL CITRATE (PF) 100 MCG/2ML IJ SOLN
25.0000 ug | INTRAMUSCULAR | Status: DC | PRN
  Administered 2019-10-28 (×2): 25 ug via INTRAVENOUS

## 2019-10-28 MED ORDER — PROPOFOL 10 MG/ML IV BOLUS
INTRAVENOUS | Status: DC | PRN
  Administered 2019-10-28: 200 mg via INTRAVENOUS

## 2019-10-28 MED ORDER — HYDROCODONE-ACETAMINOPHEN 5-325 MG PO TABS
1.0000 | ORAL_TABLET | Freq: Four times a day (QID) | ORAL | Status: DC | PRN
  Administered 2019-10-28: 1 via ORAL

## 2019-10-28 MED ORDER — LACTATED RINGERS IV SOLN: INTRAVENOUS | Status: DC

## 2019-10-28 MED ORDER — CHLORHEXIDINE GLUCONATE 0.12 % MT SOLN
15.0000 mL | Freq: Once | OROMUCOSAL | Status: AC
  Administered 2019-10-28: 15 mL via OROMUCOSAL
  Filled 2019-10-28: qty 15

## 2019-10-28 MED ORDER — ACETAMINOPHEN 10 MG/ML IV SOLN: 1000.0000 mg | Freq: Once | INTRAVENOUS | Status: DC | PRN

## 2019-10-28 MED ORDER — PHENYLEPHRINE HCL (PRESSORS) 10 MG/ML IV SOLN
INTRAVENOUS | Status: DC | PRN
  Administered 2019-10-28 (×3): 80 ug via INTRAVENOUS

## 2019-10-28 MED ORDER — FENTANYL CITRATE (PF) 100 MCG/2ML IJ SOLN
INTRAMUSCULAR | Status: DC | PRN
  Administered 2019-10-28 (×5): 50 ug via INTRAVENOUS

## 2019-10-28 MED ORDER — LIDOCAINE 2% (20 MG/ML) 5 ML SYRINGE
INTRAMUSCULAR | Status: DC | PRN
  Administered 2019-10-28: 20 mg via INTRAVENOUS
  Administered 2019-10-28: 40 mg via INTRAVENOUS

## 2019-10-28 MED ORDER — CEFAZOLIN SODIUM-DEXTROSE 2-4 GM/100ML-% IV SOLN
2.0000 g | INTRAVENOUS | Status: AC
  Administered 2019-10-28: 2 g via INTRAVENOUS
  Filled 2019-10-28: qty 100

## 2019-10-28 MED ORDER — MIDAZOLAM HCL 5 MG/5ML IJ SOLN
INTRAMUSCULAR | Status: DC | PRN
  Administered 2019-10-28: 2 mg via INTRAVENOUS

## 2019-10-28 MED ORDER — LABETALOL HCL 5 MG/ML IV SOLN
INTRAVENOUS | Status: AC
  Filled 2019-10-28: qty 4

## 2019-10-28 MED ORDER — PROPOFOL 10 MG/ML IV BOLUS
INTRAVENOUS | Status: AC
  Filled 2019-10-28: qty 20

## 2019-10-28 MED ORDER — ACETAMINOPHEN 325 MG PO TABS: 325.0000 mg | ORAL_TABLET | Freq: Once | ORAL | Status: DC | PRN

## 2019-10-28 MED ORDER — 0.9 % SODIUM CHLORIDE (POUR BTL) OPTIME
TOPICAL | Status: DC | PRN
  Administered 2019-10-28: 1000 mL

## 2019-10-28 MED ORDER — BUPIVACAINE HCL (PF) 0.25 % IJ SOLN
INTRAMUSCULAR | Status: AC
  Filled 2019-10-28: qty 30

## 2019-10-28 MED ORDER — LABETALOL HCL 5 MG/ML IV SOLN
5.0000 mg | INTRAVENOUS | Status: AC | PRN
  Administered 2019-10-28 (×3): 5 mg via INTRAVENOUS

## 2019-10-28 MED ORDER — EPINEPHRINE PF 1 MG/ML IJ SOLN
INTRAMUSCULAR | Status: AC
  Filled 2019-10-28: qty 1

## 2019-10-28 MED ORDER — BUPIVACAINE HCL 0.25 % IJ SOLN
INTRAMUSCULAR | Status: DC | PRN
  Administered 2019-10-28: 30 mL

## 2019-10-28 MED ORDER — HYDROCODONE-ACETAMINOPHEN 5-325 MG PO TABS: 1.0000 | ORAL_TABLET | Freq: Four times a day (QID) | ORAL | 0 refills | Status: AC | PRN

## 2019-10-28 SURGICAL SUPPLY — 56 items
BAG DECANTER FOR FLEXI CONT (MISCELLANEOUS) ×3 IMPLANT
BLADE CLIPPER SURG (BLADE) IMPLANT
BLADE DERMATOME SS (BLADE) ×3 IMPLANT
BNDG ELASTIC 4X5.8 VLCR STR LF (GAUZE/BANDAGES/DRESSINGS) IMPLANT
BNDG ELASTIC 6X5.8 VLCR STR LF (GAUZE/BANDAGES/DRESSINGS) IMPLANT
BNDG GAUZE ELAST 4 BULKY (GAUZE/BANDAGES/DRESSINGS) IMPLANT
BRUSH SCRUB EZ PLAIN DRY (MISCELLANEOUS) ×6 IMPLANT
CANISTER SUCT 3000ML PPV (MISCELLANEOUS) IMPLANT
CANISTER WOUND CARE 500ML ATS (WOUND CARE) IMPLANT
COVER SURGICAL LIGHT HANDLE (MISCELLANEOUS) ×3 IMPLANT
DERMACARRIERS GRAFT 1 TO 1.5 (DISPOSABLE) ×3
DRAPE EXTREMITY T 121X128X90 (DISPOSABLE) IMPLANT
DRAPE HALF SHEET 40X57 (DRAPES) ×6 IMPLANT
DRAPE INCISE IOBAN 66X45 STRL (DRAPES) ×6 IMPLANT
DRAPE ORTHO SPLIT 77X108 STRL (DRAPES) ×6
DRAPE SURG ORHT 6 SPLT 77X108 (DRAPES) ×2 IMPLANT
DRSG CALCIUM ALGINATE 4X4 (GAUZE/BANDAGES/DRESSINGS) ×3 IMPLANT
DRSG MEPITEL 4X7.2 (GAUZE/BANDAGES/DRESSINGS) IMPLANT
DRSG OPSITE 6X11 MED (GAUZE/BANDAGES/DRESSINGS) IMPLANT
DRSG PAD ABDOMINAL 8X10 ST (GAUZE/BANDAGES/DRESSINGS) ×6 IMPLANT
DRSG VAC ATS LRG SENSATRAC (GAUZE/BANDAGES/DRESSINGS) IMPLANT
DRSG VAC ATS MED SENSATRAC (GAUZE/BANDAGES/DRESSINGS) IMPLANT
DRSG VAC ATS SM SENSATRAC (GAUZE/BANDAGES/DRESSINGS) IMPLANT
ELECT REM PT RETURN 9FT ADLT (ELECTROSURGICAL) ×3
ELECTRODE REM PT RTRN 9FT ADLT (ELECTROSURGICAL) ×1 IMPLANT
FILTER STRAW FLUID ASPIR (MISCELLANEOUS) ×3 IMPLANT
GAUZE SPONGE 4X4 12PLY STRL (GAUZE/BANDAGES/DRESSINGS) ×6 IMPLANT
GAUZE XEROFORM 5X9 LF (GAUZE/BANDAGES/DRESSINGS) ×3 IMPLANT
GLOVE BIOGEL M STRL SZ7.5 (GLOVE) ×6 IMPLANT
GLOVE INDICATOR 8.0 STRL GRN (GLOVE) ×3 IMPLANT
GOWN STRL REUS W/ TWL LRG LVL3 (GOWN DISPOSABLE) ×3 IMPLANT
GOWN STRL REUS W/TWL LRG LVL3 (GOWN DISPOSABLE) ×9
GRAFT DERMACARRIERS 1 TO 1.5 (DISPOSABLE) ×1 IMPLANT
HANDPIECE INTERPULSE COAX TIP (DISPOSABLE) ×3
IV NS 1000ML (IV SOLUTION) ×3
IV NS 1000ML BAXH (IV SOLUTION) ×1 IMPLANT
KIT BASIN OR (CUSTOM PROCEDURE TRAY) ×3 IMPLANT
KIT TURNOVER KIT B (KITS) ×3 IMPLANT
NEEDLE SPNL 18GX3.5 QUINCKE PK (NEEDLE) ×6 IMPLANT
NS IRRIG 1000ML POUR BTL (IV SOLUTION) ×3 IMPLANT
PACK GENERAL/GYN (CUSTOM PROCEDURE TRAY) ×3 IMPLANT
PAD ARMBOARD 7.5X6 YLW CONV (MISCELLANEOUS) ×6 IMPLANT
SET HNDPC FAN SPRY TIP SCT (DISPOSABLE) ×1 IMPLANT
SPONGE LAP 18X18 RF (DISPOSABLE) ×3 IMPLANT
STAPLER VISISTAT 35W (STAPLE) IMPLANT
SUT CHROMIC 4 0 PS 2 18 (SUTURE) IMPLANT
SUT ETHILON 2 0 PSLX (SUTURE) ×9 IMPLANT
SUT PDS AB 3-0 SH 27 (SUTURE) IMPLANT
SUT VIC AB 3-0 SH 27 (SUTURE) ×12
SUT VIC AB 3-0 SH 27X BRD (SUTURE) ×4 IMPLANT
SYR 50ML LL SCALE MARK (SYRINGE) ×6 IMPLANT
SYR CONTROL 10ML LL (SYRINGE) ×3 IMPLANT
TAPE CLOTH SURG 6X10 WHT LF (GAUZE/BANDAGES/DRESSINGS) ×3 IMPLANT
TOWEL GREEN STERILE (TOWEL DISPOSABLE) ×3 IMPLANT
TOWEL GREEN STERILE FF (TOWEL DISPOSABLE) ×3 IMPLANT
UNDERPAD 30X36 HEAVY ABSORB (UNDERPADS AND DIAPERS) IMPLANT

## 2019-10-28 NOTE — Discharge Instructions (Addendum)
Activity As tolerated: NO showers until 1 week after your surgery or until you are evaluated in our plastic surgery office and dressing over your skin graft is removed. NO driving No heavy activities. No heavy lifting.  Diet: Regular  Wound Care: Keep dressing clean & dry Do not change dressings. Do not remove yellow gauze (xeroform) dressing over skin graft site - this is sutured in place. You may apply new gauze as needed due to drainage.   PAIN CONTROL: Take Norco 5-325 as needed every 6 hours. If you are taking this, do not also take additional tylenol. Norco has 325mg  of tylenol in it  Special Instructions:  Call Doctor if any unusual problems occur such as pain, excessive Bleeding, unrelieved Nausea/vomiting, Fever &/or chills  Follow-up appointment: Scheduled for next week.

## 2019-10-28 NOTE — Transfer of Care (Signed)
Immediate Anesthesia Transfer of Care Note  Patient: Kimberly Orozco  Procedure(s) Performed: Debridement of right thigh wound with placement of split thickness skin graft (Right Thigh)  Patient Location: PACU  Anesthesia Type:General  Level of Consciousness: awake, alert  and oriented  Airway & Oxygen Therapy: Patient Spontanous Breathing and Patient connected to face mask oxygen  Post-op Assessment: Report given to RN and Post -op Vital signs reviewed and stable  Post vital signs: Reviewed and stable  Last Vitals:  Vitals Value Taken Time  BP 146/96 10/28/19 1227  Temp    Pulse 69 10/28/19 1230  Resp 21 10/28/19 1230  SpO2 81 % 10/28/19 1230  Vitals shown include unvalidated device data.  Last Pain:  Vitals:   10/28/19 0936  TempSrc:   PainSc: 0-No pain         Complications: No complications documented.

## 2019-10-28 NOTE — Op Note (Signed)
Operative Note   DATE OF OPERATION: 10/28/2019  SURGICAL DEPARTMENT: Plastic Surgery  PREOPERATIVE DIAGNOSES: Right thigh wound  POSTOPERATIVE DIAGNOSES:  same  PROCEDURE: 1.  Surgical preparation for skin graft right thigh wound 22 x 6 cm 2.  Split-thickness skin graft right thigh wound totaling 20 x 6 cm  SURGEON: Ancil Linsey, MD  ASSISTANT: Zadie Cleverly, PA The advanced practice practitioner (APP) assisted throughout the case.  The APP was essential in retraction and counter traction when needed to make the case progress smoothly.  This retraction and assistance made it possible to see the tissue plans for the procedure.  The assistance was needed for blood control, tissue re-approximation and assisted with closure of the incision site.  ANESTHESIA:  General.   COMPLICATIONS: None.   INDICATIONS FOR PROCEDURE:  The patient, Kimberly Orozco is a 44 y.o. 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 MRN: [id number]  CONSENT:  Informed consent was obtained directly from the patient. Risks, benefits and alternatives were fully discussed. Specific risks including but not limited to bleeding, infection, hematoma, seroma, scarring, pain, contracture, asymmetry, wound healing problems, and need for further surgery were all discussed. The patient did have an ample opportunity to have questions answered to satisfaction.   DESCRIPTION OF PROCEDURE:  The patient was taken to the operating room. SCDs were placed and Ancef antibiotics were given.  General anesthesia was administered.  The patient's operative site was prepped and draped in a sterile fashion. A time out was performed and all information was confirmed to be correct.  I started by measuring up the wound.  There is approximately 22 x 6 cm oriented obliquely in the right thigh crease.  There is healthy granulation tissue present.  I started by preparing  it for grafting.  This was done primarily with the pulse lavage.  Gentle debridement was done of the colonized granulation tissue on the surface of the wound with the bevel of a knife.  Hemostasis was obtained.  I then marked out a corresponding sized area in the lower lateral aspect of the right thigh.  It was infiltrated with tumescent solution.  A split-thickness skin graft was then harvested with a dermatome at 14 thousandths of an inch.  The graft was then meshed 1.5-1 and inset onto the wound with interrupted 3-0 Vicryl sutures.  Bolster was then fashioned with Xeroform, scrub brushes, and 2-0 nylon.  Donor site was dressed with calcium alginate and Ioban.  The whole thigh was then covered with 4 x 4's and ABDs.  The patient tolerated the procedure well.  There were no complications. The patient was allowed to wake from anesthesia, extubated and taken to the recovery room in satisfactory condition.

## 2019-10-28 NOTE — Telephone Encounter (Addendum)
For this week, patient was advised to not change dressings/shower. She can change 4 x 4 gauze/ABD pad dressings as needed depending on soil level. She should not remove the yellow Xeroform bolster that was placed over skin graft recipient site on her right upper thigh or the wound dressing on the right lateral thigh.  The results will stay in place for 1 week and she will follow-up in our office next week for reevaluation.  We will remove the bolster at that time (11/06/2019)  Prior to next week appointment on 11/06/2019, would like home health to continue to evaluate patient twice this week, 10/29/2019 and 10/31/2019 to evaluate dressing and provide any dressing care as needed.  The following protocols will be used after 1 week:  Right upper thigh skin graft recipient site: Recommend home health apply Xeroform every other day to right upper thigh split-thickness skin graft recipient site starting Saturday, 11/08/2019. Cover Xeroform with 4 x 4 gauze, ABD pad.  Secure with Medipore tape.  When removing Xeroform, recommend doing this gently advised to not shear or tear skin graft.  Right thigh skin graft donor site: Patient currently has Ioban dressing in place over alginate, this will remain in place for 1 week.  4 x 4 gauze and ABD pads applied over Ioban may need to be changed depending on soil level.  Apply Xeroform every other day to right thigh split-thickness skin graft donor site starting Saturday, 11/08/2019 cover Xeroform with 4 x 4 gauze ABD pad, secure with Medipore.   If patient cannot be evaluated on 11/08/2019, would like home health to be available on 11/07/2019 to change dressing.

## 2019-10-28 NOTE — Telephone Encounter (Signed)
Deanna from [facility] called to find out if patient is still a patient of ours and if an order can be sent to resume home health. She can be reached at [phone].

## 2019-10-28 NOTE — Anesthesia Procedure Notes (Signed)
Procedure Name: LMA Insertion Date/Time: 10/28/2019 11:09 AM Performed by: Sheppard Evens, CRNA Pre-anesthesia Checklist: Patient identified, Emergency Drugs available, Suction available and Patient being monitored Patient Re-evaluated:Patient Re-evaluated prior to induction Oxygen Delivery Method: Circle System Utilized Preoxygenation: Pre-oxygenation with 100% oxygen Induction Type: IV induction Ventilation: Mask ventilation without difficulty LMA: LMA inserted LMA Size: 4.0 Number of attempts: 1 Airway Equipment and Method: Bite block Placement Confirmation: positive ETCO2 Tube secured with: Tape Dental Injury: Teeth and Oropharynx as per pre-operative assessment

## 2019-10-28 NOTE — Anesthesia Preprocedure Evaluation (Signed)
Anesthesia Evaluation  Patient identified by MRN, date of birth, ID band Patient awake    Reviewed: Allergy & Precautions, NPO status , Patient's Chart, lab work & pertinent test results  Airway Mallampati: III  TM Distance: >3 FB   Mouth opening: Limited Mouth Opening  Dental  (+) Poor Dentition, Chipped, Missing, Dental Advisory Given,    Pulmonary Current Smoker and Patient abstained from smoking.,    Pulmonary exam normal        Cardiovascular hypertension,  Rhythm:Regular Rate:Normal     Neuro/Psych PSYCHIATRIC DISORDERS Bipolar Disorder Schizophrenia    GI/Hepatic negative GI ROS, Neg liver ROS,   Endo/Other  diabetes  Renal/GU      Musculoskeletal negative musculoskeletal ROS (+)   Abdominal (+) + obese,   Peds  Hematology negative hematology ROS (+)   Anesthesia Other Findings   Reproductive/Obstetrics                             Anesthesia Physical  Anesthesia Plan  ASA: III  Anesthesia Plan: General   Post-op Pain Management:    Induction: Intravenous  PONV Risk Score and Plan: 4 or greater and Ondansetron, Midazolam and Treatment may vary due to age or medical condition  Airway Management Planned: LMA  Additional Equipment: None  Intra-op Plan:   Post-operative Plan: Extubation in OR  Informed Consent: I have reviewed the patients History and Physical, chart, labs and discussed the procedure including the risks, benefits and alternatives for the proposed anesthesia with the patient or authorized representative who has indicated his/her understanding and acceptance.     Dental advisory given  Plan Discussed with: CRNA  Anesthesia Plan Comments: (EKG: sinus rhythm. )        Anesthesia Quick Evaluation

## 2019-10-28 NOTE — H&P (Signed)
Reason for Consult/CC:Right Thigh wound  Kimberly Orozco is an 44 y.o. female.  HPI: Right thigh wound from infection.  Ready for skin graft for closure.  Allergies: No Known Allergies  Medications:  Current Facility-Administered Medications:  .  ceFAZolin (ANCEF) IVPB 2g/100 mL premix, 2 g, Intravenous, On Call to OR, Allena Napoleon, MD .  chlorhexidine (PERIDEX) 0.12 % solution 15 mL, 15 mL, Mouth/Throat, Once **OR** MEDLINE mouth rinse, 15 mL, Mouth Rinse, Once, Shelton Silvas, MD .  lactated ringers infusion, , Intravenous, Continuous, Shelton Silvas, MD .  lactated ringers infusion, , Intravenous, Continuous, Shelton Silvas, MD  Past Medical History:  Diagnosis Date  . AKI (acute kidney injury) (HCC)   . Bipolar disorder (HCC)   . Diabetes mellitus without complication (HCC)   . Hypertension    not on medications  . Schizophrenia [facility])     Past Surgical History:  Procedure Laterality Date  . APPLICATION OF A-CELL OF EXTREMITY N/A 07/08/2019   Procedure: with placement of primatrix AG;  Surgeon: Allena Napoleon, MD;  Location: [facility] OR;  Service: Plastics;  Laterality: N/A;  . APPLICATION OF A-CELL OF EXTREMITY Right 07/22/2019   Procedure: placement of primatrix mesh;  Surgeon: Allena Napoleon, MD;  Location: [facility] OR;  Service: Plastics;  Laterality: Right;  . INCISION AND DRAINAGE OF WOUND N/A 06/26/2019   Procedure: IRRIGATION AND DEBRIDEMENT ABDOMINAL WALL AND GROIN;  Surgeon: Axel Filler, MD;  Location: [facility] OR;  Service: General;  Laterality: N/A;  . INCISION AND DRAINAGE OF WOUND N/A 07/08/2019   Procedure: Debridement of abdominal and groin wound;  Surgeon: Allena Napoleon, MD;  Location: [facility] OR;  Service: Plastics;  Laterality: N/A;  . INCISION AND DRAINAGE PERIRECTAL ABSCESS Right 06/24/2019   Procedure: IRRIGATION AND DEBRIDEMENT RIGHT GROIN AND RIGHT BUTTOCK;  Surgeon: Axel Filler, MD;  Location: [facility] OR;  Service: General;  Laterality: Right;  . IRRIGATION AND  DEBRIDEMENT OF WOUND WITH SPLIT THICKNESS SKIN GRAFT Right 07/22/2019   Procedure: Debridement of right thigh wound;  Surgeon: Allena Napoleon, MD;  Location: [facility] OR;  Service: Plastics;  Laterality: Right;    Family History  Family history unknown: Yes    Social History:  reports that she has been smoking cigarettes. She has never used smokeless tobacco. She reports previous alcohol use. She reports that she does not use drugs.  Physical Exam Blood pressure (!) 155/99, pulse 90, temperature 98 F (36.7 C), temperature source Oral, resp. rate 18, height 5\' 6"  (1.676 m), weight 93 kg, SpO2 100 %. General: NAD, AOx3 Unlabored resp Regular rhythm R thigh wound w healthy granulation  Results for orders placed or performed during the hospital encounter of 10/28/19 (from the past 48 hour(s))  Glucose, capillary     Status: Abnormal   Collection Time: 10/28/19  9:19 AM  Result Value Ref Range   Glucose-Capillary 119 (H) 70 - 99 mg/dL    Comment: Glucose reference range applies only to samples taken after fasting for at least 8 hours.    No results found.  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  R&B discussed.  Proceed w surgery.  10/30/19 10/28/2019, 9:28 AM

## 2019-10-28 NOTE — Progress Notes (Signed)
Wasted 1 cc Fentanyl with Rolland Bimler, RN .

## 2019-10-28 NOTE — Interval H&P Note (Signed)
History and Physical Interval Note:  10/28/2019 9:30 AM  Kimberly Orozco  has presented today for surgery, with the diagnosis of right thigh wound.  The various methods of treatment have been discussed with the patient and family. After consideration of risks, benefits and other options for treatment, the patient has consented to  Procedure(s) with comments: Debridement of right thigh wound with placement of STSG (Right) possible palcement of primatrix mesh (Right) - total case time is 2 hours as a surgical intervention.  The patient's history has been reviewed, patient examined, no change in status, stable for surgery.  I have reviewed the patient's chart and labs.  Questions were answered to the patient's satisfaction.     Kimberly Orozco

## 2019-10-28 NOTE — Brief Op Note (Signed)
10/28/2019  12:28 PM  PATIENT:  [name]  44 y.o. female  PRE-OPERATIVE DIAGNOSIS:  right thigh wound  POST-OPERATIVE DIAGNOSIS:  right thigh wound  PROCEDURE:  Procedure(s): Debridement of right thigh wound with placement of split thickness skin graft (Right)  SURGEON:  Surgeon(s) and Role:    * Henreitta Spittler, Wendy Poet, MD - Primary  PHYSICIAN ASSISTANT: Materials engineer, PA  ASSISTANTS: none   ANESTHESIA:   general  EBL:  20 mL   BLOOD ADMINISTERED:none  DRAINS: none   LOCAL MEDICATIONS USED:  MARCAINE     SPECIMEN:  No Specimen  DISPOSITION OF SPECIMEN:  N/A  COUNTS:  YES  TOURNIQUET:  * No tourniquets in log *  DICTATION: .Dragon Dictation  PLAN OF CARE: Discharge to home after PACU  PATIENT DISPOSITION:  PACU - hemodynamically stable.   Delay start of Pharmacological VTE agent (>24hrs) due to surgical blood loss or risk of bleeding: not applicable

## 2019-10-28 NOTE — Anesthesia Postprocedure Evaluation (Signed)
Anesthesia Post Note  Patient: Kimberly Orozco  Procedure(s) Performed: Debridement of right thigh wound with placement of split thickness skin graft (Right Thigh)     Patient location during evaluation: PACU Anesthesia Type: General Level of consciousness: awake and alert Pain management: pain level controlled Vital Signs Assessment: post-procedure vital signs reviewed and stable Respiratory status: spontaneous breathing, nonlabored ventilation, respiratory function stable and patient connected to nasal cannula oxygen Cardiovascular status: blood pressure returned to baseline and stable Postop Assessment: no apparent nausea or vomiting Anesthetic complications: no   No complications documented.  Last Vitals:  Vitals:   10/28/19 1345 10/28/19 1358  BP: (!) 192/107 (!) 194/99  Pulse: 75 72  Resp: 20 (!) 21  Temp:  (!) 36.3 C  SpO2: 100% 100%    Last Pain:  Vitals:   10/28/19 1358  TempSrc:   PainSc: 4                  Shelton Silvas

## 2019-10-29 ENCOUNTER — Telehealth: Payer: Self-pay | Admitting: Plastic Surgery

## 2019-10-29 ENCOUNTER — Encounter (HOSPITAL_COMMUNITY): Payer: Self-pay | Admitting: Plastic Surgery

## 2019-10-29 NOTE — Telephone Encounter (Signed)
Faxed Matt's current order for wound care to [facility] [phone]  

## 2019-10-29 NOTE — Telephone Encounter (Signed)
Faxed Matt's current order for wound care to [facility] [phone]

## 2019-10-29 NOTE — Telephone Encounter (Signed)
Kimberly Orozco from [facility] called to find out if they needed to leave the dressing on and we remove it at her follow up visit in one week. Also, she would like to know if we will call them with updated orders after her appt. Please call to advise. [phone]

## 2019-11-05 ENCOUNTER — Encounter: Payer: Self-pay | Admitting: Plastic Surgery

## 2019-11-05 ENCOUNTER — Ambulatory Visit (INDEPENDENT_AMBULATORY_CARE_PROVIDER_SITE_OTHER): Payer: Medicare Other | Admitting: Surgical

## 2019-11-05 ENCOUNTER — Other Ambulatory Visit: Payer: Self-pay

## 2019-11-05 VITALS — BP 127/89 | HR 86 | Temp 98.7°F

## 2019-11-05 DIAGNOSIS — S71101D Unspecified open wound, right thigh, subsequent encounter: Secondary | ICD-10-CM

## 2019-11-05 NOTE — Progress Notes (Signed)
Patient is a 44 year old female here for follow-up with her mother after debridement of right thigh wound with placement of split-thickness skin graft on 10/28/2019 with Dr. Arita Miss.  Patient reports overall she has been doing well, denies any fever, chills, nausea, vomiting.  Does not complain of any pain.  Chaperone present on exam Donor site with healthy base of granulation tissue noted and some new pink epithelium noted at the edges.  No purulence noted.  No cellulitic changes noted.   After removal of bolster, split-thickness skin graft in place with some areas of sloughing skin graft noted.  Foul odor noted.  No purulence noted.  A fair amount of the skin graft has taken with some areas at the periphery that have sloughed off.  No purulence or cellulitic changes noted.  The area is minimally tender to palpation.  Discussed with patient that dressing changes with home health showed consist of every other day Xeroform, 4 x 4 gauze, ABD pads and Medipore tape to both wounds. These instructions were copied down for patient to provide to home health RN.  Clinical team also made a copy for reference to fax to home health RNs as needed.  Discussed with patient and mother to call if any questions or concerns or if she develops any concerning symptoms such as fevers, chills, nausea, vomiting, purulent drainage or any other concerns. All of the patient and mother's questions were answered to their content.  No sign of any active infection at this time. Recommend following up in 2 weeks for reevaluation.   Pictures were obtained of the patient and placed in the chart with the patient's or guardian's permission.

## 2019-11-06 ENCOUNTER — Telehealth: Payer: Self-pay

## 2019-11-06 ENCOUNTER — Telehealth: Payer: Self-pay | Admitting: *Deleted

## 2019-11-06 NOTE — Telephone Encounter (Signed)
Received physician orders from [facility] via of fax requesting signature and return.  Given to provider to complete.  Orders signed and faxed back to [facility].  Confirmation received and copy scanned into the chart.//AB/CMA

## 2019-11-06 NOTE — Telephone Encounter (Signed)
I called and spoke with Lanora Manis from [facility].  I provided her instructions for nursing staff to do dressing changes every other day.  Dressing changes will consist of Xeroform followed by 4 x 4 gauze, ABD pads, secure with Medipore tape.  I discussed with Lanora Manis that nursing staff should avoid scrubbing the skin graft recipient site.  They can cleanse around the site if patient has any buildup or malodorous drainage due to it being within her abdominal crease.  Lanora Manis documented these instructions and reported they would update [place] with any questions or concerns.  Appreciate their help

## 2019-11-06 NOTE — Telephone Encounter (Signed)
Received call from nurse manager, Lanora Manis from [facility]. She is requesting call back from [place], [place] regarding clarification on wound care orders. [phone].

## 2019-11-13 ENCOUNTER — Telehealth: Payer: Self-pay | Admitting: *Deleted

## 2019-11-13 NOTE — Telephone Encounter (Signed)
Received physician orders via of fax from [facility].  Requesting signature and return.  Given to provider to complete.    Orders signed and faxed back to [facility].  Confirmation received and copy scanned into the chart.//AB/CMA

## 2019-11-14 ENCOUNTER — Encounter: Payer: Medicare Other | Admitting: Internal Medicine

## 2019-11-20 ENCOUNTER — Ambulatory Visit (INDEPENDENT_AMBULATORY_CARE_PROVIDER_SITE_OTHER): Payer: Medicare Other | Admitting: Surgical

## 2019-11-20 ENCOUNTER — Encounter: Payer: Self-pay | Admitting: Surgical

## 2019-11-20 ENCOUNTER — Other Ambulatory Visit: Payer: Self-pay

## 2019-11-20 VITALS — BP 166/102 | HR 106 | Temp 99.0°F

## 2019-11-20 DIAGNOSIS — S71101D Unspecified open wound, right thigh, subsequent encounter: Secondary | ICD-10-CM

## 2019-11-20 NOTE — Progress Notes (Signed)
Subjective:     Patient ID: Kimberly Orozco, female    DOB: 05-10-1975, 44 y.o.   MRN: [id number]  Chief Complaint  Patient presents with  . Follow-up    HPI: The patient is a 44 y.o. female here for follow-up after split-thickness skin graft to right thigh/groin wound with Dr. Arita Miss.  Patient is here with her mother.  Patient has been receiving wound care assistance from home health, Xeroform dressing changes every other day.  Patient reports overall everything has been going okay, she and her mother do report that they have been seeing a PCP for management of her chronic medical conditions.  Mother reports that she feels as if the patient is diabetic, however she notes that the patient is no longer on any diabetic medications.  Mother reports patient did have to go to the hospital in the last 3 weeks or so for evaluation of some diabetic issues.  Mother also reports that she feels as if the patient's high blood pressure is untreated and is planning to call her PCP today to determine if she needs to be evaluated.  During today's evaluation today patient reports that she has had some bilateral lower extremity pain, mostly in her bilateral thighs.  She reports that a few weeks ago she did fall while she was having some issues with dosing of her Haldol, the dosing has been resolved, however she continues to have some pain in her bilateral lower extremities.  She is unsure if it is related to the fall or not.  She has been active at home, endorses not being sedentary for extended periods of time.  She has not had any swelling in her or lower extremities below the knee, but does feel as if she has some swelling in her right and left thigh, right greater than left and right knee.  He is not having any shortness of breath, chest pain, weakness, pleuritic chest pain.  She does note that she has had some visual changes, including blurry vision, no vision loss.  She does not have any numbness or tingling of her  face.  She denies any dizziness, she has no gait or abnormalities.  She has not had any fevers, chills, nausea, vomiting.  Review of Systems  Constitutional: Negative for chills and fever.  Eyes: Positive for visual disturbance (Blurry vision).  Respiratory: Negative for chest tightness and shortness of breath.   Cardiovascular: Negative for chest pain, palpitations and leg swelling.  Gastrointestinal: Negative for nausea and vomiting.  Musculoskeletal: Negative for gait problem.  Skin: Positive for color change and wound. Negative for rash.  Neurological: Negative for dizziness, facial asymmetry, weakness, light-headedness and headaches.     Objective:   Vital Signs BP (!) 166/102 (BP Location: Left Arm, Patient Position: Sitting, Cuff Size: Large)   Pulse (!) 106   Temp 99 F (37.2 C) (Oral)   LMP 11/03/2019 (Exact Date)   SpO2 100%  Vital Signs and Nursing Note Reviewed Chaperone present Physical Exam Constitutional:      General: She is not in acute distress.    Appearance: Normal appearance. She is obese. She is not ill-appearing, toxic-appearing or diaphoretic.  HENT:     Head: Normocephalic and atraumatic.  Eyes:     Conjunctiva/sclera: Conjunctivae normal.     Pupils: Pupils are equal, round, and reactive to light.  Cardiovascular:     Pulses: Normal pulses.  Pulmonary:     Effort: Pulmonary effort is normal. No respiratory distress.  Abdominal:  General: Abdomen is flat. There is no distension.     Tenderness: There is no abdominal tenderness. There is no guarding.  Musculoskeletal:        General: Tenderness present. Normal range of motion.     Right lower leg: No edema.     Left lower leg: No edema.     Right ankle: No swelling.     Left ankle: No swelling.     Right foot: No swelling or tenderness.     Left foot: No swelling or tenderness.       Legs:  Skin:    General: Skin is warm and dry.  Neurological:     General: No focal deficit present.       Mental Status: She is alert and oriented to person, place, and time. Mental status is at baseline.     Motor: No weakness.     Gait: Gait normal.  Psychiatric:        Mood and Affect: Mood normal.        Behavior: Behavior normal.       Assessment/Plan:     ICD-10-CM   1. Open thigh wound, right, subsequent encounter  S71.101D    In regards to her right groin/thigh split-thickness skin graft, patient is doing really well.  There is no sign of infection, donor site is healing well.  She has a few small areas of granulation tissue exposed, these are less than 3 mm each, recommend continuing with Xeroform gauze dressing changes every other day with the assistance of home health.  Recommend Vaseline to right thigh donor site daily.  Cover with 4 x 4 gauze and ABD pad.  In regards to the patient's other medical conditions, including her high blood pressure and blurry vision I recommend patient call her PCP today for an evaluation.  Patient reported that she has had blurry vision for a few weeks, this has not been discussed with her PCP.  She does not have any other concerning symptoms with her vision.  She does not have any dizziness, weakness, numbness, tingling, chest pain, shortness of breath, headaches, vision loss.  Patient's blood pressure is elevated today, recommend calling PCP to discuss management of her blood pressure and need for possible medication management, patient is mostly asymptomatic with the exception of blurry vision which has been ongoing for the last 3 weeks and is not worse today.  I thoroughly discussed with patient and her mother that they need to be evaluated by PCP to further discuss these issues, I also discussed ED precautions including chest pain, shortness of breath, worsening vision, numbness/tingling, headache, inspiratory chest pain.  Patient and mother agree and understand, mother reports that she is going to call PCP as soon as they leave today's appointment  to schedule an evaluation.  I discussed with patient and mother DVT/PE symptoms, recommend being evaluated in the ED if she has any worsening bilateral lower extremity pain, swelling of her feet, chest pain, shortness of breath, fatigue. Unsure etiology of bilateral lower extremity/thigh pain, no sign of DVT today on evaluation.  I recommend they call us with any questions or concerns.  Pictures were obtained of the patient and placed in the chart with the patient's or guardian's permission.  Follow-up scheduled for 2 weeks   Kermit Balo Sharice Harriss, PA-C 11/20/2019, 5:20 PM

## 2019-11-25 ENCOUNTER — Encounter: Payer: Medicare Other | Admitting: Internal Medicine

## 2019-11-25 NOTE — Progress Notes (Deleted)
   CC: ***  HPI:  [name] is a 44 y.o. with PMH as below.   Please see A&P for assessment of the patient's acute and chronic medical conditions.   Hypertension   Past Medical History:  Diagnosis Date  . AKI (acute kidney injury) (HCC)   . Bipolar disorder (HCC)   . Diabetes mellitus without complication (HCC)   . Hypertension    not on medications  . Schizophrenia (HCC)    Review of Systems:  *** 10 point ROS negative except as noted in HPI  Physical Exam:   Constitution: NAD, appears stated age HENT: Eyes:  Cardio: RRR, no m/r/g, no LE edema  Respiratory: CTA, no w/r/r Abdominal: NTTP, soft, non-distended MSK: moving all extremities Neuro: normal affect, a&ox3 GU: Skin: c/d/i    There were no vitals filed for this visit. ***  Assessment & Plan:   See Encounters Tab for problem based charting.  Patient {GC/GE:[id number]::"discussed with","seen with"} Dr. {NAMES:[id number]::"Butcher","Granfortuna","E. Hoffman","Klima","Mullen","Narendra","Raines","Vincent"}

## 2019-11-26 ENCOUNTER — Telehealth: Payer: Self-pay | Admitting: *Deleted

## 2019-11-26 NOTE — Telephone Encounter (Signed)
Received Physician Orders via of fax from [facility].  Requesting signature and return.  Given to provider to complete.    Orders signed and faxed back to [facility].  Confirmation received and copy scanned into the chart.//AB/CMA

## 2019-11-27 ENCOUNTER — Ambulatory Visit (INDEPENDENT_AMBULATORY_CARE_PROVIDER_SITE_OTHER): Payer: Medicare Other | Admitting: Internal Medicine

## 2019-11-27 ENCOUNTER — Encounter: Payer: Self-pay | Admitting: Internal Medicine

## 2019-11-27 ENCOUNTER — Telehealth: Payer: Self-pay | Admitting: *Deleted

## 2019-11-27 VITALS — BP 164/99 | HR 95 | Temp 98.7°F | Ht 66.0 in | Wt 212.4 lb

## 2019-11-27 DIAGNOSIS — R519 Headache, unspecified: Secondary | ICD-10-CM

## 2019-11-27 DIAGNOSIS — E1169 Type 2 diabetes mellitus with other specified complication: Secondary | ICD-10-CM | POA: Diagnosis present

## 2019-11-27 DIAGNOSIS — I10 Essential (primary) hypertension: Secondary | ICD-10-CM | POA: Diagnosis present

## 2019-11-27 DIAGNOSIS — E669 Obesity, unspecified: Secondary | ICD-10-CM

## 2019-11-27 DIAGNOSIS — E1159 Type 2 diabetes mellitus with other circulatory complications: Secondary | ICD-10-CM

## 2019-11-27 MED ORDER — CHLORTHALIDONE 25 MG PO TABS: 25.0000 mg | ORAL_TABLET | Freq: Every day | ORAL | 0 refills | Status: DC

## 2019-11-27 NOTE — Assessment & Plan Note (Addendum)
BP Readings from Last 3 Encounters:  11/27/19 (!) 164/99  11/20/19 (!) 166/102  11/05/19 127/89  Patient presents to the office with elevated pressures. She was seen by her plastic surgeon and was also found to have pressures with a reading of 170/102 and 166/102. Patient admits that she has had pressure medications in the past, when she lived in [place], but has been off medications for a while. Her endorses headaches and chronic blurry vision. Her neuro examination was benign, as was her physical. She denies chest pain or abdominal pain. Headaches likely 2/2 from elevated pressures. She does endorse leg pain, but this is likely from her surgical visits with plastics. Will start on Hygroton today and have her follow up in 2 weeks.  - Hygroton 25 mg daily - baseline BMP ordered today. - Assess at next visit in 2 weeks

## 2019-11-27 NOTE — Patient Instructions (Addendum)
Kimberly Orozco,  It was a pleasure meeting you today. Today we discussed your high blood pressure. Your pressures are elevated today, we will start you on a hypertensive medication called hygroton. Please take one pill daily. We will have you follow up in 1 to 2 weeks to measure your blood pressure. Have a good day!  Sincerely,  Dolan Amen, MD

## 2019-11-27 NOTE — Telephone Encounter (Signed)
Received Resumption of Care via of fax on (10/28/19) requesting signature and return.  Given to provider.  Received back from provider stating the Resumption of Care needs to be sent to the patient's PCP to complete.    Faxed Resumption of Care faxed back to [facility].  Confirmation received.//AB/CMA

## 2019-11-27 NOTE — Progress Notes (Signed)
° °  CC: Headache/High Blood Pressure  HPI:  Kimberly Orozco is a 44 y.o. female, with a PMH noted below, who presents to the clinic for headaches and elevated pressures. To see the management of her acute and chronic conditions, please see the A&P note under the encounters tab.   Past Medical History:  Diagnosis Date   AKI (acute kidney injury) (HCC)    Bipolar disorder (HCC)    Diabetes mellitus without complication (HCC)    Hypertension    not on medications   Schizophrenia (HCC)    Review of Systems:   Review of Systems  Constitutional: Negative for chills and fever.  HENT: Negative for ear discharge and ear pain.   Eyes: Positive for blurred vision. Negative for double vision, photophobia and pain.  Cardiovascular: Negative for chest pain, palpitations and orthopnea.  Gastrointestinal: Negative for abdominal pain, constipation, diarrhea, nausea and vomiting.  Neurological: Positive for headaches. Negative for dizziness, tingling and tremors.     Physical Exam:  Vitals:   11/27/19 1414 11/27/19 1416 11/27/19 1419 11/27/19 1420  BP: (!) 153/100 (!) 147/99 (!) 161/104 (!) 164/99  Pulse: 93 88 89 95  Temp: 98.7 F (37.1 C)     SpO2: 100%     Weight: 212 lb 6.4 oz (96.3 kg)     Height: 5\' 6"  (1.676 m)      Physical Exam Vitals and nursing note reviewed.  Constitutional:      General: She is not in acute distress.    Appearance: Normal appearance. She is not ill-appearing or toxic-appearing.  HENT:     Head: Normocephalic and atraumatic.  Eyes:     General: No scleral icterus.       Right eye: No discharge.        Left eye: No discharge.     Extraocular Movements: Extraocular movements intact.     Conjunctiva/sclera: Conjunctivae normal.     Pupils: Pupils are equal, round, and reactive to light.  Cardiovascular:     Rate and Rhythm: Normal rate and regular rhythm.     Pulses: Normal pulses.     Heart sounds: Normal heart sounds. No murmur heard.  No  friction rub. No gallop.   Pulmonary:     Effort: Pulmonary effort is normal.     Breath sounds: Normal breath sounds. No wheezing, rhonchi or rales.  Abdominal:     General: Abdomen is flat. Bowel sounds are normal.     Tenderness: There is no abdominal tenderness. There is no guarding.  Musculoskeletal:     Right lower leg: No edema.     Left lower leg: No edema.     Comments: Strength 5/5 in the upper and lower extremities bilaterally  Skin:    General: Skin is warm.  Neurological:     General: No focal deficit present.     Mental Status: She is alert and oriented to person, place, and time.     Cranial Nerves: No cranial nerve deficit.     Motor: No weakness.     Comments: CN II-XII intact bilaterally   Psychiatric:        Mood and Affect: Mood normal.        Behavior: Behavior normal.     Assessment & Plan:   See Encounters Tab for problem based charting.  Patient discussed with Dr. 

## 2019-11-28 ENCOUNTER — Telehealth: Payer: Self-pay | Admitting: Surgical

## 2019-11-28 ENCOUNTER — Encounter: Payer: Self-pay | Admitting: Internal Medicine

## 2019-11-28 LAB — BMP8+ANION GAP
Anion Gap: 15 mmol/L (ref 10.0–18.0)
BUN/Creatinine Ratio: 11 (ref 9–23)
BUN: 8 mg/dL (ref 6–24)
CO2: 24 mmol/L (ref 20–29)
Calcium: 8.6 mg/dL — ABNORMAL LOW (ref 8.7–10.2)
Chloride: 102 mmol/L (ref 96–106)
Creatinine, Ser: 0.72 mg/dL (ref 0.57–1.00)
GFR calc Af Amer: 118 mL/min/{1.73_m2} (ref >59)
GFR calc non Af Amer: 102 mL/min/{1.73_m2} (ref >59)
Glucose: 95 mg/dL (ref 65–99)
Potassium: 3.6 mmol/L (ref 3.5–5.2)
Sodium: 141 mmol/L (ref 134–144)

## 2019-11-28 NOTE — Telephone Encounter (Signed)
Kimberly Orozco, [facility], called to say that patient refused home health and wound care and they are trying to discharge her. Please call her back at [phone]

## 2019-12-01 NOTE — Telephone Encounter (Signed)
Spoke with Kimberly Orozco and her supervisor, Lanora Manis from [facility]. They went to patients house for wound care. Mother and father were in the room with patient. Patient indicated she no longer needed home health care. Lanora Manis was trying to set up a 2 week follow up plan, but patient again refused care.

## 2019-12-02 NOTE — Progress Notes (Signed)
Internal Medicine Clinic Attending ? ?Case discussed with Dr. Winters  At the time of the visit.  We reviewed the resident?s history and exam and pertinent patient test results.  I agree with the assessment, diagnosis, and plan of care documented in the resident?s note.  ?

## 2019-12-04 ENCOUNTER — Other Ambulatory Visit: Payer: Self-pay

## 2019-12-04 ENCOUNTER — Ambulatory Visit (INDEPENDENT_AMBULATORY_CARE_PROVIDER_SITE_OTHER): Payer: Medicare Other | Admitting: Surgical

## 2019-12-04 ENCOUNTER — Encounter: Payer: Self-pay | Admitting: Surgical

## 2019-12-04 VITALS — BP 135/90 | HR 90 | Temp 99.2°F

## 2019-12-04 DIAGNOSIS — S71101D Unspecified open wound, right thigh, subsequent encounter: Secondary | ICD-10-CM

## 2019-12-04 DIAGNOSIS — S31109D Unspecified open wound of abdominal wall, unspecified quadrant without penetration into peritoneal cavity, subsequent encounter: Secondary | ICD-10-CM

## 2019-12-04 NOTE — Progress Notes (Signed)
Patient is a 44 year old female here for follow-up after split-thickness skin graft to right thigh/groin wound with Dr. Arita Miss.  Patient is here with her mother.  Patient was receiving assistance from home health, however she has recently refused their care and would no longer like to receive assistance from home health.  Patient reports she is doing well in regards to her right thigh wound.  She reports that sometimes the donor site gets irritated after taking a warm shower.  She reports she has not been applying any Vaseline or ointment to the area.  She has not been applying any dressings to the right groin wound/split-thickness skin graft recipient site.  She reports that she saw her PCP recently and was started on blood pressure medication as at her last visit with [place] she had a high blood pressure.  She also reports that she has an evaluation with her eye doctor for evaluation of blurry vision in the next week or so.  She reports her blurry vision has not worsened since our last visit.  On exam right thigh skin graft in place with good adherence.  She does continue to have 2 small wounds along the proximal aspect of the skin graft recipient site, these wounds are less than 5 mm each without any surrounding erythema.  Minimal improvement noted since previous visit on 11/20/2019.  Right thigh donor site without any surrounding erythema.  No wounds noted.  No foul odor is noted.  No purulent drainage noted.  Recommend applying xeroform daily to two small stsg wounds. Recommend aquaphor or unscented lotion to right thigh stsg donor site daily. Follow up scheduled for 3 weeks for re-evaluation.  Pictures were obtained of the patient and placed in the chart with the patient's or guardian's permission.

## 2019-12-11 ENCOUNTER — Ambulatory Visit (INDEPENDENT_AMBULATORY_CARE_PROVIDER_SITE_OTHER): Payer: Medicare Other | Admitting: Student

## 2019-12-11 ENCOUNTER — Encounter: Payer: Self-pay | Admitting: Student

## 2019-12-11 VITALS — BP 131/97 | HR 99 | Temp 98.4°F | Wt 213.5 lb

## 2019-12-11 DIAGNOSIS — Z0001 Encounter for general adult medical examination with abnormal findings: Secondary | ICD-10-CM | POA: Diagnosis present

## 2019-12-11 DIAGNOSIS — Z0000 Encounter for general adult medical examination without abnormal findings: Secondary | ICD-10-CM | POA: Insufficient documentation

## 2019-12-11 DIAGNOSIS — I10 Essential (primary) hypertension: Secondary | ICD-10-CM | POA: Diagnosis not present

## 2019-12-11 DIAGNOSIS — F200 Paranoid schizophrenia: Secondary | ICD-10-CM

## 2019-12-11 NOTE — Assessment & Plan Note (Signed)
Patient due for Hepatitis C screening. Patient denied flu vaccine. Patient received moderna COVID vaccination series.

## 2019-12-11 NOTE — Assessment & Plan Note (Addendum)
Patient has history of schizophrenia previously following with [facility]. Prior to establishing care with [facility], she was taking aripiprazole. She discontinued this medication due to onset of metabolic syndrome with new-onset diabetes (hemoglobin A1c of 14.4.) She was switched to haloperidol with resolution of her metabolic syndrome, repeat HbA1c <5, however she had an acute dystonic reaction while on this medication. She was encouraged to adhere to her prescribed benztropine which she was not taking, however she stopped taking the haloperidol due to concern of this side effect. She subsquently discontinued her psychiatry care with [facility] and is not interested in re-establishing with any facility associated with [facility]. At prior appointments, she was referred to Lysle Rubens who provided a highlighted list of psychiatry providers in the area that accept her insurance, however both patient and mother state that none of these facilities could accept Ms. Bakula due to her having Medicare/Medicaid. Although she has been off haloperidol, she reports feeling well. On physical examination, she is comfortable appearing, however she does have a flat affect. -Hold antipsychotics at this time -Provided patient and her mother a highlighted list of psychiatrists in the area that accept Medicare/Medicaid

## 2019-12-11 NOTE — Assessment & Plan Note (Signed)
Vitals:   12/11/19 1453  BP: (!) 131/97  Pulse: 99  Temp: 98.4 F (36.9 C)  SpO2: 100%  Patient's blood pressure much better controlled on chlorthalidone since starting two weeks prior. She states that she has been taking the medication daily without complications. She states that her headaches have decreased in severity since starting this medicine. Her last BMP revealed adequate renal function and appropriate electrolytes, however we will check another BMP today to assess impact from starting chlorthalidone. -Continue chlorthalidone 25mg  daily -BMP today -RTC in 1 month

## 2019-12-11 NOTE — Patient Instructions (Signed)
Kimberly Orozco,  It was a pleasure meeting you in clinic today.  For your high blood pressure, please continue taking the chlorthalidone once a day. I am glad that you are doing so well on this medication, it seems to be really helping your blood pressure come down. We will check a basic metabolic panel today to see how your kidneys and electrolytes are doing on this medicine. This is the same test that we checked at your last visit two weeks ago with Dr. Sande Brothers.  For your psychiatry care, I will message Lysle Rubens, the provider that gave you a list of psychiatrists in the area to discuss your difficulty with establishing with a clinic in our community.  Otherwise, please call our clinic with any questions or concerns.   Sincerely, Despina Hidden, MD

## 2019-12-11 NOTE — Progress Notes (Signed)
   CC: 2-week hypertension follow-up  HPI:  [name] is a 44 y.o. with history of HTN and schizophrenia who presents to clinic for blood pressure follow-up. Refer to problem list for Assessment/Plan based charting of this encounter.  Past Medical History:  Diagnosis Date  . AKI (acute kidney injury) (HCC)   . Bipolar disorder (HCC)   . Hypertension    not on medications  . Schizophrenia (HCC)    Review of Systems:  Endorses mild headache. Denies chest pain, shortness of breath, palpitations, nausea, vomiting, diarrhea.  Physical Exam:  Vitals:   12/11/19 1453  BP: (!) 131/97  Pulse: 99  Temp: 98.4 F (36.9 C)  TempSrc: Oral  SpO2: 100%  Weight: 213 lb 8 oz (96.8 kg)   Physical Exam Constitutional:      General: She is not in acute distress.    Appearance: Normal appearance.  HENT:     Head: Normocephalic and atraumatic.  Eyes:     Extraocular Movements: Extraocular movements intact.     Conjunctiva/sclera: Conjunctivae normal.  Cardiovascular:     Rate and Rhythm: Normal rate and regular rhythm.     Pulses: Normal pulses.     Heart sounds: Normal heart sounds.  Pulmonary:     Effort: Pulmonary effort is normal.     Breath sounds: Normal breath sounds.  Abdominal:     General: Abdomen is flat. Bowel sounds are normal.     Palpations: Abdomen is soft.     Tenderness: There is no abdominal tenderness.  Musculoskeletal:        General: Normal range of motion.     Cervical back: Normal range of motion and neck supple.  Skin:    General: Skin is warm and dry.     Capillary Refill: Capillary refill takes less than 2 seconds.  Neurological:     General: No focal deficit present.     Mental Status: She is alert. Mental status is at baseline.  Psychiatric:     Comments: Flat affect     Assessment & Plan:   See Encounters Tab for problem based charting.  Patient seen with Dr. Heide Spark

## 2019-12-12 LAB — HEPATITIS C ANTIBODY: Hep C Virus Ab: <0.1 s/co ratio (ref 0.0–0.9)

## 2019-12-12 LAB — BMP8+ANION GAP
Anion Gap: 14 mmol/L (ref 10.0–18.0)
BUN/Creatinine Ratio: 8 — ABNORMAL LOW (ref 9–23)
BUN: 6 mg/dL (ref 6–24)
CO2: 29 mmol/L (ref 20–29)
Calcium: 9.5 mg/dL (ref 8.7–10.2)
Chloride: 97 mmol/L (ref 96–106)
Creatinine, Ser: 0.79 mg/dL (ref 0.57–1.00)
GFR calc Af Amer: 105 mL/min/{1.73_m2} (ref >59)
GFR calc non Af Amer: 91 mL/min/{1.73_m2} (ref >59)
Glucose: 109 mg/dL — ABNORMAL HIGH (ref 65–99)
Potassium: 3.2 mmol/L — ABNORMAL LOW (ref 3.5–5.2)
Sodium: 140 mmol/L (ref 134–144)

## 2019-12-12 NOTE — Progress Notes (Signed)
Internal Medicine Clinic Attending  I saw and evaluated the patient.  I personally confirmed the key portions of the history and exam documented by Dr. Johnson and I reviewed pertinent patient test results.  The assessment, diagnosis, and plan were formulated together and I agree with the documentation in the resident's note.  

## 2019-12-15 ENCOUNTER — Telehealth: Payer: Self-pay

## 2019-12-15 NOTE — Telephone Encounter (Signed)
I am sending a list of Places that the patients daughter can obtain Mental Health from. A letter was mailed out by Lysle Rubens in July with the groups of doctors that would take her insurance. The patient and the mother do not wish to go to [facility]. The mother said she was going to a doctor in [place] but they put her on medication that made her sick.I asked the mother did she call there office back to let them no about the medication, no an I do not trust the doctors in [place], [place] C10/4/20212:44 PM

## 2019-12-30 ENCOUNTER — Other Ambulatory Visit: Payer: Self-pay

## 2019-12-30 ENCOUNTER — Encounter: Payer: Self-pay | Admitting: Surgical

## 2019-12-30 ENCOUNTER — Ambulatory Visit (INDEPENDENT_AMBULATORY_CARE_PROVIDER_SITE_OTHER): Payer: Medicare Other | Admitting: Surgical

## 2019-12-30 VITALS — BP 143/100 | HR 92 | Temp 97.8°F | Wt 210.0 lb

## 2019-12-30 DIAGNOSIS — S71101D Unspecified open wound, right thigh, subsequent encounter: Secondary | ICD-10-CM

## 2019-12-30 DIAGNOSIS — S31109D Unspecified open wound of abdominal wall, unspecified quadrant without penetration into peritoneal cavity, subsequent encounter: Secondary | ICD-10-CM

## 2019-12-30 NOTE — Progress Notes (Signed)
Patient is a 44 year old female here for follow-up after split-thickness skin graft to the right thigh/groin wound with Dr. Arita Miss.  She reports that overall she is doing well.  She is no longer receiving assistance from home health as she did not feel that it was necessary.  The donor site has healed well.  The recipient site has taken very nicely, she does have a small wound on the right side of her abdomen and some redness where the abdominal tissue is in contact with thigh tissue.  It appears irritated from the constant contact.  She also has some drainage noted within the abdominal fold.  No foul odor or purulence noted.  No erythema or cellulitic changes noted.  I had a thorough discussion with the patient in regards to follow-up and continued management. She reports that she would not like to follow-up in person anymore as it is difficult for her to get a ride here.  I think this is reasonable, I do believe a virtual visit to check in would be beneficial. I discussed with her that there is no sign of any infection, everything appears to be well-healed with the exception of the superficial wound on her abdomen.  I discussed with her being conscious of the rash on her abdomen and the moisture that can collect between the abdominal fold.  She is comfortable with all of this and is overall doing really well.  Recommend applying gauze between abdominal fold to help with moisture, recommend Vaseline to abdominal wound.  Pictures were obtained of the patient and placed in the chart with the patient's or guardian's permission.  I recommend she call with any questions or concerns, follow-up via virtual visit scheduled for 2 months.

## 2020-01-02 ENCOUNTER — Ambulatory Visit (INDEPENDENT_AMBULATORY_CARE_PROVIDER_SITE_OTHER): Payer: Medicare Other | Admitting: Internal Medicine

## 2020-01-02 ENCOUNTER — Other Ambulatory Visit: Payer: Self-pay

## 2020-01-02 ENCOUNTER — Encounter: Payer: Self-pay | Admitting: Student

## 2020-01-02 VITALS — BP 139/96 | HR 106 | Temp 101.0°F | Ht 67.0 in | Wt 221.8 lb

## 2020-01-02 DIAGNOSIS — E876 Hypokalemia: Secondary | ICD-10-CM | POA: Diagnosis not present

## 2020-01-02 DIAGNOSIS — E1159 Type 2 diabetes mellitus with other circulatory complications: Secondary | ICD-10-CM

## 2020-01-02 DIAGNOSIS — I10 Essential (primary) hypertension: Secondary | ICD-10-CM | POA: Diagnosis not present

## 2020-01-02 DIAGNOSIS — Z0000 Encounter for general adult medical examination without abnormal findings: Secondary | ICD-10-CM | POA: Diagnosis not present

## 2020-01-02 NOTE — Patient Instructions (Signed)
Thank you, Ms.[name] for allowing [place] to provide your care today. Today we discussed blood pressure.    I have ordered the following labs for you:  Lab Orders  No laboratory test(s) ordered today     Tests ordered today:  none  Referrals ordered today:    Referral Orders     Ambulatory referral to Chronic Care Management Services   I have ordered the following medication/changed the following medications:   Stop the following medications: There are no discontinued medications.   Start the following medications: No orders of the defined types were placed in this encounter.    Follow up: 3 months    Remember:   Should you have any questions or concerns please call the internal medicine clinic at [phone].     Dellia Cloud, D.O. [facility]

## 2020-01-03 ENCOUNTER — Encounter: Payer: Self-pay | Admitting: Internal Medicine

## 2020-01-03 NOTE — Assessment & Plan Note (Signed)
Patient was almost an hour late to her appointment today. She states that transportation has been an ongoing issue for her.  I counseled her on reaching out to Chronic Care management to try to arrange transportation to decrease this barrier to care.   Plan: 1. Chronic Care Management referral.

## 2020-01-03 NOTE — Assessment & Plan Note (Signed)
Patient's blood pressure is 139/96 on Chlorthalidone 25 mg. She admits to being adherent to her medications. She denies any medications side effects. Her previous BMP did show some minimal hypokalemia. I counseled her on eating well balanced meals with enough potassium. I told her that we may need to increase her blood pressure medication. She elected to do this at her next visit.   Plan: - Add additional antihypertensive meds at next visit.  - Repeat BMP at that time.

## 2020-01-03 NOTE — Progress Notes (Signed)
CC: HTN  HPI:  [name] is a 44 y.o. female with a past medical history stated below and presents today for HTN. Please see problem based assessment and plan for additional details.  Past Medical History:  Diagnosis Date  . AKI (acute kidney injury) (HCC)   . Bipolar disorder (HCC)   . Hypertension    not on medications  . Schizophrenia [facility])     Current Outpatient Medications on File Prior to Visit  Medication Sig Dispense Refill  . chlorthalidone (HYGROTON) 25 MG tablet Take 1 tablet (25 mg total) by mouth daily. 90 tablet 0   No current facility-administered medications on file prior to visit.    Family History  Family history unknown: Yes    Social History   Socioeconomic History  . Marital status: Single    Spouse name: Not on file  . Number of children: Not on file  . Years of education: Not on file  . Highest education level: Not on file  Occupational History  . Not on file  Tobacco Use  . Smoking status: Current Every Day Smoker    Types: Cigarettes  . Smokeless tobacco: Never Used  . Tobacco comment: 4-5 per day   Vaping Use  . Vaping Use: Never used  Substance and Sexual Activity  . Alcohol use: Not Currently  . Drug use: Never  . Sexual activity: Not on file  Other Topics Concern  . Not on file  Social History Narrative  . Not on file   Social Determinants of Health   Financial Resource Strain:   . Difficulty of Paying Living Expenses: Not on file  Food Insecurity:   . Worried About Programme researcher, broadcasting/film/video in the Last Year: Not on file  . Ran Out of Food in the Last Year: Not on file  Transportation Needs:   . Lack of Transportation (Medical): Not on file  . Lack of Transportation (Non-Medical): Not on file  Physical Activity:   . Days of Exercise per Week: Not on file  . Minutes of Exercise per Session: Not on file  Stress:   . Feeling of Stress : Not on file  Social Connections:   . Frequency of Communication with Friends and  Family: Not on file  . Frequency of Social Gatherings with Friends and Family: Not on file  . Attends Religious Services: Not on file  . Active Member of Clubs or Organizations: Not on file  . Attends Banker Meetings: Not on file  . Marital Status: Not on file  Intimate Partner Violence:   . Fear of Current or Ex-Partner: Not on file  . Emotionally Abused: Not on file  . Physically Abused: Not on file  . Sexually Abused: Not on file    Review of Systems: ROS negative except for what is noted on the assessment and plan.  Vitals:   01/02/20 1445 01/02/20 1453 01/02/20 1531  BP: 113/89 (!) 130/92 (!) 139/96  Pulse: (!) 107 (!) 106   Temp: 99.1 F (37.3 C)  (!) 101 F (38.3 C)  TempSrc: Oral    SpO2: 100%    Weight: 221 lb 12.8 oz (100.6 kg)    Height: 5\' 7"  (1.702 m)       Physical Exam: Physical Exam Constitutional:      Appearance: Normal appearance.  HENT:     Head: Normocephalic and atraumatic.  Cardiovascular:     Rate and Rhythm: Normal rate.     Pulses: Normal  pulses.     Heart sounds: Normal heart sounds.  Pulmonary:     Effort: Pulmonary effort is normal.     Breath sounds: Normal breath sounds.  Musculoskeletal:        General: Normal range of motion.     Cervical back: Normal range of motion.  Skin:    General: Skin is warm and dry.  Neurological:     General: No focal deficit present.     Mental Status: She is alert. Mental status is at baseline.      Assessment & Plan:   See Encounters Tab for problem based charting.  Patient discussed with Dr. Jaynie Crumble, D.O. [facility] Internal Medicine, PGY-2 Pager: [phone], Phone: [phone] Date 01/03/2020 Time 7:23 AM

## 2020-01-05 ENCOUNTER — Other Ambulatory Visit: Payer: Self-pay

## 2020-01-05 ENCOUNTER — Emergency Department (HOSPITAL_COMMUNITY)
Admission: EM | Admit: 2020-01-05 | Discharge: 2020-01-05 | Disposition: A | Discharge status: Home / Self Care | Payer: Medicare Other | Attending: Emergency Medicine | Admitting: Emergency Medicine

## 2020-01-05 ENCOUNTER — Encounter (HOSPITAL_COMMUNITY): Payer: Self-pay | Admitting: Emergency Medicine

## 2020-01-05 ENCOUNTER — Telehealth: Payer: Self-pay

## 2020-01-05 ENCOUNTER — Encounter (HOSPITAL_COMMUNITY): Payer: Self-pay

## 2020-01-05 DIAGNOSIS — S21209D Unspecified open wound of unspecified back wall of thorax without penetration into thoracic cavity, subsequent encounter: Secondary | ICD-10-CM | POA: Insufficient documentation

## 2020-01-05 DIAGNOSIS — Z4800 Encounter for change or removal of nonsurgical wound dressing: Secondary | ICD-10-CM | POA: Insufficient documentation

## 2020-01-05 DIAGNOSIS — X58XXXD Exposure to other specified factors, subsequent encounter: Secondary | ICD-10-CM | POA: Diagnosis not present

## 2020-01-05 DIAGNOSIS — Z5189 Encounter for other specified aftercare: Secondary | ICD-10-CM

## 2020-01-05 DIAGNOSIS — I10 Essential (primary) hypertension: Secondary | ICD-10-CM | POA: Insufficient documentation

## 2020-01-05 DIAGNOSIS — N939 Abnormal uterine and vaginal bleeding, unspecified: Secondary | ICD-10-CM | POA: Diagnosis not present

## 2020-01-05 DIAGNOSIS — F17210 Nicotine dependence, cigarettes, uncomplicated: Secondary | ICD-10-CM | POA: Insufficient documentation

## 2020-01-05 DIAGNOSIS — L7622 Postprocedural hemorrhage and hematoma of skin and subcutaneous tissue following other procedure: Secondary | ICD-10-CM | POA: Insufficient documentation

## 2020-01-05 DIAGNOSIS — Z5321 Procedure and treatment not carried out due to patient leaving prior to being seen by health care provider: Secondary | ICD-10-CM | POA: Diagnosis not present

## 2020-01-05 NOTE — ED Triage Notes (Addendum)
Pt here from home with c/o a wound in groin area that she had debrided back in august has reopened and started bleeding today.   Ambulatory in triage.   Pt refusing to put gown on at this time.

## 2020-01-05 NOTE — ED Triage Notes (Signed)
Pt here from home with c/o a wound that she had debrided back in august has reopened and started bleeding today , she called the MD who did the procedure and left a message

## 2020-01-05 NOTE — Telephone Encounter (Signed)
Pt requesting a call back [phone]

## 2020-01-05 NOTE — Discharge Instructions (Signed)
You were seen in the emerge department today with bleeding from the wound in your groin area.  Please keep the dressings in place for the next 48 hours and follow your typical wound care instructions.  Please follow with your plastic surgery team and primary care doctors along with wound care team.  Return to the emergency department any new or suddenly worsening symptoms.

## 2020-01-05 NOTE — Telephone Encounter (Signed)
Patient called wanting to pass a message on to Dr. Arita Miss that her wound has opened up and has started to bleed. She stated she is on her way to the ED and wanted Dr. Arita Miss informed of this.

## 2020-01-05 NOTE — Telephone Encounter (Signed)
RTC, patient states she wants to talk to Dr. Marchia Bond.  RN asked patient if RN could assist her with anything or give Dr. Marchia Bond a message.  Patient states no and she only wants to speak with Dr. Marchia Bond. SChaplin, RN,BSN

## 2020-01-05 NOTE — ED Provider Notes (Signed)
Emergency Department Provider Note   I have reviewed the triage vital signs and the nursing notes.   HISTORY  Chief Complaint Wound Check   HPI Kimberly Orozco is a 44 y.o. female with PMH reviewed below presents to the ED with bleeding from a skin graft site from August 2021 performed by Dr. Arita Miss.  She is followed by wound care and states that this morning when on the toilet she had significant bleeding from her right groin area.  The blood was heavy but has decreased significantly since arrival.  She [facility] to touch base with [facility].  She has not had fevers or pain in the area.  No bleeding like this since the surgery.  She has been compliant with her other home medications.  No radiation of symptoms or other modifying factors.  No lightheadedness, shortness of breath, generalized weakness.   Past Medical History:  Diagnosis Date  . AKI (acute kidney injury) (HCC)   . Bipolar disorder (HCC)   . Hypertension    not on medications  . Schizophrenia [facility])     Patient Active Problem List   Diagnosis Date Noted  . Healthcare maintenance 12/11/2019  . Hypertension 11/27/2019  . Open abdominal wall wound 08/04/2019  . Open thigh wound, right, subsequent encounter 08/04/2019  . Necrotizing cellulitis   . Bipolar disorder (HCC) 04/26/2019  . Schizophrenia (HCC) 04/25/2019    Past Surgical History:  Procedure Laterality Date  . APPLICATION OF A-CELL OF EXTREMITY N/A 07/08/2019   Procedure: with placement of primatrix AG;  Surgeon: Allena Napoleon, MD;  Location: [facility] OR;  Service: Plastics;  Laterality: N/A;  . APPLICATION OF A-CELL OF EXTREMITY Right 07/22/2019   Procedure: placement of primatrix mesh;  Surgeon: Allena Napoleon, MD;  Location: [facility] OR;  Service: Plastics;  Laterality: Right;  . INCISION AND DRAINAGE OF WOUND N/A 06/26/2019   Procedure: IRRIGATION AND DEBRIDEMENT ABDOMINAL WALL AND GROIN;  Surgeon: Axel Filler, MD;  Location: [facility] OR;  Service: General;  Laterality: N/A;  . INCISION AND DRAINAGE OF WOUND N/A 07/08/2019   Procedure: Debridement of abdominal and groin wound;  Surgeon: Allena Napoleon, MD;  Location: [facility] OR;  Service: Plastics;  Laterality: N/A;  . INCISION AND DRAINAGE PERIRECTAL ABSCESS Right 06/24/2019   Procedure: IRRIGATION AND DEBRIDEMENT RIGHT GROIN AND RIGHT BUTTOCK;  Surgeon: Axel Filler, MD;  Location: [facility] OR;  Service: General;  Laterality: Right;  . IRRIGATION AND DEBRIDEMENT OF WOUND WITH SPLIT THICKNESS SKIN GRAFT Right 07/22/2019   Procedure: Debridement of right thigh wound;  Surgeon: Allena Napoleon, MD;  Location: [facility] OR;  Service: Plastics;  Laterality: Right;  . IRRIGATION AND DEBRIDEMENT OF WOUND WITH SPLIT THICKNESS SKIN GRAFT Right 10/28/2019   Procedure: Debridement of right thigh wound with placement of split thickness skin graft;  Surgeon: Allena Napoleon, MD;  Location: [facility] OR;  Service: Plastics;  Laterality: Right;    Allergies Patient has no known allergies.  Family History  Family history unknown: Yes    Social History Social History   Tobacco Use  . Smoking status: Current Every Day Smoker    Types: Cigarettes  . Smokeless tobacco: Never Used  . Tobacco comment: 4-5 per day   Vaping Use  . Vaping Use: Never used  Substance Use Topics  . Alcohol use: Not Currently  . Drug use: Never    Review of Systems  Constitutional: No fever/chills  Cardiovascular: Denies chest pain. Respiratory: Denies shortness of breath. Gastrointestinal: No abdominal pain.  Musculoskeletal: Positive for chronic back pain. Skin: Bleeding from inguinal wound.  Neurological: Negative for headaches.  10-point ROS otherwise negative.  ____________________________________________   PHYSICAL EXAM:  VITAL SIGNS: ED Triage Vitals  Enc Vitals Group     BP 01/05/20 1430 (!) 154/116     Pulse Rate 01/05/20 1430 (!) 106     Resp 01/05/20 1430 16     Temp  01/05/20 1430 99.4 F (37.4 C)     Temp Source 01/05/20 1430 Oral     SpO2 01/05/20 1430 94 %   Constitutional: Alert and oriented. Well appearing and in no acute distress. Eyes: Conjunctivae are normal.  Head: Atraumatic. Nose: No congestion/rhinnorhea. Mouth/Throat: Mucous membranes are moist.  Neck: No stridor.   Cardiovascular: Normal rate, regular rhythm. Good peripheral circulation. Grossly normal heart sounds.   Respiratory: Normal respiratory effort.  No retractions. Lungs CTAB. Gastrointestinal:  No distention.  Musculoskeletal: No gross deformities of extremities. Neurologic:  Normal speech and language.  Skin: No active bleeding from the right inguinal wound.  The area is not hot to touch.  No surrounding cellulitis or induration.  No area of fluctuance to suspect abscess.  No wound dehiscence.   ____________________________________________   PROCEDURES  Procedure(s) performed:   Procedures  None  ____________________________________________   INITIAL IMPRESSION / ASSESSMENT AND PLAN / ED COURSE  Pertinent labs & imaging results that were available during my care of the patient were reviewed by me and considered in my medical decision making (see chart for details).   Patient presents to the emergency department with bleeding from her right inguinal wound at home.  Bleeding has stopped.  There is no area of dehiscence.  The area does not appear infected.  I placed gauze in the area of the wound and discussed the case with Dr. Arita Miss by phone.  Patient to follow with her wound team and them as scheduled.   ____________________________________________  FINAL CLINICAL IMPRESSION(S) / ED DIAGNOSES  Final diagnoses:  Visit for wound check    Note:  This document was prepared using Dragon voice recognition software and may include unintentional dictation errors.  Alona Bene, MD, [facility] Emergency Medicine    Tyshana Nishida, Arlyss Repress, MD 01/05/20 [phone]

## 2020-01-05 NOTE — Progress Notes (Signed)
Internal Medicine Clinic Attending  Case discussed with Dr. Coe  At the time of the visit.  We reviewed the resident's history and exam and pertinent patient test results.  I agree with the assessment, diagnosis, and plan of care documented in the resident's note.  

## 2020-01-06 NOTE — Telephone Encounter (Signed)
Dr. Arita Miss was informed and spoke with the ED doctor.//AB/CMA

## 2020-01-09 ENCOUNTER — Emergency Department (HOSPITAL_COMMUNITY)
Admission: EM | Admit: 2020-01-09 | Discharge: 2020-01-13 | Disposition: A | Discharge status: Home / Self Care | Payer: Medicare Other | Attending: Emergency Medicine | Admitting: Emergency Medicine

## 2020-01-09 DIAGNOSIS — F259 Schizoaffective disorder, unspecified: Secondary | ICD-10-CM | POA: Insufficient documentation

## 2020-01-09 DIAGNOSIS — F200 Paranoid schizophrenia: Secondary | ICD-10-CM | POA: Diagnosis not present

## 2020-01-09 DIAGNOSIS — Z20822 Contact with and (suspected) exposure to covid-19: Secondary | ICD-10-CM | POA: Diagnosis not present

## 2020-01-09 DIAGNOSIS — Z046 Encounter for general psychiatric examination, requested by authority: Secondary | ICD-10-CM | POA: Diagnosis present

## 2020-01-09 DIAGNOSIS — F17210 Nicotine dependence, cigarettes, uncomplicated: Secondary | ICD-10-CM | POA: Insufficient documentation

## 2020-01-09 DIAGNOSIS — Y906 Blood alcohol level of 120-199 mg/100 ml: Secondary | ICD-10-CM | POA: Diagnosis not present

## 2020-01-09 DIAGNOSIS — I10 Essential (primary) hypertension: Secondary | ICD-10-CM | POA: Diagnosis not present

## 2020-01-09 DIAGNOSIS — Z9114 Patient's other noncompliance with medication regimen: Secondary | ICD-10-CM | POA: Insufficient documentation

## 2020-01-09 DIAGNOSIS — Z79899 Other long term (current) drug therapy: Secondary | ICD-10-CM | POA: Insufficient documentation

## 2020-01-09 DIAGNOSIS — F209 Schizophrenia, unspecified: Secondary | ICD-10-CM | POA: Diagnosis present

## 2020-01-09 LAB — PREGNANCY, URINE: Preg Test, Ur: NEGATIVE

## 2020-01-09 LAB — CBC WITH DIFFERENTIAL/PLATELET
Abs Immature Granulocytes: 0.11 10*3/uL — ABNORMAL HIGH (ref 0.00–0.07)
Basophils Absolute: 0.1 10*3/uL (ref 0.0–0.1)
Basophils Relative: 0 %
Eosinophils Absolute: 0.1 10*3/uL (ref 0.0–0.5)
Eosinophils Relative: 1 %
HCT: 40.1 % (ref 36.0–46.0)
Hemoglobin: 13 g/dL (ref 12.0–15.0)
Immature Granulocytes: 1 %
Lymphocytes Relative: 18 %
Lymphs Abs: 2.7 10*3/uL (ref 0.7–4.0)
MCH: 27.5 pg (ref 26.0–34.0)
MCHC: 32.4 g/dL (ref 30.0–36.0)
MCV: 84.8 fL (ref 80.0–100.0)
Monocytes Absolute: 0.8 10*3/uL (ref 0.1–1.0)
Monocytes Relative: 5 %
Neutro Abs: 11.4 10*3/uL — ABNORMAL HIGH (ref 1.7–7.7)
Neutrophils Relative %: 75 %
Platelets: 320 10*3/uL (ref 150–400)
RBC: 4.73 MIL/uL (ref 3.87–5.11)
RDW: 16.4 % — ABNORMAL HIGH (ref 11.5–15.5)
WBC: 15.2 10*3/uL — ABNORMAL HIGH (ref 4.0–10.5)
nRBC: 0 % (ref 0.0–0.2)

## 2020-01-09 LAB — RESPIRATORY PANEL BY RT PCR (FLU A&B, COVID)
Influenza A by PCR: NEGATIVE
Influenza B by PCR: NEGATIVE
SARS Coronavirus 2 by RT PCR: NEGATIVE

## 2020-01-09 LAB — COMPREHENSIVE METABOLIC PANEL
ALT: 13 U/L (ref 0–44)
AST: 14 U/L — ABNORMAL LOW (ref 15–41)
Albumin: 3.4 g/dL — ABNORMAL LOW (ref 3.5–5.0)
Alkaline Phosphatase: 65 U/L (ref 38–126)
Anion gap: 13 (ref 5–15)
BUN: 23 mg/dL — ABNORMAL HIGH (ref 6–20)
CO2: 28 mmol/L (ref 22–32)
Calcium: 8.2 mg/dL — ABNORMAL LOW (ref 8.9–10.3)
Chloride: 95 mmol/L — ABNORMAL LOW (ref 98–111)
Creatinine, Ser: 1.22 mg/dL — ABNORMAL HIGH (ref 0.44–1.00)
GFR, Estimated: 56 mL/min — ABNORMAL LOW (ref >60)
Glucose, Bld: 118 mg/dL — ABNORMAL HIGH (ref 70–99)
Potassium: 3.2 mmol/L — ABNORMAL LOW (ref 3.5–5.1)
Sodium: 136 mmol/L (ref 135–145)
Total Bilirubin: 0.4 mg/dL (ref 0.3–1.2)
Total Protein: 7.9 g/dL (ref 6.5–8.1)

## 2020-01-09 LAB — RAPID URINE DRUG SCREEN, HOSP PERFORMED
Amphetamines: NOT DETECTED
Barbiturates: NOT DETECTED
Benzodiazepines: NOT DETECTED
Cocaine: NOT DETECTED
Opiates: NOT DETECTED
Tetrahydrocannabinol: NOT DETECTED

## 2020-01-09 LAB — SALICYLATE LEVEL: Salicylate Lvl: <7 mg/dL — ABNORMAL LOW (ref 7.0–30.0)

## 2020-01-09 LAB — ETHANOL: Alcohol, Ethyl (B): <10 mg/dL (ref <10)

## 2020-01-09 LAB — ACETAMINOPHEN LEVEL: Acetaminophen (Tylenol), Serum: <10 ug/mL — ABNORMAL LOW (ref 10–30)

## 2020-01-09 MED ORDER — CHLORTHALIDONE 25 MG PO TABS
25.0000 mg | ORAL_TABLET | Freq: Every day | ORAL | Status: DC
  Administered 2020-01-10 – 2020-01-13 (×4): 25 mg via ORAL
  Filled 2020-01-09 (×5): qty 1

## 2020-01-09 MED ORDER — RISPERIDONE 1 MG PO TBDP: 2.0000 mg | ORAL_TABLET | Freq: Three times a day (TID) | ORAL | Status: DC | PRN

## 2020-01-09 MED ORDER — OXYCODONE-ACETAMINOPHEN 5-325 MG PO TABS
1.0000 | ORAL_TABLET | Freq: Once | ORAL | Status: AC
  Administered 2020-01-09: 1 via ORAL
  Filled 2020-01-09: qty 1

## 2020-01-09 MED ORDER — NICOTINE 21 MG/24HR TD PT24
21.0000 mg | MEDICATED_PATCH | Freq: Every day | TRANSDERMAL | Status: DC
  Filled 2020-01-09: qty 1

## 2020-01-09 MED ORDER — ACETAMINOPHEN 325 MG PO TABS
650.0000 mg | ORAL_TABLET | ORAL | Status: DC | PRN
  Administered 2020-01-09 – 2020-01-13 (×12): 650 mg via ORAL
  Filled 2020-01-09 (×11): qty 2

## 2020-01-09 MED ORDER — ZIPRASIDONE MESYLATE 20 MG IM SOLR
20.0000 mg | INTRAMUSCULAR | Status: AC | PRN
  Administered 2020-01-12: 20 mg via INTRAMUSCULAR
  Filled 2020-01-09: qty 20

## 2020-01-09 MED ORDER — ZOLPIDEM TARTRATE 5 MG PO TABS
5.0000 mg | ORAL_TABLET | Freq: Every evening | ORAL | Status: DC | PRN
  Administered 2020-01-09 – 2020-01-12 (×3): 5 mg via ORAL
  Filled 2020-01-09 (×3): qty 1

## 2020-01-09 MED ORDER — LORAZEPAM 1 MG PO TABS
1.0000 mg | ORAL_TABLET | ORAL | Status: AC | PRN
  Administered 2020-01-13: 1 mg via ORAL
  Filled 2020-01-09 (×2): qty 1

## 2020-01-09 MED ORDER — POTASSIUM CHLORIDE CRYS ER 20 MEQ PO TBCR
20.0000 meq | EXTENDED_RELEASE_TABLET | Freq: Every day | ORAL | Status: DC
  Administered 2020-01-10 – 2020-01-13 (×4): 20 meq via ORAL
  Filled 2020-01-09 (×5): qty 1

## 2020-01-09 MED ORDER — ALUM & MAG HYDROXIDE-SIMETH 200-200-20 MG/5ML PO SUSP: 30.0000 mL | Freq: Four times a day (QID) | ORAL | Status: DC | PRN

## 2020-01-09 NOTE — BHH Counselor (Addendum)
Disposition: Berneice Heinrich, FNP recommends in patient treatment once medically clear. Per [place], RN/AC, [facility] is currently at capacity. CSW will seek placement. [facility] ED notified of disposition.
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
## 2020-01-09 NOTE — ED Notes (Signed)
Patient continues to walk up to nurses station with multiple complaints and keep asking when is she going home. Patient refused tylenol and Ativan as this time. RN explained that she is now waiting for a bed to become available at [facility].

## 2020-01-09 NOTE — ED Triage Notes (Signed)
Pt brought to [facility] . Pt IVCd.
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
## 2020-01-09 NOTE — ED Notes (Signed)
PT IS VERY RUDE AND DISRESPECTFUL TO TCU STAFF, SHE KEEPS COMING IN AND OUT OF ROOM AND WHEN ASKED TO GO BACK INTO ROOM, SHE YELLS AND CUSSES AT STAFF.

## 2020-01-10 DIAGNOSIS — F259 Schizoaffective disorder, unspecified: Secondary | ICD-10-CM | POA: Diagnosis not present

## 2020-01-10 DIAGNOSIS — F200 Paranoid schizophrenia: Secondary | ICD-10-CM | POA: Diagnosis not present

## 2020-01-10 MED ORDER — DOCUSATE SODIUM 100 MG PO CAPS
100.0000 mg | ORAL_CAPSULE | Freq: Two times a day (BID) | ORAL | Status: DC
  Administered 2020-01-11 – 2020-01-13 (×4): 100 mg via ORAL
  Filled 2020-01-10 (×7): qty 1

## 2020-01-10 MED ORDER — RISPERIDONE 1 MG PO TABS
1.0000 mg | ORAL_TABLET | Freq: Every day | ORAL | Status: DC
  Filled 2020-01-10 (×3): qty 1

## 2020-01-10 MED ORDER — BENZTROPINE MESYLATE 0.5 MG PO TABS
0.5000 mg | ORAL_TABLET | Freq: Two times a day (BID) | ORAL | Status: DC
  Filled 2020-01-10 (×5): qty 1

## 2020-01-10 MED ORDER — RISPERIDONE 2 MG PO TABS
2.0000 mg | ORAL_TABLET | Freq: Every day | ORAL | Status: DC
  Filled 2020-01-10 (×3): qty 1

## 2020-01-10 NOTE — Consult Note (Addendum)
[facility] Face-to-Face Psychiatry Consult   Reason for Consult: Bizarre behavior Referring Physician: EDP Patient Identification: Kimberly Orozco MRN:  [id number] Principal Diagnosis: Schizophrenia (HCC) Diagnosis:  Principal Problem:   Schizophrenia (HCC)   Total Time spent with patient: 15 minutes  Subjective:   Kimberly Orozco is a 44 y.o. female was seen and evaluated face-to-face by nurse practitioner and TTS counselor.  She is denying suicidal or homicidal ideations.  Denies auditory or visual hallucinations.  She reports she is no longer has schizophrenia since she left [place].  States she does not need medications.  Kimberly Orozco reports she was prescribed Haldol however she is no longer taking this medication due to side effects. Per IVC patient has not been compliant with medications with bizarre behavior and hallucinations.  As reported by IVC patient is aggressive, hostile and paranoid.  Per nursing staff patient needs constant redirection and is labile. NP attempted to follow-up with patient's mother for additional collateral.  No answer at [phone]. Attempted to follow-up and was reported that her mother is "sleeping" and unable to come to the phone. Case staffed with attending psychiatrist Juneau Doughman.   We will continue to recommend inpatient admission.  Support, encouragement and reassurance was provided.  HPI:  Kimberly Orozco is an 44 y.o. female presenting to [facility] ED under IVC. IVC, petitioned by her mother, states that she is prescribed haloperidol but refused to take it as prescribed. She is having hallucinations, acting inappropriately, removing her clothing, and is now a danger to herself and others. During this counselor's assessment patient is cooperative, however presents paranoid and thoughts are tangential. Patient states last night she was crying all night due to severe back and neck pain. She states for this reason her mother and father took out IVC. Patient reports only sleeping 3  hours per night due to severe pain. Patient repeatedly asks to see a judge as her father's license is suspended.  Patient makes multiple statements during assessment that there is incest between her cousins and that is the reason they want her committed. Patient also states her mother agitates her so much that "my teeth are falling out and my hair got so tangled that I had to cut it and now it won't grow anymore." Patient denies SI/HI/AVH. She reports that she was diagnosed with schizophrenia while living in [place]. She states she does not have any outpatient care or take any medications. She reports a main source of stress is her mother. She states that she has signed up with [employer] to assist in finding a new place to live. She denies any substance use or criminal charges.  Past Psychiatric History:   Risk to Self: Suicidal Ideation: No Suicidal Intent: No Is patient at risk for suicide?: No Suicidal Plan?: No Access to Means: No What has been your use of drugs/alcohol within the last 12 months?: denies How many times?: 0 Other Self Harm Risks: denies Triggers for Past Attempts: None known Intentional Self Injurious Behavior: None Risk to Others: Homicidal Ideation: No Thoughts of Harm to Others: No Current Homicidal Intent: No Current Homicidal Plan: No Access to Homicidal Means: No Identified Victim: denies History of harm to others?: No Assessment of Violence: None Noted Violent Behavior Description: denies Does patient have access to weapons?: No Criminal Charges Pending?: No Does patient have a court date: No Prior Inpatient Therapy: Prior Inpatient Therapy: Yes Prior Therapy Dates: 2021 and prior in [place] Prior Therapy Facilty/Provider(s): [facility], hospital in [place] Reason for Treatment:  schizophrenia Prior Outpatient Therapy: Prior Outpatient Therapy: Yes Prior Therapy Dates: 2021 Prior Therapy Facilty/Provider(s): [facility] Reason for Treatment: med management Does  patient have an ACCT team?: No Does patient have Intensive In-House Services?  : No Does patient have [facility] services? : No Does patient have P4CC services?: No  Past Medical History:  Past Medical History:  Diagnosis Date  . AKI (acute kidney injury) (HCC)   . Bipolar disorder (HCC)   . Hypertension    not on medications  . Schizophrenia [facility](HCC)     Past Surgical History:  Procedure Laterality Date  . APPLICATION OF A-CELL OF EXTREMITY N/A 07/08/2019   Procedure: with placement of primatrix AG;  Surgeon: Allena NapoleonPace, Collier S, MD;  Location: [facility] OR;  Service: Plastics;  Laterality: N/A;  . APPLICATION OF A-CELL OF EXTREMITY Right 07/22/2019   Procedure: placement of primatrix mesh;  Surgeon: Allena NapoleonPace, Collier S, MD;  Location: [facility] OR;  Service: Plastics;  Laterality: Right;  . INCISION AND DRAINAGE OF WOUND N/A 06/26/2019   Procedure: IRRIGATION AND DEBRIDEMENT ABDOMINAL WALL AND GROIN;  Surgeon: Axel Filleramirez, Armando, MD;  [facility];  Service: General;  Laterality: N/A;  . INCISION AND DRAINAGE OF WOUND N/A 07/08/2019   Procedure: Debridement of abdominal and groin wound;  Surgeon: Allena NapoleonPace, Collier S, MD;  Location: [facility] OR;  Service: Plastics;  Laterality: N/A;  . INCISION AND DRAINAGE PERIRECTAL ABSCESS Right 06/24/2019   Procedure: IRRIGATION AND DEBRIDEMENT RIGHT GROIN AND RIGHT BUTTOCK;  Surgeon: Axel Filleramirez, Armando, MD;  [facility];  Service: General;  Laterality: Right;  . IRRIGATION AND DEBRIDEMENT OF WOUND WITH SPLIT THICKNESS SKIN GRAFT Right 07/22/2019   Procedure: Debridement of right thigh wound;  Surgeon: Allena NapoleonPace, Collier S, MD;  [facility];  Service: Plastics;  Laterality: Right;  . IRRIGATION AND DEBRIDEMENT OF WOUND WITH SPLIT THICKNESS SKIN GRAFT Right 10/28/2019   Procedure: Debridement of right thigh wound with placement of split thickness skin graft;  Surgeon: Allena NapoleonPace, Collier S, MD;  Location: [facility] OR;  Service: Plastics;  Laterality: Right;   Family History:  Family History  Family  history unknown: Yes   Family Psychiatric  History:  Social History:  Social History   Substance and Sexual Activity  Alcohol Use Not Currently     Social History   Substance and Sexual Activity  Drug Use Never    Social History   Socioeconomic History  . Marital status: Single    Spouse name: Not on file  . Number of children: Not on file  . Years of education: Not on file  . Highest education level: Not on file  Occupational History  . Not on file  Tobacco Use  . Smoking status: Current Every Day Smoker    Types: Cigarettes  . Smokeless tobacco: Never Used  . Tobacco comment: 4-5 per day   Vaping Use  . Vaping Use: Never used  Substance and Sexual Activity  . Alcohol use: Not Currently  . Drug use: Never  . Sexual activity: Not on file  Other Topics Concern  . Not on file  Social History Narrative  . Not on file   Social Determinants of Health   Financial Resource Strain:   . Difficulty of Paying Living Expenses: Not on file  Food Insecurity:   . Worried About Programme researcher, broadcasting/film/videounning Out of Food in the Last Year: Not on file  . Ran Out of Food in the Last Year: Not on file  Transportation Needs:   . Lack of Transportation (  Medical): Not on file  . Lack of Transportation (Non-Medical): Not on file  Physical Activity:   . Days of Exercise per Week: Not on file  . Minutes of Exercise per Session: Not on file  Stress:   . Feeling of Stress : Not on file  Social Connections:   . Frequency of Communication with Friends and Family: Not on file  . Frequency of Social Gatherings with Friends and Family: Not on file  . Attends Religious Services: Not on file  . Active Member of Clubs or Organizations: Not on file  . Attends Banker Meetings: Not on file  . Marital Status: Not on file   Additional Social History:    Allergies:  No Known Allergies  Labs:  Results for orders placed or performed during the hospital encounter of 01/09/20 (from the past 48 hour(s))   Comprehensive metabolic panel     Status: Abnormal   Collection Time: 01/09/20  4:23 PM  Result Value Ref Range   Sodium 136 135 - 145 mmol/L   Potassium 3.2 (L) 3.5 - 5.1 mmol/L   Chloride 95 (L) 98 - 111 mmol/L   CO2 28 22 - 32 mmol/L   Glucose, Bld 118 (H) 70 - 99 mg/dL    Comment: Glucose reference range applies only to samples taken after fasting for at least 8 hours.   BUN 23 (H) 6 - 20 mg/dL   Creatinine, Ser 1.19 (H) 0.44 - 1.00 mg/dL   Calcium 8.2 (L) 8.9 - 10.3 mg/dL   Total Protein 7.9 6.5 - 8.1 g/dL   Albumin 3.4 (L) 3.5 - 5.0 g/dL   AST 14 (L) 15 - 41 U/L   ALT 13 0 - 44 U/L   Alkaline Phosphatase 65 38 - 126 U/L   Total Bilirubin 0.4 0.3 - 1.2 mg/dL   GFR, Estimated 56 (L) >60 mL/min    Comment: (NOTE) Calculated using the CKD-EPI Creatinine Equation (2021)    Anion gap 13 5 - 15    Comment: Performed at [facility], [address] [place], [place] [address]  Acetaminophen level     Status: Abnormal   Collection Time: 01/09/20  4:23 PM  Result Value Ref Range   Acetaminophen (Tylenol), Serum <10 (L) 10 - 30 ug/mL    Comment: (NOTE) Therapeutic concentrations vary significantly. A range of 10-30 ug/mL  may be an effective concentration for many patients. However, some  are best treated at concentrations outside of this range. Acetaminophen concentrations >150 ug/mL at 4 hours after ingestion  and >50 ug/mL at 12 hours after ingestion are often associated with  toxic reactions.  Performed at [facility] [address], [place], [place] [address]   Ethanol     Status: None   Collection Time: 01/09/20  4:23 PM  Result Value Ref Range   Alcohol, Ethyl (B) <10 <10 mg/dL    Comment: (NOTE) Lowest detectable limit for serum alcohol is 10 mg/dL.  For medical purposes only. Performed at [facility], [address] [place], [place] [address]   Salicylate level     Status: Abnormal   Collection Time:  01/09/20  4:23 PM  Result Value Ref Range   Salicylate Lvl <7.0 (L) 7.0 - 30.0 mg/dL    Comment: Performed at [facility], [address] [place], [place] [address]  CBC with Differential     Status: Abnormal   Collection Time: 01/09/20  4:23 PM  Result Value Ref Range   WBC 15.2 (H) 4.0 - 10.5 K/uL   RBC 4.73 3.87 - 5.11 MIL/uL   Hemoglobin 13.0 12.0 - 15.0 g/dL   HCT 99.2 36 - 46 %   MCV 84.8 80.0 - 100.0 fL   MCH 27.5 26.0 - 34.0 pg   MCHC 32.4 30.0 - 36.0 g/dL   RDW 42.6 (H) 83.4 - 19.6 %   Platelets 320 150 - 400 K/uL   nRBC 0.0 0.0 - 0.2 %   Neutrophils Relative % 75 %   Neutro Abs 11.4 (H) 1.7 - 7.7 K/uL   Lymphocytes Relative 18 %   Lymphs Abs 2.7 0.7 - 4.0 K/uL   Monocytes Relative 5 %   Monocytes Absolute 0.8 0.1 - 1.0 K/uL   Eosinophils Relative 1 %   Eosinophils Absolute 0.1 0.0 - 0.5 K/uL   Basophils Relative 0 %   Basophils Absolute 0.1 0.0 - 0.1 K/uL   Immature Granulocytes 1 %   Abs Immature Granulocytes 0.11 (H) 0.00 - 0.07 K/uL    Comment: Performed at [facility], [address], [place], [place] [address]  Respiratory Panel by RT PCR (Flu A&B, Covid) - Nasopharyngeal Swab     Status: None   Collection Time: 01/09/20  4:39 PM   Specimen: Nasopharyngeal Swab  Result Value Ref Range   SARS Coronavirus 2 by RT PCR NEGATIVE NEGATIVE    Comment: (NOTE) SARS-CoV-2 target nucleic acids are NOT DETECTED.  The SARS-CoV-2 RNA is generally detectable in upper respiratoy specimens during the acute phase of infection. The lowest concentration of SARS-CoV-2 viral copies this assay can detect is 131 copies/mL. A negative result does not preclude SARS-Cov-2 infection and should not be used as the sole basis for treatment or other patient management decisions. A negative result may occur with  improper specimen collection/handling, submission of specimen other than nasopharyngeal swab, presence of viral mutation(s) within  the areas targeted by this assay, and inadequate number of viral copies (<131 copies/mL). A negative result must be combined with clinical observations, patient history, and epidemiological information. The expected result is Negative.  Fact Sheet for Patients:  https://www.moore.com/  Fact Sheet for Healthcare Providers:  https://www.young.biz/  This test is no t yet approved or cleared by the [place] FDA and  has been authorized for detection and/or diagnosis of SARS-CoV-2 by FDA under an Emergency Use Authorization (EUA). This EUA will remain  in effect (meaning this test can be used) for the duration of the COVID-19 declaration under Section 564(b)(1) of the Act, 21 U.S.C. section 360bbb-3(b)(1), unless the authorization is terminated or revoked sooner.     Influenza A by PCR NEGATIVE NEGATIVE   Influenza B by PCR NEGATIVE NEGATIVE    Comment: (NOTE) The Xpert Xpress SARS-CoV-2/FLU/RSV assay is intended as an aid in  the diagnosis of influenza from Nasopharyngeal swab specimens and  should not be used as a sole basis for treatment. Nasal washings and  aspirates are unacceptable for Xpert Xpress SARS-CoV-2/FLU/RSV  testing.  Fact Sheet for Patients: https://www.moore.com/  Fact Sheet for Healthcare Providers: https://www.young.biz/  This test is not yet approved or cleared by the [place] FDA and  has been authorized for detection and/or diagnosis of SARS-CoV-2 by  FDA under an Emergency Use Authorization (EUA). This EUA will remain  in effect (meaning this test can be used) for the duration of the  Covid-19 declaration under Section 564(b)(1) of the Act, 21  U.S.C. section 360bbb-3(b)(1),  unless the authorization is  terminated or revoked. Performed at [facility], [address] [place], [place] [address]   Urine rapid drug screen (hosp performed)     Status:  None   Collection Time: 01/09/20  8:20 PM  Result Value Ref Range   Opiates NONE DETECTED NONE DETECTED   Cocaine NONE DETECTED NONE DETECTED   Benzodiazepines NONE DETECTED NONE DETECTED   Amphetamines NONE DETECTED NONE DETECTED   Tetrahydrocannabinol NONE DETECTED NONE DETECTED   Barbiturates NONE DETECTED NONE DETECTED    Comment: (NOTE) DRUG SCREEN FOR MEDICAL PURPOSES ONLY.  IF CONFIRMATION IS NEEDED FOR ANY PURPOSE, NOTIFY LAB WITHIN 5 DAYS.  LOWEST DETECTABLE LIMITS FOR URINE DRUG SCREEN Drug Class                     Cutoff (ng/mL) Amphetamine and metabolites    1000 Barbiturate and metabolites    200 Benzodiazepine                 200 Tricyclics and metabolites     300 Opiates and metabolites        300 Cocaine and metabolites        300 THC                            50 Performed at [facility], [address], [place], [place] [address]   Pregnancy, urine     Status: None   Collection Time: 01/09/20  8:20 PM  Result Value Ref Range   Preg Test, Ur NEGATIVE NEGATIVE    Comment:        THE SENSITIVITY OF THIS METHODOLOGY IS >20 mIU/mL. Performed at [facility], [address] [place], [place] [address]     Current Facility-Administered Medications  Medication Dose Route Frequency Provider Last Rate Last Admin  . acetaminophen (TYLENOL) tablet 650 mg  650 mg Oral Q4H PRN Fayrene Helper, PA-C   650 mg at 01/10/20 0549  . alum & mag hydroxide-simeth (MAALOX/MYLANTA) 200-200-20 MG/5ML suspension 30 mL  30 mL Oral Q6H PRN Fayrene Helper, PA-C      . benztropine (COGENTIN) tablet 0.5 mg  0.5 mg Oral BID Oneta Rack, NP      . chlorthalidone (HYGROTON) tablet 25 mg  25 mg Oral Daily Fayrene Helper, PA-C   25 mg at 01/10/20 0900  . docusate sodium (COLACE) capsule 100 mg  100 mg Oral BID Mancel Bale, MD      . LORazepam (ATIVAN) tablet 1 mg  1 mg Oral PRN Fayrene Helper, PA-C       And  . ziprasidone (GEODON) injection 20 mg  20 mg  Intramuscular PRN Fayrene Helper, PA-C      . nicotine (NICODERM CQ - dosed in mg/24 hours) patch 21 mg  21 mg Transdermal Daily Fayrene Helper, PA-C      . potassium chloride SA (KLOR-CON) CR tablet 20 mEq  20 mEq Oral Daily Terald Sleeper, MD   20 mEq at 01/10/20 0900  . risperiDONE (RISPERDAL) tablet 1 mg  1 mg Oral Daily Lewis, Tanika N, NP      . risperiDONE (RISPERDAL) tablet 2 mg  2 mg Oral QHS Oneta Rack, NP      . zolpidem (AMBIEN) tablet 5 mg  5 mg Oral QHS PRN Fayrene Helper, PA-C   5 mg at 01/09/20 2011   Current Outpatient  Medications  Medication Sig Dispense Refill  . chlorthalidone (HYGROTON) 25 MG tablet Take 1 tablet (25 mg total) by mouth daily. 90 tablet 0    Musculoskeletal: Strength & Muscle Tone: within normal limits Gait & Station: normal Patient leans: N/A  Psychiatric Specialty Exam: Physical Exam Vitals reviewed.  Neurological:     Mental Status: She is alert.  Psychiatric:        Mood and Affect: Mood normal.        Behavior: Behavior normal.     Review of Systems  Psychiatric/Behavioral: Positive for agitation. The patient is nervous/anxious.   All other systems reviewed and are negative.   Blood pressure (!) 127/92, pulse 86, temperature 98.3 F (36.8 C), temperature source Oral, resp. rate 16, last menstrual period 12/30/2019, SpO2 100 %.There is no height or weight on file to calculate BMI.  General Appearance: Casual  Eye Contact:  Good  Speech:  Clear and Coherent  Volume:  Normal  Mood:  Anxious and Depressed  Affect:  Congruent  Thought Process:  Coherent  Orientation:  Full (Time, Place, and Person)  Thought Content:  Rumination and Tangential  Suicidal Thoughts:  No  Homicidal Thoughts:  No  Memory:  Immediate;   Fair Recent;   Fair  Judgement:  Fair  Insight:  Fair  Psychomotor Activity:  Normal  Concentration:  Concentration: Fair  Recall:  [employer] of Knowledge:  Fair  Language:  Fair  Akathisia:  No  Handed:  Right  AIMS  (if indicated):     Assets:  Communication Skills Desire for Improvement Social Support Talents/Skills  ADL's:  Intact  Cognition:  WNL  Sleep:      Inpatient admission recommended  Treatment Plan Summary: Daily contact with patient to assess and evaluate symptoms and progress in treatment and Medication management   EKG- for medication/baseline -Risperdal 1 mg p.o. daily and 2 mg p.o. nightly -Cogentin 0.5 mg p.o. twice daily -See chart for agitation protocol  Disposition: Recommend psychiatric Inpatient admission when medically cleared.  Oneta Rack, NP 01/10/2020 12:38 PM  Patient seen via tele health for psychiatric evaluation, chart reviewed and case discussed with the physician extender and developed treatment plan. Reviewed the information documented and agree with the treatment plan. Thedore Mins, MD

## 2020-01-10 NOTE — ED Provider Notes (Signed)
Emergency Medicine Observation Re-evaluation Note  Kimberly Orozco is a 44 y.o. female, seen on rounds today.  Pt initially presented to the ED for complaints of Medical Clearance (IVCd) Currently, the patient is calm.  Physical Exam  BP 140/89 (BP Location: Left Arm)   Pulse 90   Temp 98.3 F (36.8 C) (Oral)   Resp 18   LMP 12/30/2019   SpO2 99%  Physical Exam General: Cooperative Cardiac: Normal rate Lungs: No respiratory distress Psych: Inappropriate behavior  ED Course / MDM  EKG:  Clinical Course as of Jan 09 1538  Fri Jan 09, 2020  1846 44 yo female w/ hx of schizophrenia and paranoia, prior psychiatric hospitalizations, presenting to the emergency department under IVC by her mother with concerns for bizarre and aggressive behavior at home.  I spoke to the patient's mother by phone reports that the patient was "threatening to kill me know my family members, she is behaving so bizarre, she is not taking her medications, my kids are scared and ran out of the house yesterday."  Mother reports the patient has been off of her medications for at least 2 months and her behaviors become increasingly erratic.  Her mother reports she does not feel safe with the patient in the house.  I speak to the patient, she denies all these allegations.  She says that she is behaving and mentating normally.  She reports that her mother "is lying to me and I need to see a magistrate."  She agrees that she has not been taking her Haldol because she reports she has had a dystonic reaction with it.  She has also not been taking her Cogentin.  Patient had lab work done in terms of medical clearance, which did not show any acute abnormalities.  There are some very mild hypokalemia and she can be ordered potassium.  Otherwise she is medically cleared for TTS evaluation.  We have upheld an IVC here.   [MT]    Clinical Course User Index [MT] Kimberly Sleeper, MD   I have reviewed the labs performed to date as  well as medications administered while in observation.  Recent changes in the last 24 hours include she has remained stable.  Plan  Current plan is for psychiatric hospitalization. Patient is under full IVC at this time.   Kimberly Bale, MD 01/10/20 1540

## 2020-01-11 DIAGNOSIS — F200 Paranoid schizophrenia: Secondary | ICD-10-CM | POA: Diagnosis not present

## 2020-01-11 DIAGNOSIS — F259 Schizoaffective disorder, unspecified: Secondary | ICD-10-CM | POA: Diagnosis not present

## 2020-01-11 NOTE — BH Assessment (Addendum)
Patient evaluated by Ophelia Shoulder, NP, and inpatient hospitalization was recommended. Notified AC Byrd Hesselbach) of recommendations and bed placement needs. Disposition pending placement at [facility].

## 2020-01-11 NOTE — Consult Note (Addendum)
[facility] Face-to-Face Psychiatry Consult   Reason for Consult: Bizarre behavior Referring Physician: EDP Patient Identification: Kimberly Orozco MRN:  [id number] Principal Diagnosis: Schizophrenia [facility]) Diagnosis:  Principal Problem:   Schizophrenia (HCC)   Total Time spent with patient: 15 minutes  Interval Psych Progress Note 01/11/2020: Attempted telepsych assessment several times earlier today, both were deferred due to patient completing ADLs and cart non-availability Chart reviewed and the patient is seen for face to face assessment. When greeted by this writer she states her full name and date of birth.  Patient is very guarded, but questions why she has not been accepted for inpatient hospitalization.  Explained to patient this is pending as SW is looking for an accepting facility.  She denies allegations of removing her clothes or disrobing in public as stated in IVC.  She has been compliant with po medications until recently when the nurse documented the patient to be upset because she is not being discharged and refusing meds.  She continues to deny SI and HI, denies audible or visual hallucinations.      Interval Psych Progress Note 01/10/2020: Kimberly Orozco is a 44 y.o. female was seen and evaluated face-to-face by nurse practitioner and TTS counselor.  She is denying suicidal or homicidal ideations.  Denies auditory or visual hallucinations.  She reports she is no longer has schizophrenia since she left [place].  States she does not need medications.  Emara reports she was prescribed Haldol however she is no longer taking this medication due to side effects. Per IVC patient has not been compliant with medications with bizarre behavior and hallucinations.  As reported by IVC patient is aggressive, hostile and paranoid.  Per nursing staff patient needs constant redirection and is labile. NP attempted to follow-up with patient's mother for additional collateral.  No answer at [phone].  Attempted to follow-up and was reported that her mother is "sleeping" and unable to come to the phone. Case staffed with attending psychiatrist Rhyanna Sorce.   We will continue to recommend inpatient admission.  Support, encouragement and reassurance was provided.  HPI Per EDP Admission Assessment 01/09/2020:   1847 44 yo female w/ hx of schizophrenia and paranoia, prior psychiatric hospitalizations, presenting to the emergency department under IVC by her mother with concerns for bizarre and aggressive behavior at home.  I spoke to the patient's mother by phone reports that the patient was "threatening to kill me know my family members, she is behaving so bizarre, she is not taking her medications, my kids are scared and ran out of the house yesterday."  Mother reports the patient has been off of her medications for at least 2 months and her behaviors become increasingly erratic.  Her mother reports she does not feel safe with the patient in the house.  I speak to the patient, she denies all these allegations.  She says that she is behaving and mentating normally.  She reports that her mother "is lying to me and I need to see a magistrate."  She agrees that she has not been taking her Haldol because she reports she has had a dystonic reaction with it.  She has also not been taking her Cogentin.  Patient had lab work done in terms of medical clearance, which did not show any acute abnormalities.  There are some very mild hypokalemia and she can be ordered potassium.  Otherwise she is medically cleared for TTS evaluation.  We have upheld an IVC here.     Past Psychiatric History:  Risk to Self: Suicidal Ideation: No Suicidal Intent: No Is patient at risk for suicide?: No Suicidal Plan?: No Access to Means: No What has been your use of drugs/alcohol within the last 12 months?: denies How many times?: 0 Other Self Harm Risks: denies Triggers for Past Attempts: None known Intentional Self Injurious  Behavior: None Risk to Others: Homicidal Ideation: No Thoughts of Harm to Others: No Current Homicidal Intent: No Current Homicidal Plan: No Access to Homicidal Means: No Identified Victim: denies History of harm to others?: No Assessment of Violence: None Noted Violent Behavior Description: denies Does patient have access to weapons?: No Criminal Charges Pending?: No Does patient have a court date: No Prior Inpatient Therapy: Prior Inpatient Therapy: Yes Prior Therapy Dates: 2021 and prior in [place] Prior Therapy Facilty/Provider(s): [facility], hospital in [place] Reason for Treatment: schizophrenia Prior Outpatient Therapy: Prior Outpatient Therapy: Yes Prior Therapy Dates: 2021 Prior Therapy Facilty/Provider(s): [facility] Reason for Treatment: med management Does patient have an ACCT team?: No Does patient have Intensive In-House Services?  : No Does patient have [facility] services? : No Does patient have P4CC services?: No  Past Medical History:  Past Medical History:  Diagnosis Date  . AKI (acute kidney injury) (HCC)   . Bipolar disorder (HCC)   . Hypertension    not on medications  . Schizophrenia [facility])     Past Surgical History:  Procedure Laterality Date  . APPLICATION OF A-CELL OF EXTREMITY N/A 07/08/2019   Procedure: with placement of primatrix AG;  Surgeon: Allena Napoleon, MD;  Location: [facility] OR;  Service: Plastics;  Laterality: N/A;  . APPLICATION OF A-CELL OF EXTREMITY Right 07/22/2019   Procedure: placement of primatrix mesh;  Surgeon: Allena Napoleon, MD;  Location: [facility] OR;  Service: Plastics;  Laterality: Right;  . INCISION AND DRAINAGE OF WOUND N/A 06/26/2019   Procedure: IRRIGATION AND DEBRIDEMENT ABDOMINAL WALL AND GROIN;  Surgeon: Axel Filler, MD;  Location: [facility] OR;  Service: General;  Laterality: N/A;  . INCISION AND DRAINAGE OF WOUND N/A 07/08/2019   Procedure: Debridement of abdominal and groin wound;  Surgeon: Allena Napoleon, MD;  Location: [facility] OR;  Service: Plastics;   Laterality: N/A;  . INCISION AND DRAINAGE PERIRECTAL ABSCESS Right 06/24/2019   Procedure: IRRIGATION AND DEBRIDEMENT RIGHT GROIN AND RIGHT BUTTOCK;  Surgeon: Axel Filler, MD;  Location: [facility] OR;  Service: General;  Laterality: Right;  . IRRIGATION AND DEBRIDEMENT OF WOUND WITH SPLIT THICKNESS SKIN GRAFT Right 07/22/2019   Procedure: Debridement of right thigh wound;  Surgeon: Allena Napoleon, MD;  Location: [facility] OR;  Service: Plastics;  Laterality: Right;  . IRRIGATION AND DEBRIDEMENT OF WOUND WITH SPLIT THICKNESS SKIN GRAFT Right 10/28/2019   Procedure: Debridement of right thigh wound with placement of split thickness skin graft;  Surgeon: Allena Napoleon, MD;  Location: [facility] OR;  Service: Plastics;  Laterality: Right;   Family History:  Family History  Family history unknown: Yes   Family Psychiatric  History:  Social History:  Social History   Substance and Sexual Activity  Alcohol Use Not Currently     Social History   Substance and Sexual Activity  Drug Use Never    Social History   Socioeconomic History  . Marital status: Single    Spouse name: Not on file  . Number of children: Not on file  . Years of education: Not on file  . Highest education level: Not on file  Occupational History  . Not on file  Tobacco Use  . Smoking status: Current Every Day Smoker    Types: Cigarettes  . Smokeless tobacco: Never Used  . Tobacco comment: 4-5 per day   Vaping Use  . Vaping Use: Never used  Substance and Sexual Activity  . Alcohol use: Not Currently  . Drug use: Never  . Sexual activity: Not on file  Other Topics Concern  . Not on file  Social History Narrative  . Not on file   Social Determinants of Health   Financial Resource Strain:   . Difficulty of Paying Living Expenses: Not on file  Food Insecurity:   . Worried About Programme researcher, broadcasting/film/video in the Last Year: Not on file  . Ran Out of Food in the Last Year: Not on file  Transportation Needs:   . Lack of  Transportation (Medical): Not on file  . Lack of Transportation (Non-Medical): Not on file  Physical Activity:   . Days of Exercise per Week: Not on file  . Minutes of Exercise per Session: Not on file  Stress:   . Feeling of Stress : Not on file  Social Connections:   . Frequency of Communication with Friends and Family: Not on file  . Frequency of Social Gatherings with Friends and Family: Not on file  . Attends Religious Services: Not on file  . Active Member of Clubs or Organizations: Not on file  . Attends Banker Meetings: Not on file  . Marital Status: Not on file   Additional Social History:    Allergies:  No Known Allergies  Labs:  Results for orders placed or performed during the hospital encounter of 01/09/20 (from the past 48 hour(s))  Comprehensive metabolic panel     Status: Abnormal   Collection Time: 01/09/20  4:23 PM  Result Value Ref Range   Sodium 136 135 - 145 mmol/L   Potassium 3.2 (L) 3.5 - 5.1 mmol/L   Chloride 95 (L) 98 - 111 mmol/L   CO2 28 22 - 32 mmol/L   Glucose, Bld 118 (H) 70 - 99 mg/dL    Comment: Glucose reference range applies only to samples taken after fasting for at least 8 hours.   BUN 23 (H) 6 - 20 mg/dL   Creatinine, Ser 6.25 (H) 0.44 - 1.00 mg/dL   Calcium 8.2 (L) 8.9 - 10.3 mg/dL   Total Protein 7.9 6.5 - 8.1 g/dL   Albumin 3.4 (L) 3.5 - 5.0 g/dL   AST 14 (L) 15 - 41 U/L   ALT 13 0 - 44 U/L   Alkaline Phosphatase 65 38 - 126 U/L   Total Bilirubin 0.4 0.3 - 1.2 mg/dL   GFR, Estimated 56 (L) >60 mL/min    Comment: (NOTE) Calculated using the CKD-EPI Creatinine Equation (2021)    Anion gap 13 5 - 15    Comment: Performed at [facility], [address], [place], [place] [address]  Acetaminophen level     Status: Abnormal   Collection Time: 01/09/20  4:23 PM  Result Value Ref Range   Acetaminophen (Tylenol), Serum <10 (L) 10 - 30 ug/mL    Comment: (NOTE) Therapeutic concentrations vary  significantly. A range of 10-30 ug/mL  may be an effective concentration for many patients. However, some  are best treated at concentrations outside of this range. Acetaminophen concentrations >150 ug/mL at 4 hours after ingestion  and >50 ug/mL at 12 hours after ingestion are often associated with  toxic reactions.  Performed at  [facility], [address], [place], [place] [address]   Ethanol     Status: None   Collection Time: 01/09/20  4:23 PM  Result Value Ref Range   Alcohol, Ethyl (B) <10 <10 mg/dL    Comment: (NOTE) Lowest detectable limit for serum alcohol is 10 mg/dL.  For medical purposes only. Performed at [facility], [address], [place], [place] [address]   Salicylate level     Status: Abnormal   Collection Time: 01/09/20  4:23 PM  Result Value Ref Range   Salicylate Lvl <7.0 (L) 7.0 - 30.0 mg/dL    Comment: Performed at [facility], [address] [place], [place] [address]  CBC with Differential     Status: Abnormal   Collection Time: 01/09/20  4:23 PM  Result Value Ref Range   WBC 15.2 (H) 4.0 - 10.5 K/uL   RBC 4.73 3.87 - 5.11 MIL/uL   Hemoglobin 13.0 12.0 - 15.0 g/dL   HCT 91.4 36 - 46 %   MCV 84.8 80.0 - 100.0 fL   MCH 27.5 26.0 - 34.0 pg   MCHC 32.4 30.0 - 36.0 g/dL   RDW 78.2 (H) 95.6 - 21.3 %   Platelets 320 150 - 400 K/uL   nRBC 0.0 0.0 - 0.2 %   Neutrophils Relative % 75 %   Neutro Abs 11.4 (H) 1.7 - 7.7 K/uL   Lymphocytes Relative 18 %   Lymphs Abs 2.7 0.7 - 4.0 K/uL   Monocytes Relative 5 %   Monocytes Absolute 0.8 0.1 - 1.0 K/uL   Eosinophils Relative 1 %   Eosinophils Absolute 0.1 0.0 - 0.5 K/uL   Basophils Relative 0 %   Basophils Absolute 0.1 0.0 - 0.1 K/uL   Immature Granulocytes 1 %   Abs Immature Granulocytes 0.11 (H) 0.00 - 0.07 K/uL    Comment: Performed at [facility], [address] [place], [place] [address]  Respiratory Panel by RT PCR (Flu  A&B, Covid) - Nasopharyngeal Swab     Status: None   Collection Time: 01/09/20  4:39 PM   Specimen: Nasopharyngeal Swab  Result Value Ref Range   SARS Coronavirus 2 by RT PCR NEGATIVE NEGATIVE    Comment: (NOTE) SARS-CoV-2 target nucleic acids are NOT DETECTED.  The SARS-CoV-2 RNA is generally detectable in upper respiratoy specimens during the acute phase of infection. The lowest concentration of SARS-CoV-2 viral copies this assay can detect is 131 copies/mL. A negative result does not preclude SARS-Cov-2 infection and should not be used as the sole basis for treatment or other patient management decisions. A negative result may occur with  improper specimen collection/handling, submission of specimen other than nasopharyngeal swab, presence of viral mutation(s) within the areas targeted by this assay, and inadequate number of viral copies (<131 copies/mL). A negative result must be combined with clinical observations, patient history, and epidemiological information. The expected result is Negative.  Fact Sheet for Patients:  https://www.moore.com/  Fact Sheet for Healthcare Providers:  https://www.young.biz/  This test is no t yet approved or cleared by the [place] FDA and  has been authorized for detection and/or diagnosis of SARS-CoV-2 by FDA under an Emergency Use Authorization (EUA). This EUA will remain  in effect (meaning this test can be used) for the duration of the COVID-19 declaration under Section 564(b)(1) of the Act, 21 U.S.C. section 360bbb-3(b)(1), unless the authorization is terminated or revoked sooner.     Influenza  A by PCR NEGATIVE NEGATIVE   Influenza B by PCR NEGATIVE NEGATIVE    Comment: (NOTE) The Xpert Xpress SARS-CoV-2/FLU/RSV assay is intended as an aid in  the diagnosis of influenza from Nasopharyngeal swab specimens and  should not be used as a sole basis for treatment. Nasal washings and  aspirates  are unacceptable for Xpert Xpress SARS-CoV-2/FLU/RSV  testing.  Fact Sheet for Patients: https://www.moore.com/  Fact Sheet for Healthcare Providers: https://www.young.biz/  This test is not yet approved or cleared by the [place] FDA and  has been authorized for detection and/or diagnosis of SARS-CoV-2 by  FDA under an Emergency Use Authorization (EUA). This EUA will remain  in effect (meaning this test can be used) for the duration of the  Covid-19 declaration under Section 564(b)(1) of the Act, 21  U.S.C. section 360bbb-3(b)(1), unless the authorization is  terminated or revoked. Performed at [facility], [address] [place], [place] [address]   Urine rapid drug screen (hosp performed)     Status: None   Collection Time: 01/09/20  8:20 PM  Result Value Ref Range   Opiates NONE DETECTED NONE DETECTED   Cocaine NONE DETECTED NONE DETECTED   Benzodiazepines NONE DETECTED NONE DETECTED   Amphetamines NONE DETECTED NONE DETECTED   Tetrahydrocannabinol NONE DETECTED NONE DETECTED   Barbiturates NONE DETECTED NONE DETECTED    Comment: (NOTE) DRUG SCREEN FOR MEDICAL PURPOSES ONLY.  IF CONFIRMATION IS NEEDED FOR ANY PURPOSE, NOTIFY LAB WITHIN 5 DAYS.  LOWEST DETECTABLE LIMITS FOR URINE DRUG SCREEN Drug Class                     Cutoff (ng/mL) Amphetamine and metabolites    1000 Barbiturate and metabolites    200 Benzodiazepine                 200 Tricyclics and metabolites     300 Opiates and metabolites        300 Cocaine and metabolites        300 THC                            50 Performed at [facility], [address], [place], [place] [address]   Pregnancy, urine     Status: None   Collection Time: 01/09/20  8:20 PM  Result Value Ref Range   Preg Test, Ur NEGATIVE NEGATIVE    Comment:        THE SENSITIVITY OF THIS METHODOLOGY IS >20 mIU/mL. Performed at [facility], [address], [place], [place] [address]     Current Facility-Administered Medications  Medication Dose Route Frequency Provider Last Rate Last Admin  . acetaminophen (TYLENOL) tablet 650 mg  650 mg Oral Q4H PRN Fayrene Helper, PA-C   650 mg at 01/11/20 1102  . alum & mag hydroxide-simeth (MAALOX/MYLANTA) 200-200-20 MG/5ML suspension 30 mL  30 mL Oral Q6H PRN Fayrene Helper, PA-C      . benztropine (COGENTIN) tablet 0.5 mg  0.5 mg Oral BID Oneta Rack, NP      . chlorthalidone (HYGROTON) tablet 25 mg  25 mg Oral Daily Fayrene Helper, PA-C   25 mg at 01/11/20 1044  . docusate sodium (COLACE) capsule 100 mg  100 mg Oral BID Mancel Bale, MD   100 mg at 01/11/20 1043  . LORazepam (ATIVAN) tablet 1 mg  1 mg Oral PRN Fayrene Helper,  PA-C       And  . ziprasidone (GEODON) injection 20 mg  20 mg Intramuscular PRN Fayrene Helperran, [place], PA-C      . nicotine (NICODERM CQ - dosed in mg/24 hours) patch 21 mg  21 mg Transdermal Daily Fayrene Helperran, [place], PA-C      . potassium chloride SA (KLOR-CON) CR tablet 20 mEq  20 mEq Oral Daily Terald Sleeperrifan, Matthew J, MD   20 mEq at 01/11/20 1044  . risperiDONE (RISPERDAL) tablet 1 mg  1 mg Oral Daily Lewis, Tanika N, NP      . risperiDONE (RISPERDAL) tablet 2 mg  2 mg Oral QHS Oneta RackLewis, Tanika N, NP      . zolpidem (AMBIEN) tablet 5 mg  5 mg Oral QHS PRN Fayrene Helperran, [place]ie, PA-C   5 mg at 01/10/20 2114   Current Outpatient Medications  Medication Sig Dispense Refill  . chlorthalidone (HYGROTON) 25 MG tablet Take 1 tablet (25 mg total) by mouth daily. 90 tablet 0    Musculoskeletal: Strength & Muscle Tone: within normal limits Gait & Station: normal Patient leans: N/A  Psychiatric Specialty Exam: Physical Exam Vitals reviewed.  Neurological:     Mental Status: She is alert.  Psychiatric:        Mood and Affect: Mood normal.        Behavior: Behavior normal.     Review of Systems  Psychiatric/Behavioral: Positive for agitation. The patient is nervous/anxious.   All other  systems reviewed and are negative.   Blood pressure (!) 124/94, pulse 92, temperature 98.5 F (36.9 C), temperature source Oral, resp. rate 18, last menstrual period 12/30/2019, SpO2 98 %.There is no height or weight on file to calculate BMI.  General Appearance: Casual  Eye Contact:  Good  Speech:  Clear and Coherent  Volume:  Normal  Mood:  Anxious and Depressed  Affect:  Congruent and Flat  Thought Process:  Coherent  Orientation:  Full (Time, Place, and Person)  Thought Content:  Rumination and Tangential  Suicidal Thoughts:  No  Homicidal Thoughts:  No  Memory:  Immediate;   Fair Recent;   Fair  Judgement:  Fair  Insight:  Fair  Psychomotor Activity:  Normal  Concentration:  Concentration: Fair  Recall:  FiservFair  Fund of Knowledge:  Fair  Language:  Fair  Akathisia:  No  Handed:  Right  AIMS (if indicated):     Assets:  Communication Skills Desire for Improvement Social Support Talents/Skills  ADL's:  Intact  Cognition:  WNL and Impaired,  Mild appears related to psychosis  Sleep:   >6 hours   Disposition: Patient continues to meet criteria for inpatient psychiatric admission and [place] to seek placement.   Treatment Plan Summary: Daily contact with patient to assess and evaluate symptoms and progress in treatment and Medication management  Medications:  -Continue Risperdal 1 mg p.o. daily and 2 mg p.o. qhs -Cogentin 0.5 mg p.o. BID for EPS  Recommend stopping the following medication: Zolpidem 5mg  po qhs sleep;   Risperdal has sedative properties and may help with sleep, if additional medication is needed for sleep, would consider trazodone 50mg  po qhs prn.    Chales AbrahamsShnese E Mills, NP 01/11/2020 2:19 PM  Patient seen via tele health for psychiatric evaluation, chart reviewed and case discussed with the physician extender and developed treatment plan. Reviewed the information documented and agree with the treatment plan. Thedore MinsMojeed Kasiya Burck, MD

## 2020-01-11 NOTE — ED Provider Notes (Signed)
Emergency Medicine Observation Re-evaluation Note  Jessee Newnam is a 44 y.o. female, seen on rounds today.  Pt initially presented to the ED for complaints of Medical Clearance (IVCd) Currently, the patient is awake and calm.  Physical Exam  BP 130/83 (BP Location: Left Arm)   Pulse 72   Temp 98.6 F (37 C) (Oral)   Resp 18   LMP 12/30/2019   SpO2 100%  Physical Exam CONSTITUTIONAL: Well-appearing, NAD NEURO: Awake, alert, interactive ENT/NECK:  supple, no JVD CARDIO: Regular rate, well-perfused PULM: No increased work of breathing GI/GU: Nondistended MSK/SPINE:  No gross deformities, no edema SKIN:  no rash, atraumatic PSYCH: Deferred  ED Course / MDM  EKG:EKG Interpretation  Date/Time:  Saturday January 10 2020 12:57:41 EDT Ventricular Rate:  86 PR Interval:  134 QRS Duration: 88 QT Interval:  390 QTC Calculation: 466 R Axis:   67 Text Interpretation: Normal sinus rhythm Normal ECG When compared with ECG of 10/08/2019, No significant change was found Confirmed by Dione Booze ([id number]) on 01/11/2020 2:11:32 AM  Clinical Course as of Jan 10 801  Fri Jan 09, 2020  1846 44 yo female w/ hx of schizophrenia and paranoia, prior psychiatric hospitalizations, presenting to the emergency department under IVC by her mother with concerns for bizarre and aggressive behavior at home.  I spoke to the patient's mother by phone reports that the patient was "threatening to kill me know my family members, she is behaving so bizarre, she is not taking her medications, my kids are scared and ran out of the house yesterday."  Mother reports the patient has been off of her medications for at least 2 months and her behaviors become increasingly erratic.  Her mother reports she does not feel safe with the patient in the house.  I speak to the patient, she denies all these allegations.  She says that she is behaving and mentating normally.  She reports that her mother "is lying to me and I need to  see a magistrate."  She agrees that she has not been taking her Haldol because she reports she has had a dystonic reaction with it.  She has also not been taking her Cogentin.  Patient had lab work done in terms of medical clearance, which did not show any acute abnormalities.  There are some very mild hypokalemia and she can be ordered potassium.  Otherwise she is medically cleared for TTS evaluation.  We have upheld an IVC here.   [MT]    Clinical Course User Index [MT] Terald Sleeper, MD   I have reviewed the labs performed to date as well as medications administered while in observation.  Recent changes in the last 24 hours include none.  Plan  Current plan is for inpatient placement. Patient is under full IVC at this time.   Sabas Sous, MD 01/11/20 [phone]

## 2020-01-11 NOTE — BH Assessment (Addendum)
Per Brooke Leevy-Johnson,NP, con't inpt tx. Per AC, [facility] will not have an appropriate bed available today. Patient to be reviewed by [facility] AC in the morning for consideration of bed placement.  

## 2020-01-11 NOTE — ED Notes (Signed)
Patient is calm and somewhat cooperative.  She refuses to take her psych meds as her reason is she has severe eps from it in the past.  She does not appear to be responding to internal stimuli at this time.

## 2020-01-12 ENCOUNTER — Encounter (HOSPITAL_COMMUNITY): Payer: Self-pay | Admitting: Registered Nurse

## 2020-01-12 DIAGNOSIS — F259 Schizoaffective disorder, unspecified: Secondary | ICD-10-CM | POA: Diagnosis not present

## 2020-01-12 LAB — CBG MONITORING, ED: Glucose-Capillary: 110 mg/dL — ABNORMAL HIGH (ref 70–99)

## 2020-01-12 MED ORDER — PALIPERIDONE ER 6 MG PO TB24
6.0000 mg | ORAL_TABLET | Freq: Every day | ORAL | Status: DC
  Administered 2020-01-12: 6 mg via ORAL
  Filled 2020-01-12: qty 1

## 2020-01-12 MED ORDER — PSEUDOEPHEDRINE HCL ER 120 MG PO TB12
120.0000 mg | ORAL_TABLET | Freq: Two times a day (BID) | ORAL | Status: DC
  Administered 2020-01-12 – 2020-01-13 (×3): 120 mg via ORAL
  Filled 2020-01-12 (×4): qty 1

## 2020-01-12 MED ORDER — BENZTROPINE MESYLATE 0.5 MG PO TABS: 0.5000 mg | ORAL_TABLET | Freq: Every day | ORAL | Status: DC

## 2020-01-12 MED ORDER — TRAZODONE HCL 50 MG PO TABS
50.0000 mg | ORAL_TABLET | Freq: Every evening | ORAL | Status: DC | PRN
  Administered 2020-01-12: 50 mg via ORAL
  Filled 2020-01-12: qty 1

## 2020-01-12 MED ORDER — ARIPIPRAZOLE 2 MG PO TABS
2.0000 mg | ORAL_TABLET | Freq: Once | ORAL | Status: AC
  Administered 2020-01-13: 2 mg via ORAL
  Filled 2020-01-12: qty 1

## 2020-01-12 MED ORDER — STERILE WATER FOR INJECTION IJ SOLN
INTRAMUSCULAR | Status: AC
  Filled 2020-01-12: qty 10

## 2020-01-12 MED ORDER — PALIPERIDONE PALMITATE ER 234 MG/1.5ML IM SUSY: 234.0000 mg | PREFILLED_SYRINGE | Freq: Once | INTRAMUSCULAR | Status: DC

## 2020-01-12 NOTE — ED Notes (Signed)
Provided pt with snack and juice before bedtime.

## 2020-01-12 NOTE — ED Notes (Signed)
LUNCH TRAY GIVEN. 

## 2020-01-12 NOTE — Consult Note (Signed)
[facility]   Reason for Consult: Bizarre behavior Referring Physician: EDP Patient Identification: Kimberly PillingRebecca Orozco MRN:  [id number] Principal Diagnosis: Schizophrenia [facility](HCC) Diagnosis:  Principal Problem:   Schizophrenia (HCC)      Total Time spent with patient: 30 minutes   Interval Psych Progress Note 11/01//2021 Kimberly Orozco, 44 y.o., female patient seen via tele psych by this provider, consulted with Dr. Bronwen BettersLaubach; and chart reviewed on 01/12/20.  On evaluation Kimberly Orozco reports she is not feeling good related to she is ready to go back home.  Patient states that she is living with her mother, father, and 2 cousins.  Patient states that she has been eating and sleeping without difficulty.  Patient also denies suicidal/homicidal ideation, psychosis, and paranoia.  Patient states that she was brought to the hospital because her mother took out papers on her "I don't know why, it's written on the paper."  Patient states prior to his hospitalization she was not taking any psychotropic medications "Just medicine for my blood pressure."  States that she doesn't need medication.  "I have never told anybody that I was having hallucinations.  I didn't want to comer doe here.  I don't know why people keep lying.  That's why when I get out of here I'm going to the magistrates office to see what I can do about all the lies."  Patient states that she has not spoken to her mother since she has been in the hospital and has been in hospital for 3 days.  Patient gave permission to speak to her mother for collateral information Steward Drone(Brenda at [phone]).   During evaluation Kimberly Orozco is alert/oriented x 4; calm/cooperative throughout assessment;  Her  mood is congruent with affect; she did have some irritability related to being in the hospital and feeling that she don't need to be there.  She does not appear to be responding to internal/external stimuli or delusional  thoughts.  Patient denies suicidal/self-harm/homicidal ideation, psychosis, and paranoia.  Patient answered question appropriately.  Spoke to nursing who reports patient has been doing fine this morning, with no behavioral concerns.  States that patient did become agitated last night when she couldn't have a snack and was given Geodon.      Collateral information: Spoke to patients mother Criss RosalesBrenda Brunetto at [phone].  Mrs. Jim LikeMillner reports patient is living with her and that others in the house hold is patients father and 2 cousins (younger boys).  States "She has been acting a lil bit crazy.  She been calling people telling them lies, calling me a lie, threatening me.  Threatening the boys.  She scared the boys to death.  [place] they was giving her the shot cause she won't take her medicine."  [name] states she doesn't know what medication patient was taking or the last time patient has had any medications.  Reports she hasn't spoken to patient since patient has been in hospital but is willing to call and talk to her to day to see if she is at her baseline.  Also states that patient will need to have outpatient psychiatric services set up for medication management.      [name] and doesn't feel that patient is at her baseline.  States that patient hasn't changed and doesn't feel that the patient is ready to come home.      Interval Psych Progress Note 01/11/2020: Attempted telepsych assessment several times earlier today, both were deferred due to patient  completing ADLs and cart non-availability Chart reviewed and the patient is seen for face to face assessment. When greeted by this writer she states her full name and date of birth.  Patient is very guarded, but questions why she has not been accepted for inpatient hospitalization.  Explained to patient this is pending as SW is looking for an accepting facility.  She denies allegations of removing her clothes or disrobing  in public as stated in IVC.  She has been compliant with po medications until recently when the nurse documented the patient to be upset because she is not being discharged and refusing meds.  She continues to deny SI and HI, denies audible or visual hallucinations.      Interval Psych Progress Note 01/10/2020: Kimberly Orozco is a 44 y.o. female was seen and evaluated face-to-face by nurse practitioner and TTS counselor.  She is denying suicidal or homicidal ideations.  Denies auditory or visual hallucinations.  She reports she is no longer has schizophrenia since she left [place].  States she does not need medications.  Kimberly Orozco reports she was prescribed Haldol however she is no longer taking this medication due to side effects. Per IVC patient has not been compliant with medications with bizarre behavior and hallucinations.  As reported by IVC patient is aggressive, hostile and paranoid.  Per nursing staff patient needs constant redirection and is labile. NP attempted to follow-up with patient's mother for additional collateral.  No answer at [phone]. Attempted to follow-up and was reported that her mother is "sleeping" and unable to come to the phone. Case staffed with attending psychiatrist Akintayo.   We will continue to recommend inpatient admission.  Support, encouragement and reassurance was provided.   HPI Per EDP Admission Assessment 01/09/2020 44 yo female w/ hx of schizophrenia and paranoia, prior psychiatric hospitalizations, presenting to the emergency department under IVC by her mother with concerns for bizarre and aggressive behavior at home.  I spoke to the patient's mother by phone reports that the patient was "threatening to kill me know my family members, she is behaving so bizarre, she is not taking her medications, my kids are scared and ran out of the house yesterday."  Mother reports the patient has been off of her medications for at least 2 months and her behaviors become  increasingly erratic.  Her mother reports she does not feel safe with the patient in the house.  I speak to the patient, she denies all these allegations.  She says that she is behaving and mentating normally.  She reports that her mother "is lying to me and I need to see a magistrate."  She agrees that she has not been taking her Haldol because she reports she has had a dystonic reaction with it.  She has also not been taking her Cogentin.  Patient had lab work done in terms of medical clearance, which did not show any acute abnormalities.  There are some very mild hypokalemia and she can be ordered potassium.  Otherwise she is medically cleared for TTS evaluation.  We have upheld an IVC here   Past Psychiatric History:   Risk to Self: Suicidal Ideation: No Suicidal Intent: No Is patient at risk for suicide?: No Suicidal Plan?: No Access to Means: No What has been your use of drugs/alcohol within the last 12 months?: denies How many times?: 0 Other Self Harm Risks: denies Triggers for Past Attempts: None known Intentional Self Injurious Behavior: None Risk to Others: Homicidal Ideation: No Thoughts  of Harm to Others: No Current Homicidal Intent: No Current Homicidal Plan: No Access to Homicidal Means: No Identified Victim: denies History of harm to others?: No Assessment of Violence: None Noted Violent Behavior Description: denies Does patient have access to weapons?: No Criminal Charges Pending?: No Does patient have a court date: No Prior Inpatient Therapy: Prior Inpatient Therapy: Yes Prior Therapy Dates: 2021 and prior in [place] Prior Therapy Facilty/Provider(s): [facility], hospital in [place] Reason for Treatment: schizophrenia Prior Outpatient Therapy: Prior Outpatient Therapy: Yes Prior Therapy Dates: 2021 Prior Therapy Facilty/Provider(s): [facility] Reason for Treatment: med management Does patient have an ACCT team?: No Does patient have Intensive In-House Services?  : No Does patient  have [facility] services? : No Does patient have P4CC services?: No  Past Medical History:  Past Medical History:  Diagnosis Date  . AKI (acute kidney injury) (HCC)   . Bipolar disorder (HCC)   . Hypertension    not on medications  . Schizophrenia [facility])     Past Surgical History:  Procedure Laterality Date  . APPLICATION OF A-CELL OF EXTREMITY N/A 07/08/2019   Procedure: with placement of primatrix AG;  Surgeon: Allena Napoleon, MD;  Location: [facility] OR;  Service: Plastics;  Laterality: N/A;  . APPLICATION OF A-CELL OF EXTREMITY Right 07/22/2019   Procedure: placement of primatrix mesh;  Surgeon: Allena Napoleon, MD;  Location: [facility] OR;  Service: Plastics;  Laterality: Right;  . INCISION AND DRAINAGE OF WOUND N/A 06/26/2019   Procedure: IRRIGATION AND DEBRIDEMENT ABDOMINAL WALL AND GROIN;  Surgeon: Axel Filler, MD;  Location: [facility] OR;  Service: General;  Laterality: N/A;  . INCISION AND DRAINAGE OF WOUND N/A 07/08/2019   Procedure: Debridement of abdominal and groin wound;  Surgeon: Allena Napoleon, MD;  Location: [facility] OR;  Service: Plastics;  Laterality: N/A;  . INCISION AND DRAINAGE PERIRECTAL ABSCESS Right 06/24/2019   Procedure: IRRIGATION AND DEBRIDEMENT RIGHT GROIN AND RIGHT BUTTOCK;  Surgeon: Axel Filler, MD;  Location: [facility];  Service: General;  Laterality: Right;  . IRRIGATION AND DEBRIDEMENT OF WOUND WITH SPLIT THICKNESS SKIN GRAFT Right 07/22/2019   Procedure: Debridement of right thigh wound;  Surgeon: Allena Napoleon, MD;  Location: [facility] OR;  Service: Plastics;  Laterality: Right;  . IRRIGATION AND DEBRIDEMENT OF WOUND WITH SPLIT THICKNESS SKIN GRAFT Right 10/28/2019   Procedure: Debridement of right thigh wound with placement of split thickness skin graft;  Surgeon: Allena Napoleon, MD;  Location: [facility] OR;  Service: Plastics;  Laterality: Right;   Family History:  Family History  Family history unknown: Yes   Family Psychiatric  History:  Social History:  Social History    Substance and Sexual Activity  Alcohol Use Not Currently     Social History   Substance and Sexual Activity  Drug Use Never    Social History   Socioeconomic History  . Marital status: Single    Spouse name: Not on file  . Number of children: Not on file  . Years of education: Not on file  . Highest education level: Not on file  Occupational History  . Not on file  Tobacco Use  . Smoking status: Current Every Day Smoker    Types: Cigarettes  . Smokeless tobacco: Never Used  . Tobacco comment: 4-5 per day   Vaping Use  . Vaping Use: Never used  Substance and Sexual Activity  . Alcohol use: Not Currently  . Drug use: Never  . Sexual activity: Not on file  Other Topics Concern  . Not on file  Social History Narrative  . Not on file   Social Determinants of Health   Financial Resource Strain:   . Difficulty of Paying Living Expenses: Not on file  Food Insecurity:   . Worried About Programme researcher, broadcasting/film/video in the Last Year: Not on file  . Ran Out of Food in the Last Year: Not on file  Transportation Needs:   . Lack of Transportation (Medical): Not on file  . Lack of Transportation (Non-Medical): Not on file  Physical Activity:   . Days of Exercise per Week: Not on file  . Minutes of Exercise per Session: Not on file  Stress:   . Feeling of Stress : Not on file  Social Connections:   . Frequency of Communication with Friends and Family: Not on file  . Frequency of Social Gatherings with Friends and Family: Not on file  . Attends Religious Services: Not on file  . Active Member of Clubs or Organizations: Not on file  . Attends Banker Meetings: Not on file  . Marital Status: Not on file   Additional Social History:    Allergies:  No Known Allergies  Labs:  Results for orders placed or performed during the hospital encounter of 01/09/20 (from the past 48 hour(s))  CBG monitoring, ED     Status: Abnormal   Collection Time: 01/12/20  3:10 AM  Result  Value Ref Range   Glucose-Capillary 110 (H) 70 - 99 mg/dL    Comment: Glucose reference range applies only to samples taken after fasting for at least 8 hours.    Current Facility-Administered Medications  Medication Dose Route Frequency Provider Last Rate Last Admin  . acetaminophen (TYLENOL) tablet 650 mg  650 mg Oral Q4H PRN Fayrene Helper, PA-C   650 mg at 01/12/20 1216  . alum & mag hydroxide-simeth (MAALOX/MYLANTA) 200-200-20 MG/5ML suspension 30 mL  30 mL Oral Q6H PRN Fayrene Helper, PA-C      . benztropine (COGENTIN) tablet 0.5 mg  0.5 mg Oral BID Oneta Rack, NP      . chlorthalidone (HYGROTON) tablet 25 mg  25 mg Oral Daily Fayrene Helper, PA-C   25 mg at 01/12/20 1008  . docusate sodium (COLACE) capsule 100 mg  100 mg Oral BID Mancel Bale, MD   100 mg at 01/12/20 1008  . LORazepam (ATIVAN) tablet 1 mg  1 mg Oral PRN Fayrene Helper, PA-C      . nicotine (NICODERM CQ - dosed in mg/24 hours) patch 21 mg  21 mg Transdermal Daily Fayrene Helper, PA-C      . paliperidone (INVEGA) 24 hr tablet 6 mg  6 mg Oral Daily Ebunoluwa Gernert B, NP      . potassium chloride SA (KLOR-CON) CR tablet 20 mEq  20 mEq Oral Daily Terald Sleeper, MD   20 mEq at 01/12/20 1008  . pseudoephedrine (SUDAFED) 12 hr tablet 120 mg  120 mg Oral BID Linwood Dibbles, MD      . sterile water (preservative free) injection           . zolpidem (AMBIEN) tablet 5 mg  5 mg Oral QHS PRN Fayrene Helper, PA-C   5 mg at 01/12/20 7262   Current Outpatient Medications  Medication Sig Dispense Refill  . chlorthalidone (HYGROTON) 25 MG tablet Take 1 tablet (25 mg total) by mouth daily. 90 tablet 0    Musculoskeletal: Strength & Muscle Tone: within normal limits  Gait & Station: normal Patient leans: N/A  Psychiatric Specialty Exam: Physical Exam Vitals reviewed.  Constitutional:      Appearance: Normal appearance. She is obese.  Pulmonary:     Effort: Pulmonary effort is normal.  Musculoskeletal:        General: Normal range of  motion.  Neurological:     Mental Status: She is alert and oriented to person, place, and time.  Psychiatric:        Attention and Perception: Attention and perception normal.        Mood and Affect: Mood and affect normal.        Speech: Speech normal.        Behavior: Behavior normal. Behavior is cooperative.        Thought Content: Thought content is not paranoid. Thought content does not include homicidal or suicidal ideation.        Cognition and Memory: Cognition normal.        Judgment: Judgment is impulsive.     Review of Systems  Psychiatric/Behavioral: Positive for agitation. Hallucinations: Denies. Self-injury: Denies. Suicidal ideas: Denies. The patient is nervous/anxious.   All other systems reviewed and are negative.   Blood pressure (!) 131/94, pulse 92, temperature 97.7 F (36.5 C), temperature source Oral, resp. rate 19, last menstrual period 12/30/2019, SpO2 96 %.There is no height or weight on file to calculate BMI.  General Appearance: Casual  Eye Contact:  Good  Speech:  Clear and Coherent  Volume:  Normal  Mood:  Anxious and Depressed  Affect:  Congruent and Flat  Thought Process:  Coherent  Orientation:  Full (Time, Place, and Person)  Thought Content:  Tangential  Suicidal Thoughts:  No  Homicidal Thoughts:  No  Memory:  Immediate;   Fair Recent;   Fair  Judgement:  Fair  Insight:  Fair  Psychomotor Activity:  Normal  Concentration:  Concentration: Fair  Recall:  [employer] of Knowledge:  Fair  Language:  Fair  Akathisia:  No  Handed:  Right  AIMS (if indicated):     Assets:  Communication Skills Desire for Improvement Social Support Talents/Skills  ADL's:  Intact  Cognition:  WNL and Impaired,  Mild appears related to psychosis  Sleep:   >6 hours   Disposition: Patient continues to meet criteria for inpatient psychiatric admission and Sw to seek placement.   Treatment Plan Summary: Daily contact with patient to assess and evaluate  symptoms and progress in treatment and Medication management   Medication management: Discontinued Risperdal   Discontinued Cogentin 0.5 mg Bid  Ordered Invega 6 mg for one dose.  If patient is able to tolerate will start Invega Sustenna 234 mg (loading dose) if there is no adverse reaction to the PO form of Invega.  Also started Trazodone 50 mg Qhs for sleep   Disposition:  Continue to seek psychiatric hospitalization Recommend psychiatric Inpatient admission when medically cleared.  Kimberly Endres, NP 01/12/2020 12:27 PM

## 2020-01-12 NOTE — ED Notes (Signed)
Pt has been calm with staff. Pt denies SI/HI and A/V hallucinations.  Pt wants to be DCd Pt was compliant with Invega.

## 2020-01-12 NOTE — BH Assessment (Addendum)
BHH Assessment Progress Note  Per Shuvon Rankin, NP, this pt requires psychiatric hospitalization.  Pt presents under IVC initiated by pt's mother and upheld by EDP Alvester Chou, MD.  Per Herbert Seta at [facility], no suitable bed will be available for pt today.  I have reached out to Aquanetta at [facility]; response is pending as is final disposition as of this writing.  Doylene Canning, [place] Behavioral Health Coordinator [phone]  Addendum:  Per Les Pou, MD, [facility] will not have a suitable bed for pt today.  Final disposition is pending.  Doylene Canning, [place] Behavioral Health Coordinator [phone]

## 2020-01-12 NOTE — ED Notes (Signed)
PT becoming upset that she cant go outside or "do what she wants to do".  Attempted to discuss situation with pt.  PT requests something for sleep and to help calm her down.  To this point pt has been refusing her prescribed daily meds.  Pt was given Ambien earlier, but has not gone to sleep since then.  Pulled PRN Ativan for anxiety/agitation and pt refused to take that medication as well.  Med returned to Pyxis at this time.

## 2020-01-12 NOTE — ED Provider Notes (Addendum)
Emergency Medicine Observation Re-evaluation Note  Kimberly Orozco is a 44 y.o. female, seen on rounds today.  Pt initially presented to the ED for complaints of Medical Clearance (IVCd) Currently, the patient is calm but wants to leave.  Physical Exam  BP 95/62 (BP Location: Left Arm)   Pulse 84   Temp 97.7 F (36.5 C) (Oral)   Resp 19   LMP 12/30/2019   SpO2 96%  Physical Exam General: Alert and awake, cooperative Cardiac: Regular rate and rhythm Lungs: Clear to auscultation Psych: Depressed mood  ED Course / MDM  EKG:EKG Interpretation  Date/Time:  Saturday January 10 2020 12:57:41 EDT Ventricular Rate:  86 PR Interval:  134 QRS Duration: 88 QT Interval:  390 QTC Calculation: 466 R Axis:   67 Text Interpretation: Normal sinus rhythm Normal ECG When compared with ECG of 10/08/2019, No significant change was found Confirmed by Dione Booze ([id number]) on 01/11/2020 2:11:32 AM  Clinical Course as of Jan 12 911  Fri Jan 09, 2020  1846 44 yo female w/ hx of schizophrenia and paranoia, prior psychiatric hospitalizations, presenting to the emergency department under IVC by her mother with concerns for bizarre and aggressive behavior at home.  I spoke to the patient's mother by phone reports that the patient was "threatening to kill me know my family members, she is behaving so bizarre, she is not taking her medications, my kids are scared and ran out of the house yesterday."  Mother reports the patient has been off of her medications for at least 2 months and her behaviors become increasingly erratic.  Her mother reports she does not feel safe with the patient in the house.  I speak to the patient, she denies all these allegations.  She says that she is behaving and mentating normally.  She reports that her mother "is lying to me and I need to see a magistrate."  She agrees that she has not been taking her Haldol because she reports she has had a dystonic reaction with it.  She has also not  been taking her Cogentin.  Patient had lab work done in terms of medical clearance, which did not show any acute abnormalities.  There are some very mild hypokalemia and she can be ordered potassium.  Otherwise she is medically cleared for TTS evaluation.  We have upheld an IVC here.   [MT]    Clinical Course User Index [MT] Terald Sleeper, MD   I have reviewed the labs performed to date as well as medications administered while in observation.  Recent changes in the last 24 hours include patient had recurrent episodes of agitation and wanting to leave.  Patient was angry about being in the ED.  Patient did require dose of IM Geodon for her agitation early this morning.  Plan  Current plan is for patient psychiatric treatment. Patient is under full IVC at this time.   Linwood Dibbles, MD 01/12/20 [phone]  Notified pt has not been taking her meds.  Psychiatry considering forced medications.  I am Concerned that her psychiatric illness will worsen. Warrants giving her meds despite her wishes because of her mental health condition.    Linwood Dibbles, MD 01/12/20 1003

## 2020-01-12 NOTE — Consult Note (Signed)
Invega long acting injectable is not cover by patient insurance so would not be beneficial to start on patient.   Recommendation:   Discontinue Invega Order Abilify 1 mg PO x 1 dose.  If patient is able to tolerate with no adverse reaction will order Abilify Maintena.

## 2020-01-12 NOTE — ED Notes (Signed)
Pt calm. Cooperative with staff.  Pt stated" not making threatening calls, just does not get along with family".

## 2020-01-12 NOTE — ED Notes (Signed)
Pt refusing to stay in room and getting agitated towards staff about not beng allowed to get a snack at this time.  Pt States that she 9s getting a Clinical research associate and taking this nurse to court.  Pt requested to speak to the charge nurse.  Charge nurse notified of pt request.  PT became agitated again with charge nurse and stated that she was going to "take her job".  Due to pt's refusal to take PO Ativan, IM Geodon was given to help with pt's agitation.  Respirations even and unlabored at this time.  NADN.  Will continue to monitor.

## 2020-01-12 NOTE — ED Notes (Signed)
Pt alert and oriented this shift. Pt calm and cooperative with staff. Pt talking clearly with clear thoughts. Pt refused Risperdal and Cogentin this AM. Pt eating and drinking.

## 2020-01-13 ENCOUNTER — Inpatient Hospital Stay (HOSPITAL_COMMUNITY): Admission: AD | Admit: 2020-01-13 | Payer: Medicare Other | Admitting: Psychiatry

## 2020-01-13 DIAGNOSIS — F259 Schizoaffective disorder, unspecified: Secondary | ICD-10-CM | POA: Diagnosis not present

## 2020-01-13 MED ORDER — ARIPIPRAZOLE 5 MG PO TABS: 5.0000 mg | ORAL_TABLET | Freq: Every day | ORAL | 0 refills | Status: DC

## 2020-01-13 MED ORDER — ARIPIPRAZOLE ER 400 MG IM SRER
400.0000 mg | INTRAMUSCULAR | Status: DC
  Administered 2020-01-13: 400 mg via INTRAMUSCULAR
  Filled 2020-01-13: qty 2

## 2020-01-13 MED ORDER — ARIPIPRAZOLE 5 MG PO TABS: 5.0000 mg | ORAL_TABLET | Freq: Every day | ORAL | Status: DC

## 2020-01-13 MED ORDER — TRAZODONE HCL 50 MG PO TABS: 50.0000 mg | ORAL_TABLET | Freq: Every day | ORAL | 0 refills | Status: DC

## 2020-01-13 NOTE — Discharge Instructions (Signed)
For your behavioral health needs, you are advised to follow up with [facility].  You have an in-person appointment with Toy Cookey, NP scheduled for Wednesday, February 25, 2020 at 1:00 pm:       [facility]      [address]      [place], [place] [address]      [phone]

## 2020-01-13 NOTE — ED Notes (Signed)
Pt does not report any adverse reactions to Abilify. There is no rash on back or stomach/chest.

## 2020-01-13 NOTE — BH Assessment (Signed)
BHH Assessment Progress Note  Per Shuvon Rankin, NP, this pt does not require psychiatric hospitalization at this time.  Pt presents under IVC initiated by pt's mother and upheld by EDP Alvester Chou, MD, which has been rescinded by Nelly Rout, MD.  Pt is psychiatrically cleared.  Per [employer] request, this writer called [facility] to schedule an in-person intake appointment for pt with psychiatry.  At 11:49 I called and spoke to [facility].  Appointment for Wednesday, 02/25/2020 at 13:00 with Toy Cookey, NP has been included in pt's discharge instructions.  A social work consult has been ordered to help make preliminary arrangements for transportation.  EDP Kennis Carina, MD and pt's nurse, Diane, have been notified.  Doylene Canning, MA Triage Specialist [phone]

## 2020-01-13 NOTE — Progress Notes (Addendum)
TOC CM/CSW and RN spoke with pt in regards to pt returning back to mom's home/Brenda Hre[place].  Pts mom/Brenda Connolly does not want pt to return to her home.  TOC CM/CSW and RN advised pt to seek housing with a group home that way pt cld receive housing and medical care that she needs.  Pt was not interested in group home, that she was going to her mom's because she pays bills there and mom has to give her a 30 day notice.  CSW and RN warned pt that mom did not have to give her a notice because she lives in [address] housing and she is not suppose to be there anyway, only the ppl on her lease is to reside in mom's home.  Pt stated she has applied for housing through DSS, she is on the waiting list.  Pt stated she will return home to get her belongs and seek housing with family until she gets housing through DSS. CSW and RN advised pt to seek housing elsewhere because mom/Brenda Chiu does not want her at her home.  Pt asked for transportation because she was unfamiliar with bus system.  CSW assisted pt a [employer] taxi voucher.  Pt was dc'd safely.  CSW contacted pts mom/Brenda Bergeman, a "[employer] to [employer]".  Due to the fact that pt was told that mom/Brenda Glock did not want her to return to her home and pt insisted that she was going there to get her belongs.  CSW will continue to follow for dc needs.  Libni Fusaro Tarpley-Carter, MSW, LCSW-A Pronouns:  She, Her, Hers                  [facility] Transitions of CareClinical Social Worker [email or web address] [phone]

## 2020-01-13 NOTE — Progress Notes (Signed)
..   Transition of Care [facility]) - Emergency Department Mini Assessment   Patient Details  Name: Kimberly Orozco MRN: [id number] Date of Birth: [birth year]  Transition of [facility]) CM/SW Contact:    Samadhi Mahurin C Tarpley-Carter, LCSWA Phone Number: 01/13/2020, 3:03 PM   Clinical Narrative: TOC CM/CSW spoke with pt.  Pt gave CSW permission to speak with mom/Brenda Kye.  Teneka Malmberg is considering allowing the pt to come back to her home.  Steward Drone would give CSW a call back after dr's appt.  Kenzo Ozment Tarpley-Carter, MSW, LCSW-A Pronouns:  She, Her, Hers                  [facility] Transitions of CareClinical Social Worker [email or web address] [phone]   ED Mini Assessment: What brought you to the Emergency Department? : Mental Health  Barriers to Discharge: Family Issues  Barrier interventions: Family is exhausted by pts outbursts and behaviors.  Means of departure: Police  Interventions which prevented an admission or readmission: Transportation Screening, Homeless Screening    Patient Contact and Communications Key Contact 1: Criss Rosales   Spoke with: Criss Rosales Contact Date: 01/13/20,   Contact time: 0210 Contact Phone Number: [phone] or [phone] Call outcome: Steward Drone spoke with CSW about pt and their lived experience with pt.  Patient states their goals for this hospitalization and ongoing recovery are:: Get transportation home.      Admission diagnosis:  IVC Patient Active Problem List   Diagnosis Date Noted  . Schizoaffective disorder (HCC)   . Healthcare maintenance 12/11/2019  . Hypertension 11/27/2019  . Open abdominal wall wound 08/04/2019  . Open thigh wound, right, subsequent encounter 08/04/2019  . Necrotizing cellulitis   . Bipolar disorder (HCC) 04/26/2019  . Schizophrenia (HCC) 04/25/2019   PCP:  [name], MD Pharmacy:   [facility] [phone] - [place], [place] - [phone] AT  [facility] [address] [place] [address] Phone: [phone] Fax: [phone]

## 2020-01-13 NOTE — ED Notes (Signed)
Pt discharged home. Discharged instructions read to pt who verbalized understanding. All belongings returned to pt. Denies SI/HI, is not delusional and not responding to internal stimuli. Escorted pt to the ED exit.   

## 2020-01-13 NOTE — ED Notes (Signed)
Per SW pt's mother will not allow her back home and her mother is going to call SW after her doctor's appointment to talk about the situation. Pt continues to be followed by SW.

## 2020-01-13 NOTE — ED Notes (Signed)
Patient resting rise and fall of chest breathing observed

## 2020-01-13 NOTE — Consult Note (Signed)
[facility] Face-to-Face Psychiatry Consult   Reason for Consult: Bizarre behavior Referring Physician: EDP Patient Identification: Kimberly Orozco MRN:  [id number] Principal Diagnosis: Schizophrenia [facility]) Diagnosis:  Principal Problem:   Schizophrenia (HCC)   Total Time spent with patient: 30 minutes   Interval Psych Progress Note 11/02//2021 Kimberly Orozco, 44 y.o., female patient seen for psychiatric re evaluation via tele psych by this provider, consulted with Dr. Bronwen Betters; and chart reviewed on 01/13/20.  On evaluation Kimberly Orozco reports she is feeling better and ready to go home.  States that the beds are uncomfortable and her back is hurting.  Patient states that she has tolerated the medications with no adverse reactions.  Eating and sleeping without difficulty.  Patient states that she will need psychiatric outpatient services and also assistance with transportation because she has no one that can take her to her appointment and she doesn't have a car.  States that she does have Medicaid.  Discussed Medicaid transport and social work referral to assist with resources and referral.   During evaluation Kimberly Orozco is alert/oriented x 4; calm/cooperative; and mood is congruent with affect.  She does not appear to be responding to internal/external stimuli or delusional thoughts.  Patient denies suicidal/self-harm/homicidal ideation, psychosis, and paranoia.  Patient answered question appropriately.  Patient nurse reports that patient has been calm with no behavioral outburst.       Interval Psych Progress Note 11/01//2021 Kimberly Orozco, 44 y.o., female patient seen via tele psych by this provider, consulted with Dr. Bronwen Betters; and chart reviewed on 01/13/20.  On evaluation Kimberly Orozco reports she is not feeling good related to she is ready to go back home.  Patient states that she is living with her mother, father, and 2 cousins.  Patient states that she has been eating and sleeping  without difficulty.  Patient also denies suicidal/homicidal ideation, psychosis, and paranoia.  Patient states that she was brought to the hospital because her mother took out papers on her "I don't know why, it's written on the paper."  Patient states prior to his hospitalization she was not taking any psychotropic medications "Just medicine for my blood pressure."  States that she doesn't need medication.  "I have never told anybody that I was having hallucinations.  I didn't want to comer doe here.  I don't know why people keep lying.  That's why when I get out of here I'm going to the magistrates office to see what I can do about all the lies."  Patient states that she has not spoken to her mother since she has been in the hospital and has been in hospital for 3 days.  Patient gave permission to speak to her mother for collateral information Kimberly Orozco at [phone]).   During evaluation Kimberly Orozco is alert/oriented x 4; calm/cooperative throughout assessment;  Her  mood is congruent with affect; she did have some irritability related to being in the hospital and feeling that she don't need to be there.  She does not appear to be responding to internal/external stimuli or delusional thoughts.  Patient denies suicidal/self-harm/homicidal ideation, psychosis, and paranoia.  Patient answered question appropriately.  Spoke to nursing who reports patient has been doing fine this morning, with no behavioral concerns.  States that patient did become agitated last night when she couldn't have a snack and was given Geodon.      Collateral information: Spoke to patients mother Kimberly Orozco at [phone].  [name] and that others in  the house hold is patients father and 2 cousins (younger boys).  States "She has been acting a lil bit crazy.  She been calling people telling them lies, calling me a lie, threatening me.  Threatening the boys.  She scared the boys to death.   In [place] they was giving her the shot cause she won't take her medicine."  [name] states she doesn't know what medication patient was taking or the last time patient has had any medications.  Reports she hasn't spoken to patient since patient has been in hospital but is willing to call and talk to her to day to see if she is at her baseline.  Also states that patient will need to have outpatient psychiatric services set up for medication management.      [name] and doesn't feel that patient is at her baseline.  States that patient hasn't changed and doesn't feel that the patient is ready to come home.      Interval Psych Progress Note 01/11/2020: Attempted telepsych assessment several times earlier today, both were deferred due to patient completing ADLs and cart non-availability Chart reviewed and the patient is seen for face to face assessment. When greeted by this writer she states her full name and date of birth.  Patient is very guarded, but questions why she has not been accepted for inpatient hospitalization.  Explained to patient this is pending as SW is looking for an accepting facility.  She denies allegations of removing her clothes or disrobing in public as stated in IVC.  She has been compliant with po medications until recently when the nurse documented the patient to be upset because she is not being discharged and refusing meds.  She continues to deny SI and HI, denies audible or visual hallucinations.      Interval Psych Progress Note 01/10/2020: Kimberly PillingRebecca Orozco is a 44 y.o. female was seen and evaluated face-to-face by nurse practitioner and TTS counselor.  She is denying suicidal or homicidal ideations.  Denies auditory or visual hallucinations.  She reports she is no longer has schizophrenia since she left [place].  States she does not need medications.  [name] reports she was prescribed Haldol however she is no longer taking this medication due to side  effects. Per IVC patient has not been compliant with medications with bizarre behavior and hallucinations.  As reported by IVC patient is aggressive, hostile and paranoid.  Per nursing staff patient needs constant redirection and is labile. NP attempted to follow-up with patient's mother for additional collateral.  No answer at [phone]. Attempted to follow-up and was reported that her mother is "sleeping" and unable to come to the phone. Case staffed with attending psychiatrist Kimberly Orozco.   We will continue to recommend inpatient admission.  Support, encouragement and reassurance was provided.   HPI Per EDP Admission Assessment 01/09/2020 44 yo female w/ hx of schizophrenia and paranoia, prior psychiatric hospitalizations, presenting to the emergency department under IVC by her mother with concerns for bizarre and aggressive behavior at home.  I spoke to the patient's mother by phone reports that the patient was "threatening to kill me know my family members, she is behaving so bizarre, she is not taking her medications, my kids are scared and ran out of the house yesterday."  Mother reports the patient has been off of her medications for at least 2 months and her behaviors become increasingly erratic.  Her mother reports she does not feel safe with  the patient in the house.  I speak to the patient, she denies all these allegations.  She says that she is behaving and mentating normally.  She reports that her mother "is lying to me and I need to see a magistrate."  She agrees that she has not been taking her Haldol because she reports she has had a dystonic reaction with it.  She has also not been taking her Cogentin.  Patient had lab work done in terms of medical clearance, which did not show any acute abnormalities.  There are some very mild hypokalemia and she can be ordered potassium.  Otherwise she is medically cleared for TTS evaluation.  We have upheld an IVC here   Past Psychiatric History:    Risk to Self: Suicidal Ideation: No Suicidal Intent: No Is patient at risk for suicide?: No Suicidal Plan?: No Access to Means: No What has been your use of drugs/alcohol within the last 12 months?: denies How many times?: 0 Other Self Harm Risks: denies Triggers for Past Attempts: None known Intentional Self Injurious Behavior: None Risk to Others: Homicidal Ideation: No Thoughts of Harm to Others: No Current Homicidal Intent: No Current Homicidal Plan: No Access to Homicidal Means: No Identified Victim: denies History of harm to others?: No Assessment of Violence: None Noted Violent Behavior Description: denies Does patient have access to weapons?: No Criminal Charges Pending?: No Does patient have a court date: No Prior Inpatient Therapy: Prior Inpatient Therapy: Yes Prior Therapy Dates: 2021 and prior in [place] Prior Therapy Facilty/Provider(s): [facility], hospital in [place] Reason for Treatment: schizophrenia Prior Outpatient Therapy: Prior Outpatient Therapy: Yes Prior Therapy Dates: 2021 Prior Therapy Facilty/Provider(s): [facility] Reason for Treatment: med management Does patient have an ACCT team?: No Does patient have Intensive In-House Services?  : No Does patient have [facility] services? : No Does patient have P4CC services?: No  Past Medical History:  Past Medical History:  Diagnosis Date  . AKI (acute kidney injury) (HCC)   . Bipolar disorder (HCC)   . Hypertension    not on medications  . Schizophrenia [facility])     Past Surgical History:  Procedure Laterality Date  . APPLICATION OF A-CELL OF EXTREMITY N/A 07/08/2019   Procedure: with placement of primatrix AG;  Surgeon: Allena Napoleon, MD;  Location: [facility] OR;  Service: Plastics;  Laterality: N/A;  . APPLICATION OF A-CELL OF EXTREMITY Right 07/22/2019   Procedure: placement of primatrix mesh;  Surgeon: Allena Napoleon, MD;  Location: [facility] OR;  Service: Plastics;  Laterality: Right;  . INCISION AND DRAINAGE OF WOUND N/A  06/26/2019   Procedure: IRRIGATION AND DEBRIDEMENT ABDOMINAL WALL AND GROIN;  Surgeon: Axel Filler, MD;  Location: [facility] OR;  Service: General;  Laterality: N/A;  . INCISION AND DRAINAGE OF WOUND N/A 07/08/2019   Procedure: Debridement of abdominal and groin wound;  Surgeon: Allena Napoleon, MD;  Location: [facility] OR;  Service: Plastics;  Laterality: N/A;  . INCISION AND DRAINAGE PERIRECTAL ABSCESS Right 06/24/2019   Procedure: IRRIGATION AND DEBRIDEMENT RIGHT GROIN AND RIGHT BUTTOCK;  Surgeon: Axel Filler, MD;  Location: [facility] OR;  Service: General;  Laterality: Right;  . IRRIGATION AND DEBRIDEMENT OF WOUND WITH SPLIT THICKNESS SKIN GRAFT Right 07/22/2019   Procedure: Debridement of right thigh wound;  Surgeon: Allena Napoleon, MD;  Location: [facility] OR;  Service: Plastics;  Laterality: Right;  . IRRIGATION AND DEBRIDEMENT OF WOUND WITH SPLIT THICKNESS SKIN GRAFT Right 10/28/2019   Procedure: Debridement of right thigh wound  with placement of split thickness skin graft;  Surgeon: Allena Napoleon, MD;  Location: [facility] OR;  Service: Plastics;  Laterality: Right;   Family History:  Family History  Family history unknown: Yes   Family Psychiatric  History:  Social History:  Social History   Substance and Sexual Activity  Alcohol Use Not Currently     Social History   Substance and Sexual Activity  Drug Use Never    Social History   Socioeconomic History  . Marital status: Single    Spouse name: Not on file  . Number of children: Not on file  . Years of education: Not on file  . Highest education level: Not on file  Occupational History  . Not on file  Tobacco Use  . Smoking status: Current Every Day Smoker    Types: Cigarettes  . Smokeless tobacco: Never Used  . Tobacco comment: 4-5 per day   Vaping Use  . Vaping Use: Never used  Substance and Sexual Activity  . Alcohol use: Not Currently  . Drug use: Never  . Sexual activity: Not on file  Other Topics Concern  . Not on file  Social  History Narrative  . Not on file   Social Determinants of Health   Financial Resource Strain:   . Difficulty of Paying Living Expenses: Not on file  Food Insecurity:   . Worried About Programme researcher, broadcasting/film/video in the Last Year: Not on file  . Ran Out of Food in the Last Year: Not on file  Transportation Needs:   . Lack of Transportation (Medical): Not on file  . Lack of Transportation (Non-Medical): Not on file  Physical Activity:   . Days of Exercise per Week: Not on file  . Minutes of Exercise per Session: Not on file  Stress:   . Feeling of Stress : Not on file  Social Connections:   . Frequency of Communication with Friends and Family: Not on file  . Frequency of Social Gatherings with Friends and Family: Not on file  . Attends Religious Services: Not on file  . Active Member of Clubs or Organizations: Not on file  . Attends Banker Meetings: Not on file  . Marital Status: Not on file   Additional Social History:    Allergies:  No Known Allergies  Labs:  Results for orders placed or performed during the hospital encounter of 01/09/20 (from the past 48 hour(s))  CBG monitoring, ED     Status: Abnormal   Collection Time: 01/12/20  3:10 AM  Result Value Ref Range   Glucose-Capillary 110 (H) 70 - 99 mg/dL    Comment: Glucose reference range applies only to samples taken after fasting for at least 8 hours.    Current Facility-Administered Medications  Medication Dose Route Frequency Provider Last Rate Last Admin  . acetaminophen (TYLENOL) tablet 650 mg  650 mg Oral Q4H PRN Fayrene Helper, PA-C   650 mg at 01/13/20 0250  . alum & mag hydroxide-simeth (MAALOX/MYLANTA) 200-200-20 MG/5ML suspension 30 mL  30 mL Oral Q6H PRN Fayrene Helper, PA-C      . ARIPiprazole ER (ABILIFY MAINTENA) injection 400 mg  400 mg Intramuscular Q28 days Kimberly Orozco B, NP      . chlorthalidone (HYGROTON) tablet 25 mg  25 mg Oral Daily Fayrene Helper, PA-C   25 mg at 01/13/20 1103  . docusate  sodium (COLACE) capsule 100 mg  100 mg Oral BID Mancel Bale, MD   100 mg  at 01/13/20 1103  . nicotine (NICODERM CQ - dosed in mg/24 hours) patch 21 mg  21 mg Transdermal Daily Fayrene Helper, PA-C      . potassium chloride SA (KLOR-CON) CR tablet 20 mEq  20 mEq Oral Daily Terald Sleeper, MD   20 mEq at 01/13/20 1103  . pseudoephedrine (SUDAFED) 12 hr tablet 120 mg  120 mg Oral BID Linwood Dibbles, MD   120 mg at 01/13/20 1103  . traZODone (DESYREL) tablet 50 mg  50 mg Oral QHS PRN Kimberly Orozco B, NP   50 mg at 01/12/20 1958   Current Outpatient Medications  Medication Sig Dispense Refill  . chlorthalidone (HYGROTON) 25 MG tablet Take 1 tablet (25 mg total) by mouth daily. 90 tablet 0    Musculoskeletal: Strength & Muscle Tone: within normal limits Gait & Station: normal Patient leans: N/A  Psychiatric Specialty Exam: Physical Exam Vitals reviewed.  Constitutional:      Appearance: Normal appearance. She is obese.  Pulmonary:     Effort: Pulmonary effort is normal.  Musculoskeletal:        General: Normal range of motion.  Neurological:     Mental Status: She is alert and oriented to person, place, and time.  Psychiatric:        Attention and Perception: Attention and perception normal. She does not perceive auditory or visual hallucinations.        Mood and Affect: Mood and affect normal.        Speech: Speech normal.        Behavior: Behavior normal. Behavior is not agitated or aggressive. Behavior is cooperative.        Thought Content: Thought content is not paranoid or delusional. Thought content does not include homicidal or suicidal ideation.        Cognition and Memory: Cognition normal.        Judgment: Judgment is impulsive.     Review of Systems  Psychiatric/Behavioral: Negative for agitation, confusion, hallucinations (Denies), self-injury (Denies), sleep disturbance and suicidal ideas (Denies). The patient is not nervous/anxious.   All other systems reviewed and are  negative.   Blood pressure 119/77, pulse 79, temperature 97.9 F (36.6 C), temperature source Oral, resp. rate 20, last menstrual period 12/30/2019, SpO2 100 %.There is no height or weight on file to calculate BMI.  General Appearance: Casual  Eye Contact:  Good  Speech:  Clear and Coherent  Volume:  Normal  Mood:  "Better"  Affect:  Appropriate and Congruent  Thought Process:  Coherent  Orientation:  Full (Time, Place, and Person)  Thought Content:  WDL  Suicidal Thoughts:  No  Homicidal Thoughts:  No  Memory:  Immediate;   Fair Recent;   Fair  Judgement:  Fair  Insight:  Present  Psychomotor Activity:  Normal  Concentration:  Concentration: Fair  Recall:  [employer] of Knowledge:  Fair  Language:  Fair  Akathisia:  No  Handed:  Right  AIMS (if indicated):     Assets:  Communication Skills Desire for Improvement Social Support Talents/Skills  ADL's:  Intact  Cognition:  WNL and Impaired,  Mild appears related to psychosis  Sleep:   >6 hours   Disposition: Patient continues to meet criteria for inpatient psychiatric admission and Sw to seek placement.   Treatment Plan Summary: Plan Psychiatrically clear to follow up with outpatient psychiatric services   Medication management: Continue Abilify Maintena 400 mg Q 28 days Continue Abilify 5 mg daily for 14  days Continue Trazodone 50 mg Q hs prn  for sleep  Patient will need prescription for medications.     Disposition:  Psychiatrically Cleared No evidence of imminent risk to self or others at present.   Patient does not meet criteria for psychiatric inpatient admission. Supportive therapy provided about ongoing stressors. Discussed crisis plan, support from social network, calling 911, coming to the Emergency Department, and calling Suicide Hotline.  Kimberly Martindale, NP 01/13/2020 11:49 AM

## 2020-01-13 NOTE — ED Provider Notes (Addendum)
Emergency Medicine Observation Re-evaluation Note  Kimberly Orozco is a 44 y.o. female, seen on rounds today.  Pt initially presented to the ED for complaints of Medical Clearance (IVCd) Currently, the patient is sitting calmly.  Physical Exam  BP 119/77 (BP Location: Right Arm)   Pulse 79   Temp 97.9 F (36.6 C) (Oral)   Resp 20   LMP 12/30/2019   SpO2 100%  Physical Exam CONSTITUTIONAL: Well-appearing, NAD NEURO: Alert and awake ENT/NECK:  supple, no JVD CARDIO: Regular rate, well-perfused PULM: No increased work of breathing GI/GU: Nondistended MSK/SPINE:  No gross deformities, no edema SKIN:  no rash, atraumatic PSYCH: Deferred  ED Course / MDM  EKG:EKG Interpretation  Date/Time:  Saturday January 10 2020 12:57:41 EDT Ventricular Rate:  86 PR Interval:  134 QRS Duration: 88 QT Interval:  390 QTC Calculation: 466 R Axis:   67 Text Interpretation: Normal sinus rhythm Normal ECG When compared with ECG of 10/08/2019, No significant change was found Confirmed by Dione Booze ([id number]) on 01/11/2020 2:11:32 AM   I have reviewed the labs performed to date as well as medications administered while in observation.  Recent changes in the last 24 hours include none..  Plan  Current plan is for inpatient placement.  May need forced meds patient is not taking meds. Patient is under full IVC at this time.  Update 12:41 PM patient has been psychiatrically cleared, will be discharged with psychiatry follow-up.   Sabas Sous, MD 01/13/20 [id number]    Sabas Sous, MD 01/13/20 1241

## 2020-01-13 NOTE — ED Notes (Signed)
Patient resting quietly/calm

## 2020-01-13 NOTE — ED Notes (Signed)
Patient sitting on side of bed quietly

## 2020-01-13 NOTE — ED Notes (Signed)
Patients breakfast tray delivered 

## 2020-01-14 NOTE — Progress Notes (Signed)
TOC CM/CSW was contacted by Steward Drone Lucus/pts mom in regards to pts appt that we had spoke about on yesterday (01/13/2020).  Steward Drone had misplaced the information given to her yesterday.  CSW repeated the information given about Medicaid transportation intake that needs to be done now, because once accepted pt needs to set up ride 3 days prior to appointment.  This concluded the call.  Kimberly Orozco, MSW, LCSW-A Pronouns:  She, Her, Hers                  [facility] Transitions of CareClinical Social Worker [email or web address] [phone]

## 2020-01-15 ENCOUNTER — Telehealth: Payer: Self-pay | Admitting: *Deleted

## 2020-01-15 ENCOUNTER — Telehealth: Payer: Self-pay

## 2020-01-15 NOTE — Telephone Encounter (Signed)
Patient called requesting call back. She would like to speak about the surgery that she had with Dr. Arita Miss as she has scarring and pain related to the wound. She would like to know why she was not referred to an OB/GYN.
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
## 2020-01-16 NOTE — Telephone Encounter (Signed)
Thanks for the update. Looks like she was IVC'ed recently.

## 2020-01-16 NOTE — Telephone Encounter (Signed)
Returned patients call. Advised her that an OB GYN would not treat her wound due to the location. Offered her to make an appointment for observation with Dr. Arita Miss for the open wound of the right thigh and abdominal. Patient declined appointment and indicated she did not feel well. She will call back when she is feeling better. Patient understood and agreed.

## 2020-01-22 ENCOUNTER — Encounter (HOSPITAL_COMMUNITY): Payer: Self-pay

## 2020-01-22 ENCOUNTER — Other Ambulatory Visit: Payer: Self-pay

## 2020-01-22 ENCOUNTER — Emergency Department (HOSPITAL_COMMUNITY)
Admission: EM | Admit: 2020-01-22 | Discharge: 2020-01-23 | Disposition: A | Discharge status: Home / Self Care | Payer: Medicare Other | Attending: Emergency Medicine | Admitting: Emergency Medicine

## 2020-01-22 DIAGNOSIS — R259 Unspecified abnormal involuntary movements: Secondary | ICD-10-CM | POA: Insufficient documentation

## 2020-01-22 DIAGNOSIS — R000 Tachycardia, unspecified: Secondary | ICD-10-CM | POA: Insufficient documentation

## 2020-01-22 DIAGNOSIS — F209 Schizophrenia, unspecified: Secondary | ICD-10-CM | POA: Diagnosis present

## 2020-01-22 DIAGNOSIS — I10 Essential (primary) hypertension: Secondary | ICD-10-CM | POA: Insufficient documentation

## 2020-01-22 DIAGNOSIS — Z20822 Contact with and (suspected) exposure to covid-19: Secondary | ICD-10-CM | POA: Insufficient documentation

## 2020-01-22 DIAGNOSIS — F259 Schizoaffective disorder, unspecified: Secondary | ICD-10-CM | POA: Insufficient documentation

## 2020-01-22 DIAGNOSIS — F17210 Nicotine dependence, cigarettes, uncomplicated: Secondary | ICD-10-CM | POA: Insufficient documentation

## 2020-01-22 DIAGNOSIS — Z79899 Other long term (current) drug therapy: Secondary | ICD-10-CM | POA: Insufficient documentation

## 2020-01-22 DIAGNOSIS — Z046 Encounter for general psychiatric examination, requested by authority: Secondary | ICD-10-CM

## 2020-01-22 DIAGNOSIS — F200 Paranoid schizophrenia: Secondary | ICD-10-CM | POA: Diagnosis not present

## 2020-01-22 LAB — RESPIRATORY PANEL BY RT PCR (FLU A&B, COVID)
Influenza A by PCR: NEGATIVE
Influenza B by PCR: NEGATIVE
SARS Coronavirus 2 by RT PCR: NEGATIVE

## 2020-01-22 LAB — BASIC METABOLIC PANEL
Anion gap: 11 (ref 5–15)
BUN: 14 mg/dL (ref 6–20)
CO2: 25 mmol/L (ref 22–32)
Calcium: 8.9 mg/dL (ref 8.9–10.3)
Chloride: 101 mmol/L (ref 98–111)
Creatinine, Ser: 0.82 mg/dL (ref 0.44–1.00)
GFR, Estimated: >60 mL/min (ref >60)
Glucose, Bld: 114 mg/dL — ABNORMAL HIGH (ref 70–99)
Potassium: 3.9 mmol/L (ref 3.5–5.1)
Sodium: 137 mmol/L (ref 135–145)

## 2020-01-22 LAB — CBC WITH DIFFERENTIAL/PLATELET
Abs Immature Granulocytes: 0.05 10*3/uL (ref 0.00–0.07)
Basophils Absolute: 0 10*3/uL (ref 0.0–0.1)
Basophils Relative: 0 %
Eosinophils Absolute: 0.1 10*3/uL (ref 0.0–0.5)
Eosinophils Relative: 1 %
HCT: 43.4 % (ref 36.0–46.0)
Hemoglobin: 13.7 g/dL (ref 12.0–15.0)
Immature Granulocytes: 0 %
Lymphocytes Relative: 19 %
Lymphs Abs: 2.4 10*3/uL (ref 0.7–4.0)
MCH: 26.7 pg (ref 26.0–34.0)
MCHC: 31.6 g/dL (ref 30.0–36.0)
MCV: 84.4 fL (ref 80.0–100.0)
Monocytes Absolute: 0.7 10*3/uL (ref 0.1–1.0)
Monocytes Relative: 6 %
Neutro Abs: 9.5 10*3/uL — ABNORMAL HIGH (ref 1.7–7.7)
Neutrophils Relative %: 74 %
Platelets: 364 10*3/uL (ref 150–400)
RBC: 5.14 MIL/uL — ABNORMAL HIGH (ref 3.87–5.11)
RDW: 18 % — ABNORMAL HIGH (ref 11.5–15.5)
WBC: 12.9 10*3/uL — ABNORMAL HIGH (ref 4.0–10.5)
nRBC: 0 % (ref 0.0–0.2)

## 2020-01-22 LAB — ETHANOL: Alcohol, Ethyl (B): <10 mg/dL (ref <10)

## 2020-01-22 LAB — I-STAT BETA HCG BLOOD, ED (MC, WL, AP ONLY): I-stat hCG, quantitative: <5 m[IU]/mL (ref <5)

## 2020-01-22 MED ORDER — ARIPIPRAZOLE 5 MG PO TABS
5.0000 mg | ORAL_TABLET | Freq: Every day | ORAL | Status: DC
  Administered 2020-01-23: 5 mg via ORAL
  Filled 2020-01-22 (×2): qty 1

## 2020-01-22 MED ORDER — ACETAMINOPHEN 500 MG PO TABS
1000.0000 mg | ORAL_TABLET | Freq: Four times a day (QID) | ORAL | Status: DC | PRN
  Administered 2020-01-22 – 2020-01-23 (×2): 1000 mg via ORAL
  Filled 2020-01-22 (×3): qty 2

## 2020-01-22 MED ORDER — CHLORTHALIDONE 25 MG PO TABS
25.0000 mg | ORAL_TABLET | Freq: Every day | ORAL | Status: DC
  Administered 2020-01-23: 25 mg via ORAL
  Filled 2020-01-22: qty 1

## 2020-01-22 MED ORDER — NYSTATIN 100000 UNIT/GM EX CREA
TOPICAL_CREAM | Freq: Two times a day (BID) | CUTANEOUS | Status: DC
  Filled 2020-01-22: qty 15

## 2020-01-22 MED ORDER — TRAZODONE HCL 50 MG PO TABS
50.0000 mg | ORAL_TABLET | Freq: Every day | ORAL | Status: DC
  Administered 2020-01-23: 50 mg via ORAL
  Filled 2020-01-22 (×2): qty 1

## 2020-01-22 NOTE — ED Triage Notes (Signed)
Pt BIB [facility]. Pt is IVC'd. Per IVC paperwork pt has schizophrenia and patient has been talking to herself, laughing, and sitting in the dark. Pt says that she has a demon that follows her and keeps saying she is going to kill her mother and get rid of her grandchildren.
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
## 2020-01-23 DIAGNOSIS — F200 Paranoid schizophrenia: Secondary | ICD-10-CM

## 2020-01-23 DIAGNOSIS — F209 Schizophrenia, unspecified: Secondary | ICD-10-CM | POA: Diagnosis not present

## 2020-01-23 NOTE — ED Notes (Signed)
Pt's mother, Emberlie Gotcher called, [phone], and said that if pt is discharge they need to know because they do not feel safe. She said that if she is discharged they are all going to leave the house.  Berneice Heinrich NP informed about her mother's concerns.

## 2020-01-23 NOTE — ED Notes (Signed)
Pt discharged home. Discharged instructions read to pt who verbalized understanding. All belongings returned to pt. Denies SI/HI, is not delusional and not responding to internal stimuli. Escorted pt to the ED exit.   

## 2020-01-23 NOTE — ED Provider Notes (Signed)
Emergency Medicine Observation Re-evaluation Note  Kimberly Orozco is a 44 y.o. female, seen on rounds today.  Pt initially presented to the ED for complaints of No chief complaint on file. Currently, the patient is resting in no acute distress.  Physical Exam  BP 97/62 (BP Location: Left Arm)   Pulse 81   Temp 98.3 F (36.8 C)   Resp 18   LMP 12/30/2019   SpO2 98%  Physical Exam General: Calm, resting Cardiac: Regular rhythm Lungs: Breathing easily no stridor Psych: Depressed mood  ED Course / MDM  EKG:    I have reviewed the labs performed to date as well as medications administered while in observation.  Recent changes in the last 24 hours include Patient has been assessed by psychiatry.  Plan is for discharge and outpatient management  Plan  Current plan is for discharge and outpatient management.    Linwood Dibbles, MD 01/23/20 1049

## 2020-01-23 NOTE — Discharge Instructions (Signed)
Continue your current medications.  For your ongoing behavioral health needs, you are advised to follow up with [facility].  You are scheduled for an appointment with Toy Cookey, NP on Wednesday, February 25, 2020 at 1:00 pm:       [facility]      [address]      [place], [place] [address]      [phone]
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
## 2020-02-11 ENCOUNTER — Telehealth: Payer: Medicare Other

## 2020-02-12 ENCOUNTER — Ambulatory Visit: Payer: Medicare Other

## 2020-02-12 NOTE — Chronic Care Management (AMB) (Signed)
  Care Management   Social Work Note  02/12/2020 Name: [name] MRN: [id number] DOB: 06-28-1975   BSW asked to assist with completion of [facility] [place] application.   Part A/Patient portion of application was completed by patient but was on old form.  Part A & B of new form completed and placed in [facility] Red Team box for review/signature.   Informed that signed application should be faxed to [employer] [place] Eligibility Department @ [phone]     Malachy Chamber, [place] Embedded Care Coordination Social Worker [facility] [phone]

## 2020-02-24 ENCOUNTER — Other Ambulatory Visit: Payer: Self-pay | Admitting: Internal Medicine

## 2020-02-25 ENCOUNTER — Telehealth (INDEPENDENT_AMBULATORY_CARE_PROVIDER_SITE_OTHER): Payer: Medicare Other | Admitting: Psychiatry

## 2020-02-25 ENCOUNTER — Encounter (HOSPITAL_COMMUNITY): Payer: Self-pay | Admitting: Psychiatry

## 2020-02-25 ENCOUNTER — Other Ambulatory Visit: Payer: Self-pay

## 2020-02-25 DIAGNOSIS — F250 Schizoaffective disorder, bipolar type: Secondary | ICD-10-CM
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
## 2020-02-26 ENCOUNTER — Telehealth (HOSPITAL_COMMUNITY): Payer: Self-pay | Admitting: Student

## 2020-02-26 ENCOUNTER — Other Ambulatory Visit: Payer: Self-pay

## 2020-02-26 NOTE — Telephone Encounter (Signed)
Care Management - Outpatient   Patient requests assistance with housing resources   Patient reports that she lives with her mother and receives a check due to her disability.  Writer provided patient with the following resources:   [facility] - [employer]   To schedule an application appointment please contact our Shelter Department at [phone]. [facility] [address] [place], [place] [address]    [employer][email or web address] [address], [address] [place], [place] [address] [phone]

## 2020-02-26 NOTE — Telephone Encounter (Signed)
Need refill on chlorthalidone (HYGROTON) 25 MG tablet ;pt contact [phone]   [id number] - [place], [place] - [address] AT [facility] [address] & [facility]

## 2020-02-27 MED ORDER — CHLORTHALIDONE 25 MG PO TABS: 25.0000 mg | ORAL_TABLET | Freq: Every day | ORAL | 3 refills | Status: DC

## 2020-03-02 ENCOUNTER — Other Ambulatory Visit: Payer: Self-pay

## 2020-03-02 ENCOUNTER — Telehealth (INDEPENDENT_AMBULATORY_CARE_PROVIDER_SITE_OTHER): Payer: Medicare Other | Admitting: Surgical

## 2020-03-02 ENCOUNTER — Encounter: Payer: Self-pay | Admitting: Surgical

## 2020-03-02 DIAGNOSIS — S31109D Unspecified open wound of abdominal wall, unspecified quadrant without penetration into peritoneal cavity, subsequent encounter: Secondary | ICD-10-CM | POA: Diagnosis not present

## 2020-03-02 DIAGNOSIS — S71101D Unspecified open wound, right thigh, subsequent encounter: Secondary | ICD-10-CM | POA: Diagnosis not present

## 2020-03-02 NOTE — Progress Notes (Signed)
Subjective:     Patient ID: Kimberly Orozco, female    DOB: November 15, 1975, 44 y.o.   MRN: [id number]  Chief Complaint  Patient presents with  . Follow-up    HPI: The patient is a 44 y.o. female here for virtual follow-up after right groin wound.  Patient most recently underwent debridement of right thigh wound with placement of split-thickness skin graft on 10/28/2019 with Dr. Arita Miss.  I last saw her on 12/30/2019 and she was doing well.  At that time she stated that she would like to schedule virtual visits as she has difficulty with transportation to our office.  She was then seen in the emergency room on 01/05/2020 for a wound check as she noticed bleeding from the right groin split-thickness skin graft recipient site.  She reports today on the phone that she presented to the ED on this day due to the bleeding.  She reports today that she is not having any shortness of breath or chest pain or nausea/vomiting/fever/chills.  She reports that she last noticed some bleeding from the wound last week when she had woke up in the middle the night with some blood on her clothes.  She reports that she also has noticed some continued swelling in her leg, reports this is nothing new and has been going on for months.  She also reports some pain with walking.  She reports that occasionally she keeps her leg elevated while at home but not consistently.  She does report that she has noticed improvement in the leg swelling with elevation.  The patient gave consent to have this visit done by telemedicine / virtual visit.  This is also consent for access the chart and treat the patient via this visit. The patient is located at home.  I, the provider, am at the office.  We spent 10 minutes together for the visit.  Joined by telephone.  She reports that she would be okay with an in person visit to further evaluate. She is unsure if she has a wound on her right thigh/groin.   Review of Systems  Constitutional: Negative  for chills, fatigue and fever.  Respiratory: Negative for chest tightness and shortness of breath.   Cardiovascular: Negative for chest pain.  Skin: Negative for wound.    Objective:   Vital Signs There were no vitals taken for this visit. Virtual telemetry visit   Assessment/Plan:     ICD-10-CM   1. Open wound of abdominal wall, subsequent encounter  S31.109D   2. Open thigh wound, right, subsequent encounter  S71.101D     I discussed with the patient that I would like to see her in person for reevaluation in approximately 1 month.  I discussed with the patient that if she notices any changes in her groin or concerning changes that I would be happy to see her sooner.  We discussed wound care recommendations if she notices any changes or new wounds in the meantime.  I discussed with her that I would recommend elevating her leg as able when at home to help decrease swelling in her leg.  I discussed with her that she may benefit from compression stockings as well.  She is not having any infectious symptoms or symptoms of DVT/PE.  Patient is in agreement with plan. She is interested in a follow-up in approximately 1 month to evaluate the right groin split-thickness skin graft recipient site.  Recommend calling with questions or concerns.    Kermit Balo Donterius Filley, PA-C 03/02/2020,  3:00 PM

## 2020-03-03 ENCOUNTER — Telehealth: Payer: Self-pay | Admitting: *Deleted

## 2020-03-03 NOTE — Telephone Encounter (Signed)
Received on (02/26/20) via of fax Physician Orders from [facility] requesting to be signed and faxed back. Given to provider to sign.  Orders where not signed.  Per provider-We do not currently manage any medications for this patient  We have never managed her Schizophrenia medications.  Please reach out to her provider that manages these. -IHK:[id number].  Physician Orders faxed back to [facility].  Confirmation received and copy scanned into the chart.//AB/CMA

## 2020-03-11 ENCOUNTER — Emergency Department (HOSPITAL_COMMUNITY)
Admission: EM | Admit: 2020-03-11 | Discharge: 2020-03-11 | Discharge status: Absent without leave | Payer: Medicare Other

## 2020-04-02 ENCOUNTER — Encounter: Payer: Medicare Other | Admitting: Student

## 2020-04-05 ENCOUNTER — Telehealth: Payer: Self-pay | Admitting: Surgical

## 2020-04-05 NOTE — Telephone Encounter (Signed)
Patient called to speak with [facility] regarding her wound. It's been bleeding and she is headed to the ER. She said it started a couple of days ago. It was worse yesterday than today. Please call her at [phone].

## 2020-04-06 NOTE — Telephone Encounter (Signed)
Please call patient and schedule an in person evaluation.

## 2020-04-07 ENCOUNTER — Telehealth: Payer: Self-pay | Admitting: Surgical

## 2020-04-07 NOTE — Telephone Encounter (Signed)
Offered patient an appointment to be evaluated in person by PA as he requested. Patient could not come tomorrow and said she would call back to schedule the appointment.

## 2020-04-12 ENCOUNTER — Ambulatory Visit (INDEPENDENT_AMBULATORY_CARE_PROVIDER_SITE_OTHER): Payer: Medicare Other | Admitting: Student

## 2020-04-12 ENCOUNTER — Other Ambulatory Visit: Payer: Self-pay

## 2020-04-12 ENCOUNTER — Encounter: Payer: Self-pay | Admitting: Student

## 2020-04-12 DIAGNOSIS — S31109D Unspecified open wound of abdominal wall, unspecified quadrant without penetration into peritoneal cavity, subsequent encounter: Secondary | ICD-10-CM | POA: Diagnosis not present

## 2020-04-12 DIAGNOSIS — I10 Essential (primary) hypertension: Secondary | ICD-10-CM | POA: Diagnosis not present

## 2020-04-12 NOTE — Assessment & Plan Note (Signed)
Patient reported some bleeding from wound last week. She was scheduled to follow up with surgery but was unable to go to appt due to lack of transportation. States her wound is not currently bleeding and she denies any fever or chills. Patient was instructed to call and schedule a follow up with surgery.  --Follow up with surgery --Check wound at next OV

## 2020-04-12 NOTE — Progress Notes (Signed)
   CC: Hypertension follow up  This is a telephone encounter between [employer] and [name] on 04/12/2020 for hypertension follow up. The visit was conducted with the patient located at home and [name] at [facility]. The patient's identity was confirmed using their DOB and current address. The patient has consented to being evaluated through a telephone encounter and understands the associated risks (an examination cannot be done and the patient may need to come in for an appointment) / benefits (allows the patient to remain at home, decreasing exposure to coronavirus). I personally spent 5 minutes on medical discussion.   HPI:  [name] is a 45 y.o. with PMH as below.   Please see A&P for assessment of the patient's acute and chronic medical conditions.   Past Medical History:  Diagnosis Date  . AKI (acute kidney injury) (HCC)   . Bipolar disorder (HCC)   . Hypertension    not on medications  . Schizophrenia (HCC)    Review of Systems:    Denies any fever, chills, blurry vision or pain  Assessment & Plan:   See Encounters Tab for problem based charting.  Patient discussed with Dr. Anthoney Harada, MD, MPH

## 2020-04-12 NOTE — Assessment & Plan Note (Signed)
Patient scheduled for a virtual visit follow up on her hypertension due to lack of transportation. Patient states she has been adherent to her BP med. She does not check her BP at home. States she has no symptoms. Patient was advised to continue taking her BP med and continue lifestyle modification. She was instructed to schedule an in-person appt when she has a reliable transportation.  --Continue Chlorthalidone 25 mg daily --Re-evaluate antihypertensive regimen at next OV

## 2020-04-13 NOTE — Progress Notes (Signed)
Internal Medicine Clinic Attending  Case discussed with Dr. Amponsah  At the time of the visit.  We reviewed the resident's history and exam and pertinent patient test results.  I agree with the assessment, diagnosis, and plan of care documented in the resident's note.  

## 2020-05-11 ENCOUNTER — Telehealth: Payer: Self-pay

## 2020-05-11 NOTE — Telephone Encounter (Signed)
Received TC from patient who c/o intermittent left leg swelling.  She is requesting an appt to come in, but has transportation issues.  Pt states she is currently living in a shelter [facility] , [address] [place]) and [place] is too far for her to walk to catch.  Will forward to front office and practice manager to see if [facility] can offer any assistance with transportation.  Pt needs in-person appt for left leg swelling and due for b/p check.  Pt phone number [phone]. Thank you, SChaplin, RN,BSN

## 2020-05-11 NOTE — Telephone Encounter (Signed)
Received request from FO Staff to contact patient regarding transportation. Patient states she has never used [facility] and once in awhile she can get a family member to take her places. Explained that she can receive [facility] transportation with 3 business days notice. Relayed number for [facility] Case Worker [phone]) to complete Transportation Assessment Form. She will call them now and call back to schedule appt when she has transportation arranged.

## 2020-05-11 NOTE — Telephone Encounter (Signed)
Pt is requesting a call back [phone]

## 2020-05-25 ENCOUNTER — Other Ambulatory Visit: Payer: Self-pay

## 2020-05-25 ENCOUNTER — Encounter (HOSPITAL_COMMUNITY): Payer: Self-pay | Admitting: Psychiatry

## 2020-05-25 ENCOUNTER — Telehealth (INDEPENDENT_AMBULATORY_CARE_PROVIDER_SITE_OTHER): Payer: Medicare Other | Admitting: Psychiatry

## 2020-05-25 DIAGNOSIS — F259 Schizoaffective disorder, unspecified: Secondary | ICD-10-CM | POA: Diagnosis not present

## 2020-05-25 NOTE — Progress Notes (Signed)
BH MD/PA/NP OP Progress Note Virtual Visit via Telephone Note  I connected with Kimberly Orozco on 05/25/20 at  4:00 PM EDT by telephone and verified that I am speaking with the correct person using two identifiers.  Location: Patient: home Provider: Clinic   I discussed the limitations, risks, security and privacy concerns of performing an evaluation and management service by telephone and the availability of in person appointments. I also discussed with the patient that there may be a patient responsible charge related to this service. The patient expressed understanding and agreed to proceed.   I provided 30 minutes of non-face-to-face time during this encounter.   05/25/2020 9:44 AM [name]  MRN:  [id number]  Chief Complaint:  "I am fine I do not need any medications"  HPI: 45 year old female seen today for follow up psychiatric evaluation.  She has a psychiatric history of bipolar disorder, schizoaffective disorder, and schizophrenia.  Currently she is not managed on any medication and notes that she does not want to restart.  Today patient unable to log on virtually so the exam was done over the phone.  During assessment patient was speech was slowed/halted.  Her voice was very low and the patient seemed guarded as she answered questions quickly and with minimal engagement. She informed Clinical research associate that she has been doing well and notes that she is not in need of medications.  Today she denies symptoms of anxiety and depression.   Provider conducted a GAD-7 and a PHQ-9 and patient scored a 0 on both.  She also denies SI/HI/VH or paranoia.    Patient notes that she is currently living in a shelter.  She informed Clinical research associate that she is sleeping and eating well.  At this time patient notes that she does not want to restart medications.  She informed Clinical research associate that she would follow-up as needed.  No other concerns at this time.  Visit Diagnosis: No diagnosis found.  Past Psychiatric  History: Bipolar disorder, schizoaffective disorder, and schizophrenia  Past Medical History:  Past Medical History:  Diagnosis Date  . AKI (acute kidney injury) (HCC)   . Bipolar disorder (HCC)   . Hypertension    not on medications  . Schizophrenia [facility])     Past Surgical History:  Procedure Laterality Date  . APPLICATION OF A-CELL OF EXTREMITY N/A 07/08/2019   Procedure: with placement of primatrix AG;  Surgeon: Allena Napoleon, MD;  Location: [facility] OR;  Service: Plastics;  Laterality: N/A;  . APPLICATION OF A-CELL OF EXTREMITY Right 07/22/2019   Procedure: placement of primatrix mesh;  Surgeon: Allena Napoleon, MD;  Location: [facility] OR;  Service: Plastics;  Laterality: Right;  . INCISION AND DRAINAGE OF WOUND N/A 06/26/2019   Procedure: IRRIGATION AND DEBRIDEMENT ABDOMINAL WALL AND GROIN;  Surgeon: Axel Filler, MD;  Location: [facility] OR;  Service: General;  Laterality: N/A;  . INCISION AND DRAINAGE OF WOUND N/A 07/08/2019   Procedure: Debridement of abdominal and groin wound;  Surgeon: Allena Napoleon, MD;  Location: [facility] OR;  Service: Plastics;  Laterality: N/A;  . INCISION AND DRAINAGE PERIRECTAL ABSCESS Right 06/24/2019   Procedure: IRRIGATION AND DEBRIDEMENT RIGHT GROIN AND RIGHT BUTTOCK;  Surgeon: Axel Filler, MD;  Location: [facility] OR;  Service: General;  Laterality: Right;  . IRRIGATION AND DEBRIDEMENT OF WOUND WITH SPLIT THICKNESS SKIN GRAFT Right 07/22/2019   Procedure: Debridement of right thigh wound;  Surgeon: Allena Napoleon, MD;  Location: [facility] OR;  Service: Plastics;  Laterality: Right;  . IRRIGATION  AND DEBRIDEMENT OF WOUND WITH SPLIT THICKNESS SKIN GRAFT Right 10/28/2019   Procedure: Debridement of right thigh wound with placement of split thickness skin graft;  Surgeon: Allena Napoleon, MD;  Location: [facility] OR;  Service: Plastics;  Laterality: Right;    Family Psychiatric History:  Denies Family History:  Family History  Family history unknown: Yes    Social History:  Social  History   Socioeconomic History  . Marital status: Single    Spouse name: Not on file  . Number of children: Not on file  . Years of education: Not on file  . Highest education level: Not on file  Occupational History  . Not on file  Tobacco Use  . Smoking status: Current Every Day Smoker    Types: Cigarettes  . Smokeless tobacco: Never Used  . Tobacco comment: 4-5 per day   Vaping Use  . Vaping Use: Never used  Substance and Sexual Activity  . Alcohol use: Not Currently  . Drug use: Never  . Sexual activity: Not on file  Other Topics Concern  . Not on file  Social History Narrative  . Not on file   Social Determinants of Health   Financial Resource Strain: Not on file  Food Insecurity: Not on file  Transportation Needs: Not on file  Physical Activity: Not on file  Stress: Not on file  Social Connections: Not on file    Allergies: No Known Allergies  Metabolic Disorder Labs: Lab Results  Component Value Date   HGBA1C 4.7 09/25/2019   MPG 93.93 09/08/2019   MPG 366.58 04/27/2019   No results found for: PROLACTIN No results found for: CHOL, TRIG, HDL, CHOLHDL, VLDL, LDLCALC Lab Results  Component Value Date   TSH 1.504 09/08/2019    Therapeutic Level Labs: No results found for: LITHIUM Lab Results  Component Value Date   VALPROATE <10 (L) 10/03/2019   VALPROATE <10 (L) 06/23/2019   No components found for:  CBMZ  Current Medications: Current Outpatient Medications  Medication Sig Dispense Refill  . acetaminophen (TYLENOL) 500 MG tablet Take 1,000 mg by mouth every 6 (six) hours as needed for moderate pain.    . ARIPiprazole (ABILIFY) 5 MG tablet Take 1 tablet (5 mg total) by mouth daily. 14 tablet 0  . chlorthalidone (HYGROTON) 25 MG tablet Take 1 tablet (25 mg total) by mouth daily. 90 tablet 3  . traZODone (DESYREL) 50 MG tablet Take 1 tablet (50 mg total) by mouth at bedtime. (Patient not taking: Reported on 01/22/2020) 30 tablet 0   No current  facility-administered medications for this visit.     Musculoskeletal: Strength & Muscle Tone: Unable to assess due to telephone visit Gait & Station: Unable to assess due to telephone visit Patient leans: N/A  Psychiatric Specialty Exam: Review of Systems  There were no vitals taken for this visit.There is no height or weight on file to calculate BMI.  General Appearance: Unable to assess due to telephone visit  Eye Contact:  Unable to assess due to telephone visit  Speech:  Clear and Coherent, Slow and Slurred  Volume:  Decreased  Mood:  Euthymic  Affect:  Unable to assess due to telephone visit  Thought Process:  Coherent, Goal Directed and Linear  Orientation:  Full (Time, Place, and Person)  Thought Content: WDL and Logical   Suicidal Thoughts:  No  Homicidal Thoughts:  No  Memory:  Immediate;   Good Recent;   Good Remote;   Good  Judgement:  Good  Insight:  Good  Psychomotor Activity:  Unable to assess due to telephone visit  Concentration:  Concentration: Good and Attention Span: Good  Recall:  Good  Fund of Knowledge: Good  Language: Good  Akathisia:  No  Handed:  Right  AIMS (if indicated): Not done  Assets:  Communication Skills Desire for Improvement Housing Social Support  ADL's:  Intact  Cognition: WNL  Sleep:  Good   Screenings: AIMS   Flowsheet Row ED to [facility]  AIMS Total Score 0    AUDIT   Flowsheet Row ED to [facility]osp-Ad[facility] (Discharged[facility]  Alcohol Use Disorder Identification Test Final Score (AUDIT) 1    GAD-7   Flowsheet Row Video Visit from 02/25/2020 in [facility]  Total GAD-7 Score 0    PHQ2-9   Flowsheet Row Office Visit from 04/12/2020 in [facility] Video Visit from 02/25/2020 in [facility] Office Visit from  01/02/2020 in [facility] ED from 10/08/2019 [facility] Office Visit from 09/25/2019 in [facility]  PHQ-2 Total Score 0 0 0 2 3  PHQ-9 Total Score 0 0 0 7 12    Flowsheet Row ED to [facility]osp-Ad[facility]Discharged[facility]  C-SSRS RISK CATEGORY No Risk       Assessment and Plan: Patient notes that she is doing well.  At this time she reports that she does not want to restart medications.  She will follow-up as needed with provider.  1. Schizoaffective disorder, in remission [facility])  Follow-up as needed    Shanna Cisco, NP 05/25/2020, 9:44 AM

## 2020-06-14 ENCOUNTER — Telehealth: Payer: Self-pay

## 2020-06-14 NOTE — Telephone Encounter (Signed)
RTC, no answer, no VM set up for nurse to leave message. SChaplin, RN,BSN

## 2020-06-14 NOTE — Telephone Encounter (Signed)
Patient called back. States she needs our fax number so her Case Worker can fax over the [facility] form for more information. This was given to patient. She was very Adult nurse.

## 2020-06-14 NOTE — Telephone Encounter (Signed)
Pt is requesting a call back  She stated that she spoke to her case manger for [facility]   Transportation who needs more info

## 2020-07-21 ENCOUNTER — Encounter: Payer: Self-pay | Admitting: Internal Medicine

## 2020-07-21 ENCOUNTER — Ambulatory Visit (INDEPENDENT_AMBULATORY_CARE_PROVIDER_SITE_OTHER): Payer: Medicare Other | Admitting: Student

## 2020-07-21 ENCOUNTER — Other Ambulatory Visit: Payer: Self-pay

## 2020-07-21 VITALS — BP 153/91 | HR 97 | Temp 98.7°F | Ht 66.0 in | Wt 262.9 lb

## 2020-07-21 DIAGNOSIS — F200 Paranoid schizophrenia: Secondary | ICD-10-CM | POA: Diagnosis not present

## 2020-07-21 DIAGNOSIS — F259 Schizoaffective disorder, unspecified: Secondary | ICD-10-CM

## 2020-07-21 DIAGNOSIS — F312 Bipolar disorder, current episode manic severe with psychotic features: Secondary | ICD-10-CM

## 2020-07-21 DIAGNOSIS — Z0000 Encounter for general adult medical examination without abnormal findings: Secondary | ICD-10-CM

## 2020-07-21 NOTE — Assessment & Plan Note (Signed)
Assessment: Patient endorses longstanding history of schizoaffective disorder, not currently being treated with medication.  She notes that in the past she has been seen by [facility] for treatment.  She denies needing medications at this time.  When asked if a medical proximal stop this medication she says no stop them because Needs him.  I asked if she would like to be referred to a psychiatrist she denies this time.  I recommended that she reach out to [facility] and become reestablished.  Patient acknowledges understanding.   Plan: -Encourage patient to reach back out follow-up with [facility].

## 2020-07-21 NOTE — Assessment & Plan Note (Addendum)
Assessment: Presents today as she received a letter for jury duty.  She feels as though she is unfit and unable to attend this.  For she states that she has lack of transportation to get to and from the court house.  She also notes that she has a history of right thigh pain severe arthritis that has led to her to be able to sit still for more than 15 minutes.  Also with her history of schizophrenia as well as bipolar disorder that is currently not medicated, I do not believe that she is fit for jury duty.  I have written a form stating the above. Patient also endorses living at a shelter.  She states she feels safe at the shelter.  Because of her current homelessness as well as multiple social difficulties, will place order for community care coordination.  Also encourage patient to schedule appointment soon to have annual exam and laboratory data checked.  She states that she will schedule appointment with front desk.  Chronic care management also we would help with transportation to and from appointments with our office.  Plan: -Letter given to patient stating that she is unfit for jury duty at this time secondary to her history of mental health disease well as physical disabilities. -Order placed for chronic care management

## 2020-07-21 NOTE — Progress Notes (Signed)
   CC: History of schizophrenia, bipolar disorder, hip pain  HPI:  [name] is a 45 y.o. female with a past medical history stated below and presents today for to discuss jury duty.  Please see problem based assessment and plan for additional details.  Past Medical History:  Diagnosis Date  . AKI (acute kidney injury) (HCC)   . Bipolar disorder (HCC)   . Hypertension    not on medications  . Schizophrenia [facility])     Current Outpatient Medications on File Prior to Visit  Medication Sig Dispense Refill  . acetaminophen (TYLENOL) 500 MG tablet Take 1,000 mg by mouth every 6 (six) hours as needed for moderate pain.    . ARIPiprazole (ABILIFY) 5 MG tablet Take 1 tablet (5 mg total) by mouth daily. 14 tablet 0  . chlorthalidone (HYGROTON) 25 MG tablet Take 1 tablet (25 mg total) by mouth daily. 90 tablet 3  . traZODone (DESYREL) 50 MG tablet Take 1 tablet (50 mg total) by mouth at bedtime. (Patient not taking: Reported on 01/22/2020) 30 tablet 0   No current facility-administered medications on file prior to visit.    Family History  Family history unknown: Yes    Social History   Socioeconomic History  . Marital status: Single    Spouse name: Not on file  . Number of children: Not on file  . Years of education: Not on file  . Highest education level: Not on file  Occupational History  . Not on file  Tobacco Use  . Smoking status: Current Every Day Smoker    Types: Cigarettes  . Smokeless tobacco: Never Used  . Tobacco comment: 4-5 per day   Vaping Use  . Vaping Use: Never used  Substance and Sexual Activity  . Alcohol use: Not Currently  . Drug use: Never  . Sexual activity: Not on file  Other Topics Concern  . Not on file  Social History Narrative  . Not on file   Social Determinants of Health   Financial Resource Strain: Not on file  Food Insecurity: Not on file  Transportation Needs: Not on file  Physical Activity: Not on file  Stress: Not on file   Social Connections: Not on file  Intimate Partner Violence: Not on file    Review of Systems: ROS negative except for what is noted on the assessment and plan.  Vitals:   07/21/20 1430  BP: (!) 153/91  Pulse: 97  Temp: 98.7 F (37.1 C)  TempSrc: Oral  SpO2: 96%  Weight: 262 lb 14.4 oz (119.3 kg)  Height: 5\' 6"  (1.676 m)     Physical Exam: Constitutional: Well-appearing in no acute distress HENT: normocephalic atraumatic Eyes: conjunctiva non-erythematous Neck: supple Cardiovascular: regular rate Pulmonary/Chest: normal work of breathing on room air MSK: normal bulk and tone Neurological: alert & oriented x 3 Skin: warm and dry Psych: Flat affect   Assessment & Plan:   See Encounters Tab for problem based charting.  Patient discussed with Dr. , D.O. [facility] Internal Medicine, PGY-1 Pager: [phone], Phone: [phone] Date 07/21/2020 Time 6:31 PM

## 2020-07-21 NOTE — Patient Instructions (Signed)
Thank you, [name] for allowing [place] to provide your care today. Today we discussed jury duty excuse  .    I have ordered the following labs for you:  Lab Orders  No laboratory test(s) ordered today    Referrals ordered today:   Referral Orders  No referral(s) requested today     I have ordered the following medication/changed the following medications:   Stop the following medications: There are no discontinued medications.   Start the following medications: No orders of the defined types were placed in this encounter.    Follow up: 1-2 months for routine exam   Should you have any questions or concerns please call the internal medicine clinic at [phone].     Thalia Bloodgood, D.O. [facility]

## 2020-07-23 ENCOUNTER — Telehealth: Payer: Self-pay | Admitting: *Deleted

## 2020-07-23 NOTE — Progress Notes (Signed)
Internal Medicine Clinic Attending  Case discussed with Dr. Katsadouros  At the time of the visit.  We reviewed the resident's history and exam and pertinent patient test results.  I agree with the assessment, diagnosis, and plan of care documented in the resident's note.  

## 2020-07-23 NOTE — Chronic Care Management (AMB) (Signed)
  Care Management   Outreach Note  07/23/2020 Name: Kimberly Orozco MRN: [id number] DOB: [birth year]  Referred by: Dellia Cloud, MD Reason for referral : Care Coordination (Initial outreach to schedule referral with BSW was unsuccessful)   An unsuccessful telephone outreach was attempted today. The patient was referred to the case management team for assistance with care management and care coordination.   Follow Up Plan: A HIPAA compliant phone message was left for the patient providing contact information and requesting a return call. The care management team will reach out to the patient again over the next 7 days.  If patient returns call to provider office, please advise to call Embedded Care Management Care Guide Kimberly Orozco at [phone].  Kimberly Orozco  Care Guide, Embedded Care Coordination [facility] Management

## 2020-07-26 NOTE — Chronic Care Management (AMB) (Signed)
  Care Management   Outreach Note  07/26/2020 Name: Kimberly Orozco MRN: [id number] DOB: Aug 19, 1975  Referred by: Dellia Cloud, MD Reason for referral : Care Coordination (Initial outreach to schedule referral with BSW was unsuccessful)   A second unsuccessful telephone outreach was attempted today. The patient was referred to the case management team for assistance with care management and care coordination.   Follow Up Plan: A HIPAA compliant phone message was left for the patient providing contact information and requesting a return call. The care management team will reach out to the patient again over the next 7 days.  If patient returns call to provider office, please advise to call Embedded Care Management Care Guide Gwenevere Ghazi at [phone].  Gwenevere Ghazi  Care Guide, Embedded Care Coordination [facility] Management

## 2020-07-26 NOTE — Chronic Care Management (AMB) (Signed)
  Care Management   Note  07/26/2020 Name: Kimberly Orozco MRN: [id number] DOB: 1975-05-28  Kimberly Orozco is a 44 y.o. year old female who is a primary care patient of Dellia Cloud, MD. I reached out to [name] by phone today in response to a referral sent by Kimberly Orozco PCP, Dellia Cloud, MD.    Kimberly Orozco was given information about care management services today including:  1. Care management services include personalized support from designated clinical staff supervised by her physician, including individualized plan of care and coordination with other care providers 2. 24/7 contact phone numbers for assistance for urgent and routine care needs. 3. The patient may stop care management services at any time by phone call to the office staff.  Patient agreed to services and verbal consent obtained.   Follow up plan: Telephone appointment with care management team member scheduled for:08/06/2020  [facility] Guide, Embedded Care Coordination [facility]  Care Management  Direct Dial: [phone]

## 2020-07-30 ENCOUNTER — Encounter: Payer: Self-pay | Admitting: *Deleted

## 2020-07-30 NOTE — Progress Notes (Signed)

## 2020-08-02 NOTE — Progress Notes (Signed)
Things That May Be Affecting Your Health:  Alcohol  Hearing loss  Pain    Depression  Home Safety  Sexual Health  x Diabetes  Lack of physical activity  Stress   Difficulty with daily activities  Loneliness  Tiredness   Drug use x Medicines x Tobacco use   Falls  Motor Vehicle Safety  Weight   Food choices  Oral Health  Other    YOUR PERSONALIZED HEALTH PLAN : 1. Schedule your next subsequent Medicare Wellness visit in one year 2. Attend all of your regular appointments to address your medical issues 3. Complete the preventative screenings and services   Annual Wellness Visit   Medicare Covered Preventative Screenings and [place] Men and Women Who How Often Need? Date of Last Service Action  Abdominal Aortic Aneurysm Adults with AAA risk factors Once      Alcohol Misuse and Counseling All Adults Screening once a year if no alcohol misuse. Counseling up to 4 face to face sessions.     Bone Density Measurement  Adults at risk for osteoporosis Once every 2 yrs      Lipid Panel Z13.6 All adults without CV disease Once every 5 yrs       Colorectal Cancer   Stool sample or  Colonoscopy All adults 63 and older   Once every year  Every 10 years x       Depression All Adults Once a year  Today   Diabetes Screening Blood glucose, post glucose load, or GTT Z13.1  All adults at risk  Pre-diabetics  Once per year  Twice per year      Diabetes  Self-Management Training All adults Diabetics 10 hrs first year; 2 hours subsequent years. Requires Copay     Glaucoma  Diabetics  Family history of glaucoma  African Americans 18 yrs +  Hispanic Americans 74 yrs + Annually - requires coppay      Hepatitis C Z72.89 or F19.20  High Risk for HCV  Born between 1945 and 1965  Annually  Once      HIV Z11.4 All adults based on risk  Annually btw ages 65 & 20 regardless of risk  Annually > 65 yrs if at increased risk      Lung Cancer Screening  Asymptomatic adults aged 7-77 with 30 pack yr history and current smoker OR quit within the last 15 yrs Annually Must have counseling and shared decision making documentation before first screen      Medical Nutrition Therapy Adults with   Diabetes  Renal disease  Kidney transplant within past 3 yrs 3 hours first year; 2 hours subsequent years     Obesity and Counseling All adults Screening once a year Counseling if BMI 30 or higher  Today   Tobacco Use Counseling Adults who use tobacco  Up to 8 visits in one year x    Vaccines Z23  Hepatitis B  Influenza   Pneumonia  Adults   Once  Once every flu season  Two different vaccines separated by one year x    Next Annual Wellness Visit People with Medicare Every year  Today     Services & Screenings Women Who How Often Need  Date of Last Service Action  Mammogram  Z12.31 Women over 84 One baseline ages 90-39. Annually ager 40 yrs+      Pap tests All women Annually if high risk. Every 2 yrs for normal risk women  Screening for cervical cancer with   Pap (Z01.419 nl or Z01.411abnl) &  HPV Z11.51 Women aged 31 to 54 Once every 5 yrs x    Screening pelvic and breast exams All women Annually if high risk. Every 2 yrs for normal risk women     Sexually Transmitted Diseases  Chlamydia  Gonorrhea  Syphilis All at risk adults Annually for non pregnant females at increased risk         Services & Screenings Men Who How Ofter Need  Date of Last Service Action  Prostate Cancer - DRE & PSA Men over 50 Annually.  DRE might require a copay.        Sexually Transmitted Diseases  Syphilis All at risk adults Annually for men at increased risk      Health Maintenance List Health Maintenance  Topic Date Due  . PNEUMOCOCCAL POLYSACCHARIDE VACCINE AGE 65-64 HIGH RISK  Never done  . COVID-19 Vaccine (1) Never done  . FOOT EXAM  Never done  . OPHTHALMOLOGY EXAM  Never done  . TETANUS/TDAP  Never done  . PAP  SMEAR-Modifier  Never done  . COLONOSCOPY (Pts 45-30yrs Insurance coverage will need to be confirmed)  Never done  . HEMOGLOBIN A1C  03/27/2020  . URINE MICROALBUMIN  09/24/2020  . INFLUENZA VACCINE  10/11/2020  . Hepatitis C Screening  Completed  . HIV Screening  Completed  . HPV VACCINES  Aged Out

## 2020-08-06 ENCOUNTER — Ambulatory Visit: Payer: Medicare Other | Admitting: Licensed Clinical Social Worker

## 2020-08-06 NOTE — Chronic Care Management (AMB) (Signed)
  Care Management   Social Work Visit Note  08/06/2020 Name: Kimberly Orozco MRN: [id number] DOB: 1975/03/23  Kimberly Orozco is a 45 y.o. year old female who sees Dellia Cloud, MD for primary care. The care management team was consulted for assistance with care management and care coordination needs related to [facility] Resources    Patient was given the following information about care management and care coordination services today, agreed to services, and gave verbal consent: 1.care management/care coordination services include personalized support from designated clinical staff supervised by their physician, including individualized plan of care and coordination with other care providers 2. 24/7 contact phone numbers for assistance for urgent and routine care needs. 3. The patient may stop care management/care coordination services at any time by phone call to the office staff.  Engaged with patient by telephone for initial visit in response to provider referral for social work chronic care management and care coordination services.  Assessment: Review of patient history, allergies, and health status during evaluation of patient need for care management/care coordination services.    Interventions:  . Patient interviewed and appropriate assessments performed . Collaborated with clinical team regarding patient needs  . Patient stated she is living at [facility] in [place], [place]. Patient stated the shelter will only allow her a couple more weeks to stay. Patient stated she has a family including a mother that resides in [place], [place]. Patient stated her family assists with transportation, but she is unable to reside with them. Patient was denied section 8 housing. Patient is single with no kids and denied by other shelters. Patient receives $900 in SSI. Patient uses funds for food and transportation. Patient has a pending SCAT application. Patient denied wanting to ride the bus.  Patient stated she has resources and funds to purchase food. Patient complains of her leg swelling but denies needing medical care or wanting to speak with a medical provider. SW offered to have a nurse call. Patient declined.  Patient stated she would call 911 if her leg gets worse.   SDOH (Social Determinants of Health) assessments performed: Yes     Plan:  . patient will work with BSW to address needs related to Housing barriers . Patient will continue looking for shelters or rental properties. SW will look into additional housing resources. SW will follow up in 30 days.Patient will contact PCP if leg continues to swell. Christen Butter, BSW  Social Worker [facility] Care Management  [phone]

## 2020-08-06 NOTE — Patient Instructions (Signed)
Visit Information  Instructions: patient will work with SW to address concerns related to Housing   Patient was given the following information about care management and care coordination services today, agreed to services, and gave verbal consent: 1.care management/care coordination services include personalized support from designated clinical staff supervised by their physician, including individualized plan of care and coordination with other care providers 2. 24/7 contact phone numbers for assistance for urgent and routine care needs. 3. The patient may stop care management/care coordination services at any time by phone call to the office staff.  Patient verbalizes understanding of instructions provided today and agrees to view in MyChart.   Telephone follow up appointment with care management team member scheduled for: 30 days  Christen Butter, [place]  Social Worker [facility] Care Management  [phone]

## 2020-08-18 ENCOUNTER — Encounter: Payer: Self-pay | Admitting: *Deleted

## 2020-08-18 NOTE — Congregational Nurse Program (Signed)
Pt is very apprehensive. She c/o Right above knee pain and swelling, she is cautious of RN touching her, I did try to persuade her to allow me to make an appt w/ her pcp, she refuses due to cost, I explained that w/ medicaid/ medicare she should not have a large bill but she continues to refuse. She refuses VS check and only allows RN to touch her thigh for a few seconds, it is tender to touch, she winces when nurse touches the thigh, swelling is very appr 1 1/2 times larger than Left. Mod firm compared to Left being softer

## 2020-08-25 ENCOUNTER — Encounter: Payer: Self-pay | Admitting: Critical Care Medicine

## 2020-08-25 ENCOUNTER — Other Ambulatory Visit: Payer: Self-pay | Admitting: Critical Care Medicine

## 2020-08-25 ENCOUNTER — Other Ambulatory Visit (HOSPITAL_COMMUNITY): Payer: Self-pay

## 2020-08-25 ENCOUNTER — Encounter: Payer: Self-pay | Admitting: *Deleted

## 2020-08-25 MED ORDER — CHLORTHALIDONE 25 MG PO TABS
25.0000 mg | ORAL_TABLET | Freq: Every day | ORAL | 3 refills | Status: AC
  Filled 2020-08-25: qty 90, 90d supply, fill #0

## 2020-08-25 NOTE — Congregational Nurse Program (Signed)
  Dept: [phone]   Congregational Nurse Program Note  Date of Encounter: 08/25/2020  Past Medical History: Past Medical History:  Diagnosis Date   AKI (acute kidney injury) (HCC)    Bipolar disorder (HCC)    Hypertension    not on medications   Schizophrenia (HCC)     Encounter Details:

## 2020-08-26 ENCOUNTER — Other Ambulatory Visit: Payer: Self-pay | Admitting: *Deleted

## 2020-08-26 NOTE — Progress Notes (Signed)
Patient ID: Kimberly Orozco, female   DOB: 08-05-1975, 45 y.o.   MRN: [id number] Is a 45 year old female seen in the [place] shelter clinic and she just arrived to the shelter on 6 June.  Previous to this she was in the [facility].  She states that she transferred to the shelter because of her insurance status and financial status she no longer qualified to stay at the [facility].  She does have dual insurance with Medicare and Medicaid.  Prior diagnosis of schizoaffective disorder and bipolar disorder hypertension which also shows a diagnosis of schizophrenia paranoid on her chart this appears to been made during an acute crisis with her bipolar disorder in which she had psychotic thinking during admission to the hospital in [place] several years ago.  Since moving to the [place] she has been followed in the internal medicine clinic in the schizophrenic diagnosis and never been resolved.  Note she saw mental health recently and they did change her schizoaffective disorder to be in remission and that she had bipolar disorder.  Patient states she has been on Abilify in the past but did not tolerate this and it did not make any difference in her thinking.  She also was on trazodone did not help her with sleeping.  She states since moving to the shelter she is sleeping better at night and does not request anything for sleeping.  She does complain of some neck pain and back pain after having surgery in the past.  She states is tolerable at this time she is smoking about 5 to 6 cigarettes a day.  I engaged with this patient to see if she would have an interest in coming to health and wellness to establish for primary care or stay with internal medicine I encouraged she should stay with her primary care provider if at all possible since they know her well.  She is taking this under advisement.  On exam this is an obese female in no acute distress blood pressure was 133/86 and again she is off all  medications.  Chest and heart exam unremarkable skin exam is normal   Impression is that of hypertension not treated at this time  The patient did agree to restarting her chlorthalidone we wrote a prescription for 25 mg daily she will receive this from the [facility] outpatient pharmacy.  She also will take Tylenol or ibuprofen for neck and back pain. We did give the patient some personal hygiene material and some Tylenol to take for her neck pain.

## 2020-09-15 ENCOUNTER — Encounter: Payer: Self-pay | Admitting: Critical Care Medicine

## 2020-09-15 ENCOUNTER — Other Ambulatory Visit (HOSPITAL_COMMUNITY): Payer: Self-pay

## 2020-09-15 ENCOUNTER — Other Ambulatory Visit: Payer: Self-pay | Admitting: Critical Care Medicine

## 2020-09-15 MED ORDER — BENZOYL PEROXIDE-ERYTHROMYCIN 5-3 % EX GEL
1.0000 "application " | Freq: Two times a day (BID) | CUTANEOUS | 0 refills | Status: AC
  Filled 2020-09-15: qty 23.3, 12d supply, fill #0

## 2020-09-15 MED ORDER — AMPICILLIN 500 MG PO CAPS
500.0000 mg | ORAL_CAPSULE | Freq: Four times a day (QID) | ORAL | 0 refills | Status: AC
  Filled 2020-09-15: qty 16, 4d supply, fill #0

## 2020-09-16 ENCOUNTER — Other Ambulatory Visit (HOSPITAL_COMMUNITY): Payer: Self-pay

## 2020-09-16 NOTE — Progress Notes (Signed)
Patient ID: Kimberly Orozco, female   DOB: 04-05-1975, 45 y.o.   MRN: [id number] This patient is seen in the [place] shelter clinic is a 45 year old female.  Plan also is to apply sunscreen moisturizer to the face and apply and also prescribed BenzaClin's cream to the face and a short course of ampicillin 500 mg 4 times daily prescription sent to the [facility] outpatient pharmacy in the internal medicine clinic history of hypertension bipolar disorder  Patient complains of increased swelling in the right lower extremity while she has been having to stand up outside in the heat as were not a current heat wave.  Patient also developed sunburn on the face with a rash and purulence on the cheeks.  On exam blood pressure 118/84 pulse 83 saturation 97% room air the right leg has some tenderness in the thigh area but is not overtly swollen compared to the left leg the face is examined and she has folliculitis with purulence and some of the follicles on the cheek and overt sunburn and dry skin  Plan is to allow her to stay indoors during the heat of the day and allow her right leg to be elevated in the waiting room altered

## 2020-10-27 ENCOUNTER — Ambulatory Visit (INDEPENDENT_AMBULATORY_CARE_PROVIDER_SITE_OTHER)
Admission: EM | Admit: 2020-10-27 | Discharge: 2020-10-27 | Disposition: A | Discharge status: Other Acute Inpatient Hospital | Payer: Medicare Other | Source: Home / Self Care

## 2020-10-27 ENCOUNTER — Other Ambulatory Visit: Payer: Self-pay

## 2020-10-27 ENCOUNTER — Encounter (HOSPITAL_COMMUNITY): Payer: Self-pay

## 2020-10-27 ENCOUNTER — Emergency Department (HOSPITAL_COMMUNITY)
Admission: EM | Admit: 2020-10-27 | Discharge: 2020-10-29 | Disposition: A | Discharge status: Rehabilitation Facility | Payer: Medicare Other | Attending: Emergency Medicine | Admitting: Emergency Medicine

## 2020-10-27 DIAGNOSIS — F201 Disorganized schizophrenia: Secondary | ICD-10-CM | POA: Insufficient documentation

## 2020-10-27 DIAGNOSIS — F17210 Nicotine dependence, cigarettes, uncomplicated: Secondary | ICD-10-CM | POA: Insufficient documentation

## 2020-10-27 DIAGNOSIS — Z20822 Contact with and (suspected) exposure to covid-19: Secondary | ICD-10-CM | POA: Insufficient documentation

## 2020-10-27 DIAGNOSIS — R4689 Other symptoms and signs involving appearance and behavior: Secondary | ICD-10-CM | POA: Diagnosis not present

## 2020-10-27 DIAGNOSIS — F209 Schizophrenia, unspecified: Secondary | ICD-10-CM | POA: Insufficient documentation

## 2020-10-27 DIAGNOSIS — Z046 Encounter for general psychiatric examination, requested by authority: Secondary | ICD-10-CM | POA: Insufficient documentation

## 2020-10-27 DIAGNOSIS — Z79899 Other long term (current) drug therapy: Secondary | ICD-10-CM | POA: Insufficient documentation

## 2020-10-27 DIAGNOSIS — I10 Essential (primary) hypertension: Secondary | ICD-10-CM | POA: Insufficient documentation

## 2020-10-27 DIAGNOSIS — F259 Schizoaffective disorder, unspecified: Secondary | ICD-10-CM | POA: Diagnosis present

## 2020-10-27 DIAGNOSIS — F23 Brief psychotic disorder: Secondary | ICD-10-CM

## 2020-10-27 DIAGNOSIS — R451 Restlessness and agitation: Secondary | ICD-10-CM | POA: Diagnosis present

## 2020-10-27 LAB — ACETAMINOPHEN LEVEL: Acetaminophen (Tylenol), Serum: <10 ug/mL — ABNORMAL LOW (ref 10–30)

## 2020-10-27 LAB — RAPID URINE DRUG SCREEN, HOSP PERFORMED
Amphetamines: NOT DETECTED
Barbiturates: NOT DETECTED
Benzodiazepines: NOT DETECTED
Cocaine: NOT DETECTED
Opiates: NOT DETECTED
Tetrahydrocannabinol: NOT DETECTED

## 2020-10-27 LAB — COMPREHENSIVE METABOLIC PANEL
ALT: 19 U/L (ref 0–44)
AST: 22 U/L (ref 15–41)
Albumin: 3.3 g/dL — ABNORMAL LOW (ref 3.5–5.0)
Alkaline Phosphatase: 72 U/L (ref 38–126)
Anion gap: 11 (ref 5–15)
BUN: 16 mg/dL (ref 6–20)
CO2: 21 mmol/L — ABNORMAL LOW (ref 22–32)
Calcium: 8.7 mg/dL — ABNORMAL LOW (ref 8.9–10.3)
Chloride: 104 mmol/L (ref 98–111)
Creatinine, Ser: 0.87 mg/dL (ref 0.44–1.00)
GFR, Estimated: >60 mL/min (ref >60)
Glucose, Bld: 114 mg/dL — ABNORMAL HIGH (ref 70–99)
Potassium: 3.2 mmol/L — ABNORMAL LOW (ref 3.5–5.1)
Sodium: 136 mmol/L (ref 135–145)
Total Bilirubin: 0.8 mg/dL (ref 0.3–1.2)
Total Protein: 7.1 g/dL (ref 6.5–8.1)

## 2020-10-27 LAB — CBC WITH DIFFERENTIAL/PLATELET
Abs Immature Granulocytes: 0.03 10*3/uL (ref 0.00–0.07)
Basophils Absolute: 0.1 10*3/uL (ref 0.0–0.1)
Basophils Relative: 1 %
Eosinophils Absolute: 0.2 10*3/uL (ref 0.0–0.5)
Eosinophils Relative: 1 %
HCT: 46.3 % — ABNORMAL HIGH (ref 36.0–46.0)
Hemoglobin: 14.7 g/dL (ref 12.0–15.0)
Immature Granulocytes: 0 %
Lymphocytes Relative: 26 %
Lymphs Abs: 2.8 10*3/uL (ref 0.7–4.0)
MCH: 28.1 pg (ref 26.0–34.0)
MCHC: 31.7 g/dL (ref 30.0–36.0)
MCV: 88.5 fL (ref 80.0–100.0)
Monocytes Absolute: 0.8 10*3/uL (ref 0.1–1.0)
Monocytes Relative: 7 %
Neutro Abs: 7.2 10*3/uL (ref 1.7–7.7)
Neutrophils Relative %: 65 %
Platelets: 261 10*3/uL (ref 150–400)
RBC: 5.23 MIL/uL — ABNORMAL HIGH (ref 3.87–5.11)
RDW: 14.6 % (ref 11.5–15.5)
WBC: 11 10*3/uL — ABNORMAL HIGH (ref 4.0–10.5)
nRBC: 0 % (ref 0.0–0.2)

## 2020-10-27 LAB — RESP PANEL BY RT-PCR (FLU A&B, COVID) ARPGX2
Influenza A by PCR: NEGATIVE
Influenza B by PCR: NEGATIVE
SARS Coronavirus 2 by RT PCR: NEGATIVE

## 2020-10-27 LAB — ETHANOL: Alcohol, Ethyl (B): <10 mg/dL (ref <10)

## 2020-10-27 LAB — I-STAT BETA HCG BLOOD, ED (MC, WL, AP ONLY): I-stat hCG, quantitative: <5 m[IU]/mL (ref <5)

## 2020-10-27 LAB — SALICYLATE LEVEL: Salicylate Lvl: <7 mg/dL — ABNORMAL LOW (ref 7.0–30.0)

## 2020-10-27 MED ORDER — LORAZEPAM 1 MG PO TABS
2.0000 mg | ORAL_TABLET | Freq: Once | ORAL | Status: AC
  Administered 2020-10-27: 2 mg via ORAL
  Filled 2020-10-27: qty 2

## 2020-10-27 MED ORDER — OLANZAPINE 10 MG PO TABS
ORAL_TABLET | ORAL | Status: AC
  Administered 2020-10-27: 10 mg via ORAL
  Filled 2020-10-27: qty 1

## 2020-10-27 MED ORDER — OLANZAPINE 10 MG PO TBDP: 10.0000 mg | ORAL_TABLET | Freq: Three times a day (TID) | ORAL | Status: DC | PRN

## 2020-10-27 NOTE — ED Notes (Signed)
Pt currently speaking with MHT  and eating a sandwich.  Pt continue to fixate on her car.  No distress noted at this time.  Will continue to monitor for safety.

## 2020-10-27 NOTE — ED Notes (Addendum)
10mg  zyprexa tablet given per physician's orders. Pt very agitated and hyper focused on her family and car, difficult to redirect.  Rapid speech noted. Will continue to monitor for adverse reactions
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
## 2020-10-27 NOTE — ED Provider Notes (Signed)
Patient requires inpatient psychiatric treatment, not appropriate for [facility] behavioral health facility based crisis related to elevated mood and symptoms of psychosis.  She has been involuntarily committed by [facility] provider.  health is currently at capacity, per [facility].  Patient will be transported to [facility] emergency department to await inpatient psychiatric treatment, spoke with attending MD, Dr. Anitra Lauth, who agrees to accept patient.

## 2020-10-27 NOTE — ED Notes (Signed)
Patient refused labs

## 2020-10-27 NOTE — ED Triage Notes (Signed)
Pt bib police from [facility] under IVC due to aggression and paranoia.

## 2020-10-27 NOTE — BH Assessment (Addendum)
Disposition:   Dr. Hazle Quant recommends in patient treatment.  IVC petitioned by Dr. Hazle Quant. Per [facility]. Patient transferred to [facility] from the [facility],  no appropriate [facility] beds available per [facility] Rayfield Citizen, RN), patient faxed out to multiple facilities for consideration of bed placement.  Please see hospitals listed below.   [facility]  Pending - Request Sent N/A [address], [place] [address] [phone] --  [facility]  Pending - Request Sent N/A [address], [place] [address] [phone] --  [facility]  Pending - Request Sent N/A [address] [place] [address] [phone] --  [facility]  Pending - Request Sent N/A [address] [place] [address] [phone] --  [facility]  Pending - Request Sent N/A [address] [place] [address] [phone] --  [facility]  Pending - Request Sent N/A [phone] [place] [address] [phone] --  [facility]  Pending - Request Sent N/A [address] [place], [place] [address] [phone] --  [facility]  Pending - Request Sent N/A [address], [place] [address] [phone] --  [facility]  Pending - Request Sent N/A [address] [place] [address] [phone] --  [facility]  Pending - Request Sent N/A [address] [place] [address] [phone] --  [facility]  Pending - Request Sent N/A [address] [place] [address] [phone] --  [facility]  Pending - Request Sent N/A [address], [place] [address] [phone] --  [facility]  Pending - Request Sent N/A [address] [place] [address]  [phone] --  [facility]  Pending - Request Sent N/A [address] [place], [place] [address] [phone] --  [facility]  Pending - Request Sent N/A [address], [place] [address] [phone] --  [facility]  Pending - Request Sent N/A [address] [place] [address] [phone] --
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
## 2020-10-27 NOTE — ED Notes (Signed)
Pt given bagged dinner and a sprite. Denied any needs at this time

## 2020-10-27 NOTE — ED Notes (Signed)
Pt taking a shower 

## 2020-10-28 NOTE — ED Notes (Signed)
Report given to Darci Current, RN

## 2020-10-28 NOTE — ED Notes (Signed)
Pt refusing 2200 vitals. Has not been 8 hours since last vitals check. Will try again at later time.

## 2020-10-28 NOTE — Progress Notes (Signed)
Patient information has been sent to [facility] via secure chat to review for potential admission. Patient meets inpatient criteria per Park Pope, MD .   Situation ongoing, CSW will continue to monitor progress.    Signed:  Damita Dunnings, MSW, LCSW-A  10/28/2020 1:11 PM

## 2020-10-28 NOTE — ED Notes (Signed)
Pt shoes and socks taken off pt and placed in locker 7 & 8 of [place] with other belongings

## 2020-10-28 NOTE — BH Assessment (Signed)
Disposition Update:  Upon charge review first shift LCSW: "Patient information has been sent to [facility] via secure chat to review for potential admission. Patient meets inpatient criteria per Park Pope, MD".  @[email or web address] , this Clinician followed up with the [facility], RN) to inquire about updates regarding patient's disposition status at [facility]. Received notice from [facility] that patient remains under review for admission. Situation ongoing, CSW will continue to monitor progress.

## 2020-10-28 NOTE — ED Notes (Signed)
Pt's belongings bag and bookbag placed in lockers 7 & 8. There is also a trashbag full of pt's belongings sitting on the counter under lockers 7 & 8

## 2020-10-29 ENCOUNTER — Inpatient Hospital Stay: Admit: 2020-10-29 | Payer: Medicare Other | Admitting: Obstetrics and Gynecology

## 2020-10-29 DIAGNOSIS — R4689 Other symptoms and signs involving appearance and behavior: Secondary | ICD-10-CM | POA: Diagnosis not present

## 2020-10-29 SURGERY — INCISION AND DRAINAGE, ABSCESS
Anesthesia: Choice

## 2020-10-29 MED ORDER — POTASSIUM CHLORIDE CRYS ER 20 MEQ PO TBCR: 40.0000 meq | EXTENDED_RELEASE_TABLET | Freq: Once | ORAL | Status: DC

## 2020-10-29 MED ORDER — ACETAMINOPHEN 325 MG PO TABS
650.0000 mg | ORAL_TABLET | Freq: Once | ORAL | Status: AC
  Administered 2020-10-29: 650 mg via ORAL
  Filled 2020-10-29: qty 2

## 2020-10-29 NOTE — ED Notes (Signed)
Report given to Madelyn Flavors RN at [facility].

## 2020-10-29 NOTE — Progress Notes (Signed)
Pt accepted to [facility]     Patient meets inpatient criteria per Park Pope, MD   Dr.Jeffery Merlene Morse is the attending provider.    Call report to [phone]  Jess Barters, RN @ [facility] ED notified.     Pt scheduled  to arrive at [facility] today by 1500.    Damita Dunnings, MSW, LCSW-A  8:54 AM 10/29/2020

## 2020-10-29 NOTE — ED Notes (Signed)
Breakfast at the bedside. Pt awake and eating.
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
## 2020-10-29 NOTE — ED Notes (Signed)
Pt requested to speak with magistrate because she is not understanding what is going on. Explained to the pt she will be going to an inpatient facility and transportation will be requested.

## 2020-10-29 NOTE — ED Notes (Signed)
TTS at the bedside. 

## 2020-10-29 NOTE — ED Notes (Signed)
Contacted sheriff to transport pt to [facility].

## 2020-10-29 NOTE — ED Notes (Signed)
Pt stated her neck and back hurting. She requested Tylenol. Notified provider.

## 2020-10-29 NOTE — ED Notes (Signed)
Followed up with Wanda Plump RN about pt not arriving at 1500. She stated the pt can arrive anytime.

## 2020-10-29 NOTE — Progress Notes (Signed)
Patient has been denied by [facility] due to no bed availability. Patient has been faxed out. Patient meets inpatient criteria per Park Pope, MD. Patient referred to the following facilities:  [facility]  [address] [place] [address] [phone]  [facility]  [address] [place] [address] [phone]  [facility]  [address], [place] [address] [phone]  [facility]  [address] [place] [address] [phone]  [facility] [address] [place] [address] [phone]  [facility]  [address] [place] [address] [phone]  [facility]  [address] [place], [place] [address] [phone]  [facility]  [address] [place] [address] [phone]  [facility]  [address] [place] [address] [phone]  [facility]  [address], [place] [address] [phone]  [facility]  [address], [place] [address] [phone]  [facility]  [address], [place] [address] [phone]  [facility]  [address], [place] [address] [phone]  [facility]  [address] [place], [place] [address] [phone]  [facility]  [address], [place] [address] [phone]  [facility]  [address], [place] [address] [phone]    CSW will continue to monitor disposition.    Damita Dunnings, MSW, LCSW-A  8:44 AM 10/29/2020

## 2020-10-29 NOTE — ED Provider Notes (Signed)
Emergency Medicine Observation Re-evaluation Note  Sarinah Doetsch is a 45 y.o. female, seen on rounds today.  Pt initially presented to the ED for complaints of delusions/acute psychosis. Currently calm/alert. Pt awaiting transport to [facility].   Physical Exam  BP 134/71   Pulse 70   Temp 98.1 F (36.7 C) (Oral)   Resp 16   SpO2 100%  Physical Exam General: alert, content,  Cardiac: regular rate  Lungs:  breathing comfortably Psych: calm, alert. +delusional thoughts.   ED Course / MDM    I have reviewed the labs performed to date as well as medications administered while in observation.  Recent changes in the last 24 hours include medication management, ED obs, BH reassessment.   Plan  Patient accepted to [facility], Dr Merlene Morse.   Vitals normal, nad - pt currently appears stable for transfer.       Cathren Laine, MD 10/29/20 1249

## 2020-10-29 NOTE — ED Notes (Signed)
Followed up with [facility] for transportation. Sheriff currently don't have an ETA to pick up pt.

## 2020-10-29 NOTE — Discharge Instructions (Addendum)
Transfer to [facility] 

## 2020-12-26 IMAGING — DX DG CHEST 1V PORT
1 series · 1 of 1 positions shown · non-contrast
Comparison: June 25, 2019

CLINICAL DATA: Respiratory distress.

EXAM:
PORTABLE CHEST 1 VIEW

[chest]
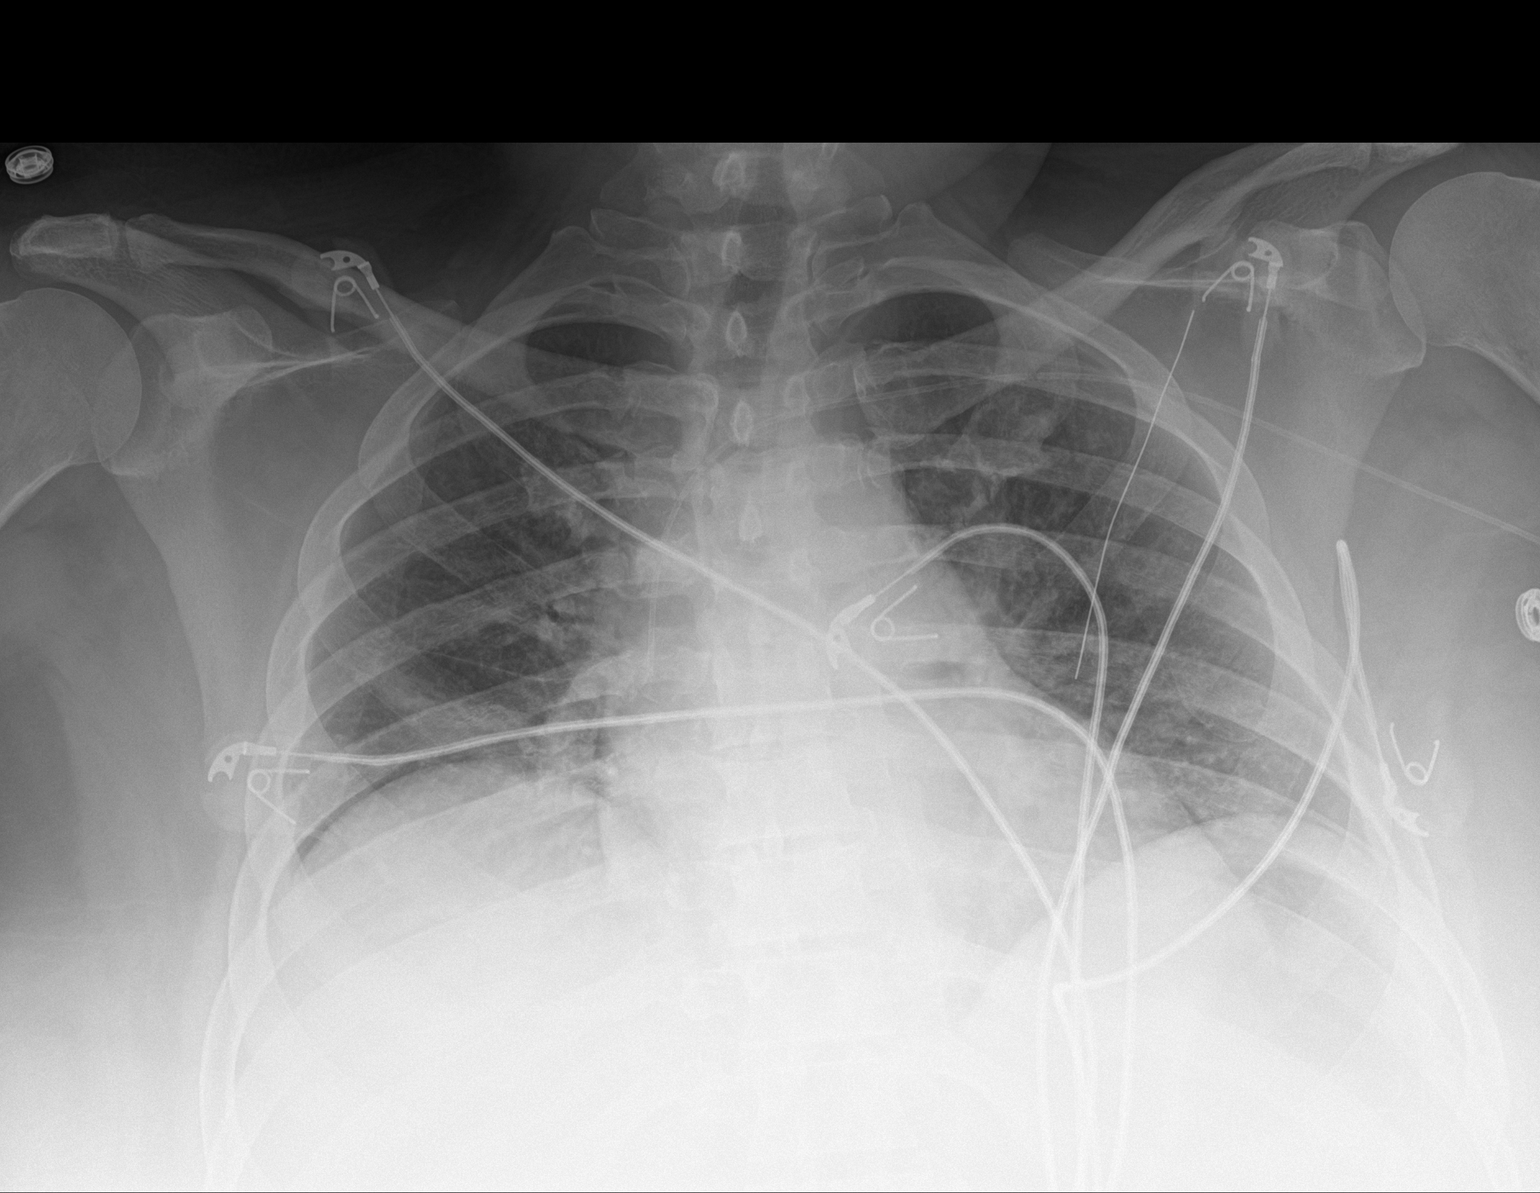

[1 of 1 positions shown; findings below may reference images not displayed]

FINDINGS: Decreased lung volumes are noted which is likely secondary to
suboptimal patient inspiration.

The endotracheal tube, nasogastric tube and right internal jugular
venous catheter seen on the prior study have been removed.

A left-sided PICC line is seen with its distal tip noted at the
junction of the superior vena cava and right atrium.

There is no evidence of acute infiltrate, pleural effusion or
pneumothorax.

A 9.9 cm long thin, linear radiopaque foreign body structure is seen
overlying the mid to upper left lung and is not seen on the prior
exam.

The heart size and mediastinal contours are within normal limits.

The visualized skeletal structures are unremarkable.
IMPRESSION: No active disease.

## 2021-01-01 IMAGING — US US ABDOMEN LIMITED
1 series · 14 of 25 positions shown · non-contrast
Comparison: 07/13/2019

CLINICAL DATA: Abdominal pain

EXAM:
ULTRASOUND ABDOMEN LIMITED RIGHT UPPER QUADRANT

[Series 1: us abdomen limited ruq · 14 of 30 slices shown]
[im 1/30]
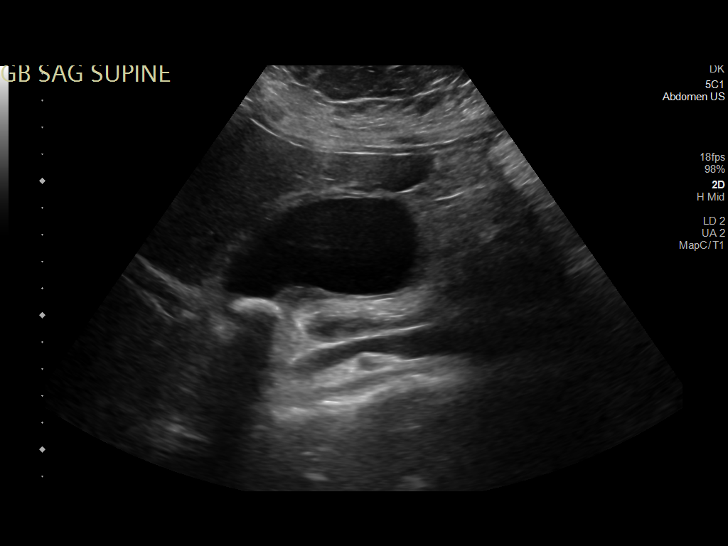
[im 3/30]
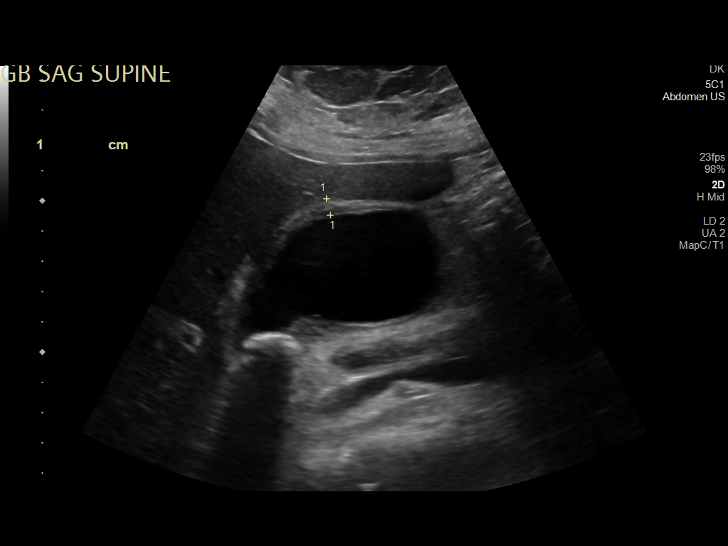
[im 5/30]
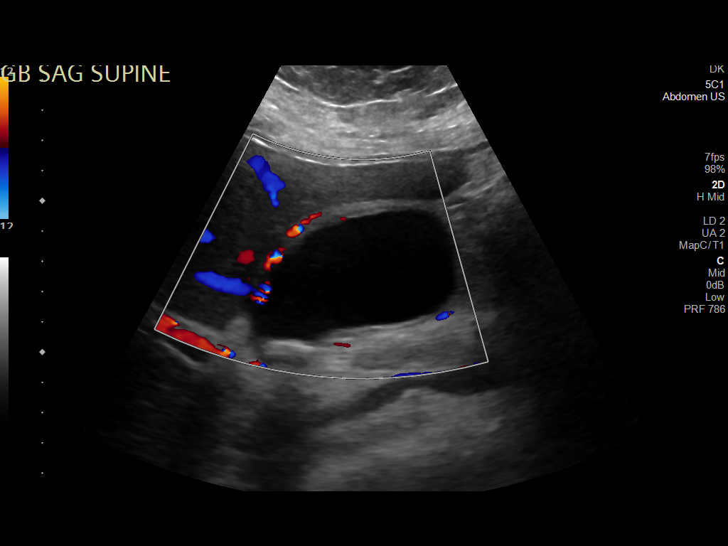
[im 8/30]
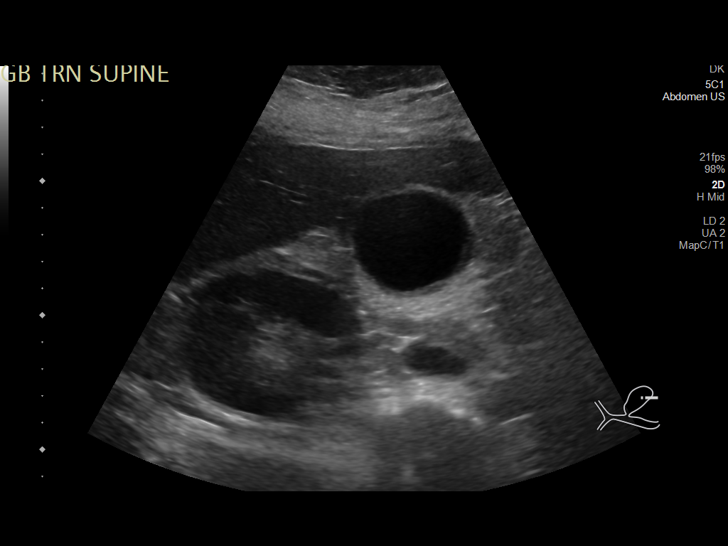
[im 10/30]
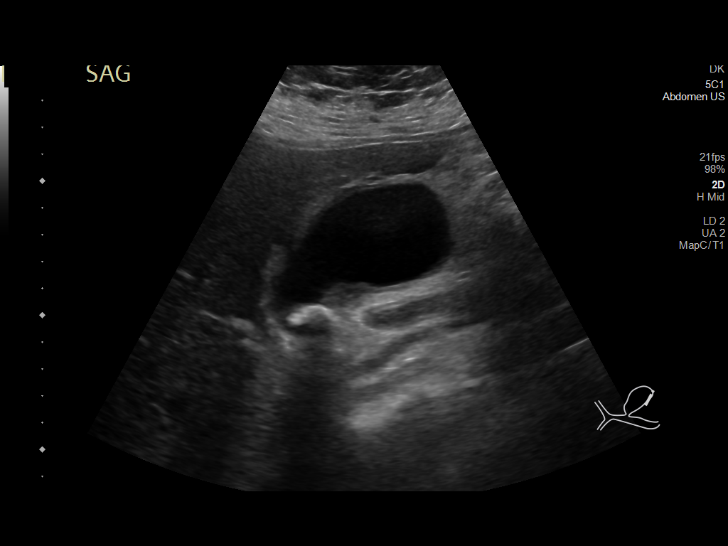
[im 11/30]
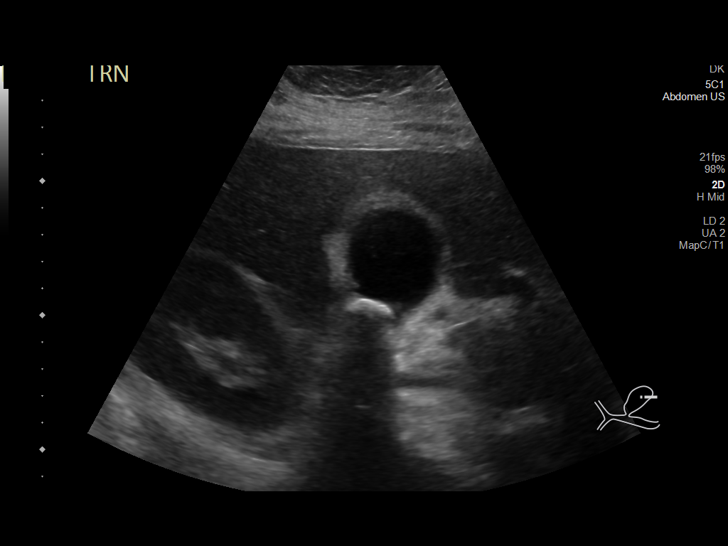
[im 14/30]
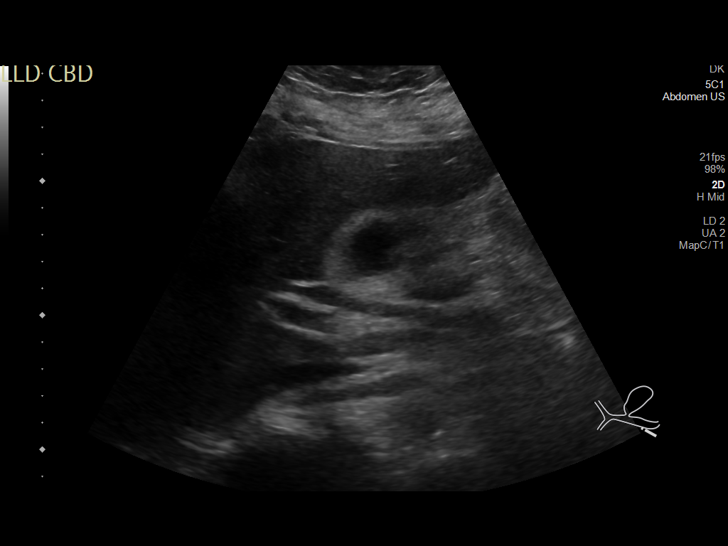
[im 16/30]
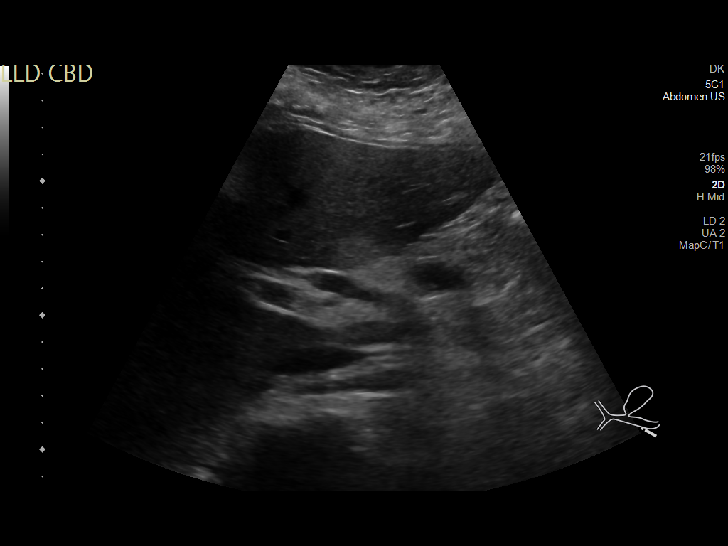
[im 19/30]
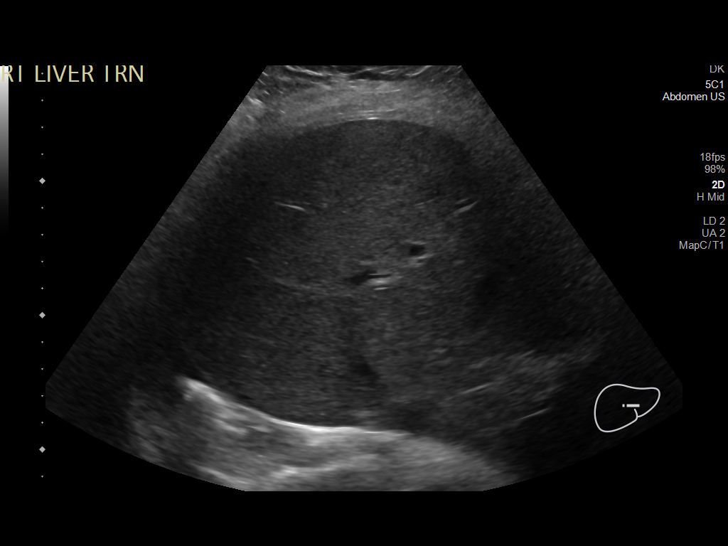
[im 20/30]
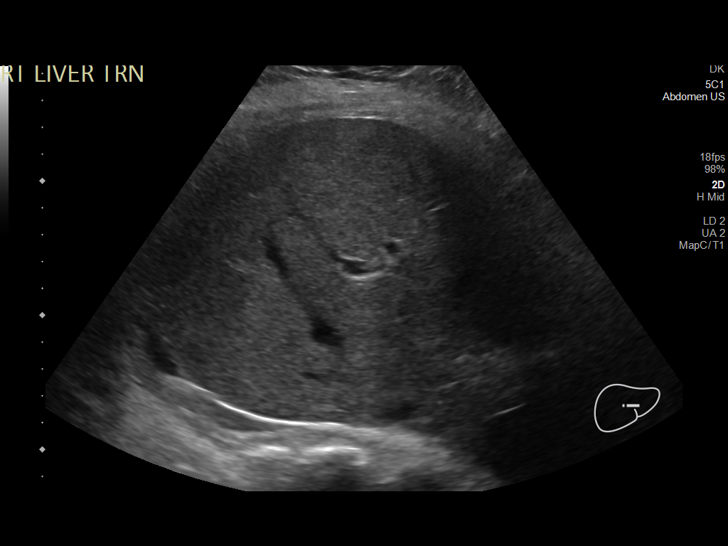
[im 22/30]
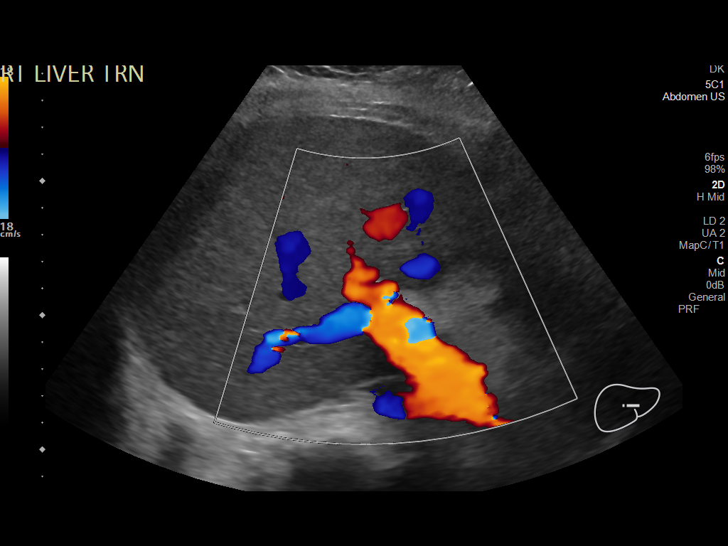
[im 25/30]
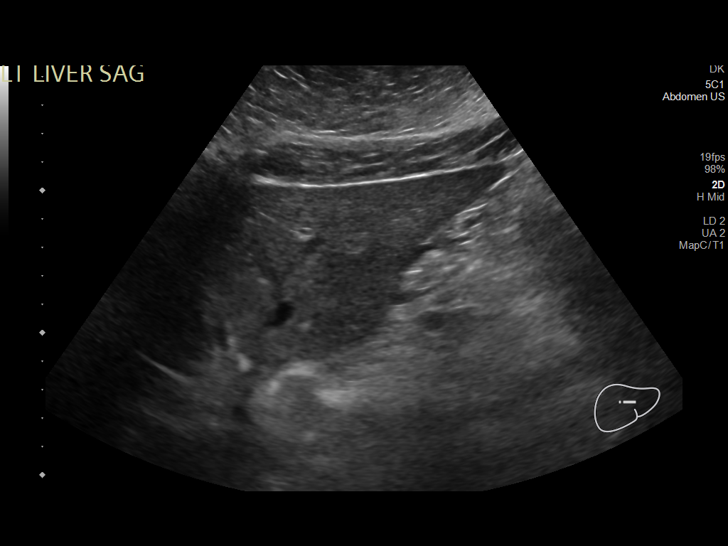
[im 27/30]
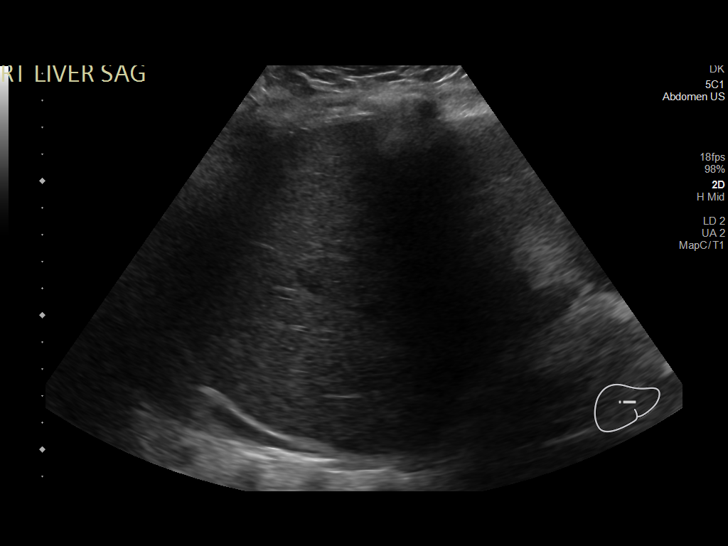
[im 30/30]
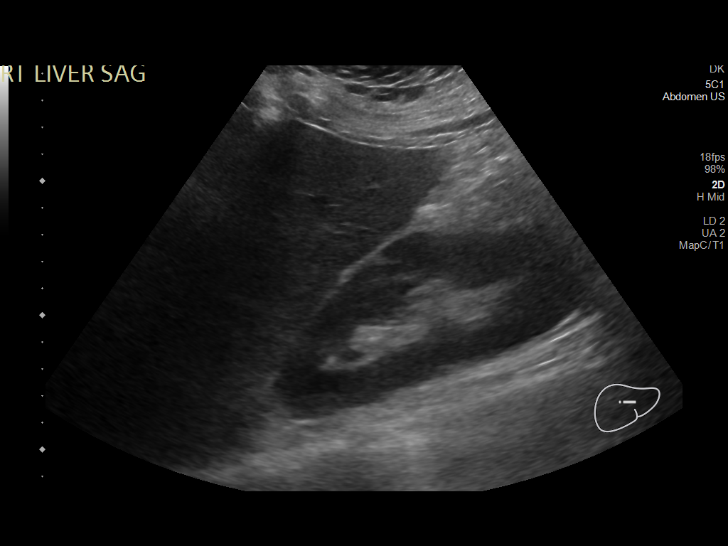

[14 of 25 positions shown; findings below may reference images not displayed]

FINDINGS: Gallbladder:

There is diffuse gallbladder wall thickening which measures 5.5 mm
in maximum thickness. There is a stone within the neck of
gallbladder measuring 2 cm. Gallbladder sludge is present. Positive
sonographic Murphy's sign.

Common bile duct:

Diameter: 6.5 mm

Liver:

No focal lesion identified. Within normal limits in parenchymal
echogenicity. Portal vein is patent on color Doppler imaging with
normal direction of blood flow towards the liver.

Other: None.
IMPRESSION: 1. Diffuse gallbladder wall thickening with gallstone and positive
sonographic Murphy's sign. Imaging findings are compatible with
acute cholecystitis.
2. Small right pleural effusion.

## 2021-01-17 ENCOUNTER — Emergency Department (HOSPITAL_COMMUNITY)
Admission: EM | Admit: 2021-01-17 | Discharge: 2021-01-19 | Disposition: A | Discharge status: Other Acute Inpatient Hospital | Payer: Medicare Other | Attending: Emergency Medicine | Admitting: Emergency Medicine

## 2021-01-17 ENCOUNTER — Other Ambulatory Visit: Payer: Self-pay

## 2021-01-17 DIAGNOSIS — F29 Unspecified psychosis not due to a substance or known physiological condition: Secondary | ICD-10-CM | POA: Diagnosis not present

## 2021-01-17 DIAGNOSIS — F250 Schizoaffective disorder, bipolar type: Secondary | ICD-10-CM | POA: Diagnosis present

## 2021-01-17 DIAGNOSIS — I10 Essential (primary) hypertension: Secondary | ICD-10-CM | POA: Diagnosis not present

## 2021-01-17 DIAGNOSIS — Z20822 Contact with and (suspected) exposure to covid-19: Secondary | ICD-10-CM | POA: Diagnosis not present

## 2021-01-17 DIAGNOSIS — F17210 Nicotine dependence, cigarettes, uncomplicated: Secondary | ICD-10-CM | POA: Diagnosis not present

## 2021-01-17 DIAGNOSIS — R451 Restlessness and agitation: Secondary | ICD-10-CM | POA: Diagnosis not present

## 2021-01-17 DIAGNOSIS — Z79899 Other long term (current) drug therapy: Secondary | ICD-10-CM | POA: Diagnosis not present

## 2021-01-17 LAB — CBC
HCT: 46.3 % — ABNORMAL HIGH (ref 36.0–46.0)
Hemoglobin: 14.3 g/dL (ref 12.0–15.0)
MCH: 27.2 pg (ref 26.0–34.0)
MCHC: 30.9 g/dL (ref 30.0–36.0)
MCV: 88.2 fL (ref 80.0–100.0)
Platelets: 343 10*3/uL (ref 150–400)
RBC: 5.25 MIL/uL — ABNORMAL HIGH (ref 3.87–5.11)
RDW: 14.7 % (ref 11.5–15.5)
WBC: 10 10*3/uL (ref 4.0–10.5)
nRBC: 0 % (ref 0.0–0.2)

## 2021-01-17 LAB — ACETAMINOPHEN LEVEL: Acetaminophen (Tylenol), Serum: <10 ug/mL — ABNORMAL LOW (ref 10–30)

## 2021-01-17 LAB — COMPREHENSIVE METABOLIC PANEL
ALT: 10 U/L (ref 0–44)
AST: 17 U/L (ref 15–41)
Albumin: 3.3 g/dL — ABNORMAL LOW (ref 3.5–5.0)
Alkaline Phosphatase: 90 U/L (ref 38–126)
Anion gap: 11 (ref 5–15)
BUN: <5 mg/dL — ABNORMAL LOW (ref 6–20)
CO2: 25 mmol/L (ref 22–32)
Calcium: 8.6 mg/dL — ABNORMAL LOW (ref 8.9–10.3)
Chloride: 105 mmol/L (ref 98–111)
Creatinine, Ser: 0.81 mg/dL (ref 0.44–1.00)
GFR, Estimated: >60 mL/min (ref >60)
Glucose, Bld: 156 mg/dL — ABNORMAL HIGH (ref 70–99)
Potassium: 4.1 mmol/L (ref 3.5–5.1)
Sodium: 141 mmol/L (ref 135–145)
Total Bilirubin: 0.5 mg/dL (ref 0.3–1.2)
Total Protein: 7.2 g/dL (ref 6.5–8.1)

## 2021-01-17 LAB — SALICYLATE LEVEL: Salicylate Lvl: <7 mg/dL — ABNORMAL LOW (ref 7.0–30.0)

## 2021-01-17 LAB — ETHANOL: Alcohol, Ethyl (B): <10 mg/dL (ref <10)

## 2021-01-17 MED ORDER — CHLORTHALIDONE 25 MG PO TABS
25.0000 mg | ORAL_TABLET | Freq: Every day | ORAL | Status: DC
  Filled 2021-01-17 (×3): qty 1
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
## 2021-01-17 NOTE — ED Provider Notes (Signed)
[facility] EMERGENCY DEPARTMENT Provider Note   CSN: [id number] Arrival date & time: 01/17/21  1424     History No chief complaint on file.   Kimberly Orozco is a 45 y.o. female.  Kimberly Orozco is a 45 y.o. female with a history of bipolar disorder, schizophrenia and hypertension, who presents to the emergency department with [employer] under IVC by patient's mother.  Per IVC paperwork patient has been living in her car and not tending to her personal hygiene.  She has been making threats towards members of the family and trying to get her grandsons to kill themselves.  She has been witnessed responding to external stimuli.  Upon arrival patient was yelling and refusing all treatment or assessment.  On my evaluation patient is asking for food when I ask her questions about what brings her here today or if she is having any medical plaints she quickly becomes irritated begins yelling that other people have already asked her this and she does not want to talk to anyone else.  When asked about suicidal ideations patient states "I am not white that is only for white people I do not want to hurt myself".  She will not answer questions regarding HI or AVH.  She denies chest pain, shortness of breath or abdominal pain but will not participate in any further history by myself.  Patient repeatedly requesting food but reports she does not want to answer any other questions.  The history is provided by the patient, medical records and the police.      Past Medical History:  Diagnosis Date   AKI (acute kidney injury) (HCC)    Bipolar disorder (HCC)    Hypertension    not on medications   Schizophrenia [facility])     Patient Active Problem List   Diagnosis Date Noted   Aggressive behavior, adult 10/29/2020   Schizoaffective disorder, in remission (HCC) 05/25/2020   Healthcare maintenance 12/11/2019   Hypertension 11/27/2019   Bipolar disorder (HCC) 04/26/2019    Past Surgical  History:  Procedure Laterality Date   APPLICATION OF A-CELL OF EXTREMITY N/A 07/08/2019   Procedure: with placement of primatrix AG;  Surgeon: Allena Napoleon, MD;  Location: [facility] OR;  Service: Plastics;  Laterality: N/A;   APPLICATION OF A-CELL OF EXTREMITY Right 07/22/2019   Procedure: placement of primatrix mesh;  Surgeon: Allena Napoleon, MD;  Location: [facility] OR;  Service: Plastics;  Laterality: Right;   INCISION AND DRAINAGE OF WOUND N/A 06/26/2019   Procedure: IRRIGATION AND DEBRIDEMENT ABDOMINAL WALL AND GROIN;  Surgeon: Axel Filler, MD;  Location: [facility] OR;  Service: General;  Laterality: N/A;   INCISION AND DRAINAGE OF WOUND N/A 07/08/2019   Procedure: Debridement of abdominal and groin wound;  Surgeon: Allena Napoleon, MD;  Location: [facility] OR;  Service: Plastics;  Laterality: N/A;   INCISION AND DRAINAGE PERIRECTAL ABSCESS Right 06/24/2019   Procedure: IRRIGATION AND DEBRIDEMENT RIGHT GROIN AND RIGHT BUTTOCK;  Surgeon: Axel Filler, MD;  Location: [facility] OR;  Service: General;  Laterality: Right;   IRRIGATION AND DEBRIDEMENT OF WOUND WITH SPLIT THICKNESS SKIN GRAFT Right 07/22/2019   Procedure: Debridement of right thigh wound;  Surgeon: Allena Napoleon, MD;  Location: [facility] OR;  Service: Plastics;  Laterality: Right;   IRRIGATION AND DEBRIDEMENT OF WOUND WITH SPLIT THICKNESS SKIN GRAFT Right 10/28/2019   Procedure: Debridement of right thigh wound with placement of split thickness skin graft;  Surgeon: Allena Napoleon, MD;  Location: [facility] OR;  Service: Clinical cytogeneticist;  Laterality: Right;     OB History   No obstetric history on file.     Family History  Family history unknown: Yes    Social History   Tobacco Use   Smoking status: Every Day    Types: Cigarettes   Smokeless tobacco: Never   Tobacco comments:    4-5 per day   Vaping Use   Vaping Use: Never used  Substance Use Topics   Alcohol use: Not Currently   Drug use: Never    Home Medications Prior to Admission medications    Medication Sig Start Date End Date Taking? Authorizing Provider  acetaminophen (TYLENOL) 500 MG tablet Take 1,000 mg by mouth every 6 (six) hours as needed for moderate pain.    [provider]  benzoyl peroxide-erythromycin (BENZAMYCIN) gel Apply 1 application topically 2 (two) times daily. Patient not taking: Reported on 10/27/2020 09/15/20   Elsie Stain, MD  chlorthalidone (HYGROTON) 25 MG tablet Take 1 tablet (25 mg total) by mouth daily. Patient not taking: Reported on 10/27/2020 08/25/20   Elsie Stain, MD    Allergies    Patient has no known allergies.  Review of Systems   Review of Systems  Unable to perform ROS: Psychiatric disorder   Physical Exam Updated Vital Signs BP (!) 177/98   Pulse 81   Temp 98.4 F (36.9 C)   Resp 16   SpO2 95%   Physical Exam Vitals and nursing note reviewed.  Constitutional:      General: She is not in acute distress.    Appearance: Normal appearance. She is well-developed. She is not ill-appearing or diaphoretic.     Comments: Alert, agitated and yelling at staff  HENT:     Head: Normocephalic and atraumatic.  Eyes:     General:        Right eye: No discharge.        Left eye: No discharge.  Cardiovascular:     Rate and Rhythm: Normal rate and regular rhythm.  Pulmonary:     Effort: Pulmonary effort is normal. No respiratory distress.     Breath sounds: Normal breath sounds.  Abdominal:     General: There is no distension.  Musculoskeletal:        General: No deformity.  Neurological:     Mental Status: She is alert and oriented to person, place, and time.     Coordination: Coordination normal.     Comments: Speech is clear, able to follow commands when she wants to, Moving all extremities with coordination intact  Psychiatric:        Mood and Affect: Mood normal. Affect is labile and angry.        Speech: She is noncommunicative.        Behavior: Behavior is agitated and aggressive.        Thought Content:  Thought content does not include homicidal or suicidal ideation.    ED Results / Procedures / Treatments   Labs (all labs ordered are listed, but only abnormal results are displayed) Labs Reviewed  COMPREHENSIVE METABOLIC PANEL - Abnormal; Notable for the following components:      Result Value   Glucose, Bld 156 (*)    BUN <5 (*)    Calcium 8.6 (*)    Albumin 3.3 (*)    All other components within normal limits  SALICYLATE LEVEL - Abnormal; Notable for the following components:   Salicylate Lvl [id number] (*)    All  other components within normal limits  ACETAMINOPHEN LEVEL - Abnormal; Notable for the following components:   Acetaminophen (Tylenol), Serum <10 (*)    All other components within normal limits  CBC - Abnormal; Notable for the following components:   RBC 5.25 (*)    HCT 46.3 (*)    All other components within normal limits  ETHANOL  RAPID URINE DRUG SCREEN, HOSP PERFORMED  POC URINE PREG, ED    EKG None  Radiology No results found.  Procedures Procedures   Medications Ordered in ED Medications  chlorthalidone (HYGROTON) tablet 25 mg (has no administration in time range)    ED Course  I have reviewed the triage vital signs and the nursing notes.  Pertinent labs & imaging results that were available during my care of the patient were reviewed by me and considered in my medical decision making (see chart for details).    MDM Rules/Calculators/A&P                           45 year old female arrives via [employer] under IVC by her mom.  Patient with underlying history of schizophrenia and bipolar disorder, has not been caring for herself or tending to her personal hygiene, has been making threats towards family members and trying to convince her grandsons to kill themselves.  On arrival she is agitated and aggressive towards staff but was able to be verbally de-escalated.  Patient is very unwilling to participate in additional history or evaluation stating that she  is already answered all these questions and she does not want to talk to anyone and only want something to eat and to leave.  Patient with no focal medical complaints.  She is noted to be hypertensive, has been off of her blood pressure medications for a few months, I have ordered this for her here in the ED.  We will get medical screening labs.  First exam completed for IVC.  Patient remains in the ED, currently stable and awaiting TTS evaluation.  Medical screening labs are overall reassuring, no leukocytosis and normal hemoglobin, glucose of 126 but no other significant electrolyte derangements, normal renal function, tox labs are negative thus far, UDS is pending.  At this time patient is medically cleared pending TTS evaluation.  The patient has been placed in psychiatric observation due to the need to provide a safe environment for the patient while obtaining psychiatric consultation and evaluation, as well as ongoing medical and medication management to treat the patient's condition.  The patient has been placed under full IVC at this time.  Final Clinical Impression(s) / ED Diagnoses Final diagnoses:  Psychosis, unspecified psychosis type [facility])    Rx / DC Orders ED Discharge Orders     None        Jacqlyn Larsen, [place] 01/17/21 1851    Wyvonnia Dusky, MD 01/17/21 2144

## 2021-01-17 NOTE — ED Notes (Signed)
IVC paperwork done and handed to nurse Ivin Booty

## 2021-01-17 NOTE — BH Assessment (Signed)
Clinician attempted to complete pt's MH Assessment but pt refused to participate, using foul language and speaking negatively about staff. Clinician informed pt that it was her right to refuse the assessment but that by doing so it would take longer for her to be d/c. Pt stated, "whatever," and walked out of the room, calling clinician a "fat, stupid, white piece of shit."

## 2021-01-17 NOTE — ED Triage Notes (Signed)
Pt via [employer]-IVC'd by mother. Hx of schizophrenia and has been living in her car, not tending to personal hygiene, trying to get her grandsons to kill themselves, threatening other family members, responding to external stimuli. Pt yelling and refusing all treatment or assessment measures in triage.

## 2021-01-17 NOTE — ED Notes (Signed)
Attempted to take pt's VS for triage, she stated "she didn't want that or anything else done".

## 2021-01-18 DIAGNOSIS — F250 Schizoaffective disorder, bipolar type: Secondary | ICD-10-CM | POA: Insufficient documentation

## 2021-01-18 LAB — RESP PANEL BY RT-PCR (FLU A&B, COVID) ARPGX2
Influenza A by PCR: NEGATIVE
Influenza B by PCR: NEGATIVE
SARS Coronavirus 2 by RT PCR: NEGATIVE

## 2021-01-18 NOTE — ED Notes (Signed)
Pt continues to refuse care and is in NAD at this time.

## 2021-01-18 NOTE — ED Notes (Addendum)
After therapeutic conversation, pt allowed RN to collect COVID swab but remains adamant that she does not want to be transferred anywhere outside of [place]. She says that she is supposed to be discharged if they are unable to find a place for her in [place] and does not understand why we will not just let her leave now. RN explained IVC and that she cannot be released until IVC is rescinded, but she does not seem to accept this information. Will continue to monitor.

## 2021-01-18 NOTE — ED Notes (Signed)
Pt ate breakfast. Obtained VS and EKG.

## 2021-01-18 NOTE — Progress Notes (Signed)
Pt was accepted to [facility]; Main campus PENDING Negative COVID-19 result. Pt is able to admit anytime today or tomorrow once negative COVID-19 results are received.   Pt meets inpatient criteria per Dr. Lucianne Muss  Attending Physician will be Dr. Estill Cotta  Report can be called to: - [phone].  Pt can arrive after anytime today or tomorrow pending negative COVID-19 results.   Care Team notified per secure chat: Katheran James, RN. CSW request that nursing add EDP to chat so that they are aware of pt's updated disposition. CSW requested update on completion of COVID-19 testing. CSW requested that negative COVID-19 result be faxed to [phone].  Kelton Pillar, LCSWA 01/18/2021 @ 3:37 PM

## 2021-01-18 NOTE — ED Notes (Addendum)
Per LCSW, Pt accepted to [facility] Pending a NEGATIVE COVID-19 test. Pt is able to admitted anytime today or tomorrow as long as negative COVID-19 is received. Accepting admitting physician is Dr. Estill Cotta. Phone number for report is [phone].    Fax negative COVID test result to Fax number [phone]

## 2021-01-18 NOTE — ED Notes (Signed)
Pt is currently sleeping in NAD 

## 2021-01-18 NOTE — ED Notes (Signed)
Received return call from [facility] and per Sergeant, pt will be transported tomorrow morning around 8am. He notes that they will need to confirm that her paperwork is correct in order to facilitate transport, and they will call in the morning to also confirm ETA.

## 2021-01-18 NOTE — ED Notes (Signed)
Pt calling her uncle.

## 2021-01-18 NOTE — Progress Notes (Addendum)
Pt Accepted to [facility] and will transport via IVC status/ [employer] tomorrow after [phone].  CSW notified by pt's nursing staff Katheran James, RN that pt does not wish to go to [facility] or any facility outside of [place].  CSW informed nursing that CSW would follow back up with [facility] to see their availability. Per [facility] AC Malva Limes, RN there are currently no beds for though disorder.  CSW reached back out to Katheran James, RN to inform that there is no availability at [facility] and pt must move forward with the bed offer from [facility] because pt is IVC'd and there is appropriate placement at [facility] for pt.   Katheran James, RN advised that she would communicate with pt about going to [facility].   Jamie Blue-Matthews, RN reported to follow up with security and Trula Ore, RN agreed to call security for assistance with pt. Jamie Blue-Matthews, RN provided education about obtaining labs and COVID-19 since pt is IVC'd pt cannot refuse.  CSW called and spoke with Admission at [facility] and was informed that pt's bed will be held for over 24 hours, and advised that pt must have a negative COVID-19 result in order to admit to [facility].    @6 :27pm, , RN advised CSW that the COVID-19 results were negative and that she would fax shortly. Katheran James, RN also reports that the [facility] department had no availability to transport pt and transportation would happen after 0800am tomorrow.    @10 :46pm CSW faxed negative COVID-19 results to [facility] [phone].  CSW will continue to assist and follow pt with placement for inpatient behavioral health.    [facility], MSW, [facility] 01/18/2021 4:47 PM

## 2021-01-18 NOTE — ED Notes (Addendum)
Pt is very agitated and becoming more and more short-tempered. She is adamant that she does not want to go to [facility] despite being told that she has been accepted there as a patient, and she says that she needs a lawyer because Teodoro Kil is not in [place] and we are trying to displace her, noting that she is also homeless. Multiple angry phone calls placed to outside numbers complaining of staff being uneducated and "in her face." She is also refusing all care including urine specimen and covid swab.

## 2021-01-18 NOTE — ED Notes (Signed)
Faxed negative Covid to facility

## 2021-01-18 NOTE — ED Notes (Signed)
Pt moved to hall 18.  As RN introduced self she began yelling at this RN, "Why won't you people leave me alone."  RN attempted to get pt to change her clothing as it looks like she is sitting in feces.  She refused however asked for a recliner.  RN explained that once her clothing was changed RN would locate a recliner for the pt.  She once again began yelling.    Pt then asked for water, which was given.

## 2021-01-18 NOTE — ED Notes (Addendum)
Pt returned from TTS, according to staff she did not cooperate w/ the assessment.  Pt brought back to hall bed.  She continues to refuses all requests by staff.  Provider aware

## 2021-01-18 NOTE — ED Notes (Signed)
Belongings placed in locker # 7

## 2021-01-18 NOTE — ED Provider Notes (Signed)
Emergency Medicine Observation Re-evaluation Note  Kimberly Orozco is a 45 y.o. female, seen on rounds today.  Pt initially presented to the ED for complaints of Bipolar Disorder, not taking medications Currently, the patient is sitting in bed eating breakfast.  Physical Exam  BP (!) 146/93 (BP Location: Right Arm)   Pulse 80   Temp 98 F (36.7 C) (Axillary)   Resp 18   SpO2 100%  Physical Exam General: NAD Cardiac: RRR Lungs: No resp distress Psych: SI  ED Course / MDM  EKG:   I have reviewed the labs performed to date as well as medications administered while in observation.  Recent changes in the last 24 hours include none.  Plan  Current plan is for inpt criteria waiting for placement. Kimberly Orozco is under involuntary commitment.      Gwyneth Sprout, MD 01/18/21 1157

## 2021-01-18 NOTE — Progress Notes (Signed)
Patient has been faxed out due to no appropriate beds at this time. Patient meets [facility] inpatient criteria per Dr. Lucianne Muss. Patient has been faxed out to the following facilities:   [facility]  [address], [place] [address] [phone]  [facility]  [address], [place] [address] [phone]  [facility]  [address], [place] [address] [phone]  [facility]  [address] [place] [address] [phone]  [facility]  [address] [place] [address] [phone]  [facility]  [address] [place], [place] [address] [phone]  [facility]  [address] [place], [place] [address] [phone]  [facility]  [id number] [place] [address] [phone]  [facility]  [address] [place] [address] [phone]  [facility]  [address] [place] [address] [phone]  [facility]  [address] [place] [address] [phone]  [facility]  [address] [place] [address] [phone]   Damita Dunnings, MSW, LCSW-A  3:06 PM 01/18/2021

## 2021-01-18 NOTE — ED Notes (Signed)
Pt updated on POC, knows she is awaiting inpatient treatment. Pt denied wanting shower. Asked for warm blanket, which was provided.
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
## 2021-01-18 NOTE — ED Notes (Signed)
Pt up to restroom; refusing to give urine sample

## 2021-01-18 NOTE — ED Notes (Signed)
TTS in progress; pt calm, cooperative at  this time

## 2021-01-18 NOTE — ED Notes (Signed)
Pt eating breakfast; this RN attempted to obtain vitals; pt refusing, states "I want to eat"; will try again after pt finishes eating breakfast

## 2021-01-18 NOTE — ED Notes (Signed)
Called to [facility] Dept at [phone]; no answer. Left VM requesting return call to schedule transport of pt to [facility].
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
## 2021-01-19 MED ORDER — LORAZEPAM 1 MG PO TABS: 1.0000 mg | ORAL_TABLET | Freq: Once | ORAL | Status: DC | PRN

## 2021-01-19 MED ORDER — LORAZEPAM 2 MG/ML IJ SOLN: 1.0000 mg | Freq: Once | INTRAMUSCULAR | Status: DC | PRN

## 2021-01-19 NOTE — ED Notes (Signed)
Attempted to call report x2, unsuccessful, awaiting a call back

## 2021-01-19 NOTE — ED Provider Notes (Signed)
  Physical Exam  BP (!) 135/95 (BP Location: Right Arm)   Pulse 78   Temp 97.7 F (36.5 C) (Oral)   Resp 18   SpO2 100%   Physical Exam  ED Course/Procedures     Procedures  MDM  Patient pending transfer to [facility].  Accepted by Dr. Loyola Mast.  Is under IVC.  Awaiting sheriff.  Reevaluated prior to transfer.       Benjiman Core, MD 01/19/21 [phone]

## 2021-01-19 NOTE — ED Notes (Signed)
Patient refusing medication, states "I am good right now I do not want anything"  Advised patient it was a blood pressure pill, continues to refuse.

## 2021-01-19 NOTE — ED Notes (Signed)
Spoke to [employer], they are arranging pick up for patient now

## 2021-01-19 NOTE — ED Notes (Signed)
Patient refused vitals when asked. Patient stated "not now, later"

## 2021-01-27 ENCOUNTER — Other Ambulatory Visit (HOSPITAL_COMMUNITY): Payer: Self-pay

## 2021-01-27 MED ORDER — ARIPIPRAZOLE 15 MG PO TABS
15.0000 mg | ORAL_TABLET | Freq: Every day | ORAL | 1 refills | Status: AC
  Filled 2021-01-27: qty 15, 15d supply, fill #0

## 2021-01-27 MED ORDER — HYDROCHLOROTHIAZIDE 25 MG PO TABS
25.0000 mg | ORAL_TABLET | Freq: Every day | ORAL | 1 refills | Status: AC
  Filled 2021-01-27: qty 15, 15d supply, fill #0

## 2021-02-07 ENCOUNTER — Other Ambulatory Visit (HOSPITAL_COMMUNITY): Payer: Self-pay

## 2021-02-14 ENCOUNTER — Encounter: Payer: Medicare Other | Admitting: Internal Medicine

## 2021-02-14 ENCOUNTER — Telehealth: Payer: Self-pay | Admitting: *Deleted

## 2021-02-14 NOTE — Telephone Encounter (Signed)
Called patient unable to leave message on cell and no answer on home phone. Call regarding her missed appointment.

## 2021-02-23 IMAGING — CT CT ABD-PELV W/O CM
2 of 4 series · 16 of 46 positions shown, 18 images · non-contrast
Comparison: 07/13/2019

CLINICAL DATA: Abdominal and thigh pain

EXAM:
CT ABDOMEN AND PELVIS WITHOUT CONTRAST
TECHNIQUE: Multidetector CT imaging of the abdomen and pelvis was performed
following the standard protocol without IV contrast.

[Series 3: ap without · axial · non-contrast · 0.98mm/px · z∈[-626,+4]mm · 13 of 140 slices shown, 15 images]
[im 7/140  soft-tissue]
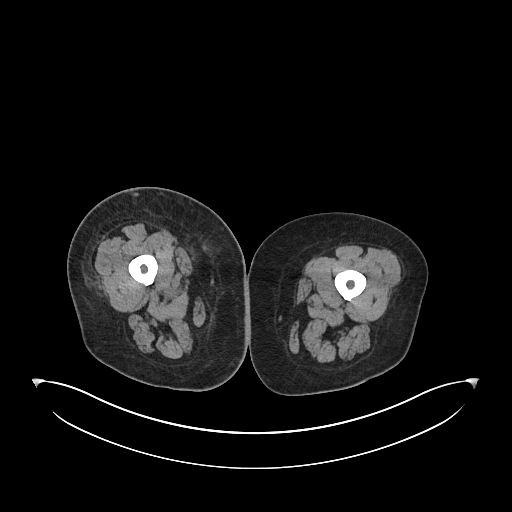
[im 7/140  bone]
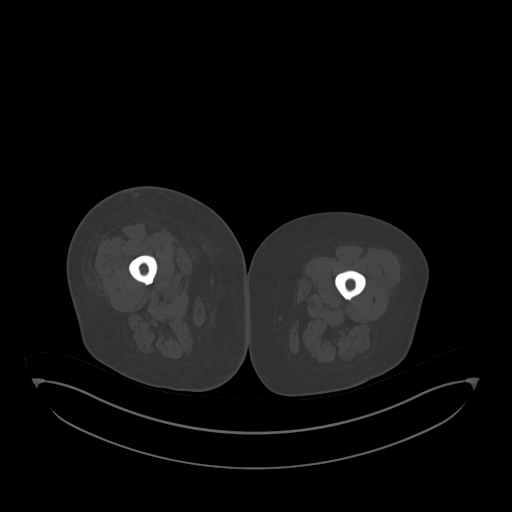
[im 21/140  soft-tissue]
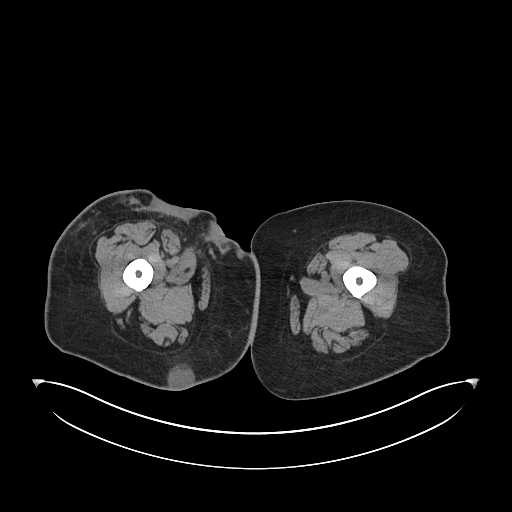
[im 28/140  soft-tissue]
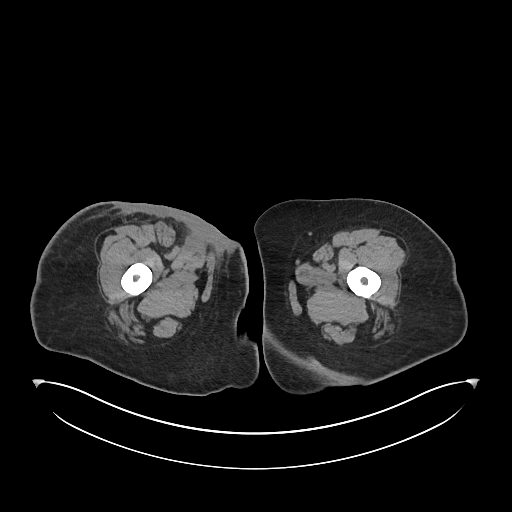
[im 42/140  soft-tissue]
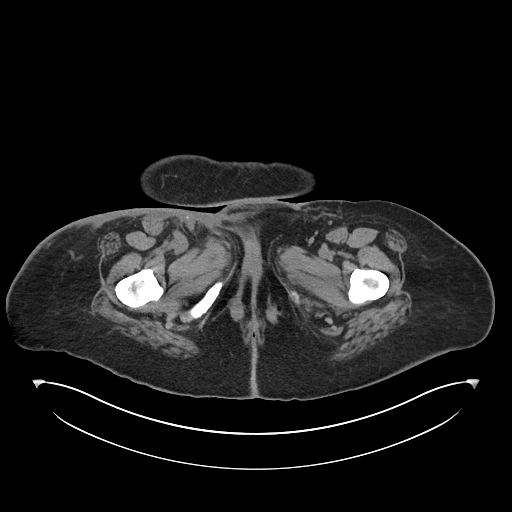
[im 49/140  soft-tissue]
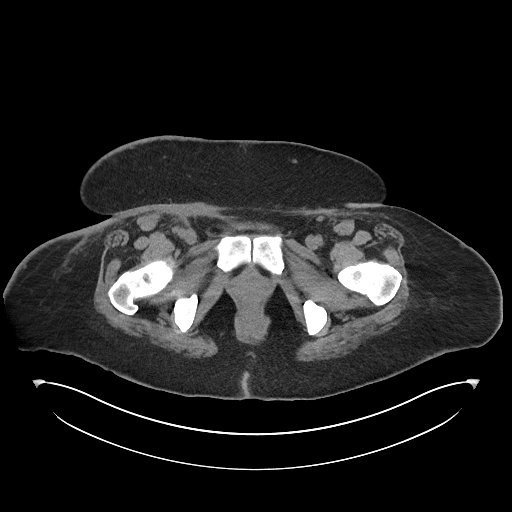
[im 63/140  soft-tissue]
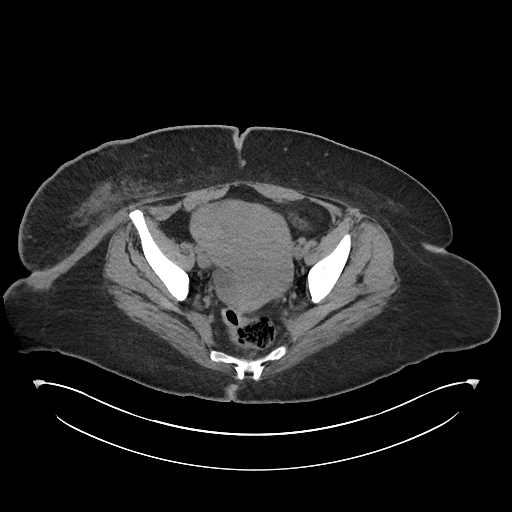
[im 70/140  soft-tissue]
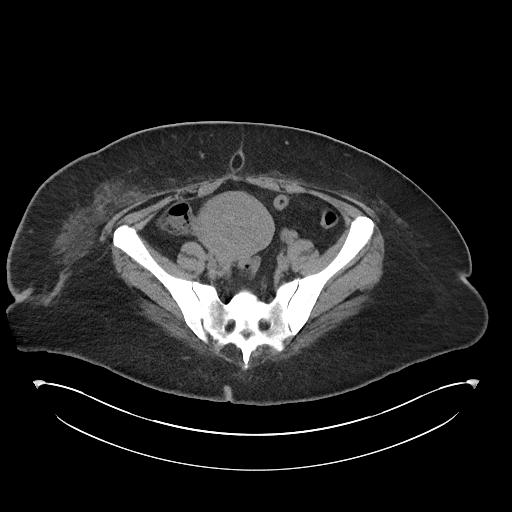
[im 77/140  soft-tissue]
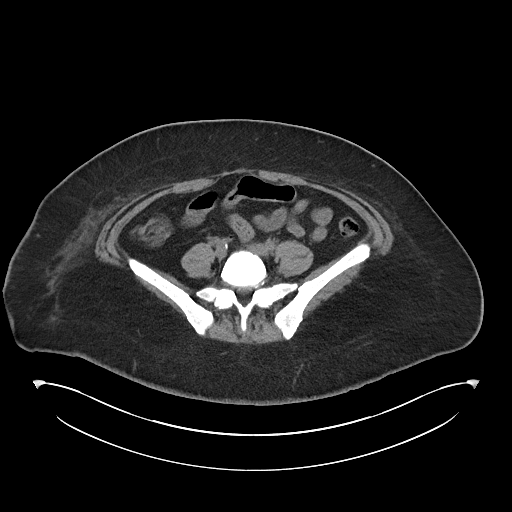
[im 91/140  soft-tissue]
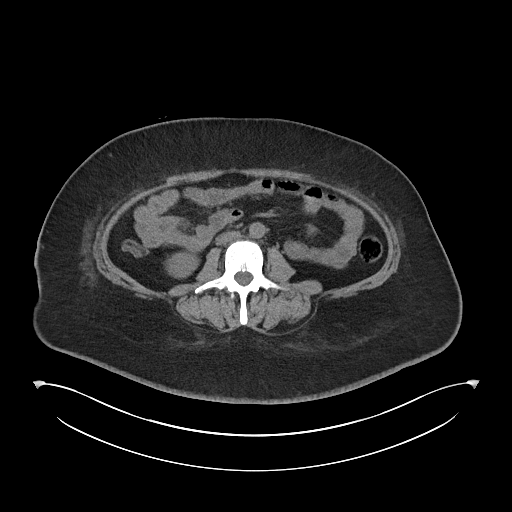
[im 91/140  bone]
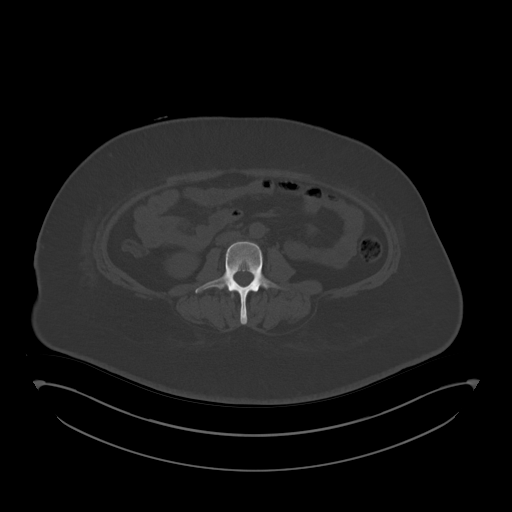
[im 98/140  soft-tissue]
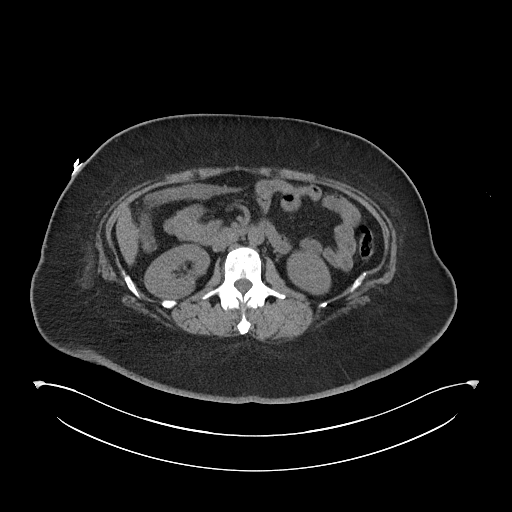
[im 112/140  soft-tissue]
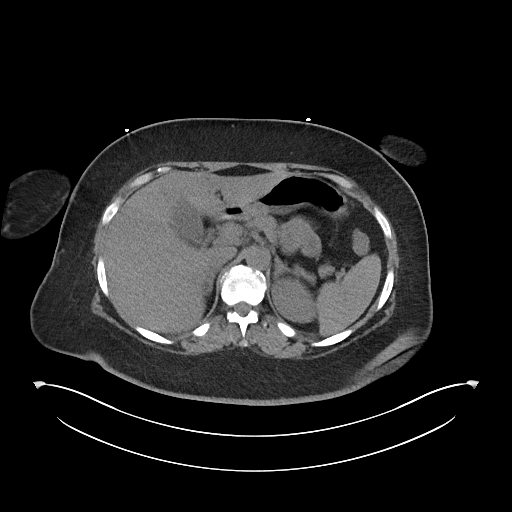
[im 119/140  soft-tissue]
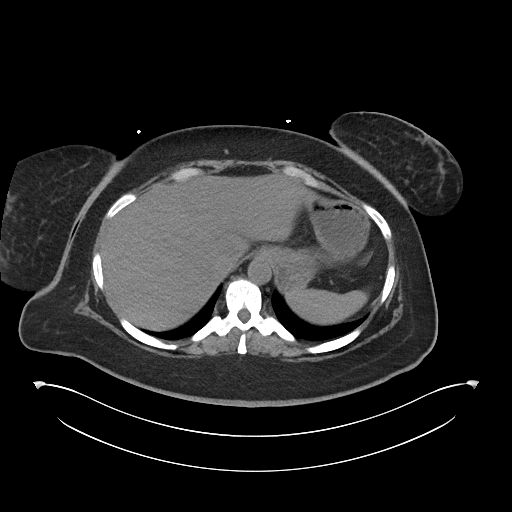
[im 133/140  soft-tissue]
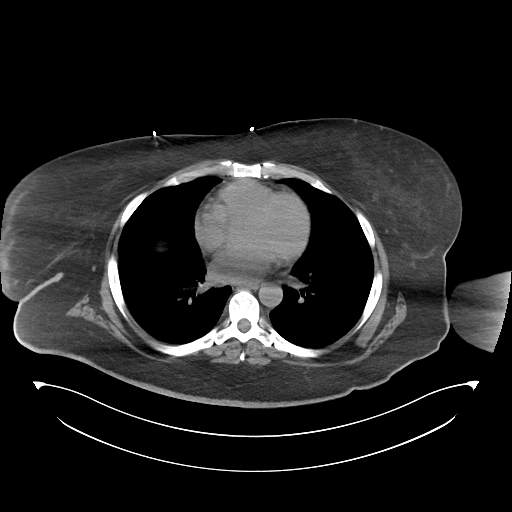

[Series 6: cor · coronal · 1.05mm/px · 3 of 114 slices shown]
[im 38/114  soft-tissue]
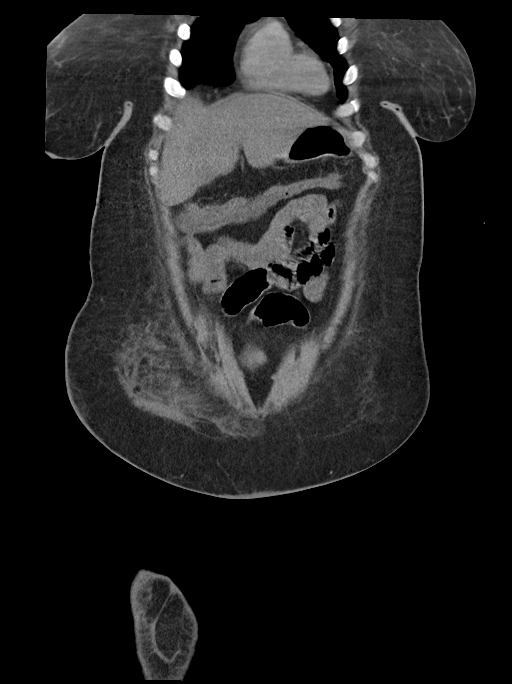
[im 51/114  soft-tissue]
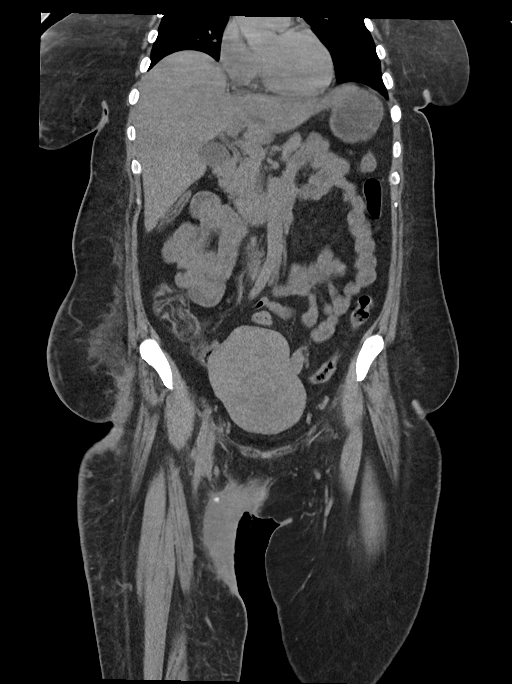
[im 63/114  soft-tissue]
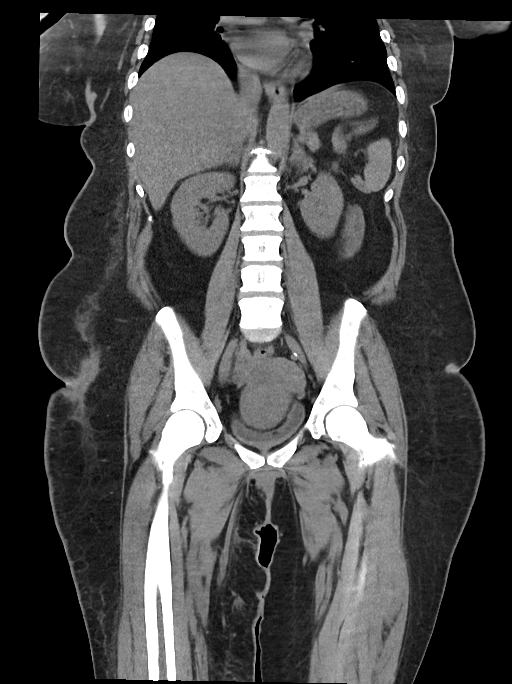

[16 of 46 positions shown; findings below may reference images not displayed]

FINDINGS: LOWER CHEST: Normal.

HEPATOBILIARY: Normal hepatic contours. No intra- or extrahepatic
biliary dilatation. There is cholelithiasis without acute
inflammation.

PANCREAS: Normal pancreas. No ductal dilatation or peripancreatic
fluid collection.

SPLEEN: Normal.

ADRENALS/URINARY TRACT: The adrenal glands are normal. No
hydronephrosis, nephroureterolithiasis or solid renal mass. The
urinary bladder is normal for degree of distention

STOMACH/BOWEL: There is no hiatal hernia. Normal duodenal course and
caliber. No small bowel dilatation or inflammation. No focal colonic
abnormality. Normal appendix.

VASCULAR/LYMPHATIC: Normal course and caliber of the major abdominal
vessels. No abdominal or pelvic lymphadenopathy.

REPRODUCTIVE: Globular uterus with multiple fibroids.

MUSCULOSKELETAL. No osseous abnormality. There is skin thickening of
the anterior right thigh extending approximately half the length of
the femur. There is an area equal soft tissue density that extends
into the deep soft tissues along the adductor longus muscle (series
3, image 12). There is surgical clips near the adductor longus
insertion site (series 3, image 128), so this area may have been
part of the recent surgery. There is no soft tissue gas. There is a
posterior thigh subcutaneous collection measuring 2.7 cm that is
likely a sebaceous cyst.

OTHER: None.
IMPRESSION: 1. Skin thickening of the anterior right thigh extending
approximately half the length of the femur, with soft tissue density
material extending deep along the adductor longus muscle. It is
unclear how much of this is postsurgical change versus active
infection.
2. Cholelithiasis without acute inflammation.
3. Fibroid uterus.

## 2021-03-23 IMAGING — DX DG CHEST 1V PORT
1 series · 1 of 1 positions shown · non-contrast
Comparison: 09/07/2019 and prior radiographs

CLINICAL DATA: Acute shortness of breath and weakness.

EXAM:
PORTABLE CHEST 1 VIEW

[chest]
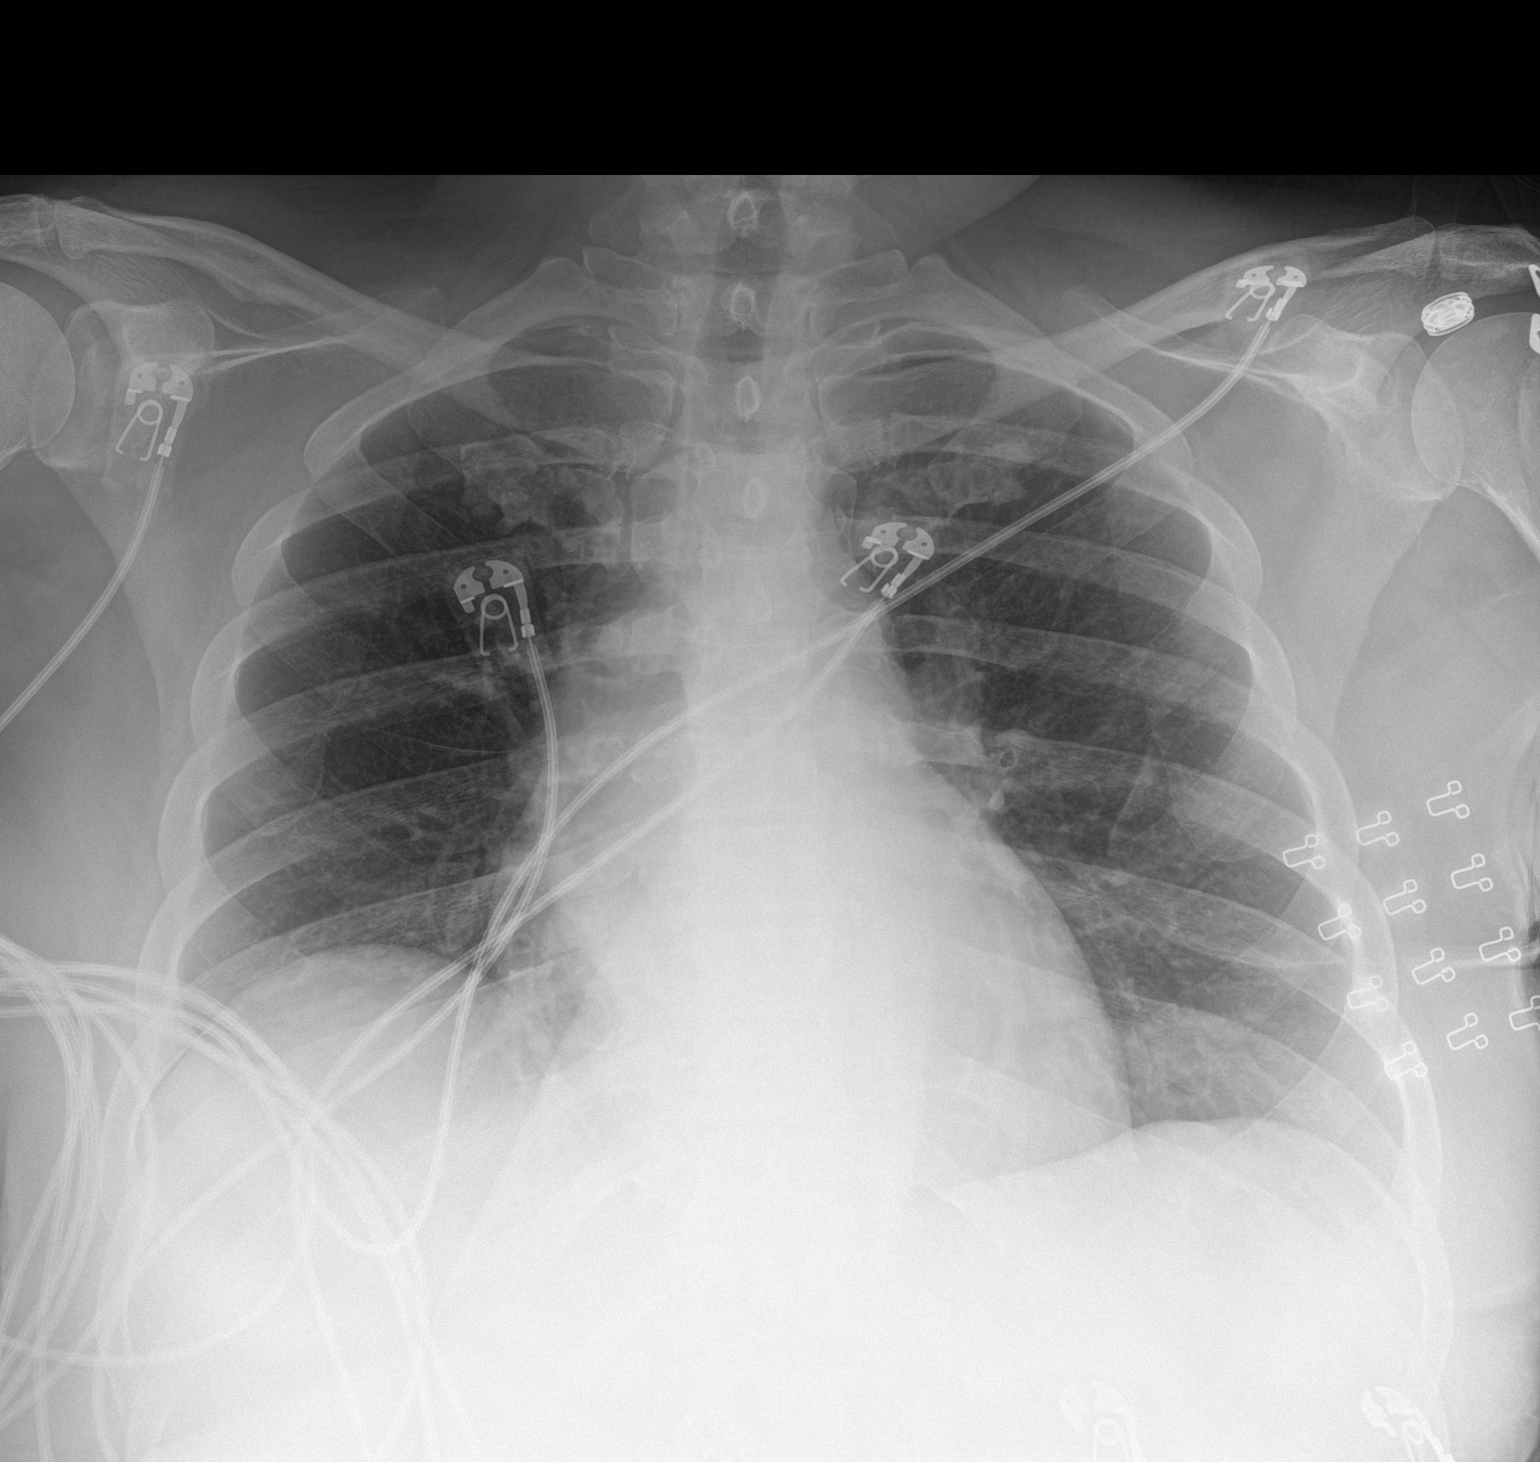

[1 of 1 positions shown; findings below may reference images not displayed]

FINDINGS: The cardiomediastinal silhouette is unremarkable.

Elevated RIGHT hemidiaphragm again noted.

There is no evidence of focal airspace disease, pulmonary edema,
suspicious pulmonary nodule/mass, pleural effusion, or pneumothorax.

No acute bony abnormalities are identified.
IMPRESSION: No active disease.

## 2021-07-22 ENCOUNTER — Ambulatory Visit (INDEPENDENT_AMBULATORY_CARE_PROVIDER_SITE_OTHER): Payer: Medicare Other | Admitting: Internal Medicine

## 2021-07-22 DIAGNOSIS — Z0000 Encounter for general adult medical examination without abnormal findings: Secondary | ICD-10-CM

## 2021-07-22 NOTE — Progress Notes (Deleted)
? ?Subjective:  ? Kimberly PillingRebecca Orozco is a 46 y.o. female who presents for an Initial Medicare Annual Wellness Visit. ? ?Review of Systems    ?*** ?  ? ?   ?Objective:  ?  ?There were no vitals filed for this visit. ?There is no height or weight on file to calculate BMI. ? ? ?  07/21/2020  ?  2:28 PM 04/12/2020  ?  1:51 PM 01/22/2020  ?  7:32 PM 01/22/2020  ?  4:34 PM 01/09/2020  ?  5:56 PM 01/05/2020  ?  2:41 PM 01/02/2020  ?  2:51 PM  ?Advanced Directives  ?Does Patient Have a Medical Advance Directive? No No No No No No No  ?Would patient like information on creating a medical advance directive? No - Patient declined No - Patient declined No - Patient declined No - Patient declined No - Patient declined  No - Patient declined  ? ? ?Current Medications (verified) ?Outpatient Encounter Medications as of 07/22/2021  ?Medication Sig  ?? acetaminophen (TYLENOL) 500 MG tablet Take 1,000 mg by mouth every 6 (six) hours as needed for moderate pain.  ?? ARIPiprazole (ABILIFY) 15 MG tablet Take 1 tablet (15 mg total) by mouth at bedtime for mood  ?? benzoyl peroxide-erythromycin (BENZAMYCIN) gel Apply 1 application topically 2 (two) times daily. (Patient not taking: Reported on 10/27/2020)  ?? chlorthalidone (HYGROTON) 25 MG tablet Take 1 tablet (25 mg total) by mouth daily. (Patient not taking: Reported on 10/27/2020)  ?? hydrochlorothiazide (HYDRODIURIL) 25 MG tablet Take 1 tablet (25 mg total) by mouth daily for hypertension  ? ?No facility-administered encounter medications on file as of 07/22/2021.  ? ? ?Allergies (verified) ?Patient has no known allergies.  ? ?History: ?Past Medical History:  ?Diagnosis Date  ?? AKI (acute kidney injury) (HCC)   ?? Bipolar disorder (HCC)   ?? Hypertension   ? not on medications  ?? Schizophrenia (HCC)   ? ?Past Surgical History:  ?Procedure Laterality Date  ?? APPLICATION OF A-CELL OF EXTREMITY N/A 07/08/2019  ? Procedure: with placement of primatrix AG;  Surgeon: Allena NapoleonPace, Collier S, MD;   [facility];  Service: Plastics;  Laterality: N/A;  ?? APPLICATION OF A-CELL OF EXTREMITY Right 07/22/2019  ? Procedure: placement of primatrix mesh;  Surgeon: Allena NapoleonPace, Collier S, MD;  [facility];  Service: Plastics;  Laterality: Right;  ?? INCISION AND DRAINAGE OF WOUND N/A 06/26/2019  ? Procedure: IRRIGATION AND DEBRIDEMENT ABDOMINAL WALL AND GROIN;  Surgeon: Axel Filleramirez, Armando, MD;  [facility];  Service: General;  Laterality: N/A;  ?? INCISION AND DRAINAGE OF WOUND N/A 07/08/2019  ? Procedure: Debridement of abdominal and groin wound;  Surgeon: Allena NapoleonPace, Collier S, MD;  [facility];  Service: Plastics;  Laterality: N/A;  ?? INCISION AND DRAINAGE PERIRECTAL ABSCESS Right 06/24/2019  ? Procedure: IRRIGATION AND DEBRIDEMENT RIGHT GROIN AND RIGHT BUTTOCK;  Surgeon: Axel Filleramirez, Armando, MD;  [facility];  Service: General;  Laterality: Right;  ?? IRRIGATION AND DEBRIDEMENT OF WOUND WITH SPLIT THICKNESS SKIN GRAFT Right 07/22/2019  ? Procedure: Debridement of right thigh wound;  Surgeon: Allena NapoleonPace, Collier S, MD;  [facility];  Service: Plastics;  Laterality: Right;  ?? IRRIGATION AND DEBRIDEMENT OF WOUND WITH SPLIT THICKNESS SKIN GRAFT Right 10/28/2019  ? Procedure: Debridement of right thigh wound with placement of split thickness skin graft;  Surgeon: Allena NapoleonPace, Collier S, MD;  Location: [facility] OR;  Service: Plastics;  Laterality: Right;  ? ?Family History  ?Family history unknown: Yes  ? ?  Social History  ? ?Socioeconomic History  ?? Marital status: Single  ?  Spouse name: Not on file  ?? Number of children: Not on file  ?? Years of education: Not on file  ?? Highest education level: Not on file  ?Occupational History  ?? Not on file  ?Tobacco Use  ?? Smoking status: Every Day  ?  Types: Cigarettes  ?? Smokeless tobacco: Never  ?? Tobacco comments:  ?  4-5 per day   ?Vaping Use  ?? Vaping Use: Never used  ?Substance and Sexual Activity  ?? Alcohol use: Not Currently  ?? Drug use: Never  ?? Sexual activity: Not on file   ?Other Topics Concern  ?? Not on file  ?Social History Narrative  ?? Not on file  ? ?Social Determinants of Health  ? ?Financial Resource Strain: Not on file  ?Food Insecurity: No Food Insecurity  ?? Worried About Programme researcher, broadcasting/film/video in the Last Year: Never true  ?? Ran Out of Food in the Last Year: Never true  ?Transportation Needs: Not on file  ?Physical Activity: Not on file  ?Stress: Not on file  ?Social Connections: Not on file  ? ? ?Tobacco Counseling ?Ready to quit: Not Answered ?Counseling given: Not Answered ?Tobacco comments: 4-5 per day  ? ? ?Clinical Intake: ? ?  ? ?  ? ?  ? ?  ? ?  ? ?Diabetic?*** ? ?  ? ?  ? ? ?Activities of Daily Living ?   ? View : No data to display.  ?  ?  ?  ? ? ? ?Patient Care Team: ?Dellia Cloud, MD as PCP - General (Internal Medicine) ?Rich Reining as Social Worker ? ?Indicate any recent Medical Services you may have received from other than [facility] providers in the past year (date may be approximate). ? ?   ?Assessment:  ? This is a routine wellness examination for Kimberly Orozco. ? ?Hearing/Vision screen ?No results found. ? ?Dietary issues and exercise activities discussed: ?  ? ? Goals Addressed   ?None ?  ?Depression Screen ? ?  01/18/2021  ?  9:24 AM 07/21/2020  ?  2:29 PM 05/25/2020  ?  9:44 AM 04/12/2020  ?  1:50 PM 02/25/2020  ?  1:15 PM 01/02/2020  ?  2:51 PM 10/08/2019  ?  9:07 PM  ?PHQ 2/9 Scores  ?PHQ - 2 Score 2 0  0  0 2  ?PHQ- 9 Score 4 0  0  0 7  ?Exception Documentation         ?  ? Information is confidential and restricted. Go to Review Flowsheets to unlock data.  ? ?  ?Fall Risk ? ?  07/21/2020  ?  2:28 PM 04/12/2020  ?  1:48 PM 01/02/2020  ?  2:50 PM 09/25/2019  ?  1:53 PM  ?Fall Risk   ?Falls in the past year? 1 1 1    ?Number falls in past yr: 1 1 1  0  ?Injury with Fall? 0 0 0 0  ?Risk for fall due to :  Impaired balance/gait Impaired balance/gait;History of fall(s) History of fall(s);Impaired balance/gait  ?Risk for fall due to: Comment    numbness  ?Follow up   Falls  prevention discussed Falls prevention discussed  ? ? ?FALL RISK PREVENTION PERTAINING TO THE HOME: ? ?Any stairs in or around the home? {YES/NO:21197} ?If so, are there any without handrails? {YES/NO:21197} ?Home free of loose throw rugs in walkways, pet beds, electrical cords, etc? {  YES/NO:21197} ?Adequate lighting in your home to reduce risk of falls? {YES/NO:21197} ? ?ASSISTIVE DEVICES UTILIZED TO PREVENT FALLS: ? ?Life alert? {YES/NO:21197} ?Use of a cane, walker or w/c? {YES/NO:21197} ?Grab bars in the bathroom? {YES/NO:21197} ?Shower chair or bench in shower? {YES/NO:21197} ?Elevated toilet seat or a handicapped toilet? {YES/NO:21197} ? ?TIMED UP AND GO: ? ?Was the test performed? {YES/NO:21197}.  ?Length of time to ambulate 10 feet: *** sec.  ? ?{Appearance of Gait:2101803} ? ?Cognitive Function: ?  ?  ?  ? ?Immunizations ? ?There is no immunization history on file for this patient. ? ?{TDAP status:2101805} ? ?{Flu Vaccine status:2101806} ? ?{Pneumococcal vaccine status:2101807} ? ?{Covid-19 vaccine status:2101808} ? ?Qualifies for Shingles Vaccine? {YES/NO:21197}  ?Zostavax completed {YES/NO:21197}  ?{Shingrix Completed?:2101804} ? ?Screening Tests ?Health Maintenance  ?Topic Date Due  ?? COVID-19 Vaccine (1) Never done  ?? FOOT EXAM  Never done  ?? OPHTHALMOLOGY EXAM  Never done  ?? TETANUS/TDAP  Never done  ?? PAP SMEAR-Modifier  Never done  ?? COLONOSCOPY (Pts 45-66yrs Insurance coverage will need to be confirmed)  Never done  ?? HEMOGLOBIN A1C  03/27/2020  ?? URINE MICROALBUMIN  09/24/2020  ?? INFLUENZA VACCINE  10/11/2021  ?? Hepatitis C Screening  Completed  ?? HIV Screening  Completed  ?? HPV VACCINES  Aged Out  ? ? ?Health Maintenance ? ?Health Maintenance Due  ?Topic Date Due  ?? COVID-19 Vaccine (1) Never done  ?? FOOT EXAM  Never done  ?? OPHTHALMOLOGY EXAM  Never done  ?? TETANUS/TDAP  Never done  ?? PAP SMEAR-Modifier  Never done  ?? COLONOSCOPY (Pts 45-79yrs Insurance coverage will need to be  confirmed)  Never done  ?? HEMOGLOBIN A1C  03/27/2020  ?? URINE MICROALBUMIN  09/24/2020  ? ? ?{Colorectal cancer screening:2101809} ? ?{Mammogram status:21018020} ? ?{Bone Density status:21018021} ? ?Lung Canc

## 2021-07-22 NOTE — Patient Instructions (Signed)

## 2021-07-25 NOTE — Progress Notes (Signed)
CMA attempted to complete annual wellness via telephone, but pt states that she is no longer a patient of [facility] ?

## 2022-02-24 ENCOUNTER — Ambulatory Visit: Payer: Self-pay | Admitting: Licensed Clinical Social Worker

## 2022-02-24 NOTE — Patient Outreach (Signed)
SW removed from care team.  Leeanne Butters, BSW , MSW, LCSW-A Social Worker [facility]  [phone]
